# Patient Record
Sex: Female | Born: 1950 | Race: White | Hispanic: No | Marital: Married | Smoking: Former smoker
Health system: Southern US, Community
[De-identification: ages and names within clinical notes are randomized; demographics above are authoritative.]

## PROBLEM LIST (undated history)

## (undated) DIAGNOSIS — J189 Pneumonia, unspecified organism: Secondary | ICD-10-CM

## (undated) DIAGNOSIS — M545 Low back pain, unspecified: Secondary | ICD-10-CM

## (undated) DIAGNOSIS — K429 Umbilical hernia without obstruction or gangrene: Secondary | ICD-10-CM

## (undated) DIAGNOSIS — I82409 Acute embolism and thrombosis of unspecified deep veins of unspecified lower extremity: Secondary | ICD-10-CM

## (undated) DIAGNOSIS — D649 Anemia, unspecified: Secondary | ICD-10-CM

## (undated) DIAGNOSIS — F419 Anxiety disorder, unspecified: Secondary | ICD-10-CM

## (undated) DIAGNOSIS — J309 Allergic rhinitis, unspecified: Secondary | ICD-10-CM

## (undated) DIAGNOSIS — N952 Postmenopausal atrophic vaginitis: Secondary | ICD-10-CM

## (undated) DIAGNOSIS — J449 Chronic obstructive pulmonary disease, unspecified: Secondary | ICD-10-CM

## (undated) DIAGNOSIS — Z9981 Dependence on supplemental oxygen: Secondary | ICD-10-CM

## (undated) DIAGNOSIS — I10 Essential (primary) hypertension: Secondary | ICD-10-CM

## (undated) HISTORY — DX: Essential (primary) hypertension: I10

## (undated) HISTORY — DX: Umbilical hernia without obstruction or gangrene: K42.9

## (undated) HISTORY — PX: TUBAL LIGATION: SHX77

## (undated) HISTORY — DX: Allergic rhinitis, unspecified: J30.9

## (undated) HISTORY — PX: OTHER SURGICAL HISTORY: SHX169

## (undated) HISTORY — DX: Low back pain, unspecified: M54.50

## (undated) HISTORY — DX: Postmenopausal atrophic vaginitis: N95.2

## (undated) HISTORY — DX: Acute embolism and thrombosis of unspecified deep veins of unspecified lower extremity: I82.409

## (undated) HISTORY — DX: Low back pain: M54.5

## (undated) HISTORY — DX: Chronic obstructive pulmonary disease, unspecified: J44.9

---

## 2008-10-22 ENCOUNTER — Ambulatory Visit (HOSPITAL_BASED_OUTPATIENT_CLINIC_OR_DEPARTMENT_OTHER): Admission: RE | Admit: 2008-10-22 | Discharge: 2008-10-22 | Payer: Self-pay | Admitting: Orthopedic Surgery

## 2008-10-22 ENCOUNTER — Encounter (INDEPENDENT_AMBULATORY_CARE_PROVIDER_SITE_OTHER): Payer: Self-pay | Admitting: Orthopedic Surgery

## 2008-10-28 ENCOUNTER — Encounter (INDEPENDENT_AMBULATORY_CARE_PROVIDER_SITE_OTHER): Payer: Self-pay | Admitting: Orthopedic Surgery

## 2008-10-28 ENCOUNTER — Ambulatory Visit: Admission: RE | Admit: 2008-10-28 | Discharge: 2008-10-28 | Payer: Self-pay | Admitting: Orthopedic Surgery

## 2008-10-29 ENCOUNTER — Ambulatory Visit: Payer: Self-pay | Admitting: Vascular Surgery

## 2010-05-06 LAB — POCT HEMOGLOBIN-HEMACUE: Hemoglobin: 17.2 g/dL — ABNORMAL HIGH (ref 12.0–15.0)

## 2011-02-25 ENCOUNTER — Inpatient Hospital Stay: Payer: Self-pay | Admitting: Internal Medicine

## 2011-02-25 LAB — BASIC METABOLIC PANEL
Calcium, Total: 9.6 mg/dL (ref 8.5–10.1)
Chloride: 109 mmol/L — ABNORMAL HIGH (ref 98–107)
Co2: 24 mmol/L (ref 21–32)
Osmolality: 287 (ref 275–301)
Potassium: 4 mmol/L (ref 3.5–5.1)

## 2011-02-25 LAB — CBC WITH DIFFERENTIAL/PLATELET
Basophil %: 0.4 %
Eosinophil #: 0.8 10*3/uL — ABNORMAL HIGH (ref 0.0–0.7)
Eosinophil %: 7.7 %
HGB: 16.3 g/dL — ABNORMAL HIGH (ref 12.0–16.0)
Lymphocyte %: 12.8 %
Monocyte %: 6.2 %
Neutrophil %: 72.9 %
RBC: 5.22 10*6/uL — ABNORMAL HIGH (ref 3.80–5.20)

## 2011-02-26 LAB — CBC WITH DIFFERENTIAL/PLATELET
Eosinophil %: 0 %
Lymphocyte %: 6.4 %
MCH: 31.2 pg (ref 26.0–34.0)
Monocyte #: 0.1 10*3/uL (ref 0.0–0.7)
Neutrophil %: 93 %
Platelet: 238 10*3/uL (ref 150–440)
RBC: 5.05 10*6/uL (ref 3.80–5.20)
WBC: 8.7 10*3/uL (ref 3.6–11.0)

## 2011-02-26 LAB — BASIC METABOLIC PANEL
Anion Gap: 11 (ref 7–16)
Creatinine: 0.58 mg/dL — ABNORMAL LOW (ref 0.60–1.30)
EGFR (African American): >60
EGFR (Non-African Amer.): >60
Osmolality: 292 (ref 275–301)
Sodium: 146 mmol/L — ABNORMAL HIGH (ref 136–145)

## 2014-05-24 NOTE — H&P (Signed)
PATIENT NAME:  Erin Morrison, [name] MR#:  [id number] DATE OF BIRTH:  11-Apr-1950  DATE OF ADMISSION:  02/25/2011  CHIEF COMPLAINT: Dyspnea.  HISTORY OF PRESENT ILLNESS:  Ms. Ashok NorrisFlinchum is a 64 year old white female with a history of chronic obstructive pulmonary disease secondary to ongoing tobacco abuse with PFTs in 2005 who presents with a two-day history of increasing dyspnea. The patient states that she was diagnosed with influenza about three weeks ago by her primary care physician and was treated with prednisone and albuterol but did not take Tamiflu. Her breathing improved on the prednisone, but once the prednisone taper finished she continued to have a cough, at times productive and at times nonproductive. She developed acute shortness of breath yesterday and came to the ER today when the albuterol that she used at home was not alleviating her symptoms. In the ED, she had multiple nebulizer treatments for room air saturations in the mid 80s, and after several albuterol treatments continues to be dyspneic with a 90% oxygen saturation on room air. She is being admitted for more aggressive treatment.   PRIMARY CARE PHYSICIAN:  Her primary care doctor is Dr. Anna Genreonroy in [place].  PAST MEDICAL HISTORY: Notable only for chronic obstructive pulmonary disease. PFTs were done in 2005 by Dr. Adonis Hugueninimeo, which confirmed mild to moderate obstructive airway disease.   MEDICATIONS: Her only medications are Aleve, which she takes occasionally for osteoarthritis.   PAST SURGICAL HISTORY: Only notable for right knee surgery for cystectomy.   ALLERGIES: She has no known drug allergies.  HOSPITALIZATIONS: No recent or prior hospitalizations.   FAMILY HISTORY: Notable for coronary artery disease. Her father dropped dead of a massive heart attack in his 6250s and her mother died of chronic obstructive pulmonary disease.  SOCIAL HISTORY: She is married. She is a lifelong smoker, having quit for three years back in 2005, but  now smoking again 1 pack a day. 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 She does not exercise or drink alcohol.   REVIEW OF SYSTEMS: Positive for fatigue for the last three weeks. No weight changes. No fevers. No recent visual changes. No history of postnasal drip or sinus pain. Positive history of cough with wheezing, dyspnea, and history of chronic obstructive pulmonary disease. She denies chest pain, orthopnea, edema, and dyspnea on exertion. No palpitations or syncope. No history of hypertension. No nausea, vomiting, diarrhea, or bowel habit changes. She has polyuria, 1 to 2 voids per night. No history of anemia or easy bruising. No chronic arthritis-type pain. She has no history of numbness, weakness, or dysarthria. No history of migraines, cerebrovascular accidents, or transient ischemic attacks. She has no history of anxiety, insomnia, or depression.   PHYSICAL EXAMINATION:  GENERAL: This is a middle-aged woman in no apparent distress who is slightly dyspneic at rest. Not using any accessory muscles to breathe.   VITAL SIGNS: Initial blood pressure is 197/97, repeat was 172/78, pulse 82. Rhythm is regular. She is sating 90% currently on room air. Her temperature is 98.3.   HEENT: Pupils are equal, round, and reactive to light. Extraocular movements are intact. Sclerae are anicteric.  Oropharynx is benign.   NECK: Supple without lymphadenopathy, JVD, thyromegaly, or carotid bruits.   LUNGS: Notable for decreased breath sounds bilaterally in all fields with occasional expiratory wheezes. No egophony is noted.   CARDIOVASCULAR: Regular rate and rhythm with no murmurs, rubs, or gallops. She has palpable pedal pulses and no lower extremity edema.   ABDOMEN: Soft, nontender, and  nondistended with good bowel sounds and no evidence of hepatosplenomegaly.   MUSCULOSKELETAL: Nonfocal. She has no joint effusions and good range of motion.   SKIN: Skin is warm and dry without  lesions.   LYMPH: There is no cervical, axillary, inguinal, or supraclavicular lymphadenopathy.   NEUROLOGIC: Grossly nonfocal.   PSYCH: She is alert and oriented to person, place, and time.   ADMISSION LABORATORY DATA: Sodium 144, potassium 4, chloride 109, bicarbonate 24, BUN 12, creatinine 0.75, glucose 101, white count 10.4, hemoglobin 16.3, platelets 251. Twelve-lead EKG is normal sinus rhythm. Chest x-ray is negative for infiltrate.   ASSESSMENT AND PLAN: 1. Hypoxic respiratory failure secondary to chronic obstructive pulmonary disease exacerbation, likely brought on by recent influenza. Admit for IV Solu-Medrol, bronchodilators, and empiric antibiotics.  2. Hypertension, new onset. We will follow blood pressure for now as she has no history of hypertension. This may be due to bronchodilators versus steroids that were given here.  3. Chronic obstructive pulmonary disease with ongoing tobacco use. The patient continues to smoke due to stressors. She was counseled during the ER visit about the need to quit smoking given her chronic obstructive pulmonary disease. She is in the contemplative stages.  4. CODE STATUS: She is FULL CODE.  TIME SPENT: Approximately 40 minutes.    ____________________________ Duncan Dull, MD tt:bjt D: 02/25/2011 23:22:02 ET T: 02/26/2011 10:53:28 ET JOB#: [id number]  cc: Duncan Dull, MD, <Dictator> Lonie Peak, MD Duncan Dull MD ELECTRONICALLY SIGNED 03/03/2011 18:22

## 2014-05-24 NOTE — Consult Note (Signed)
Chief Complaint:   Subjective/Chief Complaint dyspnea   Assessment/Plan:  Assessment/Plan:   Assessment 64 yo with recent diagnosis of influenza admitted with persistent dyspnea and hypoxia after multiple bronchodilator treatments.   1) Acute resp failure:  02, IV solumedrol, ceftriazoner/azithro, bronchodilators.  2) Hypertension: new onset.  amlodipine   Electronic Signatures: Duncan Dullullo, Charlot Gouin (MD)  (Signed 26-Jan-13 23:26)  Authored: Chief Complaint, Assessment/Plan   Last Updated: 26-Jan-13 23:26 by Duncan Dullullo, Ozan Maclay (MD)

## 2014-05-24 NOTE — Discharge Summary (Signed)
PATIENT NAME:  Erin Morrison, Erin Morrison MR#:  [id number] DATE OF BIRTH:  01-Jul-1950  DATE OF ADMISSION:  02/25/2011 DATE OF DISCHARGE:  02/27/2011  DISCHARGE DIAGNOSES: 1. Acute bronchitis. 2. Chronic obstructive pulmonary disease exacerbation.  3. Tobacco abuse.  4. New onset hypertension.   DISCHARGE MEDICATIONS:  1. Spiriva one capsule inhalation daily.  2. Amlodipine 5 mg p.o. daily.  3. Albuterol MDI 2 puffs q.6 hours p.r.n. for wheezing.  4. Prednisone Dosepak.  5. Levaquin 750 mg p.o. daily for two days.  OXYGEN: 2 liters via nasal cannula.    DIET: Low sodium diet.   CONSULTATIONS: None.   HOSPITAL COURSE: The patient is a 64 year old female patient who has been seeing Dr. Anna Genreonroy in [place] came in because of trouble breathing. The patient had the flu three weeks before and was treated with prednisone and antibiotics. The patient did not take Tamiflu. The patient was admitted with hypoxia with sats of around 80%. After several breathing treatments, the patient's sats remained 80%. The patient remained quite dyspneic with sats of around 90 after several breathing treatments. She was admitted for chronic obstructive pulmonary disease exacerbation with acute bronchitis. Chest x-ray did not show any pneumonia, last look fine. The patient's EKG showed normal sinus rhythm. She was started on IV Solu-Medrol along with Levaquin, bronchodilators, and the patient showed considerable improvement in her symptoms and her wheezing has also decreased. The patient's chest x-ray did not show any acute infiltrate. Her electrolytes were within normal limits and white count was 10.4. The patient has been on steroids which helped her a lot. Today she wanted to go home and so I gave a prescription for steroids along with inhaler and also Spiriva. The patient's oxygen saturation dropped to 87% on exertion on room air and 92% on room air at rest so she is going to be discharged with oxygen via nasal cannula all the  time at 2 liters and follow-up with Dr. Anna Genreonroy in [place] in 1 to 2 weeks regarding further continuation of the oxygen. The patient had no history of high blood pressure before but in the hospital has been around 170/78 so she was started on Norvasc and this morning her blood pressure is 118/74 with heart rate of 80 so she was given prescription for Norvasc as well.   TOTAL TIME SPENT ON DISCHARGE PREPARATION: More than 30 minutes.  ____________________________ Katha HammingSnehalatha Chianti Goh, MD sk:drc D: 02/27/2011 13:54:08 ET T: 02/27/2011 14:07:56 ET JOB#: [id number]  cc: Katha HammingSnehalatha Abagael Kramm, MD, <Dictator> Lonie PeakNathan Conroy, MD Katha HammingSNEHALATHA Ladonte Verstraete MD ELECTRONICALLY SIGNED 03/14/2011 14:53

## 2015-06-08 ENCOUNTER — Other Ambulatory Visit (INDEPENDENT_AMBULATORY_CARE_PROVIDER_SITE_OTHER): Payer: 59

## 2015-06-08 ENCOUNTER — Telehealth: Payer: Self-pay | Admitting: Pulmonary Disease

## 2015-06-08 ENCOUNTER — Encounter: Payer: Self-pay | Admitting: Pulmonary Disease

## 2015-06-08 ENCOUNTER — Ambulatory Visit (INDEPENDENT_AMBULATORY_CARE_PROVIDER_SITE_OTHER): Payer: Managed Care, Other (non HMO) | Admitting: Pulmonary Disease

## 2015-06-08 VITALS — BP 140/86 | HR 86 | Ht 62.0 in | Wt 169.2 lb

## 2015-06-08 DIAGNOSIS — I10 Essential (primary) hypertension: Secondary | ICD-10-CM

## 2015-06-08 DIAGNOSIS — J410 Simple chronic bronchitis: Secondary | ICD-10-CM

## 2015-06-08 DIAGNOSIS — Z92241 Personal history of systemic steroid therapy: Secondary | ICD-10-CM

## 2015-06-08 DIAGNOSIS — J3089 Other allergic rhinitis: Secondary | ICD-10-CM | POA: Diagnosis not present

## 2015-06-08 DIAGNOSIS — J449 Chronic obstructive pulmonary disease, unspecified: Secondary | ICD-10-CM

## 2015-06-08 DIAGNOSIS — J309 Allergic rhinitis, unspecified: Secondary | ICD-10-CM | POA: Insufficient documentation

## 2015-06-08 DIAGNOSIS — R0789 Other chest pain: Secondary | ICD-10-CM

## 2015-06-08 DIAGNOSIS — J42 Unspecified chronic bronchitis: Secondary | ICD-10-CM | POA: Insufficient documentation

## 2015-06-08 HISTORY — DX: Essential (primary) hypertension: I10

## 2015-06-08 HISTORY — DX: Personal history of systemic steroid therapy: Z92.241

## 2015-06-08 HISTORY — DX: Chronic obstructive pulmonary disease, unspecified: J44.9

## 2015-06-08 LAB — CBC
HEMATOCRIT: 47.4 % — AB (ref 36.0–46.0)
HEMOGLOBIN: 16 g/dL — AB (ref 12.0–15.0)
MCHC: 33.8 g/dL (ref 30.0–36.0)
MCV: 91.1 fl (ref 78.0–100.0)
Platelets: 348 10*3/uL (ref 150.0–400.0)
RBC: 5.21 Mil/uL — ABNORMAL HIGH (ref 3.87–5.11)
RDW: 13.7 % (ref 11.5–15.5)
WBC: 11.9 10*3/uL — AB (ref 4.0–10.5)

## 2015-06-08 LAB — TROPONIN I: TNIDX: 0 ug/L (ref 0.00–0.06)

## 2015-06-08 MED ORDER — AEROCHAMBER MV MISC: Status: DC

## 2015-06-08 MED ORDER — MONTELUKAST SODIUM 10 MG PO TABS: 10.0000 mg | ORAL_TABLET | Freq: Every day | ORAL | Status: DC

## 2015-06-08 NOTE — Patient Instructions (Signed)
   Continue using your Symbicort & Spiriva as prescribed. Remember to use your spacer with your Symbicort.  We will review your test results at your next appointment.  Seek immediate medical attention if your chest pain does not resolve or worsens in anyway.  I will see you back in 4-6 weeks.  TESTS ORDERED: 1. Full pulmonary function testing before next appointment 2. 6 minute walk test on room air before next appointment 3. Serum lab work today

## 2015-06-08 NOTE — Progress Notes (Signed)
Subjective:    Patient ID: Erin Morrison, female    DOB: 1951/01/09, 65 y.o.   MRN: [id number]  HPI Reports she was diagnosed with COPD in 2007. She has been on Symbicort & Spiriva since 2012. She feels they may be helping her breathing some. She uses her rescue inhaler 3-4 times daily lately. She reports she has a cough primarily in the morning and it is productive of a clear to yellow mucus. She does have intermittent wheezing. No nocturnal awakenings with coughing or wheezing. She reports second-hand smoke, fresh cut grass, and chemical fumes will make her breathing worse. Denies any sinus drainage but does have congestion. She reports chronic chest pain. She describes the pain as a "pressure" on her chest. Denies any pleurisy. It can occur at rest as well as with exertion. Denies any recurrent dyspepsia or reflux. No morning brash water taste. No syncope or near syncope. Occasional headaches, especially in the morning. She has been on Prednisone since September off an on but she has "chest tightness" as she starts to wean off the Prednisone. She gets frequent bronchitis.  Review of Systems No rashes or bruising. No dysuria or hematuria. A pertinent 14 point review of systems is negative except as per the history of presenting illness.  No Known Allergies  Current Outpatient Prescriptions on File Prior to Visit  Medication Sig Dispense Refill  . albuterol (PROVENTIL HFA;VENTOLIN HFA) 108 (90 Base) MCG/ACT inhaler Inhale into the lungs every 6 (six) hours as needed for wheezing or shortness of breath.    Marland Kitchen albuterol (PROVENTIL) (2.5 MG/3ML) 0.083% nebulizer solution Take 2.5 mg by nebulization every 6 (six) hours as needed for wheezing or shortness of breath.    Marland Kitchen aspirin 81 MG tablet Take 81 mg by mouth daily.    . budesonide-formoterol (SYMBICORT) 160-4.5 MCG/ACT inhaler Inhale 2 puffs into the lungs 2 (two) times daily.    . cetirizine (ZYRTEC) 10 MG tablet Take 10 mg by mouth daily.    Marland Kitchen  esomeprazole (NEXIUM) 40 MG capsule Take 40 mg by mouth daily at 12 noon.    . tiotropium (SPIRIVA) 18 MCG inhalation capsule Place 18 mcg into inhaler and inhale daily.     No current facility-administered medications on file prior to visit.    Past Medical History  Diagnosis Date  . COPD (chronic obstructive pulmonary disease) (HCC)   . Hypertension   . Low back pain   . Umbilical hernia   . Acute DVT (deep venous thrombosis) (HCC)   . Allergic rhinitis   . Atrophic vaginitis     Past Surgical History  Procedure Laterality Date  . Arthroscopic knee surgery Right   . Tubal ligation      Family History  Problem Relation Age of Onset  . Emphysema Mother   . Cirrhosis Father     Social History   Social History  . Marital Status: Married    Spouse Name: N/A  . Number of Children: N/A  . Years of Education: N/A   Occupational History  . Retired    Social History Main Topics  . Smoking status: Former Smoker -- 1.50 packs/day for 46 years    Types: Cigarettes    Start date: 01/31/1964    Quit date: 01/30/2010  . Smokeless tobacco: Never Used  . Alcohol Use: No  . Drug Use: No  . Sexual Activity: Not Asked   Other Topics Concern  . None   Social History Narrative   [facility]:  Originally from [place] Moved to [place] in 1986. Has [place] to [place] 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 Has an outside dog. No bird or mold exposure. She does have a musty smell in her bedroom.       Objective:   Physical Exam BP 140/86 mmHg  Pulse 86  Ht 5\' 2"  (1.575 m)  Wt 169 lb 3.2 oz (76.749 kg)  BMI 30.94 kg/m2  SpO2 92% General:  Awake. Alert. No acute distress. Integument:  Warm & dry. No rash on exposed skin. No bruising. Lymphatics:  No appreciated cervical or supraclavicular lymphadenoapthy. HEENT:  Moist mucus membranes. No oral ulcers. No scleral injection or icterus. Minimal nasal turbinate  swelling. Cardiovascular:  Regular rate. No edema. No appreciable JVD.  Pulmonary:  Good aeration & clear to auscultation bilaterally. Symmetric chest wall expansion. No accessory muscle use. Abdomen: Soft. Normal bowel sounds. Nondistended. Grossly nontender. Musculoskeletal:  Normal bulk and tone. Hand grip strength 5/5 bilaterally. No joint deformity or effusion appreciated. Neurological:  CN 2-12 grossly in tact. No meningismus. Moving all 4 extremities equally. Symmetric brachioradialis deep tendon reflexes. Psychiatric:  Mood and affect congruent. Speech normal rhythm, rate & tone.   IMAGING CXR PA/LAT 02/22/15 (per radiologist): Bibasilar scarring or atelectasis. Heart normal in size. No effusion or bony abnormality.  CXR PA/LAT 02/25/11 (per radiologist): Lungs are clear with cardiac silhouette & visualized bony skeleton unremarkable. No evidence of acute cardiopulmonary disease.   CARDIAC EKG 06/08/15 (personally reviewed by me): Normal sinus rhythm. QTC 406 ms. Upright T waves & no ST segment depression to suggest ischemia.  LABS 02/26/11 CBC: 8.7/15.8/47.2/238 BMP: 146/3.7/112/23/12/0.58/137/9.3    Assessment & Plan:  65 year old female remotely diagnosed with COPD. With her history of recurrent bronchitis I do question whether or not she may have an underlying immune deficiency. Certainly her ongoing chest discomfort is concerning for possible cardiac etiology with her long-standing history of tobacco use. This will need to be investigated further with serum lab work. I do believe there may be some allergic component to her symptoms with her history of allergic rhinitis. I will attempt to increase treatment for allergic rhinitis as well as further investigate her pulmonary and immune function before further adjusting her inhaled medications at this time. I instructed the patient contact my office if she had any new breathing problems or questions before next appointment.  1. COPD:  Checking for pulmonary function testing & 6 minute walk test before next appointment. Continuing patient on Symbicort & Spiriva as prescribed. Providing patient with a spacer to use with Symbicort. Screening for alpha-1 antitrypsin deficiency. 2. Chronic bronchitis: Checking quantitative immunoglobulin panel. Also checking full pulmonary function testing before next appointment. 3. History of steroid therapy: Continuing on current dose of prednisone 10 mg by mouth daily. Plan to readdress need for prednisone at next appointment depending upon test results. 4. Allergic rhinitis: Starting singular 10 mg by mouth daily at bedtime. Checking serum IgE & RAST panel. Also checking serum CBC. 5. Atypical chest pain: Checking troponin I. Defer to primary care physician on further workup if this is negative. 6. Follow-up: Patient to return to clinic in 4-6 weeks.  Donna ChristenJennings E. Jamison NeighborNestor, M.D. [facility] Pager:  [phone] After 3pm or if no response, call [phone] 12:07 PM 06/08/2015

## 2015-06-08 NOTE — Telephone Encounter (Signed)
IMAGING CXR PA/LAT 02/25/11 (per radiologist): Lungs are clear with cardiac silhouette & visualized bony skeleton unremarkable. No evidence of acute cardiopulmonary disease.   LABS 02/26/11 CBC: 8.7/15.8/47.2/238 BMP: 146/3.7/112/23/12/0.58/137/9.3

## 2015-06-09 LAB — RESPIRATORY ALLERGY PROFILE REGION II ~~LOC~~
Allergen, Cedar tree, t12: <0.1 kU/L
Allergen, D pternoyssinus,d7: <0.1 kU/L
Allergen, Mouse Urine Protein, e78: <0.1 kU/L
Allergen, Oak,t7: <0.1 kU/L
Box Elder IgE: <0.1 kU/L
Cat Dander: <0.1 kU/L
D. farinae: <0.1 kU/L
Dog Dander: <0.1 kU/L
Elm IgE: <0.1 kU/L
IGE (IMMUNOGLOBULIN E), SERUM: 15 kU/L (ref <115)
Johnson Grass: <0.1 kU/L
Rough Pigweed  IgE: <0.1 kU/L
Sheep Sorrel IgE: <0.1 kU/L

## 2015-06-09 LAB — IGG, IGA, IGM
IgA: 248 mg/dL (ref 69–380)
IgG (Immunoglobin G), Serum: 648 mg/dL — ABNORMAL LOW (ref 690–1700)
IgM, Serum: 126 mg/dL (ref 52–322)

## 2015-06-12 LAB — ALPHA-1 ANTITRYPSIN PHENOTYPE: A1 ANTITRYPSIN: 169 mg/dL (ref 83–199)

## 2015-06-15 NOTE — Progress Notes (Signed)
Quick Note:  Attempted to call pt. Phone rang with no VM. ______

## 2015-06-23 ENCOUNTER — Encounter: Payer: Self-pay | Admitting: *Deleted

## 2015-06-30 ENCOUNTER — Telehealth: Payer: Self-pay | Admitting: Pulmonary Disease

## 2015-06-30 NOTE — Telephone Encounter (Signed)
Results have been explained to patient, pt expressed understanding. Nothing further needed.  Notes Recorded by Roslynn AmbleJennings E Nestor, MD on 06/08/2015 at 4:20 PM Please call the patient and let her know that her blood work does not show any anemia or damage to her heart at this time. Thanks.

## 2015-07-13 ENCOUNTER — Ambulatory Visit (HOSPITAL_COMMUNITY)
Admission: RE | Admit: 2015-07-13 | Discharge: 2015-07-13 | Disposition: A | Discharge status: Home / Self Care | Payer: Managed Care, Other (non HMO) | Source: Ambulatory Visit | Attending: Pulmonary Disease | Admitting: Pulmonary Disease

## 2015-07-13 DIAGNOSIS — J410 Simple chronic bronchitis: Secondary | ICD-10-CM

## 2015-07-13 DIAGNOSIS — Z92241 Personal history of systemic steroid therapy: Secondary | ICD-10-CM

## 2015-07-13 DIAGNOSIS — J449 Chronic obstructive pulmonary disease, unspecified: Secondary | ICD-10-CM | POA: Diagnosis present

## 2015-07-13 DIAGNOSIS — J3089 Other allergic rhinitis: Secondary | ICD-10-CM | POA: Diagnosis present

## 2015-07-13 LAB — PULMONARY FUNCTION TEST
DL/VA % pred: 69 %
DL/VA: 3.17 ml/min/mmHg/L
DLCO UNC % PRED: 62 %
DLCO UNC: 13.52 ml/min/mmHg
FEF 25-75 PRE: 0.92 L/s
FEF 25-75 Post: 0.97 L/sec
FEF2575-%Change-Post: 5 %
FEF2575-%PRED-PRE: 47 %
FEF2575-%Pred-Post: 50 %
FEV1-%CHANGE-POST: 1 %
FEV1-%PRED-POST: 67 %
FEV1-%Pred-Pre: 66 %
FEV1-POST: 1.46 L
FEV1-Pre: 1.45 L
FEV1FVC-%CHANGE-POST: -9 %
FEV1FVC-%Pred-Pre: 90 %
FEV6-%Change-Post: 8 %
FEV6-%PRED-PRE: 76 %
FEV6-%Pred-Post: 83 %
FEV6-PRE: 2.09 L
FEV6-Post: 2.27 L
FEV6FVC-%CHANGE-POST: -2 %
FEV6FVC-%PRED-PRE: 104 %
FEV6FVC-%Pred-Post: 101 %
FVC-%Change-Post: 12 %
FVC-%Pred-Post: 82 %
FVC-%Pred-Pre: 73 %
FVC-PRE: 2.09 L
FVC-Post: 2.34 L
POST FEV1/FVC RATIO: 63 %
POST FEV6/FVC RATIO: 97 %
Pre FEV1/FVC ratio: 69 %
Pre FEV6/FVC Ratio: 100 %
RV % PRED: 153 %
RV: 3.12 L
TLC % pred: 117 %
TLC: 5.56 L

## 2015-07-13 MED ORDER — ALBUTEROL SULFATE (2.5 MG/3ML) 0.083% IN NEBU
2.5000 mg | INHALATION_SOLUTION | Freq: Once | RESPIRATORY_TRACT | Status: AC
  Administered 2015-07-13: 2.5 mg via RESPIRATORY_TRACT

## 2015-07-21 ENCOUNTER — Ambulatory Visit (INDEPENDENT_AMBULATORY_CARE_PROVIDER_SITE_OTHER): Payer: Managed Care, Other (non HMO) | Admitting: Pulmonary Disease

## 2015-07-21 ENCOUNTER — Encounter: Payer: Self-pay | Admitting: Pulmonary Disease

## 2015-07-21 VITALS — BP 124/66 | HR 84 | Ht 62.0 in | Wt 172.8 lb

## 2015-07-21 DIAGNOSIS — J449 Chronic obstructive pulmonary disease, unspecified: Secondary | ICD-10-CM

## 2015-07-21 DIAGNOSIS — R05 Cough: Secondary | ICD-10-CM | POA: Diagnosis not present

## 2015-07-21 DIAGNOSIS — Z92241 Personal history of systemic steroid therapy: Secondary | ICD-10-CM | POA: Diagnosis not present

## 2015-07-21 DIAGNOSIS — R059 Cough, unspecified: Secondary | ICD-10-CM

## 2015-07-21 DIAGNOSIS — J309 Allergic rhinitis, unspecified: Secondary | ICD-10-CM

## 2015-07-21 MED ORDER — DOXYCYCLINE HYCLATE 100 MG PO TABS: 100.0000 mg | ORAL_TABLET | Freq: Two times a day (BID) | ORAL | Status: DC

## 2015-07-21 NOTE — Progress Notes (Signed)
PFT 07/13/15: FVC 2.09 L (73%) FEV1 1.45 L (66%) FEV1/FVC 0.69 FEF 25-75 0.92 L (47%) positive bronchodilator response TLC 5.56 liters (117%) RV 153% ERV 37% DLCO uncorrected 62%  6MWT 07/21/15:  Walked 372 meters / Baseline Sat 94% on RA / Nadir Sat 94% on RA  LABS 06/08/15 Tropoinin I:  0.00 IgG: 648 IgA: 248 IgM: 126 IgE: 15 RAST Panel: Negative Alpha-1 antitrypsin: MM (169) CBC: 11.9/16.0/47.4/348

## 2015-07-21 NOTE — Progress Notes (Signed)
Subjective:    Patient ID: Erin Morrison, female    DOB: Aug 18, 1950, 65 y.o.   MRN: [id number]  C.C.:  Follow-up for Moderate COPD, Chronic Steroid Therapy, & Allergic Rhinitis.  HPI Moderate COPD: Patient on Symbicort & Spiriva. Patient provided a spacer to use with her Symbicort at last appointment. Since last appointment she has been producing more phlegm. She reports starting Friday she has produced yellow to clear phlegm. She reports 3 tablespoons daily. She has been wheezing more. She reports intermittent dypspea. She is using her rescue inhaler 1-2 times daily and feels it may help. She is doing a nebulizer treatment twice daily and does feel this helps as well.   Chronic Steroid Therapy: Patient on prednisone 10 mg by mouth daily. Patient has been on and off of prednisone since September 2016.  Allergic Rhinitis:  Started on Singulair at last appointment. She does feel it helped her sinuses. Also taking Zyrtec. She reports she is sneezing more. She does have post nasal drainage. She has also noticed more sinus congestion.   Review of Systems No fever, chills, or sweats. No chest pain, tightness, or pressure. No nausea, emesis, or abdominal pain except with coughing.   No Known Allergies  Current Outpatient Prescriptions on File Prior to Visit  Medication Sig Dispense Refill  . albuterol (PROVENTIL HFA;VENTOLIN HFA) 108 (90 Base) MCG/ACT inhaler Inhale into the lungs every 6 (six) hours as needed for wheezing or shortness of breath.    Marland Kitchen albuterol (PROVENTIL) (2.5 MG/3ML) 0.083% nebulizer solution Take 2.5 mg by nebulization every 6 (six) hours as needed for wheezing or shortness of breath.    Marland Kitchen aspirin 81 MG tablet Take 81 mg by mouth daily.    . budesonide-formoterol (SYMBICORT) 160-4.5 MCG/ACT inhaler Inhale 2 puffs into the lungs 2 (two) times daily.    . cetirizine (ZYRTEC) 10 MG tablet Take 10 mg by mouth daily.    Marland Kitchen esomeprazole (NEXIUM) 40 MG capsule Take 40 mg by mouth  daily at 12 noon.    . montelukast (SINGULAIR) 10 MG tablet Take 1 tablet (10 mg total) by mouth at bedtime. 30 tablet 3  . predniSONE (DELTASONE) 10 MG tablet Take 10 mg by mouth daily.  0  . Spacer/Aero-Holding Chambers (AEROCHAMBER MV) inhaler Use as instructed 1 each 0  . tiotropium (SPIRIVA) 18 MCG inhalation capsule Place 18 mcg into inhaler and inhale daily.     No current facility-administered medications on file prior to visit.    Past Medical History  Diagnosis Date  . COPD (chronic obstructive pulmonary disease) (HCC)   . Hypertension   . Low back pain   . Umbilical hernia   . Acute DVT (deep venous thrombosis) (HCC)   . Allergic rhinitis   . Atrophic vaginitis     Past Surgical History  Procedure Laterality Date  . Arthroscopic knee surgery Right   . Tubal ligation      Family History  Problem Relation Age of Onset  . Emphysema Mother   . Cirrhosis Father     Social History   Social History  . Marital Status: Married    Spouse Name: N/A  . Number of Children: N/A  . Years of Education: N/A   Occupational History  . Retired    Social History Main Topics  . Smoking status: Former Smoker -- 1.50 packs/day for 46 years    Types: Cigarettes    Start date: 01/31/1964    Quit date: 01/30/2010  . Smokeless  tobacco: Never Used  . Alcohol Use: No  . Drug Use: No  . Sexual Activity: Not Asked   Other Topics Concern  . None   Social History Narrative   [facility]:   Originally from [place] Moved to [place] in 1986. [place] [facility] [place]. 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 Has an outside dog. No bird or mold exposure. She does have a musty smell in her bedroom.       Objective:   Physical Exam BP 124/66 mmHg  Pulse 84  Ht 5\' 2"  (1.575 m)  Wt 172 lb 12.8 oz (78.382 kg)  BMI 31.60 kg/m2  SpO2 93% General:  Awake. Alert. No distress. Integument:  Warm & dry. No rash on exposed skin.  Lymphatics:  No  appreciated cervical or supraclavicular lymphadenoapthy. HEENT:  Moist mucus membranes. No oral ulcers. Mild bilateral nasal turbinate swelling. Cardiovascular:  Regular rate. No edema. Normal S1 & S2.  Pulmonary:  Good aeration bilaterally. Coarse end expiratory wheeze. Speaking in complete sentences. Abdomen: Soft. Normal bowel sounds. Nontender.  PFT 07/13/15: FVC 2.09 L (73%) FEV1 1.45 L (66%) FEV1/FVC 0.69 FEF 25-75 0.92 L (47%) positive bronchodilator response TLC 5.56 liters (117%) RV 153% ERV 37% DLCO uncorrected 62%  6MWT 07/21/15: Walked 372 meters / Baseline Sat 94% on RA / Nadir Sat 94% on RA  IMAGING CXR PA/LAT 02/22/15 (per radiologist): Bibasilar scarring or atelectasis. Heart normal in size. No effusion or bony abnormality.  CXR PA/LAT 02/25/11 (per radiologist): Lungs are clear with cardiac silhouette & visualized bony skeleton unremarkable. No evidence of acute cardiopulmonary disease.   CARDIAC EKG 06/08/15 (previously reviewed by me): Normal sinus rhythm. QTC 406 ms. Upright T waves & no ST segment depression to suggest ischemia.  LABS 06/08/15 Tropoinin I: 0.00 IgG: 648 IgA: 248 IgM: 126 IgE: 15 RAST Panel: Negative Alpha-1 antitrypsin: MM (169) CBC: 11.9/16.0/47.4/348  02/26/11 CBC: 8.7/15.8/47.2/238 BMP: 146/3.7/112/23/12/0.58/137/9.3    Assessment & Plan:  65 year old female with moderate COPD, chronic steroid therapy, & allergic rhinitis.patient's spirometry shows a significant bronchodilator response today raising the question of adequate drug delivery. I do feel that her cough today is likely multifactorial but given the color and volume changes I feel that a short course of antibiotic therapy would be of benefit. I do feel the patient needs to wean her steroid therapy and we will readdress this at follow-up appointment as she clinically improves. I'm also trying the patient on the Spiriva Respimat device to determine whether or not there may be some  improvement in drug delivery over powder medication to maximize her bronchodilation.  1. Cough: Checking sputum culture for AFB, fungus, and bacteria. Empiric doxycycline 100 mg by mouth twice a day 7 days. Consider ran his own 40 mg by mouth daily 4 days if cough persists or symptoms worsen. 2. Moderate COPD: Continuing Symbicort. Patient given sample of Spiriva Respimat to try. 3. Chronic Steroid Therapy: Continuing prednisone 10 mg by mouth daily. Plan to discuss prednisone taper at follow-up appointment. 4. Allergic rhinitis: Continuing Singulair. No changes. 5. Health Maintenance: Influenza vaccine October 2016 & Pneumovax January 2013. Plan for Prevnar Vaccine at next appointment. 6. Follow-up: Patient to return to clinic in 2-4 weeks.  Donna ChristenJennings E. Jamison NeighborNestor, M.D. [facility] Pager:  [phone] After 3pm or if no response, call [phone] 2:25 PM 07/21/2015

## 2015-07-21 NOTE — Patient Instructions (Addendum)
   Take your antibiotic with a full glass of water and remain upright for 1 hour afterward. Avoid taking the antibiotic with any dairy products as this will make it ineffective. Also avoid excessive sunlight as this will raise your risk of sunburn.  Use the Spiriva Respimat sample in place of your HandiHaler to see if this device works better for you. Inhale 2 puffs once daily.  Call me if your cough or breathing gets worse as we would then increase your prednisone for the short-term.  We will see you back in 2-4 weeks to see how you're doing.  TESTS ORDERED: 1. Sputum culture for AFB, fungus, and bacteria.

## 2015-07-22 ENCOUNTER — Other Ambulatory Visit: Payer: Self-pay | Admitting: Pulmonary Disease

## 2015-07-22 ENCOUNTER — Other Ambulatory Visit: Payer: Managed Care, Other (non HMO)

## 2015-07-22 DIAGNOSIS — R05 Cough: Secondary | ICD-10-CM

## 2015-07-22 DIAGNOSIS — J449 Chronic obstructive pulmonary disease, unspecified: Secondary | ICD-10-CM

## 2015-07-22 DIAGNOSIS — R059 Cough, unspecified: Secondary | ICD-10-CM

## 2015-07-25 LAB — RESPIRATORY CULTURE OR RESPIRATORY AND SPUTUM CULTURE: ORGANISM ID, BACTERIA: NORMAL

## 2015-07-26 ENCOUNTER — Telehealth: Payer: Self-pay | Admitting: Pulmonary Disease

## 2015-07-26 MED ORDER — TIOTROPIUM BROMIDE MONOHYDRATE 2.5 MCG/ACT IN AERS: 2.0000 | INHALATION_SPRAY | Freq: Every day | RESPIRATORY_TRACT | Status: DC

## 2015-07-26 NOTE — Telephone Encounter (Signed)
Patient returning call-prm  °

## 2015-07-26 NOTE — Telephone Encounter (Signed)
Pt called back [phone]

## 2015-07-26 NOTE — Telephone Encounter (Signed)
Spoke with pt. She needs a prescription sent in for Spiriva Respimat. Pt was given a sample of this at the last OV. Rx has been sent in. Nothing further was needed.

## 2015-07-26 NOTE — Telephone Encounter (Signed)
lmtcb x1 for pt. 

## 2015-08-16 ENCOUNTER — Ambulatory Visit (INDEPENDENT_AMBULATORY_CARE_PROVIDER_SITE_OTHER): Payer: Managed Care, Other (non HMO) | Admitting: Adult Health

## 2015-08-16 ENCOUNTER — Encounter: Payer: Self-pay | Admitting: Adult Health

## 2015-08-16 VITALS — BP 124/70 | HR 63 | Temp 97.8°F | Ht 62.0 in | Wt 173.0 lb

## 2015-08-16 DIAGNOSIS — J449 Chronic obstructive pulmonary disease, unspecified: Secondary | ICD-10-CM | POA: Diagnosis not present

## 2015-08-16 NOTE — Patient Instructions (Signed)
Finish Doxycycline and Prednisone as directed.  Mucinex DM Twice daily  As needed  Cough/congestion  Follow up with  Dr. Jamison NeighborNestor in 2-3 months and As needed   Please contact office for sooner follow up if symptoms do not improve or worsen or seek emergency care

## 2015-08-16 NOTE — Progress Notes (Signed)
Note reviewed.  Donna ChristenJennings E. Jamison NeighborNestor, M.D. [facility] Pager:  [phone] After 3pm or if no response, call [phone] 3:39 PM 08/16/2015

## 2015-08-16 NOTE — Progress Notes (Signed)
Subjective:    Patient ID: Erin Morrison, female    DOB: Jun 22, 1950, 65 y.o.   MRN: [id number]  HPI 65 year old female with moderate COPD , former smoker  TEST  PFT 07/13/15: FVC 2.09 L (73%) FEV1 1.45 L (66%) FEV1/FVC 0.69 FEF 25-75 0.92 L (47%) positive bronchodilator response TLC 5.56 liters (117%) RV 153% ERV 37% DLCO uncorrected 62%  07/21/15: Walked 372 meters / Baseline Sat 94% on RA / Nadir Sat 94% on RA  IMAGING CXR PA/LAT 02/22/15 (per radiologist): Bibasilar scarring or atelectasis. Heart normal in size. No effusion or bony abnormality.  CXR PA/LAT 02/25/11 (per radiologist): Lungs are clear with cardiac silhouette & visualized bony skeleton unremarkable. No evidence of acute cardiopulmonary disease.   CARDIAC EKG 06/08/15 (previously reviewed by me): Normal sinus rhythm. QTC 406 ms. Upright T waves & no ST segment depression to suggest ischemia.  LABS 06/08/15 Tropoinin I: 0.00 IgG: 648 IgA: 248 IgM: 126 IgE: 15 RAST Panel: Negative Alpha-1 antitrypsin: MM (169) CBC: 11.9/16.0/47.4/348   08/16/2015 follow-up COPD Patient presents for a one-month follow-up. Patient was seen last visit with a COPD exacerbation and was treated with antibiotics and steroid burst. She did improve briefly but over the last week it had increased cough and congestion. She was started on doxycycline and a prednisone taper by her primary care physician. She is starting to feel better. Cough and congestion have decreased and is now clear to white mucus.Denies any SOB/Wheezing fever, nausea or vomiting.  She remains on Symbicort and Spiriva. She has been on and off of prednisone since September 2016.  Sputum cx for fungal/AFB and gram stain were neg (prelim ) last ov.    Past Medical History  Diagnosis Date  . COPD (chronic obstructive pulmonary disease) (HCC)   . Hypertension   . Low back pain   . Umbilical hernia   . Acute DVT (deep venous thrombosis) (HCC)   . Allergic  rhinitis   . Atrophic vaginitis    Current Outpatient Prescriptions on File Prior to Visit  Medication Sig Dispense Refill  . albuterol (PROVENTIL HFA;VENTOLIN HFA) 108 (90 Base) MCG/ACT inhaler Inhale into the lungs every 6 (six) hours as needed for wheezing or shortness of breath.    Marland Kitchen albuterol (PROVENTIL) (2.5 MG/3ML) 0.083% nebulizer solution Take 2.5 mg by nebulization every 6 (six) hours as needed for wheezing or shortness of breath.    Marland Kitchen aspirin 81 MG tablet Take 81 mg by mouth daily.    . budesonide-formoterol (SYMBICORT) 160-4.5 MCG/ACT inhaler Inhale 2 puffs into the lungs 2 (two) times daily.    . cetirizine (ZYRTEC) 10 MG tablet Take 10 mg by mouth daily.    Marland Kitchen doxycycline (VIBRA-TABS) 100 MG tablet Take 1 tablet (100 mg total) by mouth 2 (two) times daily. 14 tablet 0  . esomeprazole (NEXIUM) 40 MG capsule Take 40 mg by mouth daily at 12 noon.    . montelukast (SINGULAIR) 10 MG tablet Take 1 tablet (10 mg total) by mouth at bedtime. 30 tablet 3  . Spacer/Aero-Holding Chambers (AEROCHAMBER MV) inhaler Use as instructed 1 each 0  . Tiotropium Bromide Monohydrate (SPIRIVA RESPIMAT) 2.5 MCG/ACT AERS Inhale 2 puffs into the lungs daily. 1 Inhaler 5  . predniSONE (DELTASONE) 10 MG tablet Take 10 mg by mouth daily. Reported on 08/16/2015  0   No current facility-administered medications on file prior to visit.     Review of Systems Constitutional:   No  weight loss, night sweats,  Fevers, chills, + fatigue, or  lassitude.  HEENT:   No headaches,  Difficulty swallowing,  Tooth/dental problems, or  Sore throat,                No sneezing, itching, ear ache, nasal congestion, post nasal drip,   CV:  No chest pain,  Orthopnea, PND, swelling in lower extremities, anasarca, dizziness, palpitations, syncope.   GI  No heartburn, indigestion, abdominal pain, nausea, vomiting, diarrhea, change in bowel habits, loss of appetite, bloody stools.   Resp:    No chest wall deformity  Skin: no  rash or lesions.  GU: no dysuria, change in color of urine, no urgency or frequency.  No flank pain, no hematuria   MS:  No joint pain or swelling.  No decreased range of motion.  No back pain.  Psych:  No change in mood or affect. No depression or anxiety.  No memory loss.         Objective:   Physical Exam Filed Vitals:   08/16/15 1211  BP: 124/70  Pulse: 63  Temp: 97.8 F (36.6 C)  TempSrc: Oral  Height: 5\' 2"  (1.575 m)  Weight: 173 lb (78.472 kg)  SpO2: 94%    GEN: A/Ox3; pleasant , NAD, elderly    HEENT:  [place]/AT,  EACs-clear, TMs-wnl, NOSE-clear, THROAT-clear, no lesions, no postnasal drip or exudate noted.   NECK:  Supple w/ fair ROM; no JVD; normal carotid impulses w/o bruits; no thyromegaly or nodules palpated; no lymphadenopathy.  RESP  Decreased BS in bases no accessory muscle use, no dullness to percussion  CARD:  RRR, no m/r/g  , no peripheral edema, pulses intact, no cyanosis or clubbing.  GI:   Soft & nt; nml bowel sounds; no organomegaly or masses detected.  Musco: Warm bil, no deformities or joint swelling noted.   Neuro: alert, no focal deficits noted.    Skin: Warm, no lesions or rashes  Tammy Parrett NP-C  [facility]  08/16/2015

## 2015-08-16 NOTE — Assessment & Plan Note (Signed)
Recurrent COPD exacerbation. Patient is improving on antibiotics and steroids. Patient is to complete them as directed. Continue on Symbicort and Spiriva. Recent sputum cultures are negative to date  for AFB and Fungal (prelim)   Plan  Finish Doxycycline and Prednisone as directed.  Mucinex DM Twice daily  As needed  Cough/congestion  Follow up with  Dr. Jamison NeighborNestor in 2-3 months and As needed   Please contact office for sooner follow up if symptoms do not improve or worsen or seek emergency care

## 2015-09-07 LAB — AFB CULTURE WITH SMEAR (NOT AT ARMC)

## 2015-09-07 LAB — FUNGUS CULTURE W SMEAR

## 2015-09-30 ENCOUNTER — Other Ambulatory Visit: Payer: Self-pay | Admitting: Pulmonary Disease

## 2015-11-01 ENCOUNTER — Encounter: Payer: Self-pay | Admitting: Pulmonary Disease

## 2015-11-01 ENCOUNTER — Ambulatory Visit (INDEPENDENT_AMBULATORY_CARE_PROVIDER_SITE_OTHER): Payer: Managed Care, Other (non HMO) | Admitting: Pulmonary Disease

## 2015-11-01 VITALS — BP 136/72 | HR 73 | Ht 62.0 in | Wt 173.0 lb

## 2015-11-01 DIAGNOSIS — Z7952 Long term (current) use of systemic steroids: Secondary | ICD-10-CM | POA: Diagnosis not present

## 2015-11-01 DIAGNOSIS — J309 Allergic rhinitis, unspecified: Secondary | ICD-10-CM

## 2015-11-01 DIAGNOSIS — J449 Chronic obstructive pulmonary disease, unspecified: Secondary | ICD-10-CM | POA: Diagnosis not present

## 2015-11-01 MED ORDER — ROFLUMILAST 500 MCG PO TABS: 500.0000 ug | ORAL_TABLET | Freq: Every day | ORAL | 0 refills | Status: DC

## 2015-11-01 NOTE — Patient Instructions (Addendum)
   Call me if you have any new breathing problems before your next appointment.  Take the Daliresp - 1 pill daily. It can cause diarrhea and stomach upset but try to bear with it or take one pill every other day to see if it helps your cough & breathing. If it does then call my office for a prescription. If it doesn't then call and we will switch you over to different inhaled medication through your nebulizer.  I will see you back in 2 months or sooner if needed.   TESTS ORDERED: 1. Spirometry with bronchodilator challenge at next appointment.

## 2015-11-01 NOTE — Progress Notes (Signed)
Subjective:    Patient ID: Erin Morrison, female    DOB: 07-10-1950, 65 y.o.   MRN: [id number]  C.C.:  Follow-up for Moderate COPD, Chronic Steroid Therapy, & Allergic Rhinitis.  HPI Moderate COPD: Patient on Symbicort & Spiriva. Given Respimat Sample to try at last appointment. She is continuing to use her Respimat inhaler. Patient was seen on 08/16/15 for a COPD exacerbation treated with doxycycline and prednisone taper started on 6/22 by me. She is using her rescue inhaler 2-3 times a day plus nebulizer treatments in addition. She reports she is still coughing and producing an intermittently discolored mucus. She reports she does cough at night when she gets up to use the bathroom.   Chronic Steroid Therapy: Prescribed Prednisone 10mg  daily. She started back on Prednisone in September 2016.  Allergic Rhinitis:  On Zyrtec & Singulair. She reports her baseline sinus congestion. No increased sinus pressure or post-nasal drainage.   Review of Systems No fever or sweats. She has had chills. She does have occasional nausea & reflux. No morning brash water taste. She does have occasional chest tightness & pain.   No Known Allergies  Current Outpatient Prescriptions on File Prior to Visit  Medication Sig Dispense Refill  . albuterol (PROVENTIL HFA;VENTOLIN HFA) 108 (90 Base) MCG/ACT inhaler Inhale into the lungs every 6 (six) hours as needed for wheezing or shortness of breath.    Marland Kitchen albuterol (PROVENTIL) (2.5 MG/3ML) 0.083% nebulizer solution Take 2.5 mg by nebulization every 6 (six) hours as needed for wheezing or shortness of breath.    Marland Kitchen aspirin 81 MG tablet Take 81 mg by mouth daily.    . budesonide-formoterol (SYMBICORT) 160-4.5 MCG/ACT inhaler Inhale 2 puffs into the lungs 2 (two) times daily.    . cetirizine (ZYRTEC) 10 MG tablet Take 10 mg by mouth daily.    Marland Kitchen esomeprazole (NEXIUM) 40 MG capsule Take 40 mg by mouth daily at 12 noon.    . montelukast (SINGULAIR) 10 MG tablet TAKE 1  TABLET (10 MG TOTAL) BY MOUTH AT BEDTIME. 30 tablet 3  . predniSONE (DELTASONE) 10 MG tablet Take 10 mg by mouth daily. Reported on 08/16/2015  0  . Spacer/Aero-Holding Chambers (AEROCHAMBER MV) inhaler Use as instructed 1 each 0  . Tiotropium Bromide Monohydrate (SPIRIVA RESPIMAT) 2.5 MCG/ACT AERS Inhale 2 puffs into the lungs daily. 1 Inhaler 5   No current facility-administered medications on file prior to visit.     Past Medical History:  Diagnosis Date  . Acute DVT (deep venous thrombosis) (HCC)   . Allergic rhinitis   . Atrophic vaginitis   . COPD (chronic obstructive pulmonary disease) (HCC)   . Hypertension   . Low back pain   . Umbilical hernia     Past Surgical History:  Procedure Laterality Date  . arthroscopic knee surgery Right   . TUBAL LIGATION      Family History  Problem Relation Age of Onset  . Emphysema Mother   . Cirrhosis Father     Social History   Social History  . Marital status: Married    Spouse name: N/A  . Number of children: N/A  . Years of education: N/A   Occupational History  . Retired    Social History Main Topics  . Smoking status: Former Smoker    Packs/day: 1.50    Years: 46.00    Types: Cigarettes    Start date: 01/31/1964    Quit date: 01/30/2010  . Smokeless tobacco: Never Used  .  Alcohol use No  . Drug use: No  . Sexual activity: Not Asked   Other Topics Concern  . None   Social History Narrative   Adult nurse Pulmonary:   Originally from [place]. Moved to [place] in 1986. Has traveled to [facility]. 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 Has an outside dog. No bird or mold exposure. She does have a musty smell in her bedroom.       Objective:   Physical Exam BP 136/72 (BP Location: Left Arm, Cuff Size: Normal)   Pulse 73   Ht 5\' 2"  (1.575 m)   Wt 173 lb (78.5 kg)   SpO2 94%   BMI 31.64 kg/m  General:  Awake. Alert. No distress. Accompanied by daughter today. Integument:  Warm &  dry. No rash on exposed skin.  Lymphatics:  No appreciated cervical or supraclavicular lymphadenoapthy. HEENT:  Moist mucus membranes. Minimal nasal turbinate swelling. No scleral icterus. Cardiovascular:  Regular rate. No edema. Normal S1 & S2.  Pulmonary:  Again good aeration bilaterally. No accessory muscle use on room air. Clear on auscultation. Abdomen: Soft. Normal bowel sounds. Nontender.  PFT 07/13/15: FVC 2.09 L (73%) FEV1 1.45 L (66%) FEV1/FVC 0.69 FEF 25-75 0.92 L (47%) positive bronchodilator response TLC 5.56 liters (117%) RV 153% ERV 37% DLCO uncorrected 62%  07/21/15: Walked 372 meters / Baseline Sat 94% on RA / Nadir Sat 94% on RA  IMAGING CXR PA/LAT 02/22/15 (per radiologist): Bibasilar scarring or atelectasis. Heart normal in size. No effusion or bony abnormality.  CXR PA/LAT 02/25/11 (per radiologist): Lungs are clear with cardiac silhouette & visualized bony skeleton unremarkable. No evidence of acute cardiopulmonary disease.   CARDIAC EKG 06/08/15 (previously reviewed by me): Normal sinus rhythm. QTC 406 ms. Upright T waves & no ST segment depression to suggest ischemia.  MICROBIOLOGY Sputum Ctx (07/22/15):  Oral Flora / Fungus Negative / AFB Negative   LABS 06/08/15 Tropoinin I: 0.00 IgG: 648 IgA: 248 IgM: 126 IgE: 15 RAST Panel: Negative Alpha-1 antitrypsin: MM (169) CBC: 11.9/16.0/47.4/348  02/26/11 CBC: 8.7/15.8/47.2/238 BMP: 146/3.7/112/23/12/0.58/137/9.3    Assessment & Plan:  65 y.o. female with moderate COPD, chronic steroid therapy, & allergic rhinitis.Patient seems to be relatively asymptomatic with regards to her allergic rhinitis and has no symptoms that would suggest ongoing reflux. With her chronic productive cough she may benefit from the addition of Daliresp to her regimen. Alternatively, we did discuss switching from Symbicort to inhaled Pulmicort with Brovana/Perforomist twice daily. I'm hesitant to switch her current inhaler/nebulizer  regimen. As an alternative treatment option we may be able to start chronic use of azithromycin but given the potential for negative side effects with chronic use I'm holding off at this time. I instructed the patient contact my office if she had any new breathing problems or questions before her next appointment.  1. Moderate COPD: Continuing Symbicort and Spiriva Respimat. Trying the patient on Daliresp daily and given samples for 2 weeks. Repeat spirometry with bronchodilator challenge at next appointment. 2. Chronic Steroid Therapy: Prescribed Prednisone 10 mg by mouth daily. Holding on prednisone taper at this time. 3. Allergic Rhinitis: Continuing Singulair & Zyrtec. No changes. 4. Health Maintenance: S/P Pneumovax January 2013 & Influenza Vaccine September 2017. Previously had Prevnar 13 Vaccine. 5. Follow-up: Patient to return to clinic in 2 months or sooner if needed.  Donna Christen Jamison Neighbor, M.D. [facility] Pager:  [phone] After 3pm or if no response,  call [phone] 11:53 AM 11/01/15

## 2015-11-12 ENCOUNTER — Telehealth: Payer: Self-pay | Admitting: Pulmonary Disease

## 2015-11-12 DIAGNOSIS — J449 Chronic obstructive pulmonary disease, unspecified: Secondary | ICD-10-CM

## 2015-11-12 NOTE — Telephone Encounter (Signed)
Called and spoke with pt and she stated that she is ready to start a new med.  Whatever JN thinks that will help her and not give her these bad side effects.  JN please advise. thanks

## 2015-11-12 NOTE — Telephone Encounter (Signed)
I warned her this could be a side effect of it. Ask her if she's ready to switch to a different inhaler/nebulizer regimen as we discussed. Thanks.

## 2015-11-12 NOTE — Telephone Encounter (Signed)
Called and spoke with pt and she stated that JN started her on Daliresp at her last ov.  She stated that since she started on this she has been having issues with N/V, diarrhea, headache and no appetite.  Pt is requesting further recs.  Please advise. Thanks  No Known Allergies  Last ov--11/01/15

## 2015-11-15 MED ORDER — BUDESONIDE 0.5 MG/2ML IN SUSP: 0.5000 mg | Freq: Two times a day (BID) | RESPIRATORY_TRACT | 3 refills | Status: DC

## 2015-11-15 MED ORDER — FORMOTEROL FUMARATE 20 MCG/2ML IN NEBU: 20.0000 ug | INHALATION_SOLUTION | Freq: Two times a day (BID) | RESPIRATORY_TRACT | 3 refills | Status: DC

## 2015-11-15 NOTE — Telephone Encounter (Signed)
Let stop Symbicort. She should continue using her Spiriva Respimat.  Start Perforomist 1 nebulization bid # 60 with 3 refills Start Pulmicort 0.5 mg nebulized bid #60 with 3 refills

## 2015-11-15 NOTE — Telephone Encounter (Signed)
Patient calling to get prescription for Nebulizer medication.  Per message below, patient did not do well on Daliresp, Dr. Jamison NeighborNestor advised Nebulizer medication.  Dr. Jamison NeighborNestor, please advise what medication you would like sent in for patient?  No Known Allergies

## 2015-11-15 NOTE — Telephone Encounter (Signed)
Rx sent to pharmacy. Patient aware. Nothing further needed.  

## 2015-11-15 NOTE — Telephone Encounter (Signed)
Patient called states that she spoke with a nurse Friday and her nebulizer solution was to be sent to [facility] [place] - nothing sent - pr

## 2015-11-18 ENCOUNTER — Telehealth: Payer: Self-pay | Admitting: Pulmonary Disease

## 2015-11-18 NOTE — Telephone Encounter (Signed)
Pt asking if she can take nebulizer meds back to back.  Pt aware that she can take her Perforomist and Budesonide a few minutes apart but do not mix them and take them together. Nothing further needed.

## 2015-12-02 ENCOUNTER — Telehealth: Payer: Self-pay | Admitting: Pulmonary Disease

## 2015-12-02 NOTE — Telephone Encounter (Signed)
Pt seen 10.3.17 by JN: Patient Instructions   Call me if you have any new breathing problems before your next appointment.  Take the Daliresp - 1 pill daily. It can cause diarrhea and stomach upset but try to bear with it or take one pill every other day to see if it helps your cough & breathing. If it does then call my office for a prescription. If it doesn't then call and we will switch you over to different inhaled medication through your nebulizer.  I will see you back in 2 months or sooner if needed.    TESTS ORDERED: 1. Spirometry with bronchodilator challenge at next appointment.    Per 10.13.17 phone note: Roslynn AmbleJennings E Nestor, MD    3:28 PM Note   Let stop Symbicort. She should continue using her Spiriva Respimat.  Start Perforomist 1 nebulization bid # 60 with 3 refills Start Pulmicort 0.5 mg nebulized bid #60 with 3 refills      Called spoke with patient who c/o wheezing, increased SOB, prod cough with a little bit yellow, some clear but there's "a lot of it".  Pt stated that the insert with the Pulmicort states to contact your phyisician if breathing worsens while taking the medication.  She would like to know if she should just take the Perforomist tonight and hold off on the Pulmicort.  She does mention that she saw her PCP for a cold on 10.23.17 and was treated with abx and prednisone.  She has finished both, still on her maintenence 10mg  prednisone daily.  She does feel like her breathing was doing okay before her recent sickness but is unable to remember if the nebs improved her breathing.  JN please advise, thank you.

## 2015-12-02 NOTE — Telephone Encounter (Signed)
Called and spoke to pt. Informed her of the recs per JN. Appt made with RA on 11.3.17. Pt verbalized understanding and denied any further questions or concerns at this time.

## 2015-12-02 NOTE — Telephone Encounter (Signed)
She should continue both her Perforomist & Pulmicort. We should see her in office ASAP with the first available... Tomorrow. If not tomorrow let me know.

## 2015-12-03 ENCOUNTER — Encounter: Payer: Self-pay | Admitting: Pulmonary Disease

## 2015-12-03 ENCOUNTER — Encounter (HOSPITAL_COMMUNITY): Payer: Self-pay

## 2015-12-03 ENCOUNTER — Inpatient Hospital Stay (HOSPITAL_COMMUNITY): Payer: Managed Care, Other (non HMO)

## 2015-12-03 ENCOUNTER — Inpatient Hospital Stay (HOSPITAL_COMMUNITY)
Admission: AD | Admit: 2015-12-03 | Discharge: 2015-12-10 | DRG: 190 | Disposition: A | Discharge status: Home / Self Care | Payer: Managed Care, Other (non HMO) | Source: Ambulatory Visit | Attending: Pulmonary Disease | Admitting: Pulmonary Disease

## 2015-12-03 ENCOUNTER — Ambulatory Visit (INDEPENDENT_AMBULATORY_CARE_PROVIDER_SITE_OTHER): Payer: Managed Care, Other (non HMO) | Admitting: Pulmonary Disease

## 2015-12-03 DIAGNOSIS — F419 Anxiety disorder, unspecified: Secondary | ICD-10-CM | POA: Diagnosis not present

## 2015-12-03 DIAGNOSIS — M545 Low back pain: Secondary | ICD-10-CM | POA: Diagnosis present

## 2015-12-03 DIAGNOSIS — J181 Lobar pneumonia, unspecified organism: Secondary | ICD-10-CM

## 2015-12-03 DIAGNOSIS — I10 Essential (primary) hypertension: Secondary | ICD-10-CM | POA: Diagnosis present

## 2015-12-03 DIAGNOSIS — Y92230 Patient room in hospital as the place of occurrence of the external cause: Secondary | ICD-10-CM | POA: Diagnosis not present

## 2015-12-03 DIAGNOSIS — R000 Tachycardia, unspecified: Secondary | ICD-10-CM | POA: Diagnosis present

## 2015-12-03 DIAGNOSIS — Z7982 Long term (current) use of aspirin: Secondary | ICD-10-CM | POA: Diagnosis not present

## 2015-12-03 DIAGNOSIS — J9601 Acute respiratory failure with hypoxia: Secondary | ICD-10-CM | POA: Diagnosis present

## 2015-12-03 DIAGNOSIS — R748 Abnormal levels of other serum enzymes: Secondary | ICD-10-CM | POA: Diagnosis not present

## 2015-12-03 DIAGNOSIS — T380X5A Adverse effect of glucocorticoids and synthetic analogues, initial encounter: Secondary | ICD-10-CM | POA: Diagnosis not present

## 2015-12-03 DIAGNOSIS — J9801 Acute bronchospasm: Secondary | ICD-10-CM | POA: Diagnosis present

## 2015-12-03 DIAGNOSIS — J441 Chronic obstructive pulmonary disease with (acute) exacerbation: Secondary | ICD-10-CM

## 2015-12-03 DIAGNOSIS — J189 Pneumonia, unspecified organism: Secondary | ICD-10-CM | POA: Diagnosis present

## 2015-12-03 DIAGNOSIS — J9691 Respiratory failure, unspecified with hypoxia: Secondary | ICD-10-CM | POA: Diagnosis not present

## 2015-12-03 DIAGNOSIS — R0602 Shortness of breath: Secondary | ICD-10-CM

## 2015-12-03 DIAGNOSIS — E785 Hyperlipidemia, unspecified: Secondary | ICD-10-CM

## 2015-12-03 DIAGNOSIS — K429 Umbilical hernia without obstruction or gangrene: Secondary | ICD-10-CM | POA: Diagnosis present

## 2015-12-03 DIAGNOSIS — R7989 Other specified abnormal findings of blood chemistry: Secondary | ICD-10-CM | POA: Diagnosis not present

## 2015-12-03 DIAGNOSIS — Z825 Family history of asthma and other chronic lower respiratory diseases: Secondary | ICD-10-CM | POA: Diagnosis not present

## 2015-12-03 DIAGNOSIS — I2510 Atherosclerotic heart disease of native coronary artery without angina pectoris: Secondary | ICD-10-CM | POA: Diagnosis present

## 2015-12-03 DIAGNOSIS — K219 Gastro-esophageal reflux disease without esophagitis: Secondary | ICD-10-CM | POA: Diagnosis present

## 2015-12-03 DIAGNOSIS — J440 Chronic obstructive pulmonary disease with acute lower respiratory infection: Secondary | ICD-10-CM | POA: Diagnosis present

## 2015-12-03 DIAGNOSIS — Z87891 Personal history of nicotine dependence: Secondary | ICD-10-CM | POA: Diagnosis not present

## 2015-12-03 DIAGNOSIS — I214 Non-ST elevation (NSTEMI) myocardial infarction: Secondary | ICD-10-CM | POA: Diagnosis present

## 2015-12-03 DIAGNOSIS — Z86718 Personal history of other venous thrombosis and embolism: Secondary | ICD-10-CM

## 2015-12-03 DIAGNOSIS — Z7951 Long term (current) use of inhaled steroids: Secondary | ICD-10-CM

## 2015-12-03 DIAGNOSIS — J449 Chronic obstructive pulmonary disease, unspecified: Secondary | ICD-10-CM

## 2015-12-03 DIAGNOSIS — R0600 Dyspnea, unspecified: Secondary | ICD-10-CM | POA: Diagnosis not present

## 2015-12-03 DIAGNOSIS — E784 Other hyperlipidemia: Secondary | ICD-10-CM | POA: Diagnosis not present

## 2015-12-03 HISTORY — DX: Chronic obstructive pulmonary disease with (acute) exacerbation: J44.1

## 2015-12-03 LAB — COMPREHENSIVE METABOLIC PANEL
ALT: 21 U/L (ref 14–54)
ANION GAP: 15 (ref 5–15)
AST: 26 U/L (ref 15–41)
Albumin: 4.7 g/dL (ref 3.5–5.0)
Alkaline Phosphatase: 62 U/L (ref 38–126)
BUN: 11 mg/dL (ref 6–20)
CALCIUM: 9.6 mg/dL (ref 8.9–10.3)
CHLORIDE: 105 mmol/L (ref 101–111)
CO2: 19 mmol/L — AB (ref 22–32)
CREATININE: 0.72 mg/dL (ref 0.44–1.00)
Glucose, Bld: 117 mg/dL — ABNORMAL HIGH (ref 65–99)
Potassium: 4.1 mmol/L (ref 3.5–5.1)
SODIUM: 139 mmol/L (ref 135–145)
Total Bilirubin: 0.7 mg/dL (ref 0.3–1.2)
Total Protein: 7.4 g/dL (ref 6.5–8.1)

## 2015-12-03 LAB — CBC WITH DIFFERENTIAL/PLATELET
BASOS ABS: 0.1 10*3/uL (ref 0.0–0.1)
BASOS PCT: 0 %
EOS ABS: 0.3 10*3/uL (ref 0.0–0.7)
Eosinophils Relative: 2 %
HEMATOCRIT: 49.8 % — AB (ref 36.0–46.0)
Hemoglobin: 16.8 g/dL — ABNORMAL HIGH (ref 12.0–15.0)
Lymphocytes Relative: 15 %
Lymphs Abs: 2.3 10*3/uL (ref 0.7–4.0)
MCH: 31.3 pg (ref 26.0–34.0)
MCHC: 33.7 g/dL (ref 30.0–36.0)
MCV: 92.7 fL (ref 78.0–100.0)
MONO ABS: 1.2 10*3/uL — AB (ref 0.1–1.0)
MONOS PCT: 8 %
NEUTROS ABS: 11.2 10*3/uL — AB (ref 1.7–7.7)
NEUTROS PCT: 75 %
PLATELETS: 353 10*3/uL (ref 150–400)
RBC: 5.37 MIL/uL — ABNORMAL HIGH (ref 3.87–5.11)
RDW: 13.5 % (ref 11.5–15.5)
WBC: 15 10*3/uL — ABNORMAL HIGH (ref 4.0–10.5)

## 2015-12-03 LAB — CREATININE, SERUM
CREATININE: 0.65 mg/dL (ref 0.44–1.00)
GFR calc non Af Amer: >60 mL/min (ref >60)

## 2015-12-03 LAB — BLOOD GAS, ARTERIAL
ACID-BASE DEFICIT: 1.8 mmol/L (ref 0.0–2.0)
BICARBONATE: 25 mmol/L (ref 20.0–28.0)
Drawn by: 441261
O2 CONTENT: 2 L/min
O2 SAT: 93.3 %
PATIENT TEMPERATURE: 98.6
PCO2 ART: 51.5 mmHg — AB (ref 32.0–48.0)
PO2 ART: 79.7 mmHg — AB (ref 83.0–108.0)
pH, Arterial: 7.307 — ABNORMAL LOW (ref 7.350–7.450)

## 2015-12-03 LAB — PROTIME-INR
INR: 1.05
Prothrombin Time: 13.8 seconds (ref 11.4–15.2)

## 2015-12-03 LAB — MAGNESIUM: MAGNESIUM: 2 mg/dL (ref 1.7–2.4)

## 2015-12-03 LAB — TROPONIN I
TROPONIN I: 1.18 ng/mL — AB (ref <0.03)
TROPONIN I: 1.73 ng/mL — AB (ref <0.03)

## 2015-12-03 LAB — PHOSPHORUS: PHOSPHORUS: 4 mg/dL (ref 2.5–4.6)

## 2015-12-03 LAB — APTT: aPTT: 34 seconds (ref 24–36)

## 2015-12-03 MED ORDER — ENOXAPARIN SODIUM 40 MG/0.4ML ~~LOC~~ SOLN
40.0000 mg | SUBCUTANEOUS | Status: DC
  Administered 2015-12-03: 40 mg via SUBCUTANEOUS
  Filled 2015-12-03: qty 0.4

## 2015-12-03 MED ORDER — HEPARIN (PORCINE) IN NACL 100-0.45 UNIT/ML-% IJ SOLN
800.0000 [IU]/h | INTRAMUSCULAR | Status: DC
  Administered 2015-12-03 – 2015-12-05 (×2): 800 [IU]/h via INTRAVENOUS
  Filled 2015-12-03 (×4): qty 250

## 2015-12-03 MED ORDER — AZITHROMYCIN 250 MG PO TABS
500.0000 mg | ORAL_TABLET | Freq: Every day | ORAL | Status: DC
  Administered 2015-12-03 – 2015-12-09 (×7): 500 mg via ORAL
  Filled 2015-12-03 (×7): qty 2

## 2015-12-03 MED ORDER — ALPRAZOLAM 0.25 MG PO TABS
0.2500 mg | ORAL_TABLET | Freq: Once | ORAL | Status: AC
  Administered 2015-12-03: 0.25 mg via ORAL
  Filled 2015-12-03: qty 1

## 2015-12-03 MED ORDER — DEXTROSE 5 % IV SOLN
1.0000 g | INTRAVENOUS | Status: DC
  Administered 2015-12-03 – 2015-12-08 (×6): 1 g via INTRAVENOUS
  Filled 2015-12-03 (×7): qty 10

## 2015-12-03 MED ORDER — ACETAMINOPHEN 325 MG PO TABS
650.0000 mg | ORAL_TABLET | ORAL | Status: DC | PRN
  Administered 2015-12-07 – 2015-12-08 (×2): 650 mg via ORAL
  Filled 2015-12-03 (×2): qty 2

## 2015-12-03 MED ORDER — LEVALBUTEROL HCL 0.63 MG/3ML IN NEBU
0.6300 mg | INHALATION_SOLUTION | RESPIRATORY_TRACT | Status: DC | PRN
Start: 2015-12-03 — End: 2015-12-10
  Administered 2015-12-03 – 2015-12-08 (×5): 0.63 mg via RESPIRATORY_TRACT
  Filled 2015-12-03 (×5): qty 3

## 2015-12-03 MED ORDER — ALBUTEROL SULFATE (2.5 MG/3ML) 0.083% IN NEBU: 2.5000 mg | INHALATION_SOLUTION | RESPIRATORY_TRACT | Status: DC | PRN

## 2015-12-03 MED ORDER — ASPIRIN EC 81 MG PO TBEC: 81.0000 mg | DELAYED_RELEASE_TABLET | Freq: Every day | ORAL | Status: DC

## 2015-12-03 MED ORDER — PANTOPRAZOLE SODIUM 40 MG PO TBEC
40.0000 mg | DELAYED_RELEASE_TABLET | Freq: Every day | ORAL | Status: DC
  Administered 2015-12-03 – 2015-12-10 (×8): 40 mg via ORAL
  Filled 2015-12-03 (×8): qty 1

## 2015-12-03 MED ORDER — ASPIRIN EC 81 MG PO TBEC
81.0000 mg | DELAYED_RELEASE_TABLET | Freq: Every day | ORAL | Status: DC
  Administered 2015-12-04 – 2015-12-10 (×7): 81 mg via ORAL
  Filled 2015-12-03 (×7): qty 1

## 2015-12-03 MED ORDER — LEVALBUTEROL HCL 0.63 MG/3ML IN NEBU
INHALATION_SOLUTION | RESPIRATORY_TRACT | Status: AC
  Filled 2015-12-03: qty 3

## 2015-12-03 MED ORDER — METHYLPREDNISOLONE SODIUM SUCC 125 MG IJ SOLR
60.0000 mg | Freq: Four times a day (QID) | INTRAMUSCULAR | Status: DC
Start: 2015-12-03 — End: 2015-12-08
  Administered 2015-12-03 – 2015-12-08 (×20): 60 mg via INTRAVENOUS
  Filled 2015-12-03 (×20): qty 2

## 2015-12-03 MED ORDER — ASPIRIN EC 325 MG PO TBEC
325.0000 mg | DELAYED_RELEASE_TABLET | Freq: Once | ORAL | Status: AC
  Administered 2015-12-03: 325 mg via ORAL
  Filled 2015-12-03: qty 1

## 2015-12-03 MED ORDER — IPRATROPIUM-ALBUTEROL 0.5-2.5 (3) MG/3ML IN SOLN
3.0000 mL | Freq: Four times a day (QID) | RESPIRATORY_TRACT | Status: DC
  Administered 2015-12-03 – 2015-12-08 (×20): 3 mL via RESPIRATORY_TRACT
  Filled 2015-12-03 (×21): qty 3

## 2015-12-03 NOTE — Progress Notes (Signed)
Placed pt on bipap for increased wob/sob.  Pt is still very anxious but tolerating ok for now.  RN aware.  RT will continue to monitor pt.

## 2015-12-03 NOTE — Progress Notes (Signed)
Pt with in SOB and dyspnea with minimal exertion. 02 increased. Respiratory therapy and RR RN notified.

## 2015-12-03 NOTE — Progress Notes (Signed)
CRITICAL VALUE ALERT  Critical value received:  Troponin 1.18  Date of notification:  12/03/15  Time of notification:  1537  Critical value read back:Yes.    Nurse who received alert:  Carlyle LipaKim Yulitza Shorts  MD notified (1st page):  [facility] [facility] Dr. Arsenio LoaderSommer  Time of first page:  1540  MD notified (2nd page):  Time of second page:  Responding MD:  Dr. Arsenio LoaderSommer  Time MD responded:  [phone]

## 2015-12-03 NOTE — Progress Notes (Signed)
Pt found on nrb and noted for increased wob and sob, but refuses to wear bipap at this time.  RN aware.  RT will continue to monitor and encourage bipap use.

## 2015-12-03 NOTE — Progress Notes (Signed)
eLink Physician-Brief Progress Note Patient Name: Erin Morrison DOB: 11/26/1950 MRN: [id number]   Date of Service  12/03/2015  HPI/Events of Note  Increased SOB, RR and WOB. ABG = 7.30/51/79/25. Patient can't tolerate BiPAP.   eICU Interventions  Will order: 1. Xopenex 0.63 mg via neb now.  2. Portable CXR STAT. 3. EKG STAT - Sinus tachycardia. No acute changes.  4. Dr. Vassie LollAlva informed of events.          Darryl Blumenstein Eugene 12/03/2015, 5:22 PM

## 2015-12-03 NOTE — H&P (Signed)
Name: Erin Morrison MRN: [id number] DOB: 07/15/1950    ADMISSION DATE:  12/03/2015  CHIEF COMPLAINT:  Respiratory distress   HISTORY OF PRESENT ILLNESS:  65 year old with moderate COPD      Chief Complaint  Patient presents with  . Acute Visit    Pt. c/o coughing, SOB difficuilt to walk short distances without having trouble catching her breath, had an asthma attack this am, Used her rescule inhaler with no relief, Has used her neb machine 3 times this am,wheezing, some chest discomfort, also feels dizzy   She was maintained on Symbicort and Spiriva earlier She was changed to Pulmicort with Perforomist She does not like this regimen She developed URI symptoms followed by a chest cold-was given prednisone 60 mg dose pack and Levaquin by her PCP which she completed-she continues to report significant shortness of breath and wheezing-accompanied by her daughter-in law [name] today Is hardly able to walk a few steps-almost called 911 last night Has taken albuterol nebs 3 since morning  Significant tests/ events  PFT 07/13/15: FVC 2.09 L (73%) FEV1 1.45 L (66%) FEV1/FVC 0.69 FEF 25-75 0.92 L (47%) positive bronchodilator response TLC 5.56 liters (117%) RV 153% ERV 37% DLCO uncorrected 62%   PAST MEDICAL HISTORY :   has a past medical history of Acute DVT (deep venous thrombosis) (HCC); Allergic rhinitis; Atrophic vaginitis; COPD (chronic obstructive pulmonary disease) (HCC); Hypertension; Low back pain; and Umbilical hernia.  has a past surgical history that includes arthroscopic knee surgery (Right) and Tubal ligation. Prior to Admission medications   Medication Sig Start Date End Date Taking? Authorizing Provider  albuterol (PROVENTIL HFA;VENTOLIN HFA) 108 (90 Base) MCG/ACT inhaler Inhale into the lungs every 6 (six) hours as needed for wheezing or shortness of breath.    Historical Provider, MD  albuterol (PROVENTIL) (2.5 MG/3ML) 0.083% nebulizer solution Take 2.5 mg by  nebulization every 6 (six) hours as needed for wheezing or shortness of breath.    Historical Provider, MD  aspirin 81 MG tablet Take 81 mg by mouth daily.    Historical Provider, MD  budesonide (PULMICORT) 0.5 MG/2ML nebulizer solution Take 2 mLs (0.5 mg total) by nebulization 2 (two) times daily. DX: J44.9 11/15/15   Roslynn AmbleJennings E Nestor, MD  cetirizine (ZYRTEC) 10 MG tablet Take 10 mg by mouth daily.    Historical Provider, MD  esomeprazole (NEXIUM) 40 MG capsule Take 40 mg by mouth daily at 12 noon.    Historical Provider, MD  formoterol (PERFOROMIST) 20 MCG/2ML nebulizer solution Take 2 mLs (20 mcg total) by nebulization 2 (two) times daily. DX: J44.9 11/15/15   Roslynn AmbleJennings E Nestor, MD  montelukast (SINGULAIR) 10 MG tablet TAKE 1 TABLET (10 MG TOTAL) BY MOUTH AT BEDTIME. 09/30/15   Roslynn AmbleJennings E Nestor, MD  roflumilast (DALIRESP) 500 MCG TABS tablet Take 1 tablet (500 mcg total) by mouth daily. Patient not taking: Reported on 12/03/2015 11/01/15   Roslynn AmbleJennings E Nestor, MD  Spacer/Aero-Holding Chambers (AEROCHAMBER MV) inhaler Use as instructed 06/08/15   Roslynn AmbleJennings E Nestor, MD  Tiotropium Bromide Monohydrate (SPIRIVA RESPIMAT) 2.5 MCG/ACT AERS Inhale 2 puffs into the lungs daily. 07/26/15   Roslynn AmbleJennings E Nestor, MD   No Known Allergies  FAMILY HISTORY:  family history includes Cirrhosis in her father; Emphysema in her mother. SOCIAL HISTORY:  reports that she quit smoking about 5 years ago. Her smoking use included Cigarettes. She started smoking about 51 years ago. She has a 69.00 pack-year smoking history. She has never used smokeless tobacco.  She reports that she does not drink alcohol or use drugs.  REVIEW OF SYSTEMS:   Constitutional: Negative for fever, chills, weight loss, malaise/fatigue and diaphoresis.  HENT: Negative for hearing loss, ear pain, nosebleeds, congestion, sore throat, neck pain, tinnitus and ear discharge.   Eyes: Negative for blurred vision, double vision, photophobia, pain, discharge  and redness.  Respiratory: positive for cough,  sputum production, shortness of breath, wheezing  and no hemoptysis and stridor.   Cardiovascular: Negative for chest pain, palpitations, orthopnea, claudication, leg swelling and PND.  Gastrointestinal: Negative for heartburn, nausea, vomiting, abdominal pain, diarrhea, constipation, blood in stool and melena.  Genitourinary: Negative for dysuria, urgency, frequency, hematuria and flank pain.  Musculoskeletal: Negative for myalgias, back pain, joint pain and falls.  Skin: Negative for itching and rash.  Neurological: Negative for dizziness, tingling, tremors, sensory change, speech change, focal weakness, seizures, loss of consciousness, weakness and headaches.  Endo/Heme/Allergies: Negative for environmental allergies and polydipsia. Does not bruise/bleed easily.  SUBJECTIVE:   VITAL SIGNS: Temp:  [99.2 F (37.3 C)] 99.2 F (37.3 C) (11/03 1237) Pulse Rate:  [78-86] 78 (11/03 1237) Resp:  [19] 19 (11/03 1237) BP: (104-134)/(76-88) 134/88 (11/03 1237) SpO2:  [90 %-92 %] 90 % (11/03 1237) Weight:  [170 lb (77.1 kg)-170 lb 3.2 oz (77.2 kg)] 170 lb (77.1 kg) (11/03 1237)  PHYSICAL EXAMINATION:  Gen. Pleasant, well-nourished, in no distress ENT - no lesions, no post nasal drip Neck: No JVD, no thyromegaly, no carotid bruits Lungs: no use of accessory muscles, no dullness to percussion, Bilateral diffuse rhonchi  Cardiovascular: Rhythm regular, heart sounds  normal, no murmurs or gallops, no peripheral edema Musculoskeletal: No deformities, no cyanosis or clubbing  No results for input(s): NA, K, CL, CO2, BUN, CREATININE, GLUCOSE in the last 168 hours. No results for input(s): HGB, HCT, WBC, PLT in the last 168 hours. No results found.  ASSESSMENT / PLAN:  Acute exacerbation of COPD- She has failed outpatient therapy with short prednisone burst and Levaquin She is significantly short of breath today and is already taken 3 albuterol  nebs prior to being seen in the office and has significant bronchospasm- best to admit her over the weekend for IV therapy   Recommend-  IV Solu-Medrol 60 every 6 and do nebs every 6 hours Can use albuterol nebs every 2 hours as needed for wheezing  We'll empirically treat with antibiotics for community-acquired pneumonia pending CBC and chest x-ray-can narrow down as needed  Her other home medications will be continued  Cyril Mourningakesh Montana Fassnacht MD. FCCP. [facility] Pager [phone] If no response call [phone]   12/03/2015    12/03/2015, 12:59 PM

## 2015-12-03 NOTE — Progress Notes (Signed)
eLink Physician-Brief Progress Note Patient Name: Erin Morrison DOB: 10/08/1950 MRN: [id number]   Date of Service  12/03/2015  HPI/Events of Note  Spoke with Cardiology in consultation this evening. Patient likely has underlying coronary artery disease given risk factors. He recommended full dose aspirin and heparin drip per pharmacy protocol for acute coronary syndrome. Could consider pulmonary embolism by clinical history is not suggestive. Patient also still having dyspnea and would be unable to lay flat for CT angiogram. Camera check sugars patient going on BiPAP mask. Patient questioned regarding any signs of bleeding which she denies. Informed her that we would be starting systemic anticoagulation with increased risk of bleeding. Respiratory therapist at bedside.   eICU Interventions  1. Starting heparin drip per pharmacy protocol 2. Aspirin 325 mg by mouth 1 3. Continuing aspirin 81 mg by mouth daily 4. Repeating EKG in the morning 5. Adding TSH, free T4, & hemoglobin A1c the morning labs      Intervention Category Major Interventions: Other:  Lawanda CousinsJennings Mica Releford 12/03/2015, 10:04 PM

## 2015-12-03 NOTE — Progress Notes (Signed)
ANTICOAGULATION CONSULT NOTE - Initial Consult  Pharmacy Consult for Heparin Indication: chest pain/ACS  No Known Allergies  Patient Measurements: Height: 5\' 2"  (157.5 cm) Weight: 170 lb (77.1 kg) IBW/kg (Calculated) : 50.1 Heparin Dosing Weight: 67kg  Vital Signs: Temp: 96.6 F (35.9 C) (11/03 2000) Temp Source: Oral (11/03 2000) BP: 142/98 (11/03 2000) Pulse Rate: 93 (11/03 2000)  Labs:  Recent Labs  12/03/15 1435 12/03/15 2009  HGB 16.8*  --   HCT 49.8*  --   PLT 353  --   CREATININE 0.72  0.65  --   TROPONINI 1.18* 1.73*   Estimated Creatinine Clearance: 65.6 mL/min (by C-G formula based on SCr of 0.65 mg/dL).  Medical History: Past Medical History:  Diagnosis Date  . Acute DVT (deep venous thrombosis) (HCC)   . Allergic rhinitis   . Atrophic vaginitis   . COPD (chronic obstructive pulmonary disease) (HCC)   . Hypertension   . Low back pain   . Umbilical hernia    Medications:  Scheduled:  . [START ON 12/04/2015] aspirin EC  81 mg Oral Daily  . azithromycin  500 mg Oral Daily  . cefTRIAXone (ROCEPHIN)  IV  1 g Intravenous Q24H  . ipratropium-albuterol  3 mL Nebulization Q6H  . levalbuterol      . methylPREDNISolone (SOLU-MEDROL) injection  60 mg Intravenous Q6H  . pantoprazole  40 mg Oral Daily   Infusions:  . heparin     Assessment: [age] [facility]F [facility] course of Leva[facility] & Prednisone, hx COPD. Baseline Troponin elevated, 2nd level increased, probable underlying CAD, begin ASA and Heparin infusion for ACS.  -Lovenox 40mg  given x1 this evening just prior to Heparin protocol order - aPTT/INR ordered, no anti-coag PTA  Today, 12/03/2015   Goal of Therapy:  Heparin level 0.3-0.7 units/ml Monitor platelets by anticoagulation protocol: Yes   Plan:   No Heparin bolus, begin infusion at 800 units/hr  Check 6 hr Heparin level  Daily CBC ordered  Otho BellowsGreen, Demorris Choyce L PharmD Pager [phone] 12/03/2015, 10:43 PM

## 2015-12-03 NOTE — Progress Notes (Signed)
   Subjective:    Patient ID: Erin Morrison, female    DOB: 01/19/1951, 65 y.o.   MRN: [id number]  HPI 65 year old with moderate COPD  Chief Complaint  Patient presents with  . Acute Visit    Pt. c/o coughing, SOB difficuilt to walk short distances without having trouble catching her breath, had an asthma attack this am, Used her rescule inhaler with no relief, Has used her neb machine 3 times this am,wheezing, some chest discomfort, also feels dizzy   She was maintained on Symbicort and Spiriva earlier She was changed to Pulmicort with Perforomist She does not like this regimen She developed URI symptoms followed by a chest cold-was given prednisone 60 mg dose pack and Levaquin by her PCP which she completed-she continues to report significant shortness of breath and wheezing-accompanied by her daughter-in law [name] today Is hardly able to walk a few steps-almost called 911 last night Has taken albuterol nebs 3 since morning  Significant tests/ events  PFT 07/13/15: FVC 2.09 L (73%) FEV1 1.45 L (66%) FEV1/FVC 0.69 FEF 25-75 0.92 L (47%) positive bronchodilator response TLC 5.56 liters (117%) RV 153% ERV 37% DLCO uncorrected 62% Review of Systems Patient denies significant dyspnea,cough, hemoptysis,  chest pain, palpitations, pedal edema, orthopnea, paroxysmal nocturnal dyspnea, lightheadedness, nausea, vomiting, abdominal or  leg pains      Objective:   Physical Exam  Gen. Pleasant, well-nourished, in no distress ENT - no lesions, no post nasal drip Neck: No JVD, no thyromegaly, no carotid bruits Lungs: no use of accessory muscles, no dullness to percussion, Bilateral diffuse rhonchi  Cardiovascular: Rhythm regular, heart sounds  normal, no murmurs or gallops, no peripheral edema Musculoskeletal: No deformities, no cyanosis or clubbing        Assessment & Plan:

## 2015-12-03 NOTE — Significant Event (Signed)
Discussed case with Roslynn AmbleJennings E Nestor, MD  65 year old with acute respiratory failure due to COPD exacerbation. History of DVT. No known CAD history.  On non-re breather due to anxiety and shortness of breath unable to tolerate BiPap.  Presented to pulmonary clinic with wheezing and shortness of breath after URI.  Troponin initially 1.18, increased to 1.73.  Mild chest pain with her shortness of breath earlier, none current.  ECGs reviewed, non-specific changes and sinus tachycardia.  No overt ischemic changes at this time.    Troponin elevation likely reflects type II event from supply demand mismatch  Preliminary recommendations: - 325 ASA x 1, continue with 81 mg ASA daily thereafter - Consider pulmonary embolism work-up per primary team - In the absence of overt bleeding risk, reasonable to acutely treat with IV heparin pending troponin trend, PE evaluation, and clinical course - repeat ECG in the morning and with worsening symptoms - Keep on telemetry - Check A1C, TSH, fasting lipid panel in AM  Cardiology to continue to follow along and will see her formally as soon as able

## 2015-12-03 NOTE — Progress Notes (Addendum)
eLink Physician-Brief Progress Note Patient Name: Erin ManSandra Morrison DOB: 03/01/1950 MRN: [id number]   Date of Service  12/03/2015  HPI/Events of Note  Came to check on patient admitted with acute respiratory failure. Husband at bedside. Patient currently on nonrebreather. Having difficulty tolerating BiPAP. Reports the previous chest discomfort she was feeling has resolved. Dyspnea is also improving. Does report some mild lower chest/flank pain that seems to be improving as well. Does have some mild nausea but denies diarrhea. Troponin I noted to be increasing and now 1.73 from 1.18. Renal function normal. Patient currently normotensive. Previously had tachycardia but no evidence of hypotension. No known history of coronary artery disease but does have a significant history of tobacco use.   eICU Interventions  1. Encouraged patient to use BiPAP as tolerated 2. Continuing current plan of care 3. Continuing telemetry monitoring 4. Consult cardiology for further recommendations 5. Ordering complete echocardiogram  6. Continuing supplemental oxygen and aspirin 81 mg by mouth daily 7. Ordering lipid panel for the morning      Intervention Category Major Interventions: Respiratory failure - evaluation and management;Other:  [name] 12/03/2015, 9:38 PM

## 2015-12-03 NOTE — Progress Notes (Signed)
eLink Physician-Brief Progress Note Patient Name: Erin Morrison DOB: 11/13/1950 MRN: [id number]   Date of Service  12/03/2015  HPI/Events of Note  Multiple issues: 1. Anxiety and 2. WOB remains increased and c/o SOB - portable CXR reveals no pulmonary edema. So this is likely all related to AECOPD. She is due for another Xopenex Q 3 hours PRN Rx about now.   eICU Interventions  Will order: 1. Xanax 0.25 mg PO now.  2. Give PRN Xopenex Neb Rx now.      Intervention Category Intermediate Interventions: Respiratory distress - evaluation and management  Amadi Yoshino Eugene 12/03/2015, 8:11 PM

## 2015-12-03 NOTE — Progress Notes (Signed)
eLink Physician-Brief Progress Note Patient Name: Erin Morrison DOB: 09/18/1950 MRN: [id number]   Date of Service  12/03/2015  HPI/Events of Note  Troponin #1 = 1.28. EKG - NSR with rate = 95. No acute changes. Fusion beats. Demand ischemia?  eICU Interventions  Will order: 1. ASA 81 mg PO now and Q day.  2. Continue to trend Troponin.     Intervention Category Intermediate Interventions: Diagnostic test evaluation  Sommer,Steven Dennard Nipugene 12/03/2015, 3:58 PM

## 2015-12-03 NOTE — Assessment & Plan Note (Signed)
Admit to hospital for management of acute exacerbation of COPD Treat with IV steroids and bronchodilator

## 2015-12-04 ENCOUNTER — Inpatient Hospital Stay (HOSPITAL_COMMUNITY): Payer: Managed Care, Other (non HMO)

## 2015-12-04 DIAGNOSIS — J189 Pneumonia, unspecified organism: Secondary | ICD-10-CM

## 2015-12-04 DIAGNOSIS — R7989 Other specified abnormal findings of blood chemistry: Secondary | ICD-10-CM

## 2015-12-04 DIAGNOSIS — J181 Lobar pneumonia, unspecified organism: Secondary | ICD-10-CM

## 2015-12-04 DIAGNOSIS — R0600 Dyspnea, unspecified: Secondary | ICD-10-CM

## 2015-12-04 DIAGNOSIS — J441 Chronic obstructive pulmonary disease with (acute) exacerbation: Principal | ICD-10-CM

## 2015-12-04 DIAGNOSIS — R748 Abnormal levels of other serum enzymes: Secondary | ICD-10-CM

## 2015-12-04 DIAGNOSIS — J9601 Acute respiratory failure with hypoxia: Secondary | ICD-10-CM

## 2015-12-04 DIAGNOSIS — J9691 Respiratory failure, unspecified with hypoxia: Secondary | ICD-10-CM

## 2015-12-04 LAB — ECHOCARDIOGRAM COMPLETE
CHL CUP MV DEC (S): 324
E/e' ratio: 10.23
EWDT: 324 ms
FS: 39 % (ref 28–44)
Height: 62 in
IV/PV OW: 1.11
LA vol: 33.4 mL
LADIAMINDEX: 1.69 cm/m2
LASIZE: 30 mm
LAVOLA4C: 22 mL
LAVOLIN: 18.8 mL/m2
LEFT ATRIUM END SYS DIAM: 30 mm
LV E/e' medial: 10.23
LV E/e'average: 10.23
LV PW d: 11.4 mm — AB (ref 0.6–1.1)
LV TDI E'MEDIAL: 3.7
LV e' LATERAL: 5.22 cm/s
LVOT SV: 58 mL
LVOT VTI: 22.9 cm
LVOT area: 2.54 cm2
LVOT peak grad rest: 6 mmHg
LVOT peak vel: 126 cm/s
LVOTD: 18 mm
MV pk E vel: 53.4 m/s
MVPKAVEL: 78.6 m/s
TAPSE: 16.4 mm
TDI e' lateral: 5.22
Weight: 2691.38 oz

## 2015-12-04 LAB — CBC WITH DIFFERENTIAL/PLATELET
BASOS ABS: 0 10*3/uL (ref 0.0–0.1)
Basophils Relative: 0 %
EOS ABS: 0 10*3/uL (ref 0.0–0.7)
EOS PCT: 0 %
HCT: 48.5 % — ABNORMAL HIGH (ref 36.0–46.0)
Hemoglobin: 15.9 g/dL — ABNORMAL HIGH (ref 12.0–15.0)
LYMPHS PCT: 10 %
Lymphs Abs: 1.1 10*3/uL (ref 0.7–4.0)
MCH: 30.7 pg (ref 26.0–34.0)
MCHC: 32.8 g/dL (ref 30.0–36.0)
MCV: 93.6 fL (ref 78.0–100.0)
Monocytes Absolute: 0.4 10*3/uL (ref 0.1–1.0)
Monocytes Relative: 3 %
Neutro Abs: 10.1 10*3/uL — ABNORMAL HIGH (ref 1.7–7.7)
Neutrophils Relative %: 87 %
PLATELETS: 319 10*3/uL (ref 150–400)
RBC: 5.18 MIL/uL — AB (ref 3.87–5.11)
RDW: 13.4 % (ref 11.5–15.5)
WBC: 11.7 10*3/uL — AB (ref 4.0–10.5)

## 2015-12-04 LAB — LIPID PANEL
CHOLESTEROL: 207 mg/dL — AB (ref 0–200)
HDL: 57 mg/dL (ref >40)
LDL CALC: 134 mg/dL — AB (ref 0–99)
TRIGLYCERIDES: 79 mg/dL (ref <150)
Total CHOL/HDL Ratio: 3.6 RATIO
VLDL: 16 mg/dL (ref 0–40)

## 2015-12-04 LAB — RENAL FUNCTION PANEL
Albumin: 4.1 g/dL (ref 3.5–5.0)
Anion gap: 10 (ref 5–15)
BUN: 14 mg/dL (ref 6–20)
CHLORIDE: 101 mmol/L (ref 101–111)
CO2: 25 mmol/L (ref 22–32)
CREATININE: 0.63 mg/dL (ref 0.44–1.00)
Calcium: 9.6 mg/dL (ref 8.9–10.3)
Glucose, Bld: 149 mg/dL — ABNORMAL HIGH (ref 65–99)
Phosphorus: 4.3 mg/dL (ref 2.5–4.6)
Potassium: 4.4 mmol/L (ref 3.5–5.1)
Sodium: 136 mmol/L (ref 135–145)

## 2015-12-04 LAB — MAGNESIUM: MAGNESIUM: 2.1 mg/dL (ref 1.7–2.4)

## 2015-12-04 LAB — HEPARIN LEVEL (UNFRACTIONATED)
HEPARIN UNFRACTIONATED: 0.68 [IU]/mL (ref 0.30–0.70)
HEPARIN UNFRACTIONATED: 0.58 [IU]/mL (ref 0.30–0.70)

## 2015-12-04 LAB — TROPONIN I
TROPONIN I: 1.21 ng/mL — AB (ref <0.03)
Troponin I: 0.92 ng/mL (ref <0.03)

## 2015-12-04 LAB — T4, FREE: FREE T4: 1.09 ng/dL (ref 0.61–1.12)

## 2015-12-04 LAB — TSH: TSH: 0.75 u[IU]/mL (ref 0.350–4.500)

## 2015-12-04 MED ORDER — ALPRAZOLAM 0.5 MG PO TABS
0.5000 mg | ORAL_TABLET | Freq: Once | ORAL | Status: AC
  Administered 2015-12-04: 0.5 mg via ORAL
  Filled 2015-12-04: qty 1

## 2015-12-04 NOTE — Progress Notes (Signed)
Xopenex 0.63 PRN tx given at approx 1545- [facility]. BS coarse - diminished, HR 95, RR 17, Sp02 on 8 lpm hfnc 94%.

## 2015-12-04 NOTE — Progress Notes (Signed)
    Subjective:  Denies CP or dyspnea   Objective:  Vitals:   12/04/15 0600 12/04/15 0605 12/04/15 0729 12/04/15 0800  BP:      Pulse:   72   Resp:   17   Temp:    98.8 F (37.1 C)  TempSrc:    Oral  SpO2: 93% 95% 96%   Weight:  168 lb 3.4 oz (76.3 kg)    Height:        Intake/Output from previous day:  Intake/Output Summary (Last 24 hours) at 12/04/15 0825 Last data filed at 12/04/15 0700  Gross per 24 hour  Intake              252 ml  Output              250 ml  Net                2 ml    Physical Exam: Physical exam: Well-developed chronically ill appearing in no acute distress.  Skin is warm and dry.  HEENT is normal.  Neck is supple.  Chest is clear to auscultation with normal expansion.  Cardiovascular exam is regular rate and rhythm.  Abdominal exam nontender or distended. No masses palpated. Extremities show no edema. neuro grossly intact    Lab Results: Basic Metabolic Panel:  Recent Labs  40/98/1109/04/15 1435 12/04/15 0511  NA 139 136  K 4.1 4.4  CL 105 101  CO2 19* 25  GLUCOSE 117* 149*  BUN 11 14  CREATININE 0.72  0.65 0.63  CALCIUM 9.6 9.6  MG 2.0 2.1  PHOS 4.0 4.3   CBC:  Recent Labs  12/03/15 1435 12/04/15 0511  WBC 15.0* 11.7*  NEUTROABS 11.2* 10.1*  HGB 16.8* 15.9*  HCT 49.8* 48.5*  MCV 92.7 93.6  PLT 353 319   Cardiac Enzymes:  Recent Labs  12/03/15 1435 12/03/15 2009 12/04/15 0511  TROPONINI 1.18* 1.73* 1.21*     Assessment/Plan:  65 year old female admitted with COPD/URI symptoms and chest pain. Troponin noted to be abnormal and cardiology asked to evaluate.  1 elevated troponin-I have reviewed the patient's electrocardiograms and there are no acute ST changes. She is not actively having chest pain. There is no clear trend with her enzymes. Await results of echocardiogram. Will need ischemia evaluation after her respiratory status improves. Continue aspirin and heparin. No beta blockade for now given COPD.  2  upper respiratory infection/COPD-antibiotics, bronchodilators and steroids.  3 history of hypertension-follow blood pressure and adjust medicines as needed.  4 history of DVT   Olga MillersBrian Tamatha Gadbois 12/04/2015, 8:25 AM

## 2015-12-04 NOTE — Consult Note (Signed)
Chief Complaint/Reason for Consult: elevated troponin and chest pain   Requesting Physician: Tera Partridge  PCP:  Cyndi Bender, PA-C Primary Cardiologist:n/a  HPI:  65 year old female with COPD and previous DVT, HTN presenting to the hospital from pulmonary clinic for shortness of breath.  She notes several days worth of cough and URI symptoms with sputum production.  Despite steroids and inhalers symptoms worsened.  She experienced some chest pain when her breathing/wheezing were at the worst.  Given symptoms was admitted to [facility] and transferred to their step down / icu given respiratory failure with intermittent bipap and non re-breather.    On interview, she is sleeping with bipap on but easily arouses.  She denies active SOB or chest pain, just notes fatigue.  She relays history as above and notes that she has had some chest pain in random situations for weeks.  Not classically associated with exertion.  She has not really mentioned this to anyone. Previous smoker.  No early CAD family history.  Denies other cath or stress testing.  Troponin initially 1.18, increased to 1.73. ECGs reviewed, non-specific changes and sinus tachycardia.  No overt ischemic changes at this time.  Dr. [name] called me to evaluate patient at [facility] given troponin elevation.    Past Medical History:  Diagnosis Date  . Acute DVT (deep venous thrombosis) ([place])   . Allergic rhinitis   . Atrophic vaginitis   . COPD (chronic obstructive pulmonary disease) ([place])   . Hypertension   . Low back pain   . Umbilical hernia     Past Surgical History:  Procedure Laterality Date  . arthroscopic knee surgery Right   . TUBAL LIGATION      Family History  Problem Relation Age of Onset  . Emphysema Mother   . Cirrhosis Father    Social History:  reports that she quit smoking about 5 years ago. Her smoking use included Cigarettes. She started smoking about 51 years ago. She has a 69.00 pack-year  smoking history. She has never used smokeless tobacco. She reports that she does not drink alcohol or use drugs.  Allergies: No Known Allergies  No current facility-administered medications on file prior to encounter.    Current Outpatient Prescriptions on File Prior to Encounter  Medication Sig Dispense Refill  . albuterol (PROVENTIL HFA;VENTOLIN HFA) 108 (90 Base) MCG/ACT inhaler Inhale into the lungs every 6 (six) hours as needed for wheezing or shortness of breath.    Marland Kitchen albuterol (PROVENTIL) (2.5 MG/3ML) 0.083% nebulizer solution Take 2.5 mg by nebulization every 6 (six) hours as needed for wheezing or shortness of breath.    Marland Kitchen aspirin 81 MG tablet Take 81 mg by mouth daily.    . budesonide (PULMICORT) 0.5 MG/2ML nebulizer solution Take 2 mLs (0.5 mg total) by nebulization 2 (two) times daily. DX: J44.9 120 mL 3  . cetirizine (ZYRTEC) 10 MG tablet Take 10 mg by mouth daily.    Marland Kitchen esomeprazole (NEXIUM) 40 MG capsule Take 40 mg by mouth daily as needed (for acid reflex).     . formoterol (PERFOROMIST) 20 MCG/2ML nebulizer solution Take 2 mLs (20 mcg total) by nebulization 2 (two) times daily. DX: J44.9 120 mL 3  . montelukast (SINGULAIR) 10 MG tablet TAKE 1 TABLET (10 MG TOTAL) BY MOUTH AT BEDTIME. 30 tablet 3  . Spacer/Aero-Holding Chambers (AEROCHAMBER MV) inhaler Use as instructed 1 each 0  . Tiotropium Bromide Monohydrate (SPIRIVA RESPIMAT) 2.5 MCG/ACT AERS Inhale 2 puffs  into the lungs daily. 1 Inhaler 5  . roflumilast (DALIRESP) 500 MCG TABS tablet Take 1 tablet (500 mcg total) by mouth daily. (Patient not taking: Reported on 12/03/2015) 14 tablet 0     Results for orders placed or performed during the hospital encounter of 12/03/15 (from the past 48 hour(s))  Creatinine, serum     Status: None   Collection Time: 12/03/15  2:35 PM  Result Value Ref Range   Creatinine, Ser 0.65 0.44 - 1.00 mg/dL   GFR calc non Af Amer >60 >60 mL/min   GFR calc Af Amer >60 >60 mL/min    Comment:  (NOTE) The eGFR has been calculated using the CKD EPI equation. This calculation has not been validated in all clinical situations. eGFR's persistently <60 mL/min signify possible Chronic Kidney Disease.   Comprehensive metabolic panel     Status: Abnormal   Collection Time: 12/03/15  2:35 PM  Result Value Ref Range   Sodium 139 135 - 145 mmol/L   Potassium 4.1 3.5 - 5.1 mmol/L   Chloride 105 101 - 111 mmol/L   CO2 19 (L) 22 - 32 mmol/L   Glucose, Bld 117 (H) 65 - 99 mg/dL   BUN 11 6 - 20 mg/dL   Creatinine, Ser 0.72 0.44 - 1.00 mg/dL   Calcium 9.6 8.9 - 10.3 mg/dL   Total Protein 7.4 6.5 - 8.1 g/dL   Albumin 4.7 3.5 - 5.0 g/dL   AST 26 15 - 41 U/L   ALT 21 14 - 54 U/L   Alkaline Phosphatase 62 38 - 126 U/L   Total Bilirubin 0.7 0.3 - 1.2 mg/dL   GFR calc non Af Amer >60 >60 mL/min   GFR calc Af Amer >60 >60 mL/min    Comment: (NOTE) The eGFR has been calculated using the CKD EPI equation. This calculation has not been validated in all clinical situations. eGFR's persistently <60 mL/min signify possible Chronic Kidney Disease.    Anion gap 15 5 - 15  Magnesium     Status: None   Collection Time: 12/03/15  2:35 PM  Result Value Ref Range   Magnesium 2.0 1.7 - 2.4 mg/dL  Phosphorus     Status: None   Collection Time: 12/03/15  2:35 PM  Result Value Ref Range   Phosphorus 4.0 2.5 - 4.6 mg/dL  Troponin I     Status: Abnormal   Collection Time: 12/03/15  2:35 PM  Result Value Ref Range   Troponin I 1.18 (HH) <0.03 ng/mL    Comment: CRITICAL RESULT CALLED TO, READ BACK BY AND VERIFIED WITH: OSBORNE,K @ 1537 ON [id number] BY POTEAT,S   CBC WITH DIFFERENTIAL     Status: Abnormal   Collection Time: 12/03/15  2:35 PM  Result Value Ref Range   WBC 15.0 (H) 4.0 - 10.5 K/uL   RBC 5.37 (H) 3.87 - 5.11 MIL/uL   Hemoglobin 16.8 (H) 12.0 - 15.0 g/dL   HCT 49.8 (H) 36.0 - 46.0 %   MCV 92.7 78.0 - 100.0 fL   MCH 31.3 26.0 - 34.0 pg   MCHC 33.7 30.0 - 36.0 g/dL   RDW 13.5 11.5 - 15.5  %   Platelets 353 150 - 400 K/uL   Neutrophils Relative % 75 %   Neutro Abs 11.2 (H) 1.7 - 7.7 K/uL   Lymphocytes Relative 15 %   Lymphs Abs 2.3 0.7 - 4.0 K/uL   Monocytes Relative 8 %   Monocytes Absolute 1.2 (H) 0.1 -  1.0 K/uL   Eosinophils Relative 2 %   Eosinophils Absolute 0.3 0.0 - 0.7 K/uL   Basophils Relative 0 %   Basophils Absolute 0.1 0.0 - 0.1 K/uL  Blood gas, arterial     Status: Abnormal   Collection Time: 12/03/15  4:24 PM  Result Value Ref Range   O2 Content 2.0 L/min   Delivery systems NASAL CANNULA    pH, Arterial 7.307 (L) 7.350 - 7.450   pCO2 arterial 51.5 (H) 32.0 - 48.0 mmHg   pO2, Arterial 79.7 (L) 83.0 - 108.0 mmHg   Bicarbonate 25.0 20.0 - 28.0 mmol/L   Acid-base deficit 1.8 0.0 - 2.0 mmol/L   O2 Saturation 93.3 %   Patient temperature 98.6    Collection site LEFT RADIAL    Drawn by [id number]    Sample type ARTERIAL DRAW    Allens test (pass/fail) PASS PASS  Troponin I (q 6hr x 3)     Status: Abnormal   Collection Time: 12/03/15  8:09 PM  Result Value Ref Range   Troponin I 1.73 (HH) <0.03 ng/mL    Comment: CRITICAL VALUE NOTED.  VALUE IS CONSISTENT WITH PREVIOUSLY REPORTED AND CALLED VALUE.  Protime-INR     Status: None   Collection Time: 12/03/15 11:08 PM  Result Value Ref Range   Prothrombin Time 13.8 11.4 - 15.2 seconds   INR 1.05   APTT     Status: None   Collection Time: 12/03/15 11:08 PM  Result Value Ref Range   aPTT 34 24 - 36 seconds   Dg Chest 2 View  Result Date: 12/03/2015 CLINICAL DATA:  COPD exacerbation. Worsening shortness of breath for 2 weeks with left-sided chest pain and cough. EXAM: CHEST  2 VIEW COMPARISON:  02/25/2011 FINDINGS: The cardiac silhouette is borderline enlarged, accentuated by AP technique. Aortic atherosclerosis is noted. There is new mild elevation of the right hemidiaphragm increased AP diameter of the chest is again seen consistent with hyperinflation and underlying COPD. Airway thickening and interstitial  coarsening have increased from the prior study. There is minimal scarring or atelectasis in the left lung base. No lobar consolidation, edema, pleural effusion, or pneumothorax is identified. No acute osseous abnormality is seen. IMPRESSION: Increased bronchitic changes. Electronically Signed   By: Logan Bores M.D.   On: 12/03/2015 15:15   Dg Chest Port 1 View  Result Date: 12/03/2015 CLINICAL DATA:  Sudden onset of increased shortness of breath and dyspnea, history hypertension, COPD, former smoker EXAM: PORTABLE CHEST 1 VIEW COMPARISON:  Portable exam 1704 hours compared to 12/03/2015 at 1455 hours FINDINGS: Normal heart size, mediastinal contours, and pulmonary vascularity. Bronchitic changes with slight increase in RIGHT perihilar markings since the earlier exam question developing pneumonia. Linear subsegmental atelectasis LEFT base. Remaining lungs clear. No pleural effusion or pneumothorax. IMPRESSION: Bronchitic changes with questionable RIGHT basilar infiltrate. Electronically Signed   By: Lavonia Dana M.D.   On: 12/03/2015 18:04    ECG/TELE: ECGs reviewed, non-specific changes and sinus tachycardia.  No overt ischemic changes at this time.    ROS: As above. Otherwise, review of systems is negative unless per above HPI  Vitals:   12/03/15 2100 12/03/15 2200 12/03/15 2201 12/03/15 2342  BP: 112/60 136/83 112/60 136/83  Pulse: 92 90 88 84  Resp: (!) 21 20 (!) 23 15  Temp:      TempSrc:      SpO2: 94% 96% 95% 97%  Weight:      Height:  Wt Readings from Last 10 Encounters:  12/03/15 77.1 kg (170 lb)  12/03/15 77.2 kg (170 lb 3.2 oz)  11/01/15 78.5 kg (173 lb)  08/16/15 78.5 kg (173 lb)  07/21/15 78.4 kg (172 lb 12.8 oz)  06/08/15 76.7 kg (169 lb 3.2 oz)    PE:  General: No acute distress; sleeping but arouses with bipap on HEENT: Atraumatic, EOMI, mucous membranes not visualized due to bipap CV: distant RRR no murmurs, gallops. No JVD at 60 degrees. No HJR. Respiratory:  on bipap and appears comfortable.  Globally wheezing with prolonged exp phase but moving decent air.  No crackles ABD: Non-distended and non-tender. No palpable organomegaly.  Extremities: 2+ radial pulses bilaterally. Trace bilateral edema. Neuro/Psych: CN grossly intact, alert and oriented  Assessment/Plan COPD exacerbation with hypercarbic hypoxic respiratory failure Troponin elevation  Troponin elevation likely reflects type II event from supply demand mismatch as predominate presentation likely due to URI and subsequent COPD exacerbation.  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  Also on differential is DVT/PE given history of DVT.   Recommendations: - 325 ASA x 1, continue with 81 mg ASA daily thereafter - Consider pulmonary embolism work-up per primary team - In the absence of overt bleeding risk, reasonable to acutely treat with IV heparin pending troponin trend, PE evaluation, and clinical course.  This should be re-evaluated over the next 24-48 hrs to determine length of treatment.  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 - Please call with clinical worsening or concerns and patient can be transferred to [facility] on hospitalist service for continued COPD management with cardiology comanagement of elevated troponin.     Lolita Cram Means  MD 12/04/2015, 12:03 AM

## 2015-12-04 NOTE — Progress Notes (Signed)
ANTICOAGULATION CONSULT NOTE -  Pharmacy Consult for Heparin Indication: chest pain/ACS  No Known Allergies  Patient Measurements: Height: 5\' 2"  (157.5 cm) Weight: 168 lb 3.4 oz (76.3 kg) IBW/kg (Calculated) : 50.1 Heparin Dosing Weight: 67kg  Vital Signs: Temp: 97.3 F (36.3 C) (11/04 0400) Temp Source: Axillary (11/04 0400) BP: 121/72 (11/04 0500) Pulse Rate: 72 (11/04 0729)  Labs:  Recent Labs  12/03/15 1435 12/03/15 2009 12/03/15 2308 12/04/15 0511  HGB 16.8*  --   --  15.9*  HCT 49.8*  --   --  48.5*  PLT 353  --   --  319  APTT  --   --  34  --   LABPROT  --   --  13.8  --   INR  --   --  1.05  --   HEPARINUNFRC  --   --   --  0.68  CREATININE 0.72  0.65  --   --  0.63  TROPONINI 1.18* 1.73*  --  1.21*   Estimated Creatinine Clearance: 65.3 mL/min (by C-G formula based on SCr of 0.63 mg/dL).  Medical History: Past Medical History:  Diagnosis Date  . Acute DVT (deep venous thrombosis) ([facility])   . Allergic rhinitis   . Atrophic vaginitis   . COPD (chronic obstructive pulmonary disease) ([facility])   . Hypertension   . Low back pain   . Umbilical hernia    Medications:  Scheduled:  . aspirin EC  81 mg Oral Daily  . azithromycin  500 mg Oral Daily  . cefTRIAXone (ROCEPHIN)  IV  1 g Intravenous Q24H  . ipratropium-albuterol  3 mL Nebulization Q6H  . methylPREDNISolone (SOLU-MEDROL) injection  60 mg Intravenous Q6H  . pantoprazole  40 mg Oral Daily   Infusions:  . heparin 800 Units/hr (12/03/15 2300)   Assessment: 65 [facility]F [facility] of Leva[facility] & Prednisone, hx COPD. Baseline Troponin elevated, 2nd level increased, probable underlying CAD, begin ASA and Heparin infusion for ACS.  -Lovenox 40mg  given x1 this evening just prior to Heparin protocol order - aPTT/INR ordered, no anti-coag PTA  Today, 12/04/2015 HL 0.68 (therapeutic) H/H stable APTT/PT/INR all WNL Plts WNL  Goal of Therapy:  Heparin level 0.3-0.7  units/ml Monitor platelets by anticoagulation protocol: Yes   Plan:   continue infusion at 800 units/hr  Check 6 hr Heparin level  Daily CBC ordered  Arley PhenixEllen Koda Routon RPh 12/04/2015, 7:48 AM Pager [phone]

## 2015-12-04 NOTE — Progress Notes (Signed)
Name: Erin Morrison MRN: [id number] DOB: 02/04/1950    ADMISSION DATE:  12/03/2015  CHIEF COMPLAINT:  Respiratory distress  HISTORY OF PRESENT ILLNESS:  65 year old with moderate COPD From 11/3:  She was maintained on Symbicort and Spiriva earlier She was changed to Pulmicort with Perforomist She does not like this regimen She developed URI symptoms followed by a chest cold-was given prednisone 60 mg dose pack and Levaquin by her PCP which she completed-she continues to report significant shortness of breath and wheezing-accompanied by her daughter-in law [name] today Is hardly able to walk a few steps-almost called 911 last night Has taken albuterol nebs 3 since morning  Interval events:  Admitted 11/3 with AE-copd, possible RLL infiltrate Has required BiPAP through the night, currently on and tolerating  Started on heparin last pm for trop 1.18 > 1.73 > 1.21; TTE done and pending  Significant tests/ events  PFT 07/13/15: FVC 2.09 L (73%) FEV1 1.45 L (66%) FEV1/FVC 0.69 FEF 25-75 0.92 L (47%) positive bronchodilator response TLC 5.56 liters (117%) RV 153% ERV 37% DLCO uncorrected 62%     TTE 11/4 >>   VITAL SIGNS: Temp:  [96.6 F (35.9 C)-99.2 F (37.3 C)] 98.8 F (37.1 C) (11/04 0800) Pulse Rate:  [72-113] 72 (11/04 0729) Resp:  [15-31] 17 (11/04 0729) BP: (104-153)/(60-98) 121/72 (11/04 0500) SpO2:  [86 %-99 %] 96 % (11/04 0729) FiO2 (%):  [50 %] 50 % (11/04 0605) Weight:  [76.3 kg (168 lb 3.4 oz)-77.2 kg (170 lb 3.2 oz)] 76.3 kg (168 lb 3.4 oz) (11/04 13240605)  PHYSICAL EXAMINATION:  Gen. Pleasant, well-nourished, in no distress on biPAP  ENT - no lesions, no post nasal drip Neck: No JVD, no thyromegaly, no carotid bruits Lungs: no use of accessory muscles, soft B exp wheeze, L > R Cardiovascular: Rhythm regular, heart sounds  normal, no murmurs or gallops, no peripheral edema Musculoskeletal: No deformities, no cyanosis or clubbing   Recent Labs Lab  12/03/15 1435 12/04/15 0511  NA 139 136  K 4.1 4.4  CL 105 101  CO2 19* 25  BUN 11 14  CREATININE 0.72  0.65 0.63  GLUCOSE 117* 149*    Recent Labs Lab 12/03/15 1435 12/04/15 0511  HGB 16.8* 15.9*  HCT 49.8* 48.5*  WBC 15.0* 11.7*  PLT 353 319   Dg Chest 2 View  Result Date: 12/03/2015 CLINICAL DATA:  COPD exacerbation. Worsening shortness of breath for 2 weeks with left-sided chest pain and cough. EXAM: CHEST  2 VIEW COMPARISON:  02/25/2011 FINDINGS: The cardiac silhouette is borderline enlarged, accentuated by AP technique. Aortic atherosclerosis is noted. There is new mild elevation of the right hemidiaphragm increased AP diameter of the chest is again seen consistent with hyperinflation and underlying COPD. Airway thickening and interstitial coarsening have increased from the prior study. There is minimal scarring or atelectasis in the left lung base. No lobar consolidation, edema, pleural effusion, or pneumothorax is identified. No acute osseous abnormality is seen. IMPRESSION: Increased bronchitic changes. Electronically Signed   By: Sebastian AcheAllen  Grady M.D.   On: 12/03/2015 15:15   Dg Chest Port 1 View  Result Date: 12/03/2015 CLINICAL DATA:  Sudden onset of increased shortness of breath and dyspnea, history hypertension, COPD, former smoker EXAM: PORTABLE CHEST 1 VIEW COMPARISON:  Portable exam 1704 hours compared to 12/03/2015 at 1455 hours FINDINGS: Normal heart size, mediastinal contours, and pulmonary vascularity. Bronchitic changes with slight increase in RIGHT perihilar markings since the earlier exam question developing pneumonia. Linear  subsegmental atelectasis LEFT base. Remaining lungs clear. No pleural effusion or pneumothorax. IMPRESSION: Bronchitic changes with questionable RIGHT basilar infiltrate. Electronically Signed   By: Ulyses SouthwardMark  Boles M.D.   On: 12/03/2015 18:04    ASSESSMENT / PLAN:  Acute exacerbation of COPD- Solumedrol 60mg  q6. Hx of chronic pred 10mg   daily DuoNeb scheduled.  Restart outpt daliresp, singulair, zyrtec when able to take PO  Probable RLL CAP azithro and ceftriaxone started 11/3  Acute Resp failure due to the above BiPAP as needed today, would like to give her a break when she is ready to try NPO until she can tolerate off BiPAP  Stress-related NSTEMI, suspected underlying CAD Heparin gtt, ASA TTE performed and pending Appreciate Dr Ludwig Clarksrenshaw's assistance.   Hx HTN Not on home BP regimen  Hx GERD/ GI prophylaxis protonix ordered  DVT prophylaxis Currently on heparin gtt   Levy Pupaobert Byrum, MD, PhD 12/04/2015, 9:56 AM [facility] [phone] or if no answer [phone]

## 2015-12-04 NOTE — Progress Notes (Signed)
Per MD- PT BiPAP order has been modified to PRN. RT entered PT room to discover PT off BiPAP and on PRB. RT placed PT on 8 lpm HFNC. PT does not appear to be in respiratory distress at this time. PT states breathing is "OK."  RN aware.

## 2015-12-04 NOTE — Progress Notes (Signed)
  Echocardiogram 2D Echocardiogram has been performed.  Janalyn HarderWest, Sloane Palmer R 12/04/2015, 8:56 AM

## 2015-12-04 NOTE — Progress Notes (Signed)
ANTICOAGULATION CONSULT NOTE -  Pharmacy Consult for Heparin Indication: chest pain/ACS  No Known Allergies  Patient Measurements: Height: 5\' 2"  (157.5 cm) Weight: 168 lb 3.4 oz (76.3 kg) IBW/kg (Calculated) : 50.1 Heparin Dosing Weight: 67kg  Vital Signs: Temp: 98.8 F (37.1 C) (11/04 0800) Temp Source: Oral (11/04 0800) BP: 142/79 (11/04 1050) Pulse Rate: 84 (11/04 1137)  Labs:  Recent Labs  12/03/15 1435 12/03/15 2009 12/03/15 2308 12/04/15 0511 12/04/15 1039  HGB 16.8*  --   --  15.9*  --   HCT 49.8*  --   --  48.5*  --   PLT 353  --   --  319  --   APTT  --   --  34  --   --   LABPROT  --   --  13.8  --   --   INR  --   --  1.05  --   --   HEPARINUNFRC  --   --   --  0.68 0.58  CREATININE 0.72  0.65  --   --  0.63  --   TROPONINI 1.18* 1.73*  --  1.21* 0.92*   Estimated Creatinine Clearance: 65.3 mL/min (by C-G formula based on SCr of 0.63 mg/dL).  Medical History: Past Medical History:  Diagnosis Date  . Acute DVT (deep venous thrombosis) ([facility])   . Allergic rhinitis   . Atrophic vaginitis   . COPD (chronic obstructive pulmonary disease) ([facility])   . Hypertension   . Low back pain   . Umbilical hernia    Medications:  Scheduled:  . aspirin EC  81 mg Oral Daily  . azithromycin  500 mg Oral Daily  . cefTRIAXone (ROCEPHIN)  IV  1 g Intravenous Q24H  . ipratropium-albuterol  3 mL Nebulization Q6H  . methylPREDNISolone (SOLU-MEDROL) injection  60 mg Intravenous Q6H  . pantoprazole  40 mg Oral Daily   Infusions:  . heparin 800 Units/hr (12/03/15 2300)   Assessment: 65 [facility] of Lev[facility] & Prednisone, hx COPD. Baseline Troponin elevated, 2nd level increased, probable underlying CAD, begin ASA and Heparin infusion for ACS.  -Lovenox 40mg  given x1 this evening just prior to Heparin protocol order - aPTT/INR ordered, no anti-coag PTA  Today, 12/04/2015 confirmatory HL 0.58 (therapeutic) H/H stable APTT/PT/INR all  WNL Plts WNL No issues per RN  Goal of Therapy:  Heparin level 0.3-0.7 units/ml Monitor platelets by anticoagulation protocol: Yes   Plan:   continue infusion at 800 units/hr  Daily Heparin level/CBC   Arley Phenixllen Lori Popowski RPh 12/04/2015, 12:04 PM Pager [phone]

## 2015-12-05 LAB — CBC
HCT: 47.1 % — ABNORMAL HIGH (ref 36.0–46.0)
HEMOGLOBIN: 15.3 g/dL — AB (ref 12.0–15.0)
MCH: 30.6 pg (ref 26.0–34.0)
MCHC: 32.5 g/dL (ref 30.0–36.0)
MCV: 94.2 fL (ref 78.0–100.0)
Platelets: 283 10*3/uL (ref 150–400)
RBC: 5 MIL/uL (ref 3.87–5.11)
RDW: 13.5 % (ref 11.5–15.5)
WBC: 23.4 10*3/uL — ABNORMAL HIGH (ref 4.0–10.5)

## 2015-12-05 LAB — HEPARIN LEVEL (UNFRACTIONATED): Heparin Unfractionated: 0.41 IU/mL (ref 0.30–0.70)

## 2015-12-05 LAB — BASIC METABOLIC PANEL
Anion gap: 9 (ref 5–15)
BUN: 21 mg/dL — AB (ref 6–20)
CHLORIDE: 103 mmol/L (ref 101–111)
CO2: 26 mmol/L (ref 22–32)
Calcium: 9.2 mg/dL (ref 8.9–10.3)
Creatinine, Ser: 0.74 mg/dL (ref 0.44–1.00)
GFR calc Af Amer: >60 mL/min (ref >60)
GFR calc non Af Amer: >60 mL/min (ref >60)
GLUCOSE: 155 mg/dL — AB (ref 65–99)
POTASSIUM: 4.2 mmol/L (ref 3.5–5.1)
Sodium: 138 mmol/L (ref 135–145)

## 2015-12-05 LAB — HEMOGLOBIN A1C
HEMOGLOBIN A1C: 5.5 % (ref 4.8–5.6)
MEAN PLASMA GLUCOSE: 111 mg/dL

## 2015-12-05 MED ORDER — ATORVASTATIN CALCIUM 40 MG PO TABS
40.0000 mg | ORAL_TABLET | Freq: Every day | ORAL | Status: DC
Start: 2015-12-05 — End: 2015-12-10
  Administered 2015-12-05 – 2015-12-09 (×5): 40 mg via ORAL
  Filled 2015-12-05 (×5): qty 1

## 2015-12-05 MED ORDER — LORATADINE 10 MG PO TABS
10.0000 mg | ORAL_TABLET | Freq: Every day | ORAL | Status: DC
  Administered 2015-12-05 – 2015-12-10 (×6): 10 mg via ORAL
  Filled 2015-12-05 (×6): qty 1

## 2015-12-05 MED ORDER — ALPRAZOLAM 0.5 MG PO TABS
0.5000 mg | ORAL_TABLET | Freq: Once | ORAL | Status: AC
  Administered 2015-12-05: 0.5 mg via ORAL
  Filled 2015-12-05: qty 1

## 2015-12-05 MED ORDER — MONTELUKAST SODIUM 10 MG PO TABS
10.0000 mg | ORAL_TABLET | Freq: Every day | ORAL | Status: DC
  Administered 2015-12-05 – 2015-12-09 (×5): 10 mg via ORAL
  Filled 2015-12-05 (×5): qty 1

## 2015-12-05 MED ORDER — ROFLUMILAST 500 MCG PO TABS
500.0000 ug | ORAL_TABLET | Freq: Every day | ORAL | Status: DC
  Administered 2015-12-05 – 2015-12-10 (×6): 500 ug via ORAL
  Filled 2015-12-05 (×6): qty 1

## 2015-12-05 NOTE — Progress Notes (Signed)
Name: Erin Morrison MRN: [id number] DOB: 11/11/1950    ADMISSION DATE:  12/03/2015  CHIEF COMPLAINT:  Respiratory distress  HISTORY OF PRESENT ILLNESS:  65 year old with moderate COPD From 11/3:  She was maintained on Symbicort and Spiriva earlier She was changed to Pulmicort with Perforomist She does not like this regimen She developed URI symptoms followed by a chest cold-was given prednisone 60 mg dose pack and Levaquin by her PCP which she completed-she continues to report significant shortness of breath and wheezing-accompanied by her daughter-in law [name] today Is hardly able to walk a few steps-almost called 911 last night Has taken albuterol nebs 3 since morning  Interval events:  Was able to stay off BiPAP for much of the day yesterday BiPAP overnight - had an episode of acute dyspnea this am when her biPAP was removed, now improved  Significant tests/ events  PFT 07/13/15: FVC 2.09 L (73%) FEV1 1.45 L (66%) FEV1/FVC 0.69 FEF 25-75 0.92 L (47%) positive bronchodilator response TLC 5.56 liters (117%) RV 153% ERV 37% DLCO uncorrected 62%     TTE 11/4 >> normal LV size, mild LVH and diastolic dysfxn, EF 45-50%. Normal RV size and fxn  VITAL SIGNS: Temp:  [97.2 F (36.2 C)-98.5 F (36.9 C)] 98 F (36.7 C) (11/05 0800) Pulse Rate:  [74-102] 84 (11/05 0821) Resp:  [14-26] 17 (11/05 0821) BP: (116-162)/(42-82) 162/82 (11/05 0800) SpO2:  [92 %-99 %] 98 % (11/05 0821) FiO2 (%):  [40 %-50 %] 50 % (11/05 0800)  PHYSICAL EXAMINATION:  Gen. Pleasant, well-nourished, in no distress up to chair off BiPAP ENT - no lesions, no post nasal drip Neck: No JVD, no thyromegaly, no carotid bruits Lungs: no use of accessory muscles, soft B exp wheeze Cardiovascular: Rhythm regular, heart sounds  normal, no murmurs or gallops, no peripheral edema Musculoskeletal: No deformities, no cyanosis or clubbing   Recent Labs Lab 12/03/15 1435 12/04/15 0511 12/05/15 0344  NA 139  136 138  K 4.1 4.4 4.2  CL 105 101 103  CO2 19* 25 26  BUN 11 14 21*  CREATININE 0.72  0.65 0.63 0.74  GLUCOSE 117* 149* 155*    Recent Labs Lab 12/03/15 1435 12/04/15 0511 12/05/15 0344  HGB 16.8* 15.9* 15.3*  HCT 49.8* 48.5* 47.1*  WBC 15.0* 11.7* 23.4*  PLT 353 319 283   Dg Chest 2 View  Result Date: 12/03/2015 CLINICAL DATA:  COPD exacerbation. Worsening shortness of breath for 2 weeks with left-sided chest pain and cough. EXAM: CHEST  2 VIEW COMPARISON:  02/25/2011 FINDINGS: The cardiac silhouette is borderline enlarged, accentuated by AP technique. Aortic atherosclerosis is noted. There is new mild elevation of the right hemidiaphragm increased AP diameter of the chest is again seen consistent with hyperinflation and underlying COPD. Airway thickening and interstitial coarsening have increased from the prior study. There is minimal scarring or atelectasis in the left lung base. No lobar consolidation, edema, pleural effusion, or pneumothorax is identified. No acute osseous abnormality is seen. IMPRESSION: Increased bronchitic changes. Electronically Signed   By: Sebastian AcheAllen  Grady M.D.   On: 12/03/2015 15:15   Dg Chest Port 1 View  Result Date: 12/03/2015 CLINICAL DATA:  Sudden onset of increased shortness of breath and dyspnea, history hypertension, COPD, former smoker EXAM: PORTABLE CHEST 1 VIEW COMPARISON:  Portable exam 1704 hours compared to 12/03/2015 at 1455 hours FINDINGS: Normal heart size, mediastinal contours, and pulmonary vascularity. Bronchitic changes with slight increase in RIGHT perihilar markings since the earlier exam  question developing pneumonia. Linear subsegmental atelectasis LEFT base. Remaining lungs clear. No pleural effusion or pneumothorax. IMPRESSION: Bronchitic changes with questionable RIGHT basilar infiltrate. Electronically Signed   By: Ulyses SouthwardMark  Boles M.D.   On: 12/03/2015 18:04    ASSESSMENT / PLAN:  Acute exacerbation of COPD- Solumedrol 60mg  q6. Hx of  chronic pred 10mg  daily DuoNeb scheduled.  Restarted outpt daliresp, singulair, zyrtec 11/5  Probable RLL CAP azithro and ceftriaxone started 11/3  Acute Resp failure due to the above BiPAP as needed today, goal off all day then use tonight Low dose xanax has helped, will repeat this am  Stress-related NSTEMI, suspected underlying CAD Heparin gtt, ASA TTE performed as above Appreciate Dr Ludwig Clarksrenshaw's assistance.   Hx HTN Not on home BP regimen  Hx GERD/ GI prophylaxis protonix ordered  DVT prophylaxis Currently on heparin gtt   Levy Pupaobert Jerimyah Vandunk, MD, PhD 12/05/2015, 8:37 AM [facility] [phone] or if no answer [phone]

## 2015-12-05 NOTE — Progress Notes (Signed)
Called to Pt room by RN.  Pt states she was resting fine on BiPAP but became significantly and suddenly SOB after a coughing spell with a corresponding SpO2 drop.  BiPAP pressures adjusted and breathing treatment given.  Pt denies any focal pain, breath sounds and chest rise equal on both sides.  Pt states that breathing treatment and adjusted pressures are helping.  Vital signs and breath sounds as charted.  RN in room to assist and aware.  RT will continue to monitor as needed.

## 2015-12-05 NOTE — Progress Notes (Signed)
ANTICOAGULATION CONSULT NOTE -  Pharmacy Consult for Heparin Indication: chest pain/ACS  No Known Allergies  Patient Measurements: Height: 5\' 2"  (157.5 cm) Weight: 168 lb 3.4 oz (76.3 kg) IBW/kg (Calculated) : 50.1 Heparin Dosing Weight: 67kg  Vital Signs: Temp: 97.6 F (36.4 C) (11/05 0400) Temp Source: Axillary (11/05 0400) BP: 127/74 (11/05 0400) Pulse Rate: 102 (11/05 0558)  Labs:  Recent Labs  12/03/15 1435 12/03/15 2009 12/03/15 2308 12/04/15 0511 12/04/15 1039 12/05/15 0344  HGB 16.8*  --   --  15.9*  --  15.3*  HCT 49.8*  --   --  48.5*  --  47.1*  PLT 353  --   --  319  --  283  APTT  --   --  34  --   --   --   LABPROT  --   --  13.8  --   --   --   INR  --   --  1.05  --   --   --   HEPARINUNFRC  --   --   --  0.68 0.58 0.41  CREATININE 0.72  0.65  --   --  0.63  --  0.74  TROPONINI 1.18* 1.73*  --  1.21* 0.92*  --    Estimated Creatinine Clearance: 65.3 mL/min (by C-G formula based on SCr of 0.74 mg/dL).  Medical History: Past Medical History:  Diagnosis Date  . Acute DVT (deep venous thrombosis) (HCC)   . Allergic rhinitis   . Atrophic vaginitis   . COPD (chronic obstructive pulmonary disease) (HCC)   . Hypertension   . Low back pain   . Umbilical hernia    Medications:  Scheduled:  . aspirin EC  81 mg Oral Daily  . azithromycin  500 mg Oral Daily  . cefTRIAXone (ROCEPHIN)  IV  1 g Intravenous Q24H  . ipratropium-albuterol  3 mL Nebulization Q6H  . methylPREDNISolone (SOLU-MEDROL) injection  60 mg Intravenous Q6H  . pantoprazole  40 mg Oral Daily   Infusions:  . heparin 800 Units/hr (12/05/15 16100338)   Assessment: 65 [facility] & Pred[facility], hx COPD. Baseline Troponin elevated, 2nd level increased, probable underlying CAD, begin ASA and Heparin infusion for ACS.  -Lovenox 40mg  given x1 this evening just prior to Heparin protocol order - aPTT/INR ordered, no anti-coag PTA  Today, 12/05/2015  HL 0.41 (therapeutic) H/H stable APTT/PT/INR all WNL Plts WNL No issues per RN  Goal of Therapy:  Heparin level 0.3-0.7 units/ml Monitor platelets by anticoagulation protocol: Yes   Plan:   continue infusion at 800 units/hr  Daily Heparin level/CBC   Arley Phenixllen Tristen Luce RPh 12/05/2015, 7:09 AM Pager [phone]

## 2015-12-05 NOTE — Progress Notes (Signed)
RT informed by RT that PT was removed from BiPAP and placed on 10 lpm HFNC to complete mouth care. PER MD to RN, leave PT off BiPAP so MD can assess. Currently PT in bedside chair and eating breakfast.

## 2015-12-05 NOTE — Progress Notes (Signed)
    Subjective:  Denies CP or dyspnea   Objective:  Vitals:   12/05/15 0339 12/05/15 0400 12/05/15 0558 12/05/15 0800  BP:  127/74  (!) 162/82  Pulse: 74 74 (!) 102 (!) 101  Resp: 16 16 19  (!) 21  Temp:  97.6 F (36.4 C)    TempSrc:  Axillary    SpO2: 96% 96% 99% 96%  Weight:      Height:        Intake/Output from previous day:  Intake/Output Summary (Last 24 hours) at 12/05/15 0819 Last data filed at 12/05/15 16100733  Gross per 24 hour  Intake            246.4 ml  Output              860 ml  Net           -613.6 ml    Physical Exam: Physical exam: Well-developed chronically ill appearing in no acute distress.  Skin is warm and dry.  HEENT is normal.  Neck is supple.  Chest is clear to auscultation with normal expansion.  Cardiovascular exam is regular rate and rhythm.  Abdominal exam nontender or distended. No masses palpated. Extremities show no edema. neuro grossly intact    Lab Results: Basic Metabolic Panel:  Recent Labs  96/04/5409/03/17 1435 12/04/15 0511 12/05/15 0344  NA 139 136 138  K 4.1 4.4 4.2  CL 105 101 103  CO2 19* 25 26  GLUCOSE 117* 149* 155*  BUN 11 14 21*  CREATININE 0.72  0.65 0.63 0.74  CALCIUM 9.6 9.6 9.2  MG 2.0 2.1  --   PHOS 4.0 4.3  --    CBC:  Recent Labs  12/03/15 1435 12/04/15 0511 12/05/15 0344  WBC 15.0* 11.7* 23.4*  NEUTROABS 11.2* 10.1*  --   HGB 16.8* 15.9* 15.3*  HCT 49.8* 48.5* 47.1*  MCV 92.7 93.6 94.2  PLT 353 319 283   Cardiac Enzymes:  Recent Labs  12/03/15 2009 12/04/15 0511 12/04/15 1039  TROPONINI 1.73* 1.21* 0.92*     Assessment/Plan:  65 year old female admitted with COPD/URI symptoms and chest pain. Troponin noted to be abnormal and cardiology asked to evaluate.  1 elevated troponin-I have reviewed the patient's electrocardiograms and there are no acute ST changes. She is not actively having chest pain. Echocardiogram does show WMA. Will need cardiac cath when pulmonary status improves  (risks and benefits discussed including MI, CVA and death and she agrees to proceed; ? Tues or Wed. Continue aspirin and heparin. No beta blockade given COPD. Add statin.  2 upper respiratory infection/COPD-antibiotics, bronchodilators and steroids.  3 history of hypertension-follow blood pressure and adjust medicines as needed.  4 history of DVT   Olga MillersBrian Tyja Gortney 12/05/2015, 8:19 AM

## 2015-12-06 DIAGNOSIS — I214 Non-ST elevation (NSTEMI) myocardial infarction: Secondary | ICD-10-CM

## 2015-12-06 DIAGNOSIS — E785 Hyperlipidemia, unspecified: Secondary | ICD-10-CM

## 2015-12-06 HISTORY — DX: Hyperlipidemia, unspecified: E78.5

## 2015-12-06 HISTORY — DX: Non-ST elevation (NSTEMI) myocardial infarction: I21.4

## 2015-12-06 LAB — CBC
HCT: 47.2 % — ABNORMAL HIGH (ref 36.0–46.0)
Hemoglobin: 15.5 g/dL — ABNORMAL HIGH (ref 12.0–15.0)
MCH: 31.1 pg (ref 26.0–34.0)
MCHC: 32.8 g/dL (ref 30.0–36.0)
MCV: 94.6 fL (ref 78.0–100.0)
PLATELETS: 281 10*3/uL (ref 150–400)
RBC: 4.99 MIL/uL (ref 3.87–5.11)
RDW: 13.5 % (ref 11.5–15.5)
WBC: 20.2 10*3/uL — ABNORMAL HIGH (ref 4.0–10.5)

## 2015-12-06 LAB — HEPARIN LEVEL (UNFRACTIONATED): HEPARIN UNFRACTIONATED: 0.4 [IU]/mL (ref 0.30–0.70)

## 2015-12-06 MED ORDER — ALPRAZOLAM 0.25 MG PO TABS
0.2500 mg | ORAL_TABLET | Freq: Two times a day (BID) | ORAL | Status: DC | PRN
  Administered 2015-12-06 – 2015-12-10 (×9): 0.25 mg via ORAL
  Filled 2015-12-06 (×9): qty 1

## 2015-12-06 MED ORDER — HEPARIN SODIUM (PORCINE) 5000 UNIT/ML IJ SOLN
5000.0000 [IU] | Freq: Three times a day (TID) | INTRAMUSCULAR | Status: DC
  Administered 2015-12-06 – 2015-12-08 (×6): 5000 [IU] via SUBCUTANEOUS
  Filled 2015-12-06 (×6): qty 1

## 2015-12-06 MED ORDER — ISOSORBIDE MONONITRATE 15 MG HALF TABLET
15.0000 mg | ORAL_TABLET | Freq: Every day | ORAL | Status: DC
  Administered 2015-12-06: 15 mg via ORAL
  Filled 2015-12-06 (×2): qty 1

## 2015-12-06 NOTE — Progress Notes (Addendum)
Patient Name: Erin Morrison Date of Encounter: 12/06/2015  [facility]   Hospital Problem List     Principal Problem:   COPD exacerbation [facility](HCC) Active Problems:   HTN (hypertension)   Community acquired pneumonia of right lower lobe of lung (HCC)   Acute respiratory failure with hypoxia (HCC)   NSTEMI (non-ST elevated myocardial infarction) (HCC)   Hyperlipidemia    Subjective   Reports dyspnea x 1 yr. Still feeling SOB with any activity, also with wheezing. No chest pain.  Inpatient Medications    . aspirin EC  81 mg Oral Daily  . atorvastatin  40 mg Oral q1800  . azithromycin  500 mg Oral Daily  . cefTRIAXone (ROCEPHIN)  IV  1 g Intravenous Q24H  . ipratropium-albuterol  3 mL Nebulization Q6H  . loratadine  10 mg Oral Daily  . methylPREDNISolone (SOLU-MEDROL) injection  60 mg Intravenous Q6H  . montelukast  10 mg Oral QHS  . pantoprazole  40 mg Oral Daily  . roflumilast  500 mcg Oral Daily    Vital Signs    Vitals:   12/06/15 0500 12/06/15 0800 12/06/15 0819 12/06/15 0820  BP:  (!) 181/129  (!) 153/81  Pulse:  81 100 98  Resp:  18 (!) 22 20  Temp:  97.8 F (36.6 C)    TempSrc:  Oral    SpO2:  97% 95% 98%  Weight: 167 lb 15.9 oz (76.2 kg)     Height:        Intake/Output Summary (Last 24 hours) at 12/06/15 0902 Last data filed at 12/06/15 0600  Gross per 24 hour  Intake            291.6 ml  Output              200 ml  Net             91.6 ml   Filed Weights   12/04/15 0605 12/05/15 1746 12/06/15 0500  Weight: 168 lb 3.4 oz (76.3 kg) 166 lb 10.7 oz (75.6 kg) 167 lb 15.9 oz (76.2 kg)    Physical Exam    General: Well developed chronically ill appearing WF in no acute distress HEENT: Normocephalic, atraumatic, sclera non-icteric, no xanthomas, nares are without discharge. Neck: Negative for carotid bruits. JVP not elevated. Lungs: Diffusely diminished with diffuse quiet expiratory wheezing. Breathing is unlabored. Cardiac: RRR  S1 S2 without murmurs, rubs, or gallops.  Abdomen: Soft, non-tender, non-distended with normoactive bowel sounds. No rebound/guarding. Extremities: No clubbing or cyanosis. No edema. Distal pedal pulses are 2+ and equal bilaterally. Skin: Warm and dry, no significant rash. Neuro: Alert and oriented X 3. Sensation in tact. Follows commands. Psych:  Responds to questions appropriately with a normal affect.  Labs    CBC  Recent Labs  12/03/15 1435 12/04/15 0511 12/05/15 0344 12/06/15 0328  WBC 15.0* 11.7* 23.4* 20.2*  NEUTROABS 11.2* 10.1*  --   --   HGB 16.8* 15.9* 15.3* 15.5*  HCT 49.8* 48.5* 47.1* 47.2*  MCV 92.7 93.6 94.2 94.6  PLT 353 319 283 281   Basic Metabolic Panel  Recent Labs  12/03/15 1435 12/04/15 0511 12/05/15 0344  NA 139 136 138  K 4.1 4.4 4.2  CL 105 101 103  CO2 19* 25 26  GLUCOSE 117* 149* 155*  BUN 11 14 21*  CREATININE 0.72  0.65 0.63 0.74  CALCIUM 9.6 9.6 9.2  MG 2.0 2.1  --   PHOS 4.0 4.3  --  Liver Function Tests  Recent Labs  12/03/15 1435 12/04/15 0511  AST 26  --   ALT 21  --   ALKPHOS 62  --   BILITOT 0.7  --   PROT 7.4  --   ALBUMIN 4.7 4.1   No results for input(s): LIPASE, AMYLASE in the last 72 hours. Cardiac Enzymes  Recent Labs  12/03/15 2009 12/04/15 0511 12/04/15 1039  TROPONINI 1.73* 1.21* 0.92*   BNP Invalid input(s): POCBNP D-Dimer No results for input(s): DDIMER in the last 72 hours. Hemoglobin A1C  Recent Labs  12/04/15 0511  HGBA1C 5.5   Fasting Lipid Panel  Recent Labs  12/04/15 0511  CHOL 207*  HDL 57  LDLCALC 134*  TRIG 79  CHOLHDL 3.6   Thyroid Function Tests  Recent Labs  12/04/15 0511  TSH 0.750    Telemetry    NSR occ PACs, PVCs  Radiology    Dg Chest 2 View  Result Date: 12/03/2015 CLINICAL DATA:  COPD exacerbation. Worsening shortness of breath for 2 weeks with left-sided chest pain and cough. EXAM: CHEST  2 VIEW COMPARISON:  02/25/2011 FINDINGS: The cardiac  silhouette is borderline enlarged, accentuated by AP technique. Aortic atherosclerosis is noted. There is new mild elevation of the right hemidiaphragm increased AP diameter of the chest is again seen consistent with hyperinflation and underlying COPD. Airway thickening and interstitial coarsening have increased from the prior study. There is minimal scarring or atelectasis in the left lung base. No lobar consolidation, edema, pleural effusion, or pneumothorax is identified. No acute osseous abnormality is seen. IMPRESSION: Increased bronchitic changes. Electronically Signed   By: Sebastian AcheAllen  Grady M.D.   On: 12/03/2015 15:15   Dg Chest Port 1 View  Result Date: 12/03/2015 CLINICAL DATA:  Sudden onset of increased shortness of breath and dyspnea, history hypertension, COPD, former smoker EXAM: PORTABLE CHEST 1 VIEW COMPARISON:  Portable exam 1704 hours compared to 12/03/2015 at 1455 hours FINDINGS: Normal heart size, mediastinal contours, and pulmonary vascularity. Bronchitic changes with slight increase in RIGHT perihilar markings since the earlier exam question developing pneumonia. Linear subsegmental atelectasis LEFT base. Remaining lungs clear. No pleural effusion or pneumothorax. IMPRESSION: Bronchitic changes with questionable RIGHT basilar infiltrate. Electronically Signed   By: Ulyses SouthwardMark  Boles M.D.   On: 12/03/2015 18:04     Patient Profile     44F with COPD, former tobacco, previous DVT, HTN admitted with acute exacerbation of COPD, probable RLL CAP & acute respiratory failure requiring BiPAP, found to have elevated troponin up to 1.73. 2D Echo 12/04/15: mild LVH, EF 45-50%, akinesis of mid anteroseptal myocardium and mid inferolateral/inferior myocardium, grade 1 DD.  Assessment & Plan    1. NSTEMI - continue ASA, statin. Heparin stopped today by PCCM (>48 hours tx), reviewed with Dr. Katrinka BlazingSmith - OK given no active chest pain. Will write for pharmacy to convert to DVT ppx. Not currently on BB given her  AECOPD. Will need LHC when medically improved. Do not feel she is there yet.  2. AECOPD/CAP - per PCCM. Leukocytosis may be 2/2 steroids.  3. HTN - BP 120s-150s overnight. May be exacerbated by steroid therapy. No BB given COPD. Consider addition of amlodipine if BP remains elevated.  4. Hyperlipidemia - LDL 134. If found to have CAD may consider escalating statin further to 80mg  daily with f/u labs as OP.  Signed, Laurann Montanaayna N Dunn, PA-C  12/06/2015, 9:02 AM   The patient has been seen in conjunction with Ronie Spiesayna Dunn, PA-C. All  aspects of care have been considered and discussed. The patient has been personally interviewed, examined, and all clinical data has been reviewed.   Agree with the above note as outlined. Continue aspirin, consider high intensity statin therapy, and coronary angiography to define anatomy. We will add low dose long-acting nitrates.  Patient has regional wall motion abnormality on echo, LVEF 45%, and elevated/flat troponin evaluation. Significant obstructive CAD needs to be excluded.  White count is increasing, possibly related to therapy which includes steroids.

## 2015-12-06 NOTE — Progress Notes (Addendum)
ANTICOAGULATION CONSULT NOTE -  Pharmacy Consult for Heparin Indication: chest pain/ACS  No Known Allergies  Patient Measurements: Height: 5\' 2"  (157.5 cm) Weight: 167 lb 15.9 oz (76.2 kg) IBW/kg (Calculated) : 50.1 Heparin Dosing Weight: 67kg  Vital Signs: Temp: 97.3 F (36.3 C) (11/06 0400) Temp Source: Axillary (11/06 0400) BP: 138/77 (11/06 0400) Pulse Rate: 66 (11/06 0400)  Labs:  Recent Labs  12/03/15 1435 12/03/15 2009 12/03/15 2308  12/04/15 0511 12/04/15 1039 12/05/15 0344 12/06/15 0328  HGB 16.8*  --   --   --  15.9*  --  15.3* 15.5*  HCT 49.8*  --   --   --  48.5*  --  47.1* 47.2*  PLT 353  --   --   --  319  --  283 281  APTT  --   --  34  --   --   --   --   --   LABPROT  --   --  13.8  --   --   --   --   --   INR  --   --  1.05  --   --   --   --   --   HEPARINUNFRC  --   --   --   < > 0.68 0.58 0.41 0.40  CREATININE 0.72  0.65  --   --   --  0.63  --  0.74  --   TROPONINI 1.18* 1.73*  --   --  1.21* 0.92*  --   --   < > = values in this interval not displayed. Estimated Creatinine Clearance: 65.2 mL/min (by C-G formula based on SCr of 0.74 mg/dL).  Medical History: Past Medical History:  Diagnosis Date  . Acute DVT (deep venous thrombosis) (HCC)   . Allergic rhinitis   . Atrophic vaginitis   . COPD (chronic obstructive pulmonary disease) (HCC)   . Hypertension   . Low back pain   . Umbilical hernia    Medications:  Scheduled:  . aspirin EC  81 mg Oral Daily  . atorvastatin  40 mg Oral q1800  . azithromycin  500 mg Oral Daily  . cefTRIAXone (ROCEPHIN)  IV  1 g Intravenous Q24H  . ipratropium-albuterol  3 mL Nebulization Q6H  . loratadine  10 mg Oral Daily  . methylPREDNISolone (SOLU-MEDROL) injection  60 mg Intravenous Q6H  . montelukast  10 mg Oral QHS  . pantoprazole  40 mg Oral Daily  . roflumilast  500 mcg Oral Daily   Infusions:  . heparin 800 Units/hr (12/06/15 0600)   Assessment: [age] yoF admitted with COPD/CAP found to have  NSTEMI and started on IV heparin.  Plan for cardiac cath when pulmonary status improves -possibly this Tues or Wed.    Today, 12/06/2015 Heparin level remains therapeutic on current infusion rate.   No issues noted with CBC.  No signs or symptoms of bleeding per RN   Goal of Therapy:  Heparin level 0.3-0.7 units/ml Monitor platelets by anticoagulation protocol: Yes   Plan:   Continue IV heparin infusion at 800 units/hr  Daily Heparin level/CBC  Haynes Hoehnolleen Veola Cafaro, PharmD, BCPS 12/06/2015, 8:18 AM  Pager: [phone]   ADDENDUM: Cardiology stopped IV heparin, consulted pharmacy to change to DVT px dosing.  Stop IV heparin and start SQ IV heparin 5000 units q8h  Haynes Hoehnolleen Blonnie Maske, PharmD, BCPS 12/06/2015, 9:39 AM  Pager: [phone]

## 2015-12-06 NOTE — Progress Notes (Signed)
Date:  December 06, 2015 Chart reviewed for concurrent status and case management needs. Will continue to follow the patient for status change: o2 at 40% and bipap last pm Discharge Planning: following for needs Expected discharge date: [id number] Marcelle SmilingRhonda Davis, BSN, [place]3, [place]   [phone]

## 2015-12-06 NOTE — Progress Notes (Signed)
   Name: Erin Morrison MRN: [id number] DOB: 09/26/1950    ADMISSION DATE:  12/03/2015  CHIEF COMPLAINT:  Respiratory distress  HISTORY OF PRESENT ILLNESS:  65 year old with moderate COPD From 11/3:  She was maintained on Symbicort and Spiriva earlier She was changed to Pulmicort with Perforomist -She does not like this regimen She developed URI symptoms followed by a chest cold-was given prednisone 60 mg dose pack and Levaquin by her PCP which she completed-failed outpt Rx & was admitted from office  Interval events:  Required BiPAP overnight - Afebrile Denies CP, breathing still labored  Significant tests/ events  PFT 07/13/15: FVC 2.09 L (73%) FEV1 1.45 L (66%) FEV1/FVC 0.69 FEF 25-75 0.92 L (47%) positive bronchodilator response TLC 5.56 liters (117%) RV 153% ERV 37% DLCO uncorrected 62%     TTE 11/4 >> normal LV size, mild LVH and diastolic dysfxn, EF 45-50%. Normal RV size and fxn, WMA +  VITAL SIGNS: Temp:  [97.3 F (36.3 C)-98.3 F (36.8 C)] 97.8 F (36.6 C) (11/06 0800) Pulse Rate:  [66-100] 98 (11/06 0820) Resp:  [14-22] 20 (11/06 0820) BP: (120-181)/(61-129) 153/81 (11/06 0820) SpO2:  [94 %-100 %] 98 % (11/06 0820) FiO2 (%):  [40 %] 40 % (11/06 0223) Weight:  [166 lb 10.7 oz (75.6 kg)-167 lb 15.9 oz (76.2 kg)] 167 lb 15.9 oz (76.2 kg) (11/06 0500)  PHYSICAL EXAMINATION:  Gen. Pleasant, well-nourished, in no distress off BiPAP ENT - no lesions, no post nasal drip Neck: No JVD, no thyromegaly, no carotid bruits Lungs: no use of accessory muscles, soft B exp wheeze Cardiovascular: Rhythm regular, heart sounds  normal, no murmurs or gallops, no peripheral edema Musculoskeletal: No deformities, no cyanosis or clubbing   Recent Labs Lab 12/03/15 1435 12/04/15 0511 12/05/15 0344  NA 139 136 138  K 4.1 4.4 4.2  CL 105 101 103  CO2 19* 25 26  BUN 11 14 21*  CREATININE 0.72  0.65 0.63 0.74  GLUCOSE 117* 149* 155*    Recent Labs Lab 12/04/15 0511  12/05/15 0344 12/06/15 0328  HGB 15.9* 15.3* 15.5*  HCT 48.5* 47.1* 47.2*  WBC 11.7* 23.4* 20.2*  PLT 319 283 281   No results found.  ASSESSMENT / PLAN:  Acute exacerbation of COPD- Solumedrol 60mg  q6. Hx of chronic pred 10mg  daily DuoNeb scheduled.  Restarted outpt daliresp, singulair, zyrtec 11/5  Probable RLL CAP azithro and ceftriaxone started 11/3 -plan x 5ds  Acute Resp failure due to the above BiPAP as needed, goal prn Low dose xanax prn anxiety  Stress-related NSTEMI, suspected underlying CAD Can dc Heparin gtt, ASA Appreciate Dr Ludwig Clarksrenshaw's assistance - may need inpt eval given WMA once COPD flare resolving  Hx HTN Not on home BP regimen  Hx GERD/ GI prophylaxis protonix ordered  DVT prophylaxis Currently on heparin gtt  Cyril Mourningakesh Latara Micheli MD. FCCP. [facility] Pager [phone] If no response call [phone]    12/06/2015, 9:00 AM

## 2015-12-07 DIAGNOSIS — E784 Other hyperlipidemia: Secondary | ICD-10-CM

## 2015-12-07 LAB — CBC
HCT: 43.7 % (ref 36.0–46.0)
HEMOGLOBIN: 14.7 g/dL (ref 12.0–15.0)
MCH: 30.9 pg (ref 26.0–34.0)
MCHC: 33.6 g/dL (ref 30.0–36.0)
MCV: 92 fL (ref 78.0–100.0)
PLATELETS: 301 10*3/uL (ref 150–400)
RBC: 4.75 MIL/uL (ref 3.87–5.11)
RDW: 13.2 % (ref 11.5–15.5)
WBC: 17.2 10*3/uL — ABNORMAL HIGH (ref 4.0–10.5)

## 2015-12-07 LAB — BASIC METABOLIC PANEL
Anion gap: 8 (ref 5–15)
BUN: 24 mg/dL — AB (ref 6–20)
CALCIUM: 9.2 mg/dL (ref 8.9–10.3)
CHLORIDE: 103 mmol/L (ref 101–111)
CO2: 28 mmol/L (ref 22–32)
CREATININE: 0.68 mg/dL (ref 0.44–1.00)
GFR calc non Af Amer: >60 mL/min (ref >60)
Glucose, Bld: 154 mg/dL — ABNORMAL HIGH (ref 65–99)
Potassium: 4.1 mmol/L (ref 3.5–5.1)
SODIUM: 139 mmol/L (ref 135–145)

## 2015-12-07 LAB — MRSA PCR SCREENING: MRSA BY PCR: NEGATIVE

## 2015-12-07 MED ORDER — ORAL CARE MOUTH RINSE
15.0000 mL | Freq: Two times a day (BID) | OROMUCOSAL | Status: DC
  Administered 2015-12-09 (×2): 15 mL via OROMUCOSAL

## 2015-12-07 MED ORDER — ISOSORBIDE MONONITRATE ER 30 MG PO TB24
30.0000 mg | ORAL_TABLET | Freq: Every day | ORAL | Status: DC
  Administered 2015-12-07 – 2015-12-10 (×4): 30 mg via ORAL
  Filled 2015-12-07 (×4): qty 1

## 2015-12-07 MED ORDER — BISACODYL 5 MG PO TBEC
10.0000 mg | DELAYED_RELEASE_TABLET | Freq: Once | ORAL | Status: AC
  Administered 2015-12-07: 10 mg via ORAL
  Filled 2015-12-07: qty 2

## 2015-12-07 NOTE — Progress Notes (Signed)
Patient Name: Leonie ManSandra Sherbert Date of Encounter: 12/07/2015  [facility]  H[facility] Problem List     Principal Problem:   COPD exacerbation [facility](HCC) Active Problems:   HTN (hypertension)   Community acquired pneumonia of right lower lobe of lung (HCC)   Acute respiratory failure with hypoxia (HCC)   NSTEMI (non-ST elevated myocardial infarction) (HCC)   Hyperlipidemia    Subjective   SOB about the same. Currently on a neb - states these help. Still diffusely wheezy.  Inpatient Medications    . aspirin EC  81 mg Oral Daily  . atorvastatin  40 mg Oral q1800  . azithromycin  500 mg Oral Daily  . cefTRIAXone (ROCEPHIN)  IV  1 g Intravenous Q24H  . heparin subcutaneous  5,000 Units Subcutaneous Q8H  . ipratropium-albuterol  3 mL Nebulization Q6H  . isosorbide mononitrate  15 mg Oral Daily  . loratadine  10 mg Oral Daily  . methylPREDNISolone (SOLU-MEDROL) injection  60 mg Intravenous Q6H  . montelukast  10 mg Oral QHS  . pantoprazole  40 mg Oral Daily  . roflumilast  500 mcg Oral Daily    Vital Signs    Vitals:   12/07/15 0400 12/07/15 0500 12/07/15 0600 12/07/15 0605  BP: (!) 141/83  133/68   Pulse: 99 81 77   Resp: 16 17 14    Temp:  97.6 F (36.4 C)    TempSrc:  Oral    SpO2: 94% 92% 91%   Weight:    165 lb 2 oz (74.9 kg)  Height:        Intake/Output Summary (Last 24 hours) at 12/07/15 0749 Last data filed at 12/06/15 2100  Gross per 24 hour  Intake               60 ml  Output              500 ml  Net             -440 ml   Filed Weights   12/05/15 1746 12/06/15 0500 12/07/15 0605  Weight: 166 lb 10.7 oz (75.6 kg) 167 lb 15.9 oz (76.2 kg) 165 lb 2 oz (74.9 kg)    Physical Exam    General: Well developed chronically ill appearing WF in no acute distress HEENT: Normocephalic, atraumatic, sclera non-icteric, no xanthomas, nares are without discharge. Neck: Negative for carotid bruits. JVP not elevated. Lungs: Diffusely diminished with  diffuse quiet expiratory wheezing. Breathing is unlabored. Cardiac: RRR S1 S2 without murmurs, rubs, or gallops.  Abdomen: Soft, non-tender, non-distended with normoactive bowel sounds. No rebound/guarding. Extremities: No clubbing or cyanosis. No edema. Distal pedal pulses are 2+ and equal bilaterally. Skin: Warm and dry, no significant rash. Neuro: Alert and oriented X 3. Sensation in tact. Follows commands. Psych:  Responds to questions appropriately with a normal affect.  Labs    CBC  Recent Labs  12/06/15 0328 12/07/15 0307  WBC 20.2* 17.2*  HGB 15.5* 14.7  HCT 47.2* 43.7  MCV 94.6 92.0  PLT 281 301   Basic Metabolic Panel  Recent Labs  12/05/15 0344 12/07/15 0307  NA 138 139  K 4.2 4.1  CL 103 103  CO2 26 28  GLUCOSE 155* 154*  BUN 21* 24*  CREATININE 0.74 0.68  CALCIUM 9.2 9.2   Cardiac Enzymes  Recent Labs  12/04/15 1039  TROPONINI 0.92*    Telemetry    NSR occ PVCs  Radiology    Dg Chest 2 View  Result Date: 12/03/2015 CLINICAL DATA:  COPD exacerbation. Worsening shortness of breath for 2 weeks with left-sided chest pain and cough. EXAM: CHEST  2 VIEW COMPARISON:  02/25/2011 FINDINGS: The cardiac silhouette is borderline enlarged, accentuated by AP technique. Aortic atherosclerosis is noted. There is new mild elevation of the right hemidiaphragm increased AP diameter of the chest is again seen consistent with hyperinflation and underlying COPD. Airway thickening and interstitial coarsening have increased from the prior study. There is minimal scarring or atelectasis in the left lung base. No lobar consolidation, edema, pleural effusion, or pneumothorax is identified. No acute osseous abnormality is seen. IMPRESSION: Increased bronchitic changes. Electronically Signed   By: Sebastian AcheAllen  Grady M.D.   On: 12/03/2015 15:15   Dg Chest Port 1 View  Result Date: 12/03/2015 CLINICAL DATA:  Sudden onset of increased shortness of breath and dyspnea, history  hypertension, COPD, former smoker EXAM: PORTABLE CHEST 1 VIEW COMPARISON:  Portable exam 1704 hours compared to 12/03/2015 at 1455 hours FINDINGS: Normal heart size, mediastinal contours, and pulmonary vascularity. Bronchitic changes with slight increase in RIGHT perihilar markings since the earlier exam question developing pneumonia. Linear subsegmental atelectasis LEFT base. Remaining lungs clear. No pleural effusion or pneumothorax. IMPRESSION: Bronchitic changes with questionable RIGHT basilar infiltrate. Electronically Signed   By: Ulyses SouthwardMark  Boles M.D.   On: 12/03/2015 18:04     Patient Profile     45F with COPD, former tobacco, previous DVT, HTN admitted with acute exacerbation of COPD, probable RLL CAP & acute respiratory failure requiring BiPAP, found to have elevated troponin up to 1.73. 2D Echo 12/04/15: mild LVH, EF 45-50%, akinesis of mid anteroseptal myocardium and mid inferolateral/inferior myocardium, grade 1 DD.  Assessment & Plan    1. NSTEMI - continue ASA, statin. Heparin stopped 11/6 by PCCM (>48 hours tx), reviewed with Dr. Katrinka BlazingSmith - OK given no active chest pain. Not currently on BB given her AECOPD. Will need LHC when medically improved. Breathing status still tenuous; remains quite wheezy today. May be looking at later this week.  2. AECOPD/CAP - per PCCM. Leukocytosis may be 2/2 steroids.  3. HTN - may be exacerbated by steroid therapy. No BB given COPD. Low dose nitrate added yesterday. Will titrate to 30mg  daily given intermittent spikes in BP.  4. Hyperlipidemia - LDL 134. If found to have CAD may consider escalating statin further to 80mg  daily with f/u labs as OP.  Signed, Laurann Montanaayna N Dunn, PA-C  12/07/2015, 7:49 AM  The patient has been seen in conjunction with Ronie Spiesayna Dunn, PA-C. All aspects of care have been considered and discussed. The patient has been personally interviewed, examined, and all clinical data has been reviewed.   Cardiac status is stable. Vague complaint  of chest tightness but she feels this is related to her underlying pulmonary process. It is different than the pressure she feels in her chest when physically active.  Continue medical therapy for ACS including aspirin, atorvastatin, isosorbide mononitrate (I will uptitrate to 30 mg), and when necessary sublingual nitroglycerin.  The patient remains stable, invasive cardiac evaluation potentially as an outpatient. We will see how things go.

## 2015-12-07 NOTE — Progress Notes (Signed)
Patient sats staying 88-92%. Patient reports severe SOB with movement to bedside commode and long recovery periods. Patient c/o severe SOB and requested bipap. Bipap resumed for now.

## 2015-12-08 DIAGNOSIS — I10 Essential (primary) hypertension: Secondary | ICD-10-CM

## 2015-12-08 LAB — CBC
HCT: 46.5 % — ABNORMAL HIGH (ref 36.0–46.0)
HEMOGLOBIN: 15.1 g/dL — AB (ref 12.0–15.0)
MCH: 30.3 pg (ref 26.0–34.0)
MCHC: 32.5 g/dL (ref 30.0–36.0)
MCV: 93.4 fL (ref 78.0–100.0)
PLATELETS: 287 10*3/uL (ref 150–400)
RBC: 4.98 MIL/uL (ref 3.87–5.11)
RDW: 13.3 % (ref 11.5–15.5)
WBC: 13.5 10*3/uL — ABNORMAL HIGH (ref 4.0–10.5)

## 2015-12-08 LAB — BASIC METABOLIC PANEL
Anion gap: 9 (ref 5–15)
BUN: 23 mg/dL — AB (ref 6–20)
CHLORIDE: 102 mmol/L (ref 101–111)
CO2: 28 mmol/L (ref 22–32)
CREATININE: 0.65 mg/dL (ref 0.44–1.00)
Calcium: 9.4 mg/dL (ref 8.9–10.3)
Glucose, Bld: 147 mg/dL — ABNORMAL HIGH (ref 65–99)
POTASSIUM: 4 mmol/L (ref 3.5–5.1)
SODIUM: 139 mmol/L (ref 135–145)

## 2015-12-08 MED ORDER — IPRATROPIUM-ALBUTEROL 0.5-2.5 (3) MG/3ML IN SOLN
3.0000 mL | RESPIRATORY_TRACT | Status: DC
  Administered 2015-12-08 – 2015-12-09 (×7): 3 mL via RESPIRATORY_TRACT
  Filled 2015-12-08 (×6): qty 3

## 2015-12-08 MED ORDER — METHYLPREDNISOLONE SODIUM SUCC 125 MG IJ SOLR
60.0000 mg | Freq: Three times a day (TID) | INTRAMUSCULAR | Status: DC
  Administered 2015-12-08 – 2015-12-09 (×3): 60 mg via INTRAVENOUS
  Filled 2015-12-08 (×3): qty 2

## 2015-12-08 MED ORDER — ENOXAPARIN SODIUM 30 MG/0.3ML ~~LOC~~ SOLN
30.0000 mg | SUBCUTANEOUS | Status: DC
  Administered 2015-12-08 – 2015-12-09 (×2): 30 mg via SUBCUTANEOUS
  Filled 2015-12-08 (×2): qty 0.3

## 2015-12-08 NOTE — Progress Notes (Signed)
   Name: Erin Morrison MRN: [id number] DOB: 08/26/1950    ADMISSION DATE:  12/03/2015  CHIEF COMPLAINT:  Respiratory distress  HISTORY OF PRESENT ILLNESS:  65 year old with moderate COPD admitted for COPD exacerbation failed outpt Rx & was admitted from office   She was maintained on Symbicort and Spiriva earlier She was changed to Pulmicort with Perforomist -She does not like this regimen   Interval events:  Afebrile Denies CP, breathing still labored  Significant tests/ events  PFT 07/13/15: FVC 2.09 L (73%) FEV1 1.45 L (66%) FEV1/FVC 0.69 FEF 25-75 0.92 L (47%) positive bronchodilator response TLC 5.56 liters (117%) RV 153% ERV 37% DLCO uncorrected 62%     TTE 11/4 >> normal LV size, mild LVH and diastolic dysfxn, EF 45-50%. Normal RV size and fxn, WMA +  VITAL SIGNS: Temp:  [98 F (36.7 C)-98.6 F (37 C)] 98.6 F (37 C) (11/08 0348) Pulse Rate:  [71-104] 99 (11/08 1000) Resp:  [14-20] 19 (11/08 1000) BP: (120-183)/(63-95) 134/68 (11/08 1000) SpO2:  [86 %-98 %] 94 % (11/08 1000) FiO2 (%):  [40 %] 40 % (11/08 0125)  PHYSICAL EXAMINATION:  Gen. Pleasant, well-nourished, in mild distress  ENT - no lesions, no post nasal drip Neck: No JVD, no thyromegaly, no carotid bruits Lungs: no use of accessory muscles, soft B exp wheeze Cardiovascular: Rhythm regular, heart sounds  normal, no murmurs or gallops, no peripheral edema Musculoskeletal: No deformities, no cyanosis or clubbing   Recent Labs Lab 12/05/15 0344 12/07/15 0307 12/08/15 0330  NA 138 139 139  K 4.2 4.1 4.0  CL 103 103 102  CO2 26 28 28   BUN 21* 24* 23*  CREATININE 0.74 0.68 0.65  GLUCOSE 155* 154* 147*    Recent Labs Lab 12/06/15 0328 12/07/15 0307 12/08/15 0330  HGB 15.5* 14.7 15.1*  HCT 47.2* 43.7 46.5*  WBC 20.2* 17.2* 13.5*  PLT 281 301 287   No results found.  ASSESSMENT / PLAN:  Acute exacerbation of COPD- Solumedrol 60mg  q6. Hx of chronic pred 10mg  daily DuoNeb scheduled.    Restarted outpt daliresp, singulair, zyrtec 11/5  Probable RLL CAP azithro and ceftriaxone started 11/3 -plan x 5ds  Acute Resp failure due to the above BiPAP as needed, goal prn Low dose xanax prn anxiety  Stress-related NSTEMI, suspected underlying CAD ct ASA, Imdur, no BB - may need inpt eval given WMA once COPD flare resolves  Hx HTN Not on home BP regimen  Hx GERD/ GI prophylaxis protonix ordered  DVT prophylaxis was on heparin gtt  Cyril Mourningakesh Tiras Bianchini MD. FCCP. [facility] Pager [phone] If no response call [phone]    12/08/2015, 10:14 AM

## 2015-12-08 NOTE — Progress Notes (Signed)
Pt resting comfortably on 7 LPM [place] Malone.  Pt in no noted distress at this time.  RT will hold BIPAP for now, RT to monitor and assess as needed.

## 2015-12-08 NOTE — Progress Notes (Signed)
Patient tx frequency increased to Q4 per multiple request for PRN breathing tx.

## 2015-12-08 NOTE — Progress Notes (Signed)
Patient Name: Erin Morrison Date of Encounter: 12/08/2015  Primary Cardiologist: Dr. [facility]  Hospital Problem List     Principal Problem:   COPD exacerbation [facility](HCC) Active Problems:   HTN (hypertension)   Community acquired pneumonia of right lower lobe of lung (HCC)   Acute respiratory failure with hypoxia (HCC)   NSTEMI (non-ST elevated myocardial infarction) (HCC)   Hyperlipidemia    Subjective   Breathing still not at baseline. Having episodes of mild chest tightness, improved with breathing treatments.   Inpatient Medications    Scheduled Meds: . aspirin EC  81 mg Oral Daily  . atorvastatin  40 mg Oral q1800  . azithromycin  500 mg Oral Daily  . cefTRIAXone (ROCEPHIN)  IV  1 g Intravenous Q24H  . heparin subcutaneous  5,000 Units Subcutaneous Q8H  . ipratropium-albuterol  3 mL Nebulization Q6H  . isosorbide mononitrate  30 mg Oral Daily  . loratadine  10 mg Oral Daily  . mouth rinse  15 mL Mouth Rinse BID  . methylPREDNISolone (SOLU-MEDROL) injection  60 mg Intravenous Q6H  . montelukast  10 mg Oral QHS  . pantoprazole  40 mg Oral Daily  . roflumilast  500 mcg Oral Daily   Continuous Infusions:  PRN Meds: acetaminophen, ALPRAZolam, levalbuterol   Vital Signs    Vitals:   12/07/15 2041 12/08/15 0000 12/08/15 0125 12/08/15 0348  BP:   (!) 168/90   Pulse:   82   Resp:   14   Temp:  98.2 F (36.8 C)  98.6 F (37 C)  TempSrc:  Axillary  Axillary  SpO2: 94%  92% 94%  Weight:      Height:        Intake/Output Summary (Last 24 hours) at 12/08/15 0736 Last data filed at 12/07/15 2100  Gross per 24 hour  Intake              910 ml  Output              900 ml  Net               10 ml   Filed Weights   12/05/15 1746 12/06/15 0500 12/07/15 0605  Weight: 166 lb 10.7 oz (75.6 kg) 167 lb 15.9 oz (76.2 kg) 165 lb 2 oz (74.9 kg)    Physical Exam   GEN: Well nourished, Caucasian female appearing in no acute distress.  HEENT: Grossly normal.  Neck:  Supple, no JVD, carotid bruits, or masses. Cardiac: RRR, no murmurs, rubs, or gallops. No clubbing, cyanosis, edema.  Radials/DP/PT 2+ and equal bilaterally.  Respiratory:  Respirations regular and unlabored, diffuse expiratory wheezing in upper lung fields GI: Soft, nontender, nondistended, BS + x 4. MS: no deformity or atrophy. Skin: warm and dry, no rash. Neuro:  Strength and sensation are intact. Psych: AAOx3.  Normal affect.  Labs    CBC  Recent Labs  12/07/15 0307 12/08/15 0330  WBC 17.2* 13.5*  HGB 14.7 15.1*  HCT 43.7 46.5*  MCV 92.0 93.4  PLT 301 287   Basic Metabolic Panel  Recent Labs  12/07/15 0307 12/08/15 0330  NA 139 139  K 4.1 4.0  CL 103 102  CO2 28 28  GLUCOSE 154* 147*  BUN 24* 23*  CREATININE 0.68 0.65  CALCIUM 9.2 9.4    Telemetry    NSR, HR in 70's to 80's. Occasional multiform PVC's. - Personally Reviewed  ECG    No new tracings.   Radiology  No results found.  Cardiac Studies   Echocardiogram: 12/04/2015 Study Conclusions  - Left ventricle: The cavity size was normal. Wall thickness was   increased in a pattern of mild LVH. Systolic function was mildly   reduced. The estimated ejection fraction was in the range of 45%   to 50%. There is akinesis of the midanteroseptal myocardium.   There is akinesis of the midinferolateral and inferior   myocardium. Doppler parameters are consistent with abnormal left   ventricular relaxation (grade 1 diastolic dysfunction).  Impressions:  - Akinesis of the basal inferior/inferolateral and mid septal walls   with overall mildly reduced LV systolic function; grade 1   diastolic dysfunction.  Patient Profile     89F w/ PMH of COPD, former tobacco, previous DVT, HTN admitted with acute exacerbation of COPD, probable RLL CAP & acute respiratory failure requiring BiPAP, found to have elevated troponin up to 1.73.   Assessment & Plan    1. NSTEMI -  Cyclic troponin values peaked at  1.73 in the setting of her acute respiratory failure. EKG without acute ischemic changes. Treated with IV Heparin for > 48 hours. Continue ASA, statin, and Imdur. No BB in the setting of acute COPD exacerbation. - will need a cardiac catheterization for definitive evaluation once respiratory status improves.   2. Acute COPD Exacerbation/CAP  - per PCCM. Leukocytosis may be 2/2 steroids (improving with WBC count down to 13.5 this AM).  3. HTN - BP has been 120/63 - 168/90 in the past 24 hours. May be exacerbated by steroid therapy. No BB given COPD. Consider addition of Amlodipine 5mg  daily if BP remains elevated.   4. Hyperlipidemia  - LDL 134 this admission, currently on Lipitor 40mg  daily. If found to have CAD may consider escalating statin further to 80mg  daily with f/u labs as an OP.  Signed, Ellsworth LennoxBrittany M Strader, PA  12/08/2015, 7:36 AM   The patient has been seen in conjunction with Randall AnBrittany Strader, PA. All aspects of care have been considered and discussed. The patient has been personally interviewed, examined, and all clinical data has been reviewed.   If she remains stable from CV standpoint, will probably opt for OP cath after she has had a good period of stability.

## 2015-12-08 NOTE — Progress Notes (Signed)
   Name: Erin Morrison MRN: [id number] DOB: 05/21/1950    ADMISSION DATE:  12/03/2015  CHIEF COMPLAINT:  Respiratory distress  HISTORY OF PRESENT ILLNESS:  65 year old with moderate COPD admitted for COPD exacerbation failed outpt Rx & was admitted from office   She was maintained on Symbicort and Spiriva earlier She was changed to Pulmicort with Perforomist -She does not like this regimen   Interval events:  Anxious & feels 'confused' Used bipap x 3 h overnight Afebrile Denies CP, breathing still labored  Significant tests/ events  PFT 07/13/15: FVC 2.09 L (73%) FEV1 1.45 L (66%) FEV1/FVC 0.69 FEF 25-75 0.92 L (47%) positive bronchodilator response TLC 5.56 liters (117%) RV 153% ERV 37% DLCO uncorrected 62%     TTE 11/4 >> normal LV size, mild LVH and diastolic dysfxn, EF 45-50%. Normal RV size and fxn, WMA +  VITAL SIGNS: Temp:  [98 F (36.7 C)-98.6 F (37 C)] 98.6 F (37 C) (11/08 0348) Pulse Rate:  [71-104] 99 (11/08 1000) Resp:  [14-20] 19 (11/08 1000) BP: (120-183)/(63-95) 134/68 (11/08 1000) SpO2:  [86 %-98 %] 94 % (11/08 1000) FiO2 (%):  [40 %] 40 % (11/08 0125)  PHYSICAL EXAMINATION:  Gen. Pleasant, well-nourished, in mild distress  ENT - no lesions, no post nasal drip Neck: No JVD, no thyromegaly, no carotid bruits Lungs: no use of accessory muscles, soft B exp wheeze Cardiovascular: Rhythm regular, heart sounds  normal, no murmurs or gallops, no peripheral edema Musculoskeletal: No deformities, no cyanosis or clubbing   Recent Labs Lab 12/05/15 0344 12/07/15 0307 12/08/15 0330  NA 138 139 139  K 4.2 4.1 4.0  CL 103 103 102  CO2 26 28 28   BUN 21* 24* 23*  CREATININE 0.74 0.68 0.65  GLUCOSE 155* 154* 147*    Recent Labs Lab 12/06/15 0328 12/07/15 0307 12/08/15 0330  HGB 15.5* 14.7 15.1*  HCT 47.2* 43.7 46.5*  WBC 20.2* 17.2* 13.5*  PLT 281 301 287   No results found.  ASSESSMENT / PLAN:  Acute exacerbation of COPD- Drop  Solumedrol 60mg  q8. Hx of chronic pred 10mg  daily DuoNeb scheduled.  Ct outpt daliresp, singulair, zyrtec 11/5  Probable RLL CAP azithro and ceftriaxone started 11/3 -plan x 5ds until 11/8  Acute Resp failure due to the above BiPAP as needed, goal prn Low dose xanax prn anxiety  Stress-related NSTEMI, suspected underlying CAD ct ASA, Imdur, no BB - may need cath given WMA once COPD flare resolves - inpt vs outpt  Hx HTN Not on home BP regimen  Hx GERD/ GI prophylaxis protonix ordered  DVT prophylaxis lovenox SQ  Cyril Mourningakesh Alva MD. FCCP. [facility] Pager [phone] If no response call [phone]    12/08/2015, 10:18 AM

## 2015-12-09 MED ORDER — FUROSEMIDE 20 MG PO TABS
20.0000 mg | ORAL_TABLET | Freq: Once | ORAL | Status: AC
  Administered 2015-12-09: 20 mg via ORAL
  Filled 2015-12-09: qty 1

## 2015-12-09 MED ORDER — METHYLPREDNISOLONE SODIUM SUCC 125 MG IJ SOLR
60.0000 mg | Freq: Two times a day (BID) | INTRAMUSCULAR | Status: DC
Start: 2015-12-09 — End: 2015-12-10
  Administered 2015-12-09 – 2015-12-10 (×2): 60 mg via INTRAVENOUS
  Filled 2015-12-09 (×2): qty 2

## 2015-12-09 MED ORDER — IPRATROPIUM-ALBUTEROL 0.5-2.5 (3) MG/3ML IN SOLN
3.0000 mL | Freq: Four times a day (QID) | RESPIRATORY_TRACT | Status: DC
  Administered 2015-12-10 (×3): 3 mL via RESPIRATORY_TRACT
  Filled 2015-12-09 (×2): qty 3

## 2015-12-09 NOTE — Progress Notes (Signed)
Patient Name: Erin Morrison Date of Encounter: 12/09/2015  Primary Cardiologist: Patient wishes to follow-up with Dr. Alicia Morrison   Hospital Problem List     Principal Problem:   COPD exacerbation [facility](HCC) Active Problems:   Essential hypertension   Community acquired pneumonia of right lower lobe of lung (HCC)   Acute respiratory failure with hypoxia (HCC)   NSTEMI (non-ST elevated myocardial infarction) (HCC)   Hyperlipidemia    Subjective   Breathing improved, did not require BiPAP overnight. Reports having a constant mild chest pressure, still improved with breathing treatments.   Inpatient Medications    Scheduled Meds: . aspirin EC  81 mg Oral Daily  . atorvastatin  40 mg Oral q1800  . enoxaparin (LOVENOX) injection  30 mg Subcutaneous Q24H  . furosemide  20 mg Oral Once  . ipratropium-albuterol  3 mL Nebulization Q4H  . isosorbide mononitrate  30 mg Oral Daily  . loratadine  10 mg Oral Daily  . mouth rinse  15 mL Mouth Rinse BID  . methylPREDNISolone (SOLU-MEDROL) injection  60 mg Intravenous Q12H  . montelukast  10 mg Oral QHS  . pantoprazole  40 mg Oral Daily  . roflumilast  500 mcg Oral Daily   Continuous Infusions:  PRN Meds: acetaminophen, ALPRAZolam, levalbuterol   Vital Signs    Vitals:   12/09/15 0730 12/09/15 0800 12/09/15 0822 12/09/15 1000  BP:  (!) 140/91  (!) 151/79  Pulse: 77 71  90  Resp: 13 14  17   Temp:  97.4 F (36.3 C)    TempSrc:  Oral    SpO2: 93% 93% 95% 93%  Weight:      Height:        Intake/Output Summary (Last 24 hours) at 12/09/15 1040 Last data filed at 12/09/15 0600  Gross per 24 hour  Intake              200 ml  Output                0 ml  Net              200 ml   Filed Weights   12/06/15 0500 12/07/15 0605 12/09/15 0500  Weight: 167 lb 15.9 oz (76.2 kg) 165 lb 2 oz (74.9 kg) 168 lb 6.9 oz (76.4 kg)    Physical Exam   GEN: Well nourished, Caucasian female appearing in no acute distress.  HEENT: Grossly normal.    Neck: Supple, no JVD, carotid bruits, or masses. Cardiac: RRR, no murmurs, rubs, or gallops. No clubbing, cyanosis, edema.  Radials/DP/PT 2+ and equal bilaterally.  Respiratory:  Respirations regular and unlabored, diffuse expiratory wheezing in upper lung fields bilaterally. GI: Soft, nontender, nondistended, BS + x 4. MS: no deformity or atrophy. Skin: warm and dry, no rash. Neuro:  Strength and sensation are intact. Psych: AAOx3.  Normal affect.  Labs    CBC  Recent Labs  12/07/15 0307 12/08/15 0330  WBC 17.2* 13.5*  HGB 14.7 15.1*  HCT 43.7 46.5*  MCV 92.0 93.4  PLT 301 287   Basic Metabolic Panel  Recent Labs  12/07/15 0307 12/08/15 0330  NA 139 139  K 4.1 4.0  CL 103 102  CO2 28 28  GLUCOSE 154* 147*  BUN 24* 23*  CREATININE 0.68 0.65  CALCIUM 9.2 9.4    Telemetry    NSR, HR in 70's to 80's. 3 beats NSVT. - Personally Reviewed  ECG    No new tracings.  Radiology    No results found.  Cardiac Studies   Echocardiogram: 12/04/2015 Study Conclusions  - Left ventricle: The cavity size was normal. Wall thickness was   increased in a pattern of mild LVH. Systolic function was mildly   reduced. The estimated ejection fraction was in the range of 45%   to 50%. There is akinesis of the midanteroseptal myocardium.   There is akinesis of the midinferolateral and inferior   myocardium. Doppler parameters are consistent with abnormal left   ventricular relaxation (grade 1 diastolic dysfunction).  Impressions:  - Akinesis of the basal inferior/inferolateral and mid septal walls   with overall mildly reduced LV systolic function; grade 1   diastolic dysfunction.  Patient Profile     [age]F w/ PMH of COPD, former tobacco, previous DVT, HTN admitted with acute exacerbation of COPD, probable RLL CAP & acute respiratory failure requiring BiPAP, found to have elevated troponin up to 1.73.   Assessment & Plan    1. NSTEMI -  Cyclic troponin values peaked  at 1.73 in the setting of her acute respiratory failure. EKG without acute ischemic changes. Treated with IV Heparin for > 48 hours. Echo shows EF of 45-50% with akinesis of the mid-anteroseptal myocardium. - Continue ASA, statin, and Imdur. No BB in the setting of acute COPD exacerbation. - plans are for a definitive cardiac catheterization as an outpatient once respiratory status improves.   2. Acute COPD Exacerbation/CAP  - per PCCM. Leukocytosis may be 2/2 steroids (improving with WBC count down to 13.5 this AM).  3. HTN - BP has been 132/73 - 161/96 in the past 24 hours. May be exacerbated by steroid therapy. No BB given COPD. Consider addition of Amlodipine 5mg  daily if BP remains elevated.   4. Hyperlipidemia  - LDL 134 this admission, currently on Lipitor 40mg  daily. If found to have CAD may consider escalating statin further to 80mg  daily with f/u labs as an OP.  Have arranged for an outpatient follow-up appointment with Dr. Katrinka BlazingSmith on 12/22/2015. Can be rescheduled if needed pending clinical course.  Signed, Erin LennoxBrittany M Strader, PA  12/09/2015, 10:40 AM  The patient has been seen in conjunction with [place] Strader, PA-C. All aspects of care have been considered and discussed. The patient has been personally interviewed, examined, and all clinical data has been reviewed.   Improving. Denies angina.   Given the severity of the COPD exacerbation, it would be advisable to have outpatient diagnostic catheterization performed in several weeks after she fully recovers. We will see her in office in make arrangements for the procedure after discharge.`

## 2015-12-09 NOTE — Progress Notes (Signed)
   Name: Erin Morrison MRN: [id number] DOB: 08/28/1950    ADMISSION DATE:  12/03/2015  CHIEF COMPLAINT:  Respiratory distress  HISTORY OF PRESENT ILLNESS:  65 year old with moderate COPD admitted for COPD exacerbation failed outpt Rx & was admitted from office   She was maintained on Symbicort and Spiriva earlier She was changed to Pulmicort with Perforomist -She does not like this regimen   Interval events:  Less Anxious Afebrile Did not need bipap overnight Denies CP, breathing improving  Significant tests/ events  PFT 07/13/15: FVC 2.09 L (73%) FEV1 1.45 L (66%) FEV1/FVC 0.69 FEF 25-75 0.92 L (47%) positive bronchodilator response TLC 5.56 liters (117%) RV 153% ERV 37% DLCO uncorrected 62%     TTE 11/4 >> normal LV size, mild LVH and diastolic dysfxn, EF 45-50%. Normal RV size and fxn, WMA +  VITAL SIGNS: Temp:  [97.3 F (36.3 C)-98.4 F (36.9 C)] 97.4 F (36.3 C) (11/09 0800) Pulse Rate:  [71-92] 71 (11/09 0800) Resp:  [13-17] 14 (11/09 0800) BP: (132-161)/(73-96) 140/91 (11/09 0800) SpO2:  [92 %-96 %] 95 % (11/09 0822) Weight:  [168 lb 6.9 oz (76.4 kg)] 168 lb 6.9 oz (76.4 kg) (11/09 0500)  PHYSICAL EXAMINATION:  Gen. Pleasant, well-nourished, in mild distress  ENT - no lesions, no post nasal drip Neck: No JVD, no thyromegaly, no carotid bruits Lungs: no use of accessory muscles, soft B exp rhonchi Cardiovascular: Rhythm regular, heart sounds  normal, no murmurs or gallops, no peripheral edema Musculoskeletal: No deformities, no cyanosis or clubbing   Recent Labs Lab 12/05/15 0344 12/07/15 0307 12/08/15 0330  NA 138 139 139  K 4.2 4.1 4.0  CL 103 103 102  CO2 26 28 28   BUN 21* 24* 23*  CREATININE 0.74 0.68 0.65  GLUCOSE 155* 154* 147*    Recent Labs Lab 12/06/15 0328 12/07/15 0307 12/08/15 0330  HGB 15.5* 14.7 15.1*  HCT 47.2* 43.7 46.5*  WBC 20.2* 17.2* 13.5*  PLT 281 301 287   No results found.  ASSESSMENT / PLAN:  Acute exacerbation  of COPD- Drop Solumedrol 60mg  q12. Hx of chronic pred 10mg  daily DuoNeb scheduled.  Ct outpt daliresp, singulair, zyrtec 11/5  Probable RLL CAP azithro and ceftriaxone started 11/3 -can dc  Acute Resp failure due to the above BiPAP as needed, goal prn Low dose xanax prn anxiety  Stress-related NSTEMI, suspected underlying CAD ct ASA, Imdur, no BB - will need cath given WMA once COPD flare resolves - perhaps outpt  Hx HTN Not on home BP regimen  Hx GERD/ GI prophylaxis protonix ordered  DVT prophylaxis lovenox SQ  Improving, can transfer to floor since does not need bipap, but still onhigh flow O2 Needs PT  Cyril Mourningakesh Jaiel Saraceno MD. FCCP. [facility] Pager [phone] If no response call [phone]    12/09/2015, 10:12 AM

## 2015-12-09 NOTE — Evaluation (Addendum)
Physical Therapy Evaluation Patient Details Name: Erin Morrison MRN: [id number] DOB: 07/09/1950 Today's Date: 12/09/2015   History of Present Illness  Dois DavenportSandra Calais has a past medical history of Acute DVT); Allergic rhinitis; Atrophic vaginitis; COPD ; Hypertension; Low back pain; and Umbilical hernia, admitted  12/03/15 with acute respiratory distress requiring BiPAP, Stress related NSTEMI .  Clinical Impression  The patient was pleased to ambulate today out into the hall, 45' on 8 liters which is the amount Respiratory therapist recommended for acvtivity.   The oxygen saturation  Remained >92%.  Pt admitted with above diagnosis. Pt currently with functional limitations due to the deficits listed below (see PT Problem List).  Pt will benefit from skilled PT to increase their independence and safety with mobility to allow discharge to the venue listed below.  .  Would patient be a [facility] the future?    Follow Up Recommendations Home health PT (could [facility]  [facility]    Equipment Recommendations       Recommendations for Other Services   Pulmonary Rehab if MD deems patient meets criteria    Precautions / Restrictions Precautions Precaution Comments: monitor VS, on HFNC 6 L.at rest Restrictions Weight Bearing Restrictions: No      Mobility  Bed Mobility Overal bed mobility: Modified Independent                Transfers Overall transfer level: Needs assistance Equipment used: Rolling walker (2 wheeled) Transfers: Sit to/from Stand Sit to Stand: Min guard         General transfer comment: use of rail,  Ambulation/Gait Ambulation/Gait assistance: Min assist Ambulation Distance (Feet): 45 Feet Assistive device: Rolling walker (2 wheeled) Gait Pattern/deviations: Step-through pattern Gait velocity: decreased   General Gait Details: monitored oxygen, sats >92% on 8 liters. HR 107  Encouraged   Pursed lip breaths.  Stairs            Wheelchair Mobility    Modified Rankin (Stroke Patients Only)       Balance Overall balance assessment: Needs assistance         Standing balance support: During functional activity;Bilateral upper extremity supported Standing balance-Leahy Scale: Fair                               Pertinent Vitals/Pain Pain Assessment: 0-10 Pain Score: 2  Pain Location: all over= stiff Pain Descriptors / Indicators: Discomfort    Home Living Family/patient expects to be discharged to:: Private residence Living Arrangements: Spouse/significant other Available Help at Discharge: Family Type of Home: House Home Access: Stairs to enter Entrance Stairs-Rails: None Secretary/administratorntrance Stairs-Number of Steps: 2 Home Layout: One level Home Equipment: None      Prior Function                 Hand Dominance        Extremity/Trunk Assessment   Upper Extremity Assessment: Generalized weakness           Lower Extremity Assessment: Generalized weakness      Cervical / Trunk Assessment: Kyphotic  Communication      Cognition Arousal/Alertness: Awake/alert Behavior During Therapy: WFL for tasks assessed/performed Overall Cognitive Status: Within Functional Limits for tasks assessed                      General Comments      Exercises  Assessment/Plan    PT Assessment Patient needs continued PT services  PT Problem List Decreased strength;Decreased activity tolerance;Cardiopulmonary status limiting activity;Decreased mobility          PT Treatment Interventions DME instruction;Gait training;Functional mobility training;Therapeutic activities;Patient/family education    PT Goals (Current goals can be found in the Care Plan section)  Acute Rehab PT Goals Patient Stated Goal: to breathe and walk PT Goal Formulation: With patient Time For Goal Achievement: 12/23/15 Potential to Achieve Goals: Good     Frequency Min 3X/week   Barriers to discharge        Co-evaluation               End of Session Equipment Utilized During Treatment: Oxygen Activity Tolerance: Patient tolerated treatment well Patient left: in bed;with call bell/phone within reach;with bed alarm set Nurse Communication: Mobility status         Time: [id number] PT Time Calculation (min) (ACUTE ONLY): 30 min   Charges:   PT Evaluation $PT Eval Low Complexity: 1 Procedure PT Treatments $Gait Training: 8-22 mins   PT G Codes:        Rada HayHill, Chaylee Ehrsam Elizabeth 12/09/2015, 4:13 PM Blanchard KelchKaren Romario Tith PT [phone]

## 2015-12-09 NOTE — Progress Notes (Signed)
Date:  December 09, 2015 Chart reviewed for concurrent status and case management needs. Will continue to follow the patient for status change: hfnc Discharge Planning: following for needs Expected discharge date: [id number] Marcelle SmilingRhonda Davis, BSN, IrvonaRN3, [place]   [phone]

## 2015-12-10 MED ORDER — ISOSORBIDE MONONITRATE ER 30 MG PO TB24: 30.0000 mg | ORAL_TABLET | Freq: Every day | ORAL | 3 refills | Status: DC

## 2015-12-10 MED ORDER — PREDNISONE 20 MG PO TABS: ORAL_TABLET | ORAL | 0 refills | Status: DC

## 2015-12-10 MED ORDER — ATORVASTATIN CALCIUM 40 MG PO TABS: 40.0000 mg | ORAL_TABLET | Freq: Every day | ORAL | 3 refills | Status: DC

## 2015-12-10 NOTE — Progress Notes (Signed)
She is much improved and was able to ambulate with PT She can be discharged home today on 60 mg of prednisone-with slow taper over 2 weeks She will continue on Pulmicort and Perforomist and Spiriva-with albuterol as needed She will stay on aspirin and Imdur until seen by cardiology -an appointment has been made and they will schedule outpatient cardiac  She will need set up with home oxygen Erin Morrison,Erin Morrison V. MD

## 2015-12-10 NOTE — Care Management Note (Signed)
Case Management Note  Patient Details  Name: Erin Morrison MRN: [id number] Date of Birth: 01/10/1951  Subjective/Objective: Qualifies for home 02-[facility] dme rep Jermaine aware of d/c, order, & to deliver to rm prior d/c. HHPT ordered. Provided patient w/HHC agency list-awaiting choice since they have a relative that does PT in a SNF they are deciding.                   Action/Plan:d/c home w/HHC/DME   Expected Discharge Date:   (unknown)               Expected Discharge Plan:  Home w Home Health Services  In-House Referral:     Discharge planning Services  CM Consult  Post Acute Care Choice:    Choice offered to:  Patient  DME Arranged:  Oxygen DME Agency:  [facility]  HH [facility]  [facility]     Status of Service:  In process, will continue to follow  If discussed at Long Length of Stay Meetings, dates discussed:    Additional Comments:  Lanier ClamMahabir, Braylynn Ghan, RN 12/10/2015, 12:08 PM

## 2015-12-10 NOTE — Progress Notes (Signed)
SATURATION QUALIFICATIONS: (This note is used to comply with regulatory documentation for home oxygen)  Patient Saturations on Room Air at Rest = 85%  Patient Saturations on Room Air while Ambulating = 83%  Patient Saturations on 2 Liters HFNC of oxygen while Ambulating = 94%  Please briefly explain why patient needs home oxygen: Pt drops significantly without oxygen

## 2015-12-10 NOTE — Care Management Note (Signed)
Case Management Note  Patient Details  Name: Leonie ManSandra Vergara MRN: [id number] Date of Birth: 10/04/1950  Subjective/Objective:  Patient has declined HHPT that was ordered since sh has a relative who is a PT @ [facility] who will gladly do her PT.  [facility]                  Action/Plan:d/c home w/home 02   Expected Discharge Date:   (unknown)               Expected Discharge Plan:  Home w Home Health Services  In-House Referral:     Discharge planning Services  CM Consult  Post Acute Care Choice:    Choice offered to:  Patient  DME Arranged:  Oxygen DME Agency:  [facility]  HH Arranged:  PT, [facility] Agency:     Status of Service:  Completed, signed off  If discussed at Long Length of Stay Meetings, dates discussed:    Additional Comments:  Lanier ClamMahabir, Kennadee Walthour, RN 12/10/2015, 12:18 PM

## 2015-12-10 NOTE — Discharge Summary (Signed)
Physician Discharge Summary  Patient ID: Erin Morrison MRN: [id number] DOB/AGE: [birth year] 65 y.o.  Admit date: 12/03/2015 Discharge date: 12/10/2015    Discharge Diagnoses:  Acute Exacerbation of COPD  RLL CAP  Acute Hypoxic Respiratory Failure (secondary to above) NSTEMI  HTN Hyperlipidemia  GERD                                                                      DISCHARGE PLAN BY DIAGNOSIS     Acute Exacerbation of COPD  RLL CAP  Acute Hypoxic Respiratory Failure (secondary to above)  Discharge Plan: Prednisone taper starting at 60 mg to off over 2 weeks Continue Budesonide, formoterol, daliresp, spiriva + PRN albuterol  Continue cetirizine, singulair O2 at 4L continuously  Follow up with Pulmonary as below   NSTEMI  HTN Hyperlipidemia   Discharge Plan: Continue ASA, Lipitor & Imdur Given 3 month Rx at discharge Will plan for outpatient cardiac cath to evaluate for CAD  Follow up with Cardiology as below  GERD  Discharge Plan: Continue nexium                   DISCHARGE SUMMARY   Erin Morrison is a 65 y.o. y/o female, former smoker, with a PMH of DVTmoderate COPD (with recent exacerbation & failed outpatient therapy who presented to the Pulmonary office on 11/3 with complaints of SOB.  Directly admitted to [facility].    During office visit, the patient reported she was having difficulty walking short distances.  She attempted to use her rescue inhaler but it did not provide relief.  She recently felt as though she had a URI and was treated by her PCP with levaquin and prednisone taper. The patient was was admitted from the office for further evaluation.  She developed worsening shortness of breath / respiratory distress overnight of 11/3 requiring bipap therapy but she could not tolerate it.  She was very anxious and medicated with xanax.  EKG, enzymes were cycled with distress.  Troponin was found to be elevated (rose to 1.73).  EKG showed non-specific T wave  changes, ST.  ECHO ordered which showed akinesis of the basal inferior / inferolateral & mid-septal walls with overall mildly reduced LV systolic function, grade 1 diastolic dysfunction.  Cardiology was consulted for evaluation.  She was found to have an NSTEMI thought related to respiratory distress / demand.  She was started on a heparin gtt.  Status improved with BiPAP use (required xanax).  She was treated for a RLL CAP with azithromycin and rocephin.  The patient was also treated for AECOPD with IV steroids and nebulized bronchodilators.  Cardiology ultimately decided to follow up with her as an outpatient, then plan for cath to evaluate for CAD.  She made slow improvement and was medically cleared for discharge 11/10 with plans as above.    SIGNIFICANT DIAGNOSTIC STUDIES PFT 07/13/15 >> FVC 2.09 L (73%) FEV1 1.45 L (66%) FEV1/FVC 0.69 FEF 25-75 0.92 L (47%) positive bronchodilator response TLC 5.56 liters (117%) RV 153% ERV 37% DLCO uncorrected 62%  ECHO 12/04/15 >> akinesis of the basal inferior / inferolateral & mid-septal walls with overall mildly reduced LV systolic function, grade 1 diastolic dysfunction  ANTIBIOTICS Azithro 11/3 >> 11/9  Rocephin 11/3 >>  11/9  CONSULTS Cardiology   Discharge Exam: General: well developed female in NAD  Neuro: AAOx4, speech clear, MAE CV: s1s2 rrr, no m/r/g, no peripheral edema  PULM: even/non-labored on 4L O2, lungs bilaterally with faint wheeze GI: soft, non-tender, BSx4 active  Extremities: warm/dry, no edema   Vitals:   12/09/15 2203 12/10/15 0607 12/10/15 0618 12/10/15 0740  BP: 116/63  (!) 142/76   Pulse: 85  73   Resp: 18  20   Temp: 98.5 F (36.9 C)     TempSrc: Oral     SpO2: 94%  95% 96%  Weight:  166 lb 1.6 oz (75.3 kg)    Height:         Discharge Labs  BMET  Recent Labs Lab 12/03/15 1435 12/04/15 0511 12/05/15 0344 12/07/15 0307 12/08/15 0330  NA 139 136 138 139 139  K 4.1 4.4 4.2 4.1 4.0  CL 105 101 103 103  102  CO2 19* _0 GLUCOSE 117* 149* 155* 154* 147*  BUN 11 14 21* 24* 23*  CREATININE 0.72  0.65 0.63 0.74 0.68 0.65  CALCIUM 9.6 9.6 9.2 9.2 9.4  MG 2.0 2.1  --   --   --   PHOS 4.0 4.3  --   --   --     CBC  Recent Labs Lab 12/06/15 0328 12/07/15 0307 12/08/15 0330  HGB 15.5* 14.7 15.1*  HCT 47.2* 43.7 46.5*  WBC 20.2* 17.2* 13.5*  PLT 281 301 287    Anti-Coagulation  Recent Labs Lab 12/03/15 2308  INR 1.05    Discharge Instructions    Call MD for:  difficulty breathing, headache or visual disturbances    Complete by:  As directed    Call MD for:  extreme fatigue    Complete by:  As directed    Call MD for:  hives    Complete by:  As directed    Call MD for:  persistant dizziness or light-headedness    Complete by:  As directed    Call MD for:  persistant nausea and vomiting    Complete by:  As directed    Call MD for:  severe uncontrolled pain    Complete by:  As directed    Call MD for:  temperature >100.4    Complete by:  As directed    Diet - low sodium heart healthy    Complete by:  As directed    Discharge instructions    Complete by:  As directed    1.  Review your medications carefully as they have changed 2.  Follow up with Cardiology as scheduled 3.  Follow up with Pulmonary as scheduled  4.  Complete your prednisone taper as prescribed 5.  Wear 4 Liters of oxygen continuously  6.  Call offices if new or worsening symptoms.  Report to the ER immediately if new concerns.   Increase activity slowly    Complete by:  As directed        Follow-up Information    Sinclair Grooms, MD Follow up on 12/22/2015.   Specialty:  Cardiology Why:  [facility] Hospital Follow-up on 12/22/2015 at 4:00PM.  Contact information: [address] [address] [phone]        Rexene Edison, NP Follow up on 12/20/2015.   Specialty:  Pulmonary Disease Why:  Appt at 2:00 PM  Contact information: [address] [place] [address] [phone]  Medication List    TAKE these medications   AEROCHAMBER MV inhaler Use as instructed   albuterol 108 (90 Base) MCG/ACT inhaler Commonly known as:  PROVENTIL HFA;VENTOLIN HFA Inhale into the lungs every 6 (six) hours as needed for wheezing or shortness of breath.   albuterol (2.5 MG/3ML) 0.083% nebulizer solution Commonly known as:  PROVENTIL Take 2.5 mg by nebulization every 6 (six) hours as needed for wheezing or shortness of breath.   aspirin 81 MG tablet Take 81 mg by mouth daily.   atorvastatin 40 MG tablet Commonly known as:  LIPITOR Take 1 tablet (40 mg total) by mouth daily at 6 PM.   budesonide 0.5 MG/2ML nebulizer solution Commonly known as:  PULMICORT Take 2 mLs (0.5 mg total) by nebulization 2 (two) times daily. DX: J44.9   cetirizine 10 MG tablet Commonly known as:  ZYRTEC Take 10 mg by mouth daily.   esomeprazole 40 MG capsule Commonly known as:  NEXIUM Take 40 mg by mouth daily as needed (for acid reflex).   formoterol 20 MCG/2ML nebulizer solution Commonly known as:  PERFOROMIST Take 2 mLs (20 mcg total) by nebulization 2 (two) times daily. DX: J44.9   isosorbide mononitrate 30 MG 24 hr tablet Commonly known as:  IMDUR Take 1 tablet (30 mg total) by mouth daily. Start taking on:  12/11/2015   montelukast 10 MG tablet Commonly known as:  SINGULAIR TAKE 1 TABLET (10 MG TOTAL) BY MOUTH AT BEDTIME.   predniSONE 20 MG tablet Commonly known as:  DELTASONE 3 tabs for 4 days, then 2 tabs for 4 days, then 1 tab for 4 days, then 1/2 tab for 4 days What changed:  medication strength  how much to take  how to take this  when to take this  additional instructions   roflumilast 500 MCG Tabs tablet Commonly known as:  DALIRESP Take 1 tablet (500 mcg total) by mouth daily.   Tiotropium Bromide Monohydrate 2.5 MCG/ACT Aers Commonly known as:  SPIRIVA RESPIMAT Inhale 2 puffs into the lungs  daily.            Durable Medical Equipment        Start     Ordered   12/10/15 1055  For home use only DME oxygen  Once    Question Answer Comment  Mode or (Route) Nasal cannula   Liters per Minute 4   Frequency Continuous (stationary and portable oxygen unit needed)   Oxygen delivery system Gas      12/10/15 1054        Disposition:  Home.  Home O2 ordered as well as home health PT.    Discharged Condition: Erin Morrison has met maximum benefit of inpatient care and is medically stable and cleared for discharge.  Patient is pending follow up as above.      Time spent on disposition:  Greater than 35 minutes.   Signed: Noe Gens, NP-C [facility] Pgr: [phone] Office: [phone]

## 2015-12-10 NOTE — Progress Notes (Signed)
Completed D/C teaching with patient. Answered questions. Pt oxygen was brought in by [facility]. Patient will be D/C home in stable condition with family.

## 2015-12-20 ENCOUNTER — Encounter: Payer: Self-pay | Admitting: Adult Health

## 2015-12-20 ENCOUNTER — Ambulatory Visit (INDEPENDENT_AMBULATORY_CARE_PROVIDER_SITE_OTHER): Payer: Managed Care, Other (non HMO) | Admitting: Adult Health

## 2015-12-20 DIAGNOSIS — J449 Chronic obstructive pulmonary disease, unspecified: Secondary | ICD-10-CM | POA: Diagnosis not present

## 2015-12-20 DIAGNOSIS — J9601 Acute respiratory failure with hypoxia: Secondary | ICD-10-CM | POA: Diagnosis not present

## 2015-12-20 MED ORDER — ROFLUMILAST 500 MCG PO TABS: 500.0000 ug | ORAL_TABLET | Freq: Every day | ORAL | 4 refills | Status: DC

## 2015-12-20 MED ORDER — PREDNISONE 10 MG PO TABS: 10.0000 mg | ORAL_TABLET | Freq: Every day | ORAL | 1 refills | Status: DC

## 2015-12-20 MED ORDER — TIOTROPIUM BROMIDE MONOHYDRATE 2.5 MCG/ACT IN AERS: 2.0000 | INHALATION_SPRAY | Freq: Every day | RESPIRATORY_TRACT | 5 refills | Status: DC

## 2015-12-20 NOTE — Progress Notes (Signed)
Note reviewed but assessment & plan not available.  Donna ChristenJennings E. Jamison NeighborNestor, M.D. [facility] Pager:  [phone] After 3pm or if no response, call [phone] 5:12 PM 12/20/15

## 2015-12-20 NOTE — Assessment & Plan Note (Addendum)
Recent flare now resolving  Says she has been on predniosne 10mg  daily for months -will taper to 10mg  and hold , address on return ov and try to slowly taper to off if able over several weeks.   Plan  Patient Instructions  Continue on Budesonide/Perforomist Neb Twice daily  .  Continue on Spiriva 2 puffs daily  Use oxygen 2l/m at rest , 4l/m walking  Restart Daliresp daily .  Finish Prednisone taper as directed.  Follow up Dr. Jamison NeighborNestor in 1 month and As needed   Please contact office for sooner follow up if symptoms do not improve or worsen or seek emergency care

## 2015-12-20 NOTE — Assessment & Plan Note (Signed)
Improved on O2   Plan  Patient Instructions  Continue on Budesonide/Perforomist Neb Twice daily  .  Continue on Spiriva 2 puffs daily  Use oxygen 2l/m at rest , 4l/m walking  Restart Daliresp daily .  Finish Prednisone taper as directed.  Follow up Dr. Jamison NeighborNestor in 1 month and As needed   Please contact office for sooner follow up if symptoms do not improve or worsen or seek emergency care

## 2015-12-20 NOTE — Progress Notes (Signed)
Subjective:    Patient ID: [place], female    DOB: 08/01/1950, 65 y.o.   MRN: [id number]  HPI 65 year old female with moderate COPD , former smoker  TEST  PFT 07/13/15: FVC 2.09 L (73%) FEV1 1.45 L (66%) FEV1/FVC 0.69 FEF 25-75 0.92 L (47%) positive bronchodilator response TLC 5.56 liters (117%) RV 153% ERV 37% DLCO uncorrected 62%  6MWT 07/21/15: Walked 372 meters / Baseline Sat 94% on RA / Nadir Sat 94% on RA  IMAGING CXR PA/LAT 02/22/15 (per radiologist): Bibasilar scarring or atelectasis. Heart normal in size. No effusion or bony abnormality.  CXR PA/LAT 02/25/11 (per radiologist): Lungs are clear with cardiac silhouette & visualized bony skeleton unremarkable. No evidence of acute cardiopulmonary disease.   CARDIAC EKG 06/08/15 (previously reviewed by me): Normal sinus rhythm. QTC 406 ms. Upright T waves & no ST segment depression to suggest ischemia.  LABS 06/08/15 Tropoinin I: 0.00 IgG: 648 IgA: 248 IgM: 126 IgE: 15 RAST Panel: Negative Alpha-1 antitrypsin: MM (169) CBC: 11.9/16.0/47.4/348   12/20/2015  Post Hospital follow up  Patient presents for a post hospital follow up . She was admitted earlier this month for COPD exacerbation . She was treated with abx, steroids and nebs. Hospital stay compliacated by NSTEMI. She did require initial BIPAP support. She has follow up with cardiology this week.  She remains on Spiriva. On Budesonide and Perforomist nebs Twice daily  .  Started on Daliresp at discharge, needs rx sent to pharmacy .  Discharged on Oxygen 4l/m . O2 sats today on 2l/m at rest 96%.  She denies chest pain, orthopnea, edema or fever.    Past Medical History:  Diagnosis Date  . Acute DVT (deep venous thrombosis) (HCC)   . Allergic rhinitis   . Atrophic vaginitis   . COPD (chronic obstructive pulmonary disease) (HCC)   . Hypertension   . Low back pain   . Umbilical hernia    Current Outpatient Prescriptions on File Prior to Visit  Medication  Sig Dispense Refill  . albuterol (PROVENTIL HFA;VENTOLIN HFA) 108 (90 Base) MCG/ACT inhaler Inhale into the lungs every 6 (six) hours as needed for wheezing or shortness of breath.    Marland Kitchen. albuterol (PROVENTIL) (2.5 MG/3ML) 0.083% nebulizer solution Take 2.5 mg by nebulization every 6 (six) hours as needed for wheezing or shortness of breath.    Marland Kitchen. aspirin 81 MG tablet Take 81 mg by mouth daily.    Marland Kitchen. atorvastatin (LIPITOR) 40 MG tablet Take 1 tablet (40 mg total) by mouth daily at 6 PM. 30 tablet 3  . budesonide (PULMICORT) 0.5 MG/2ML nebulizer solution Take 2 mLs (0.5 mg total) by nebulization 2 (two) times daily. DX: J44.9 120 mL 3  . cetirizine (ZYRTEC) 10 MG tablet Take 10 mg by mouth daily.    Marland Kitchen. esomeprazole (NEXIUM) 40 MG capsule Take 40 mg by mouth daily as needed (for acid reflex).     . formoterol (PERFOROMIST) 20 MCG/2ML nebulizer solution Take 2 mLs (20 mcg total) by nebulization 2 (two) times daily. DX: J44.9 120 mL 3  . isosorbide mononitrate (IMDUR) 30 MG 24 hr tablet Take 1 tablet (30 mg total) by mouth daily. 30 tablet 3  . montelukast (SINGULAIR) 10 MG tablet TAKE 1 TABLET (10 MG TOTAL) BY MOUTH AT BEDTIME. 30 tablet 3  . predniSONE (DELTASONE) 20 MG tablet 3 tabs for 4 days, then 2 tabs for 4 days, then 1 tab for 4 days, then 1/2 tab for 4 days 26 tablet  0  . roflumilast (DALIRESP) 500 MCG TABS tablet Take 1 tablet (500 mcg total) by mouth daily. 14 tablet 0  . Spacer/Aero-Holding Chambers (AEROCHAMBER MV) inhaler Use as instructed 1 each 0  . Tiotropium Bromide Monohydrate (SPIRIVA RESPIMAT) 2.5 MCG/ACT AERS Inhale 2 puffs into the lungs daily. 1 Inhaler 5   No current facility-administered medications on file prior to visit.      Review of Systems Constitutional:   No  weight loss, night sweats,  Fevers, chills, + fatigue, or  lassitude.  HEENT:   No headaches,  Difficulty swallowing,  Tooth/dental problems, or  Sore throat,                No sneezing, itching, ear ache,    +nasal congestion, post nasal drip,   CV:  No chest pain,  Orthopnea, PND, swelling in lower extremities, anasarca, dizziness, palpitations, syncope.   GI  No heartburn, indigestion, abdominal pain, nausea, vomiting, diarrhea, change in bowel habits, loss of appetite, bloody stools.   Resp:    No chest wall deformity  Skin: no rash or lesions.  GU: no dysuria, change in color of urine, no urgency or frequency.  No flank pain, no hematuria   MS:  No joint pain or swelling.  No decreased range of motion.  No back pain.  Psych:  No change in mood or affect. No depression or anxiety.  No memory loss.         Objective:   Physical Exam Vitals:   12/20/15 1413  BP: 120/70  Pulse: 73  Temp: 98.1 F (36.7 C)  TempSrc: Oral  SpO2: 97%  Weight: 169 lb (76.7 kg)  Height: 5\' 2"  (1.575 m)    GEN: A/Ox3; pleasant , NAD, elderly     HEENT:  [place]/AT,  EACs-clear, TMs-wnl, NOSE-clear, THROAT-clear, no lesions, no postnasal drip or exudate noted. Poor dentition.   NECK:  Supple w/ fair ROM; no JVD; normal carotid impulses w/o bruits; no thyromegaly or nodules palpated; no lymphadenopathy.    RESP  Decreased BS in bases  no accessory muscle use, no dullness to percussion  CARD:  RRR, no m/r/g  , no peripheral edema, pulses intact, no cyanosis or clubbing.  GI:   Soft & nt; nml bowel sounds; no organomegaly or masses detected.   Musco: Warm bil, no deformities or joint swelling noted.   Neuro: alert, no focal deficits noted.    Skin: Warm, no lesions or rashes  Ahria Slappey NP-C  [facility]  12/20/2015

## 2015-12-20 NOTE — Addendum Note (Signed)
Addended by: Abigail MiyamotoPHELPS, DENISE D on: 12/20/2015 02:58 PM   Modules accepted: Orders

## 2015-12-20 NOTE — Patient Instructions (Addendum)
Continue on Budesonide/Perforomist Neb Twice daily  .  Continue on Spiriva 2 puffs daily  Use oxygen 2l/m at rest , 4l/m walking  Restart Daliresp daily .  Finish Prednisone taper as directed. -10mg  daily hold at this dose.  Follow up Dr. Jamison NeighborNestor in 1 month and As needed   Please contact office for sooner follow up if symptoms do not improve or worsen or seek emergency care

## 2015-12-22 ENCOUNTER — Ambulatory Visit (INDEPENDENT_AMBULATORY_CARE_PROVIDER_SITE_OTHER): Payer: Managed Care, Other (non HMO) | Admitting: Interventional Cardiology

## 2015-12-22 ENCOUNTER — Encounter: Payer: Self-pay | Admitting: Interventional Cardiology

## 2015-12-22 VITALS — BP 120/64 | HR 72 | Ht 62.0 in | Wt 168.8 lb

## 2015-12-22 DIAGNOSIS — I10 Essential (primary) hypertension: Secondary | ICD-10-CM | POA: Diagnosis not present

## 2015-12-22 DIAGNOSIS — I214 Non-ST elevation (NSTEMI) myocardial infarction: Secondary | ICD-10-CM

## 2015-12-22 DIAGNOSIS — J449 Chronic obstructive pulmonary disease, unspecified: Secondary | ICD-10-CM | POA: Diagnosis not present

## 2015-12-22 DIAGNOSIS — E784 Other hyperlipidemia: Secondary | ICD-10-CM | POA: Diagnosis not present

## 2015-12-22 DIAGNOSIS — E7849 Other hyperlipidemia: Secondary | ICD-10-CM

## 2015-12-22 NOTE — Progress Notes (Signed)
Cardiology Office Note    Date:  12/22/2015   ID:  [place], DOB 10/11/1950, MRN [id number]  PCP:  Lonie PeakNathan Conroy, PA-C  Cardiologist: Lesleigh NoeHenry W Sharen Youngren III, MD   Chief Complaint  Patient presents with  . Coronary Artery Disease    NSTEMI  . COPD    History of Present Illness:  E[place] is a 65 y.o. female follow-up after recent hospitalization for COPD decompensation with acute respiratory failure, acute DVT, elevated cardiac markers and wall motion abnormality on echo compatible with non-ST elevation inferolateral myocardial infarction.  The patient is doing much better than when hospitalized [place] earlier this month. She is still on oxygen. Her physical activity is still limited. She is accompanied by her granddaughter. She was referred back to our office because the plan is to do further investigation on the heart to determine the severity of underlying coronary disease. She will need coronary angiography. Since discharge she has had no symptoms consistent with angina or recurrent heart attack. She denies palpitations. She has not had syncope. She feels she is getting better day by day. No prior history of heart disease. No prior history of MI.  Past Medical History:  Diagnosis Date  . Acute DVT (deep venous thrombosis) (HCC)   . Allergic rhinitis   . Atrophic vaginitis   . COPD (chronic obstructive Morrison disease) (HCC)   . Hypertension   . Low back pain   . Umbilical hernia     Past Surgical History:  Procedure Laterality Date  . arthroscopic knee surgery Right   . TUBAL LIGATION      Current Medications: Outpatient Medications Prior to Visit  Medication Sig Dispense Refill  . albuterol (PROVENTIL HFA;VENTOLIN HFA) 108 (90 Base) MCG/ACT inhaler Inhale into the lungs every 6 (six) hours as needed for wheezing or shortness of breath.    Marland Kitchen. albuterol (PROVENTIL) (2.5 MG/3ML) 0.083% nebulizer solution Take 2.5 mg by nebulization every 6 (six) hours as  needed for wheezing or shortness of breath.    Marland Kitchen. aspirin 81 MG tablet Take 81 mg by mouth daily.    Marland Kitchen. atorvastatin (LIPITOR) 40 MG tablet Take 1 tablet (40 mg total) by mouth daily at 6 PM. 30 tablet 3  . budesonide (PULMICORT) 0.5 MG/2ML nebulizer solution Take 2 mLs (0.5 mg total) by nebulization 2 (two) times daily. DX: J44.9 120 mL 3  . cetirizine (ZYRTEC) 10 MG tablet Take 10 mg by mouth daily.    Marland Kitchen. esomeprazole (NEXIUM) 40 MG capsule Take 40 mg by mouth daily as needed (for acid reflex).     . formoterol (PERFOROMIST) 20 MCG/2ML nebulizer solution Take 2 mLs (20 mcg total) by nebulization 2 (two) times daily. DX: J44.9 120 mL 3  . isosorbide mononitrate (IMDUR) 30 MG 24 hr tablet Take 1 tablet (30 mg total) by mouth daily. 30 tablet 3  . montelukast (SINGULAIR) 10 MG tablet TAKE 1 TABLET (10 MG TOTAL) BY MOUTH AT BEDTIME. 30 tablet 3  . predniSONE (DELTASONE) 10 MG tablet Take 1 tablet (10 mg total) by mouth daily with breakfast. 30 tablet 1  . predniSONE (DELTASONE) 20 MG tablet 3 tabs for 4 days, then 2 tabs for 4 days, then 1 tab for 4 days, then 1/2 tab for 4 days 26 tablet 0  . roflumilast (DALIRESP) 500 MCG TABS tablet Take 1 tablet (500 mcg total) by mouth daily. 30 tablet 4  . Spacer/Aero-Holding Chambers (AEROCHAMBER MV) inhaler Use as instructed 1 each 0  .  Tiotropium Bromide Monohydrate (SPIRIVA RESPIMAT) 2.5 MCG/ACT AERS Inhale 2 puffs into the lungs daily. (Patient not taking: Reported on 12/22/2015) 1 Inhaler 5   No facility-administered medications prior to visit.      Allergies:   Patient has no known allergies.   Social History   Social History  . Marital status: [place]    Spouse name: N/A  . Number of children: N/A  . Years of education: N/A   Occupational History  . Retired    Social History Main Topics  . Smoking status: Former Smoker    Packs/day: 1.50    Years: 46.00    Types: Cigarettes    Start date: 01/31/1964    Quit date: 01/30/2010  . Smokeless  tobacco: Never Used  . Alcohol use No  . Drug use: No  . Sexual activity: No   Other Topics Concern  . None   Social History Narrative   Adult nurseLeBauer [place]:   Originally from [place]. Moved to [place]. Has traveled to [place]. 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 Has an outside dog. No bird or mold exposure. She does have a musty smell in her bedroom.      Family History:  The patient's family history includes Cirrhosis in her father; Emphysema in her mother.   ROS:   Please see the history of present illness.    Vision disturbance, cough, chest pressure that improves with breathing treatments.  All other systems reviewed and are negative.   PHYSICAL EXAM:   VS:  BP 120/64   Pulse 72   Ht 5\' 2"  (1.575 m)   Wt 168 lb 12.8 oz (76.6 kg)   BMI 30.87 kg/m    GEN: Well nourished, well developed, in no acute distress  HEENT: normal  Neck: no JVD, carotid bruits, or masses Cardiac: RRR; no murmurs, rubs, or gallops,no edema  Respiratory:  clear to auscultation bilaterally, normal work of breathing GI: soft, nontender, nondistended, + BS MS: no deformity or atrophy  Skin: warm and dry, no rash Neuro:  Alert and Oriented x 3, Strength and sensation are intact Psych: euthymic mood, full affect  Wt Readings from Last 3 Encounters:  12/22/15 168 lb 12.8 oz (76.6 kg)  12/20/15 169 lb (76.7 kg)  12/10/15 166 lb 1.6 oz (75.3 kg)      Studies/Labs Reviewed:   EKG:  EKG  Performed 12/04/2015 reveals poor R-wave progression, normal sinus rhythm, and no acute change. A new tracing is not performed today.  Recent Labs: 12/03/2015: ALT 21 12/04/2015: Magnesium 2.1; TSH 0.750 12/08/2015: BUN 23; Creatinine, Ser 0.65; Hemoglobin 15.1; Platelets 287; Potassium 4.0; Sodium 139   Lipid Panel    Component Value Date/Time   CHOL 207 (H) 12/04/2015 0511   TRIG 79 12/04/2015 0511   HDL 57 12/04/2015 0511   CHOLHDL 3.6 12/04/2015 0511   VLDL 16  12/04/2015 0511   LDLCALC 134 (H) 12/04/2015 0511    Additional studies/ records that were reviewed today include:  Echocardiogram 12/04/15:  Study Conclusions  - Left ventricle: The cavity size was normal. Wall thickness was   increased in a pattern of mild LVH. Systolic function was mildly   reduced. The estimated ejection fraction was in the range of 45%   to 50%. There is akinesis of the midanteroseptal myocardium.   There is akinesis of the midinferolateral and inferior   myocardium. Doppler parameters are consistent with abnormal left   ventricular  relaxation (grade 1 diastolic dysfunction).  Impressions:  - Akinesis of the basal inferior/inferolateral and mid septal walls   with overall mildly reduced LV systolic function; grade 1   diastolic dysfunction.    ASSESSMENT:    1. NSTEMI (non-ST elevated myocardial infarction) (HCC)   2. Essential hypertension   3. Moderate COPD (chronic obstructive Morrison disease) (HCC)   4. Other hyperlipidemia      PLAN:  In order of problems listed above:  1. No recurrence of symptoms or clinical abnormalities to suggest coronary disease/ischemia. This occurred in the setting of severe hypoxia and respiratory distress. EKG never showed evolving changes. Echo did demonstrate an inferolateral wall motion abnormality and cardiac markers were elevated. She is markedly improved but I do not feel she is ready for elective heart cath. Plan to see her back in 4-6 weeks. She should not start Morrison rehabilitation until coronary angiography is performed. 2. Well controlled. 3. On O2 therapy. She is needing to wear it continuously. She is followed by Dr. Celene Skeen, [place] medicine/CCM. 4. Not addressed.    Medication Adjustments/Labs and Tests Ordered: Current medicines are reviewed at length with the patient today.  Concerns regarding medicines are outlined above.  Medication changes, Labs and Tests ordered today are listed in the  Patient Instructions below. Patient Instructions  Medication Instructions:  None  Labwork: None  Testing/Procedures: None  Follow-Up: Your physician recommends that you schedule a follow-up appointment in: 4-6 weeks with Dr. Katrinka Blazing.    Any Other Special Instructions Will Be Listed Below (If Applicable).     If you need a refill on your cardiac medications before your next appointment, please call your pharmacy.      Signed, Lesleigh Noe, MD  12/22/2015 4:26 PM    [facility] [address] [place], [place], [place]  [address] Phone: [phone]; Fax: [phone]

## 2015-12-22 NOTE — Patient Instructions (Addendum)
Medication Instructions:  None  Labwork: None  Testing/Procedures: None  Follow-Up: Your physician recommends that you schedule a follow-up appointment in: 4-6 weeks with Dr. Katrinka BlazingSmith. (Can take end slot on 02/07/16 or 02/10/16)   Any Other Special Instructions Will Be Listed Below (If Applicable).   Low-Sodium Eating Plan Sodium raises blood pressure and causes water to be held in the body. Getting less sodium from food will help lower your blood pressure, reduce any swelling, and protect your heart, liver, and kidneys. We get sodium by adding salt (sodium chloride) to food. Most of our sodium comes from canned, boxed, and frozen foods. Restaurant foods, fast foods, and pizza are also very high in sodium. Even if you take medicine to lower your blood pressure or to reduce fluid in your body, getting less sodium from your food is important. What is my plan? Most people should limit their sodium intake to 2,300 mg a day. Your health care provider recommends that you limit your sodium intake to __________ a day. What do I need to know about this eating plan? For the low-sodium eating plan, you will follow these general guidelines:  Choose foods with a % Daily Value for sodium of less than 5% (as listed on the food label).  Use salt-free seasonings or herbs instead of table salt or sea salt.  Check with your health care provider or pharmacist before using salt substitutes.  Eat fresh foods.  Eat more vegetables and fruits.  Limit canned vegetables. If you do use them, rinse them well to decrease the sodium.  Limit cheese to 1 oz (28 g) per day.  Eat lower-sodium products, often labeled as "lower sodium" or "no salt added."  Avoid foods that contain monosodium glutamate (MSG). MSG is sometimes added to [place] food and some canned foods.  Check food labels (Nutrition Facts labels) on foods to learn how much sodium is in one serving.  Eat more home-cooked food and less restaurant,  buffet, and fast food.  When eating at a restaurant, ask that your food be prepared with less salt, or no salt if possible. How do I read food labels for sodium information? The Nutrition Facts label lists the amount of sodium in one serving of the food. If you eat more than one serving, you must multiply the listed amount of sodium by the number of servings. Food labels may also identify foods as:  Sodium free-Less than 5 mg in a serving.  Very low sodium-35 mg or less in a serving.  Low sodium-140 mg or less in a serving.  Light in sodium-50% less sodium in a serving. For example, if a food that usually has 300 mg of sodium is changed to become light in sodium, it will have 150 mg of sodium.  Reduced sodium-25% less sodium in a serving. For example, if a food that usually has 400 mg of sodium is changed to reduced sodium, it will have 300 mg of sodium. What foods can I eat? Grains  Low-sodium cereals, including oats, puffed wheat and rice, and shredded wheat cereals. Low-sodium crackers. Unsalted rice and pasta. Lower-sodium bread. Vegetables  Frozen or fresh vegetables. Low-sodium or reduced-sodium canned vegetables. Low-sodium or reduced-sodium tomato sauce and paste. Low-sodium or reduced-sodium tomato and vegetable juices. Fruits  Fresh, frozen, and canned fruit. Fruit juice. Meat and Other Protein Products  Low-sodium canned tuna and salmon. Fresh or frozen meat, poultry, seafood, and fish. Lamb. Unsalted nuts. Dried beans, peas, and lentils without added salt. Unsalted canned  beans. Homemade soups without salt. Eggs. Dairy  Milk. Soy milk. Ricotta cheese. Low-sodium or reduced-sodium cheeses. Yogurt. Condiments  Fresh and dried herbs and spices. Salt-free seasonings. Onion and garlic powders. Low-sodium varieties of mustard and ketchup. Fresh or refrigerated horseradish. Lemon juice. Fats and Oils  Reduced-sodium salad dressings. Unsalted butter. Other  Unsalted popcorn and  pretzels. The items listed above may not be a complete list of recommended foods or beverages. Contact your dietitian for more options.  What foods are not recommended? Grains  Instant hot cereals. Bread stuffing, pancake, and biscuit mixes. Croutons. Seasoned rice or pasta mixes. Noodle soup cups. Boxed or frozen macaroni and cheese. Self-rising flour. Regular salted crackers. Vegetables  Regular canned vegetables. Regular canned tomato sauce and paste. Regular tomato and vegetable juices. Frozen vegetables in sauces. Salted JamaicaFrench fries. Olives. Rosita FirePickles. Relishes. Sauerkraut. Salsa. Meat and Other Protein Products  Salted, canned, smoked, spiced, or pickled meats, seafood, or fish. Bacon, ham, sausage, hot dogs, corned beef, chipped beef, and packaged luncheon meats. Salt pork. Jerky. Pickled herring. Anchovies, regular canned tuna, and sardines. Salted nuts. Dairy  Processed cheese and cheese spreads. Cheese curds. Blue cheese and cottage cheese. Buttermilk. Condiments  Onion and garlic salt, seasoned salt, table salt, and sea salt. Canned and packaged gravies. Worcestershire sauce. Tartar sauce. Barbecue sauce. Teriyaki sauce. Soy sauce, including reduced sodium. Steak sauce. Fish sauce. Oyster sauce. Cocktail sauce. Horseradish that you find on the shelf. Regular ketchup and mustard. Meat flavorings and tenderizers. Bouillon cubes. Hot sauce. Tabasco sauce. Marinades. Taco seasonings. Relishes. Fats and Oils  Regular salad dressings. Salted butter. Margarine. Ghee. Bacon fat. Other  Potato and tortilla chips. Corn chips and puffs. Salted popcorn and pretzels. Canned or dried soups. Pizza. Frozen entrees and pot pies. The items listed above may not be a complete list of foods and beverages to avoid. Contact your dietitian for more information.  This information is not intended to replace advice given to you by your health care provider. Make sure you discuss any questions you have with your  health care provider. Document Released: 07/08/2001 Document Revised: 06/24/2015 Document Reviewed: 11/20/2012 Elsevier Interactive Patient Education  2017 [employer]    If you need a refill on your cardiac medications before your next appointment, please call your pharmacy.

## 2016-01-13 ENCOUNTER — Ambulatory Visit (INDEPENDENT_AMBULATORY_CARE_PROVIDER_SITE_OTHER)
Admission: RE | Admit: 2016-01-13 | Discharge: 2016-01-13 | Disposition: A | Discharge status: Home / Self Care | Payer: Managed Care, Other (non HMO) | Source: Ambulatory Visit | Attending: Pulmonary Disease | Admitting: Pulmonary Disease

## 2016-01-13 ENCOUNTER — Encounter: Payer: Self-pay | Admitting: Pulmonary Disease

## 2016-01-13 ENCOUNTER — Ambulatory Visit (INDEPENDENT_AMBULATORY_CARE_PROVIDER_SITE_OTHER): Payer: Managed Care, Other (non HMO) | Admitting: Pulmonary Disease

## 2016-01-13 VITALS — BP 110/68 | HR 71 | Ht 62.0 in | Wt 174.0 lb

## 2016-01-13 DIAGNOSIS — R0782 Intercostal pain: Secondary | ICD-10-CM

## 2016-01-13 DIAGNOSIS — J449 Chronic obstructive pulmonary disease, unspecified: Secondary | ICD-10-CM | POA: Diagnosis not present

## 2016-01-13 DIAGNOSIS — J9601 Acute respiratory failure with hypoxia: Secondary | ICD-10-CM | POA: Diagnosis not present

## 2016-01-13 DIAGNOSIS — R079 Chest pain, unspecified: Secondary | ICD-10-CM

## 2016-01-13 DIAGNOSIS — J309 Allergic rhinitis, unspecified: Secondary | ICD-10-CM

## 2016-01-13 HISTORY — DX: Chest pain, unspecified: R07.9

## 2016-01-13 HISTORY — DX: Allergic rhinitis, unspecified: J30.9

## 2016-01-13 MED ORDER — MONTELUKAST SODIUM 10 MG PO TABS: 10.0000 mg | ORAL_TABLET | Freq: Every day | ORAL | 3 refills | Status: DC

## 2016-01-13 MED ORDER — TIOTROPIUM BROMIDE MONOHYDRATE 2.5 MCG/ACT IN AERS: 2.0000 | INHALATION_SPRAY | Freq: Every day | RESPIRATORY_TRACT | 5 refills | Status: DC

## 2016-01-13 NOTE — Patient Instructions (Signed)
   Continue using your nebulizer and inhaler medications as prescribed.  We will do a breathing and walking test at your next appointment.  Call me if you have any new breathing problems before your next appointment.  TESTS ORDERED: 1. CXR PA/LAT TODAY 2. Spirometry with bronchodilator challenge at next appointment 3. on room air at next appointment

## 2016-01-13 NOTE — Progress Notes (Signed)
Allergies percent   Subjective:    Patient ID: Satin Boal, female    DOB: 01-09-1951, 65 y.o.   MRN: [id number]  C.C.:  Follow-up for Moderate COPD, Acute Hypoxic Respiratory Failure, Chronic Steroid Therapy, & Chronic Allergic Rhinitis.  HPI  Discharged on 12/10/15 after admission for COPD exacerbation, NSTEMI, and CAP.  Moderate COPD: Previously prescribed Symbicort & Spiriva. After hospitalization patient switched to Pulmicort & Perforomist nebulized bid from Symbicort. Also started on Daliresp at 11/20 follow-up. She reports her dyspnea has improved. No coughing or wheezing. Hasn't needed her rescue medication.   Acute Hypoxic Respiratory Failure: Prescribed oxygen at 2 L/m at rest and 4 L/m with walking after recent hospitalization.  Chronic Steroid Therapy: Prescribed Prednisone 10mg  daily. She started back on Prednisone in September 2016.  Chronic Allergic Rhinitis:  On Zyrtec & Singulair. Denies any sinus congestion or drainage. No sinus pressure.   Review of Systems She reports she did fall a couple of days ago. She reports a dull ache since this in her anterior and left lateral chest. No pleurisy. No fever, chills, or sweats. No syncope but does have periods of near sycnope.   No Known Allergies  Current Outpatient Prescriptions on File Prior to Visit  Medication Sig Dispense Refill  . albuterol (PROVENTIL HFA;VENTOLIN HFA) 108 (90 Base) MCG/ACT inhaler Inhale into the lungs every 6 (six) hours as needed for wheezing or shortness of breath.    Marland Kitchen albuterol (PROVENTIL) (2.5 MG/3ML) 0.083% nebulizer solution Take 2.5 mg by nebulization every 6 (six) hours as needed for wheezing or shortness of breath.    Marland Kitchen aspirin 81 MG tablet Take 81 mg by mouth daily.    Marland Kitchen atorvastatin (LIPITOR) 40 MG tablet Take 1 tablet (40 mg total) by mouth daily at 6 PM. 30 tablet 3  . budesonide (PULMICORT) 0.5 MG/2ML nebulizer solution Take 2 mLs (0.5 mg total) by nebulization 2 (two) times daily. DX:  J44.9 120 mL 3  . cetirizine (ZYRTEC) 10 MG tablet Take 10 mg by mouth daily.    Marland Kitchen esomeprazole (NEXIUM) 40 MG capsule Take 40 mg by mouth daily as needed (for acid reflex).     . formoterol (PERFOROMIST) 20 MCG/2ML nebulizer solution Take 2 mLs (20 mcg total) by nebulization 2 (two) times daily. DX: J44.9 120 mL 3  . isosorbide mononitrate (IMDUR) 30 MG 24 hr tablet Take 1 tablet (30 mg total) by mouth daily. 30 tablet 3  . montelukast (SINGULAIR) 10 MG tablet TAKE 1 TABLET (10 MG TOTAL) BY MOUTH AT BEDTIME. 30 tablet 3  . predniSONE (DELTASONE) 10 MG tablet Take 1 tablet (10 mg total) by mouth daily with breakfast. 30 tablet 1  . roflumilast (DALIRESP) 500 MCG TABS tablet Take 1 tablet (500 mcg total) by mouth daily. 30 tablet 4  . Spacer/Aero-Holding Chambers (AEROCHAMBER MV) inhaler Use as instructed 1 each 0   No current facility-administered medications on file prior to visit.     Past Medical History:  Diagnosis Date  . Acute DVT (deep venous thrombosis) (HCC)   . Allergic rhinitis   . Atrophic vaginitis   . COPD (chronic obstructive pulmonary disease) (HCC)   . Hypertension   . Low back pain   . Umbilical hernia     Past Surgical History:  Procedure Laterality Date  . arthroscopic knee surgery Right   . TUBAL LIGATION      Family History  Problem Relation Age of Onset  . Emphysema Mother   . Cirrhosis Father  Social History   Social History  . Marital status: Married    Spouse name: N/A  . Number of children: N/A  . Years of education: N/A   Occupational History  . Retired    Social History Main Topics  . Smoking status: Former Smoker    Packs/day: 1.50    Years: 46.00    Types: Cigarettes    Start date: 01/31/1964    Quit date: 01/30/2010  . Smokeless tobacco: Never Used  . Alcohol use No  . Drug use: No  . Sexual activity: No   Other Topics Concern  . None   Social History Narrative   [facility]:   Originally from [place] Moved to [place] in  1986. [place] [facility] [place]. 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 Has an outside dog. No bird or mold exposure. She does have a musty smell in her bedroom.       Objective:   Physical Exam BP 110/68 (BP Location: Left Arm, Cuff Size: Normal)   Pulse 71   Ht 5\' 2"  (1.575 m)   Wt 174 lb (78.9 kg)   SpO2 98%   BMI 31.83 kg/m  General:  Awake. No distress. Accompanied by daughter today. Integument:  Warm & dry. No rash on exposed skin.  Lymphatics:  No appreciated cervical or supraclavicular lymphadenoapthy. HEENT:  Moist mucus membranes. No scleral injection. No nasal turbinate swelling. Cardiovascular:  Regular rate. No edema. Normal S1 & S2.  Pulmonary:  Clear bilaterally to auscultation. Normal work of breathing on room air and oxygen. Speaking in complete sentences. Abdomen: Soft. Normal bowel sounds. Nontender. Musculoskeletal: No chest wall deformity. Pain with palpation of left chest wall in mid axillary line.  PFT 07/13/15: FVC 2.09 L (73%) FEV1 1.45 L (66%) FEV1/FVC 0.69 FEF 25-75 0.92 L (47%) positive bronchodilator response TLC 5.56 liters (117%) RV 153% ERV 37% DLCO uncorrected 62%  6MWT 01/13/16:  Walked 3 laps / Baseline Sat 98% on RA/ Nadir Sat 95% on RA 07/21/15: Walked 372 meters / Baseline Sat 94% on RA / Nadir Sat 94% on RA  IMAGING CXR PA/LAT 02/22/15 (per radiologist): Bibasilar scarring or atelectasis. Heart normal in size. No effusion or bony abnormality.  CXR PA/LAT 02/25/11 (per radiologist): Lungs are clear with cardiac silhouette & visualized bony skeleton unremarkable. No evidence of acute cardiopulmonary disease.   CARDIAC EKG 06/08/15 (previously reviewed by me): Normal sinus rhythm. QTC 406 ms. Upright T waves & no ST segment depression to suggest ischemia.  MICROBIOLOGY Sputum Ctx (07/22/15):  Oral Flora / Fungus Negative / AFB Negative   LABS 06/08/15 Tropoinin I: 0.00 IgG: 648 IgA: 248 IgM: 126 IgE:  15 RAST Panel: Negative Alpha-1 antitrypsin: MM (169) CBC: 11.9/16.0/47.4/348  02/26/11 CBC: 8.7/15.8/47.2/238 BMP: 146/3.7/112/23/12/0.58/137/9.3    Assessment & Plan:  65 y.o. female with moderate COPD, acute hypoxic respiratory failure, chronic steroid therapy, & chronic allergic rhinitis. Patient seems to have recovered well from her recent hospitalization with community acquired pneumonia. Her COPD seems to be very well-controlled at this time. Hopefully we will be able to transition back to Symbicort) upon her pulmonary function testing at follow-up and her symptom control. Given her recent fall and chronic prednisone therapy I am concerned that the patient could have rib fractures and this will need to be assessed with chest x-ray imaging. Her allergies seem to be well-controlled on her current regimen. I instructed the patient contact my office if she developed  any new breathing problems or have questions before her next appointment.  1. Moderate COPD:  Continuing patient on Perforomist and budesonide nebulized twice daily. Continuing Spiriva Respimat & Daliresp. Repeat spirometry with bronchodilator challenge at next appointment. 2. Acute Hypoxic Respiratory Failure: Resolved. Checking 6 minute walk test on room air at next appointment. 3. Chest Wall Pain: Checking chest x-ray PA/LAT today given recent fall. 4. Chronic Steroid Therapy: Prescribed Prednisone 10 mg by mouth daily. Holding on prednisone taper at this time. 5. Chronic Allergic Rhinitis: Continuing Singulair & Zyrtec. Singulair refill sent. 6. NSTEMI:  Has followup with Dr. Katrinka BlazingSmith with Cardiology.  7. Health Maintenance: S/P Influenza Vaccine September 2017, Pneumovax January 2013 & Prevnar 13 Vaccine January 2015. 8. Follow-up: Patient to return to clinic in 2 months or sooner if needed.  Donna ChristenJennings E. Jamison NeighborNestor, M.D. [facility] Pager:  [phone] After 3pm or if no response, call [phone] 12:38 PM  01/13/16

## 2016-02-10 ENCOUNTER — Encounter: Payer: Self-pay | Admitting: Interventional Cardiology

## 2016-02-10 ENCOUNTER — Encounter: Payer: Self-pay | Admitting: *Deleted

## 2016-02-10 ENCOUNTER — Ambulatory Visit (INDEPENDENT_AMBULATORY_CARE_PROVIDER_SITE_OTHER): Payer: Managed Care, Other (non HMO) | Admitting: Interventional Cardiology

## 2016-02-10 VITALS — BP 120/72 | HR 81 | Ht 62.0 in | Wt 173.8 lb

## 2016-02-10 DIAGNOSIS — R0609 Other forms of dyspnea: Secondary | ICD-10-CM

## 2016-02-10 DIAGNOSIS — I214 Non-ST elevation (NSTEMI) myocardial infarction: Secondary | ICD-10-CM | POA: Diagnosis not present

## 2016-02-10 DIAGNOSIS — I10 Essential (primary) hypertension: Secondary | ICD-10-CM

## 2016-02-10 DIAGNOSIS — Z01812 Encounter for preprocedural laboratory examination: Secondary | ICD-10-CM

## 2016-02-10 DIAGNOSIS — R079 Chest pain, unspecified: Secondary | ICD-10-CM | POA: Diagnosis not present

## 2016-02-10 MED ORDER — ATORVASTATIN CALCIUM 40 MG PO TABS: 40.0000 mg | ORAL_TABLET | Freq: Every day | ORAL | 3 refills | Status: DC

## 2016-02-10 MED ORDER — ISOSORBIDE MONONITRATE ER 30 MG PO TB24: 30.0000 mg | ORAL_TABLET | Freq: Every day | ORAL | 3 refills | Status: DC

## 2016-02-10 NOTE — Patient Instructions (Signed)
Medication Instructions:  None  Labwork: Come in for pre procedure labs on February 24, 2016.   Testing/Procedures: Your physician has requested that you have a cardiac catheterization. Cardiac catheterization is used to diagnose and/or treat various heart conditions. Doctors may recommend this procedure for a number of different reasons. The most common reason is to evaluate chest pain. Chest pain can be a symptom of coronary artery disease (CAD), and cardiac catheterization can show whether plaque is narrowing or blocking your heart's arteries. This procedure is also used to evaluate the valves, as well as measure the blood flow and oxygen levels in different parts of your heart. For further information please visit [email or web address] Please follow instruction sheet, as given.    Follow-Up: Your physician recommends that you schedule a follow-up appointment will be based on your catheterization.    Any Other Special Instructions Will Be Listed Below (If Applicable).     If you need a refill on your cardiac medications before your next appointment, please call your pharmacy.

## 2016-02-10 NOTE — Progress Notes (Signed)
Cardiology Office Note    Date:  02/10/2016   ID:  Erin Morrison, DOB 10-04-1950, MRN [id number]  PCP:  Lonie Peak, PA-C  Cardiologist: Lesleigh Noe, MD   Chief Complaint  Patient presents with  . Coronary Artery Disease    History of Present Illness:  Erin Morrison is a 66 y.o. female after recent hospitalization for COPD decompensation with acute respiratory failure, acute DVT, elevated cardiac markers and wall motion abnormality on echo compatible with non-ST elevation inferolateral myocardial infarction.  Doing well, has begun walking but notices pressure in the chest and shortness of breath with activity area she had a non-ST elevation MI while hospitalized with respiratory failure in late 2017.  She has marked anxiety concerning the possibility of heart disease. She is concerned however because something is wrong which prevents her from performing typical physical activities. She denies orthopnea. There is no lower extremity swelling. No prolonged palpitations. No neurological complaints. She is accompanied by her daughter.   Past Medical History:  Diagnosis Date  . Acute DVT (deep venous thrombosis) (HCC)   . Allergic rhinitis   . Atrophic vaginitis   . COPD (chronic obstructive pulmonary disease) (HCC)   . Hypertension   . Low back pain   . Umbilical hernia     Past Surgical History:  Procedure Laterality Date  . arthroscopic knee surgery Right   . TUBAL LIGATION      Current Medications: Outpatient Medications Prior to Visit  Medication Sig Dispense Refill  . albuterol (PROVENTIL HFA;VENTOLIN HFA) 108 (90 Base) MCG/ACT inhaler Inhale into the lungs every 6 (six) hours as needed for wheezing or shortness of breath.    Marland Kitchen albuterol (PROVENTIL) (2.5 MG/3ML) 0.083% nebulizer solution Take 2.5 mg by nebulization every 6 (six) hours as needed for wheezing or shortness of breath.    Marland Kitchen aspirin 81 MG tablet Take 81 mg by mouth daily.    Marland Kitchen atorvastatin  (LIPITOR) 40 MG tablet Take 1 tablet (40 mg total) by mouth daily at 6 PM. 30 tablet 3  . budesonide (PULMICORT) 0.5 MG/2ML nebulizer solution Take 2 mLs (0.5 mg total) by nebulization 2 (two) times daily. DX: J44.9 120 mL 3  . cetirizine (ZYRTEC) 10 MG tablet Take 10 mg by mouth daily.    Marland Kitchen esomeprazole (NEXIUM) 40 MG capsule Take 40 mg by mouth daily as needed (for acid reflex).     . formoterol (PERFOROMIST) 20 MCG/2ML nebulizer solution Take 2 mLs (20 mcg total) by nebulization 2 (two) times daily. DX: J44.9 120 mL 3  . isosorbide mononitrate (IMDUR) 30 MG 24 hr tablet Take 1 tablet (30 mg total) by mouth daily. 30 tablet 3  . montelukast (SINGULAIR) 10 MG tablet Take 1 tablet (10 mg total) by mouth at bedtime. 30 tablet 3  . predniSONE (DELTASONE) 10 MG tablet Take 1 tablet (10 mg total) by mouth daily with breakfast. 30 tablet 1  . roflumilast (DALIRESP) 500 MCG TABS tablet Take 1 tablet (500 mcg total) by mouth daily. 30 tablet 4  . Spacer/Aero-Holding Chambers (AEROCHAMBER MV) inhaler Use as instructed 1 each 0  . Tiotropium Bromide Monohydrate (SPIRIVA RESPIMAT) 2.5 MCG/ACT AERS Inhale 2 puffs into the lungs daily. 1 Inhaler 5   No facility-administered medications prior to visit.      Allergies:   Patient has no known allergies.   Social History   Social History  . Marital status: Married    Spouse name: N/A  . Number of children:  N/A  . Years of education: N/A   Occupational History  . Retired    Social History Main Topics  . Smoking status: Former Smoker    Packs/day: 1.50    Years: 46.00    Types: Cigarettes    Start date: 01/31/1964    Quit date: 01/30/2010  . Smokeless tobacco: Never Used  . Alcohol use No  . Drug use: No  . Sexual activity: No   Other Topics Concern  . None   Social History Narrative   Adult nurse Pulmonary:   Originally from [place]. Moved to [place] in 1986. Has traveled to [facility]. 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 Has an outside dog. No bird or mold exposure. She does have a musty smell in her bedroom.      Family History:  The patient's family history includes Cirrhosis in her father; Emphysema in her mother.   ROS:   Please see the history of present illness.    Shortness breath, vision disturbance, chest pain with exertion, facial swelling, anxiety, headaches, dizziness, easy bruising.  All other systems reviewed and are negative.   PHYSICAL EXAM:   VS:  BP 120/72 (BP Location: Left Arm)   Pulse 81   Ht 5\' 2"  (1.575 m)   Wt 173 lb 12.8 oz (78.8 kg)   BMI 31.79 kg/m    GEN: Well nourished, well developed, in no acute distress  HEENT: normal  Neck: no JVD, carotid bruits, or masses Cardiac: RRR; no murmurs, rubs, or gallops,no edema  Respiratory:  clear to auscultation bilaterally, normal work of breathing GI: soft, nontender, nondistended, + BS MS: no deformity or atrophy  Skin: warm and dry, no rash Neuro:  Alert and Oriented x 3, Strength and sensation are intact Psych: euthymic mood, full affect  Wt Readings from Last 3 Encounters:  02/10/16 173 lb 12.8 oz (78.8 kg)  01/13/16 174 lb (78.9 kg)  12/22/15 168 lb 12.8 oz (76.6 kg)      Studies/Labs Reviewed:   EKG:  EKG  Not repeated  Recent Labs: 12/03/2015: ALT 21 12/04/2015: Magnesium 2.1; TSH 0.750 12/08/2015: BUN 23; Creatinine, Ser 0.65; Hemoglobin 15.1; Platelets 287; Potassium 4.0; Sodium 139   Lipid Panel    Component Value Date/Time   CHOL 207 (H) 12/04/2015 0511   TRIG 79 12/04/2015 0511   HDL 57 12/04/2015 0511   CHOLHDL 3.6 12/04/2015 0511   VLDL 16 12/04/2015 0511   LDLCALC 134 (H) 12/04/2015 0511    Additional studies/ records that were reviewed today include:  No new data    ASSESSMENT:    1. Chest pain, exertional   2. Dyspnea on exertion   3. NSTEMI (non-ST elevated myocardial infarction) (HCC)   4. Essential hypertension      PLAN:  In order of problems listed  above:  1. Concerning for angina. With history of non-ST elevation MI and regional wall motion abnormality on echo, we will proceed with coronary angiography to define anatomy.  The patient was counseled to undergo left heart catheterization, coronary angiography, and possible percutaneous coronary intervention with stent implantation. The procedural risks and benefits were discussed in detail. The risks discussed included death, stroke, myocardial infarction, life-threatening bleeding, limb ischemia, kidney injury, allergy, and possible emergency cardiac surgery. The risk of these significant complications were estimated to occur less than 1% of the time. After discussion, the patient has agreed to proceed.  2. Dyspnea probably a combination of both  coronary disease and COPD.  3. Coronary angiography will be done to define structural/coronary anatomy.  4. 2 g sodium diet is recommended.  Clinical follow-up will be planned after data from coronary angiography is available. Prolonged office visit related to discussion of rationale and risks of coronary angiography/PCI. Also extended because of scheduling required. We also discussed management of anxiety in the setting of the procedure. Greater than 50% of this office visit was spent in counseling/education.  Medication Adjustments/Labs and Tests Ordered: Current medicines are reviewed at length with the patient today.  Concerns regarding medicines are outlined above.  Medication changes, Labs and Tests ordered today are listed in the Patient Instructions below. There are no Patient Instructions on file for this visit.   Signed, Lesleigh NoeHenry W Ayza Ripoll III, MD  02/10/2016 11:54 AM    [facility] [address], [place], [place]  [address] Phone: [phone]; Fax: [phone]

## 2016-02-11 ENCOUNTER — Other Ambulatory Visit: Payer: Self-pay | Admitting: Adult Health

## 2016-02-24 ENCOUNTER — Other Ambulatory Visit: Payer: Managed Care, Other (non HMO) | Admitting: *Deleted

## 2016-02-24 DIAGNOSIS — J410 Simple chronic bronchitis: Secondary | ICD-10-CM

## 2016-02-24 DIAGNOSIS — J9601 Acute respiratory failure with hypoxia: Secondary | ICD-10-CM

## 2016-02-24 DIAGNOSIS — I10 Essential (primary) hypertension: Secondary | ICD-10-CM

## 2016-02-24 DIAGNOSIS — E7800 Pure hypercholesterolemia, unspecified: Secondary | ICD-10-CM

## 2016-02-24 DIAGNOSIS — Z92241 Personal history of systemic steroid therapy: Secondary | ICD-10-CM

## 2016-02-24 DIAGNOSIS — I214 Non-ST elevation (NSTEMI) myocardial infarction: Secondary | ICD-10-CM

## 2016-02-24 DIAGNOSIS — I200 Unstable angina: Secondary | ICD-10-CM

## 2016-02-24 DIAGNOSIS — J301 Allergic rhinitis due to pollen: Secondary | ICD-10-CM

## 2016-02-24 DIAGNOSIS — J449 Chronic obstructive pulmonary disease, unspecified: Secondary | ICD-10-CM

## 2016-02-24 NOTE — Addendum Note (Signed)
Addended by: Tonita PhoenixBOWDEN, ROBIN K on: 02/24/2016 12:29 PM   Modules accepted: Orders

## 2016-02-25 ENCOUNTER — Other Ambulatory Visit: Payer: Self-pay | Admitting: Interventional Cardiology

## 2016-02-25 DIAGNOSIS — I214 Non-ST elevation (NSTEMI) myocardial infarction: Secondary | ICD-10-CM

## 2016-02-25 LAB — BASIC METABOLIC PANEL
BUN / CREAT RATIO: 19 (ref 12–28)
BUN: 13 mg/dL (ref 8–27)
CALCIUM: 10.3 mg/dL (ref 8.7–10.3)
CHLORIDE: 101 mmol/L (ref 96–106)
CO2: 21 mmol/L (ref 18–29)
CREATININE: 0.67 mg/dL (ref 0.57–1.00)
GFR calc Af Amer: 105 mL/min/{1.73_m2} (ref >59)
GFR calc non Af Amer: 91 mL/min/{1.73_m2} (ref >59)
GLUCOSE: 90 mg/dL (ref 65–99)
Potassium: 4.4 mmol/L (ref 3.5–5.2)
Sodium: 142 mmol/L (ref 134–144)

## 2016-02-25 LAB — CBC WITH DIFFERENTIAL/PLATELET
BASOS ABS: 0 10*3/uL (ref 0.0–0.2)
Basos: 0 %
EOS (ABSOLUTE): 0.1 10*3/uL (ref 0.0–0.4)
EOS: 1 %
HEMATOCRIT: 42.1 % (ref 34.0–46.6)
HEMOGLOBIN: 14.4 g/dL (ref 11.1–15.9)
IMMATURE GRANS (ABS): 0 10*3/uL (ref 0.0–0.1)
IMMATURE GRANULOCYTES: 0 %
LYMPHS ABS: 1.6 10*3/uL (ref 0.7–3.1)
LYMPHS: 13 %
MCH: 30.3 pg (ref 26.6–33.0)
MCHC: 34.2 g/dL (ref 31.5–35.7)
MCV: 89 fL (ref 79–97)
MONOCYTES: 4 %
Monocytes Absolute: 0.5 10*3/uL (ref 0.1–0.9)
NEUTROS PCT: 82 %
Neutrophils Absolute: 10 10*3/uL — ABNORMAL HIGH (ref 1.4–7.0)
Platelets: 384 10*3/uL — ABNORMAL HIGH (ref 150–379)
RBC: 4.75 x10E6/uL (ref 3.77–5.28)
RDW: 13.3 % (ref 12.3–15.4)
WBC: 12.3 10*3/uL — ABNORMAL HIGH (ref 3.4–10.8)

## 2016-02-25 LAB — PROTIME-INR

## 2016-03-01 ENCOUNTER — Other Ambulatory Visit: Payer: Self-pay | Admitting: Pulmonary Disease

## 2016-03-01 DIAGNOSIS — J449 Chronic obstructive pulmonary disease, unspecified: Secondary | ICD-10-CM

## 2016-03-03 ENCOUNTER — Encounter (HOSPITAL_COMMUNITY): Payer: Self-pay | Admitting: Interventional Cardiology

## 2016-03-03 ENCOUNTER — Encounter (HOSPITAL_COMMUNITY)
Admission: RE | Disposition: A | Discharge status: Home / Self Care | Payer: Self-pay | Source: Ambulatory Visit | Attending: Interventional Cardiology

## 2016-03-03 ENCOUNTER — Ambulatory Visit (HOSPITAL_COMMUNITY)
Admission: RE | Admit: 2016-03-03 | Discharge: 2016-03-03 | Disposition: A | Discharge status: Home / Self Care | Payer: Managed Care, Other (non HMO) | Source: Ambulatory Visit | Attending: Interventional Cardiology | Admitting: Interventional Cardiology

## 2016-03-03 ENCOUNTER — Telehealth: Payer: Self-pay | Admitting: *Deleted

## 2016-03-03 DIAGNOSIS — M545 Low back pain: Secondary | ICD-10-CM | POA: Diagnosis not present

## 2016-03-03 DIAGNOSIS — I272 Pulmonary hypertension, unspecified: Secondary | ICD-10-CM | POA: Diagnosis not present

## 2016-03-03 DIAGNOSIS — Z7951 Long term (current) use of inhaled steroids: Secondary | ICD-10-CM | POA: Insufficient documentation

## 2016-03-03 DIAGNOSIS — I214 Non-ST elevation (NSTEMI) myocardial infarction: Secondary | ICD-10-CM | POA: Diagnosis not present

## 2016-03-03 DIAGNOSIS — Z7952 Long term (current) use of systemic steroids: Secondary | ICD-10-CM | POA: Insufficient documentation

## 2016-03-03 DIAGNOSIS — F419 Anxiety disorder, unspecified: Secondary | ICD-10-CM | POA: Diagnosis not present

## 2016-03-03 DIAGNOSIS — Z7982 Long term (current) use of aspirin: Secondary | ICD-10-CM | POA: Diagnosis not present

## 2016-03-03 DIAGNOSIS — Z87891 Personal history of nicotine dependence: Secondary | ICD-10-CM | POA: Insufficient documentation

## 2016-03-03 DIAGNOSIS — I10 Essential (primary) hypertension: Secondary | ICD-10-CM | POA: Diagnosis not present

## 2016-03-03 DIAGNOSIS — Z86718 Personal history of other venous thrombosis and embolism: Secondary | ICD-10-CM | POA: Insufficient documentation

## 2016-03-03 DIAGNOSIS — I252 Old myocardial infarction: Secondary | ICD-10-CM | POA: Diagnosis not present

## 2016-03-03 DIAGNOSIS — I2510 Atherosclerotic heart disease of native coronary artery without angina pectoris: Secondary | ICD-10-CM | POA: Diagnosis not present

## 2016-03-03 DIAGNOSIS — J449 Chronic obstructive pulmonary disease, unspecified: Secondary | ICD-10-CM | POA: Diagnosis not present

## 2016-03-03 HISTORY — PX: CARDIAC CATHETERIZATION: SHX172

## 2016-03-03 LAB — POCT I-STAT 3, VENOUS BLOOD GAS (G3P V)
ACID-BASE DEFICIT: 2 mmol/L (ref 0.0–2.0)
BICARBONATE: 22.3 mmol/L (ref 20.0–28.0)
O2 SAT: 65 %
PCO2 VEN: 36.8 mmHg — AB (ref 44.0–60.0)
PO2 VEN: 34 mmHg (ref 32.0–45.0)
TCO2: 23 mmol/L (ref 0–100)
pH, Ven: 7.391 (ref 7.250–7.430)

## 2016-03-03 LAB — POCT I-STAT 3, ART BLOOD GAS (G3+)
Acid-base deficit: 2 mmol/L (ref 0.0–2.0)
BICARBONATE: 22 mmol/L (ref 20.0–28.0)
O2 SAT: 94 %
TCO2: 23 mmol/L (ref 0–100)
pCO2 arterial: 34 mmHg (ref 32.0–48.0)
pH, Arterial: 7.419 (ref 7.350–7.450)
pO2, Arterial: 68 mmHg — ABNORMAL LOW (ref 83.0–108.0)

## 2016-03-03 LAB — PROTIME-INR
INR: 1.06
Prothrombin Time: 13.8 seconds (ref 11.4–15.2)

## 2016-03-03 SURGERY — RIGHT/LEFT HEART CATH AND CORONARY ANGIOGRAPHY

## 2016-03-03 MED ORDER — FENTANYL CITRATE (PF) 100 MCG/2ML IJ SOLN
INTRAMUSCULAR | Status: DC | PRN
  Administered 2016-03-03: 50 ug via INTRAVENOUS

## 2016-03-03 MED ORDER — SODIUM CHLORIDE 0.9 % IV SOLN: 250.0000 mL | INTRAVENOUS | Status: DC | PRN

## 2016-03-03 MED ORDER — ASPIRIN 81 MG PO CHEW: 81.0000 mg | CHEWABLE_TABLET | Freq: Every day | ORAL | Status: DC

## 2016-03-03 MED ORDER — HEPARIN SODIUM (PORCINE) 1000 UNIT/ML IJ SOLN
INTRAMUSCULAR | Status: AC
  Filled 2016-03-03: qty 1

## 2016-03-03 MED ORDER — HEPARIN (PORCINE) IN NACL 2-0.9 UNIT/ML-% IJ SOLN
INTRAMUSCULAR | Status: DC | PRN
  Administered 2016-03-03: 1000 mL

## 2016-03-03 MED ORDER — HEPARIN (PORCINE) IN NACL 2-0.9 UNIT/ML-% IJ SOLN
INTRAMUSCULAR | Status: DC | PRN
  Administered 2016-03-03: 10 mL via INTRA_ARTERIAL

## 2016-03-03 MED ORDER — LIDOCAINE HCL (PF) 1 % IJ SOLN
INTRAMUSCULAR | Status: AC
  Filled 2016-03-03: qty 30

## 2016-03-03 MED ORDER — SODIUM CHLORIDE 0.9 % WEIGHT BASED INFUSION: 1.0000 mL/kg/h | INTRAVENOUS | Status: DC

## 2016-03-03 MED ORDER — OXYCODONE-ACETAMINOPHEN 5-325 MG PO TABS: 1.0000 | ORAL_TABLET | ORAL | Status: DC | PRN

## 2016-03-03 MED ORDER — MIDAZOLAM HCL 2 MG/2ML IJ SOLN
INTRAMUSCULAR | Status: DC | PRN
  Administered 2016-03-03: 1 mg via INTRAVENOUS

## 2016-03-03 MED ORDER — LIDOCAINE HCL (PF) 1 % IJ SOLN
INTRAMUSCULAR | Status: DC | PRN
  Administered 2016-03-03 (×2): 2 mL via INTRADERMAL

## 2016-03-03 MED ORDER — HEPARIN SODIUM (PORCINE) 1000 UNIT/ML IJ SOLN
INTRAMUSCULAR | Status: DC | PRN
  Administered 2016-03-03: 4000 [IU] via INTRAVENOUS

## 2016-03-03 MED ORDER — SODIUM CHLORIDE 0.9 % WEIGHT BASED INFUSION
3.0000 mL/kg/h | INTRAVENOUS | Status: AC
  Administered 2016-03-03: 3 mL/kg/h via INTRAVENOUS

## 2016-03-03 MED ORDER — MIDAZOLAM HCL 2 MG/2ML IJ SOLN
INTRAMUSCULAR | Status: AC
  Filled 2016-03-03: qty 2

## 2016-03-03 MED ORDER — ACETAMINOPHEN 325 MG PO TABS: 650.0000 mg | ORAL_TABLET | ORAL | Status: DC | PRN

## 2016-03-03 MED ORDER — ONDANSETRON HCL 4 MG/2ML IJ SOLN: 4.0000 mg | Freq: Four times a day (QID) | INTRAMUSCULAR | Status: DC | PRN

## 2016-03-03 MED ORDER — IOPAMIDOL (ISOVUE-370) INJECTION 76%
INTRAVENOUS | Status: AC
  Filled 2016-03-03: qty 100

## 2016-03-03 MED ORDER — SODIUM CHLORIDE 0.9% FLUSH: 3.0000 mL | Freq: Two times a day (BID) | INTRAVENOUS | Status: DC

## 2016-03-03 MED ORDER — VERAPAMIL HCL 2.5 MG/ML IV SOLN
INTRAVENOUS | Status: AC
  Filled 2016-03-03: qty 2

## 2016-03-03 MED ORDER — SODIUM CHLORIDE 0.9 % IV SOLN: INTRAVENOUS | Status: AC

## 2016-03-03 MED ORDER — SODIUM CHLORIDE 0.9% FLUSH: 3.0000 mL | INTRAVENOUS | Status: DC | PRN

## 2016-03-03 MED ORDER — FENTANYL CITRATE (PF) 100 MCG/2ML IJ SOLN
INTRAMUSCULAR | Status: AC
  Filled 2016-03-03: qty 2

## 2016-03-03 MED ORDER — HEPARIN (PORCINE) IN NACL 2-0.9 UNIT/ML-% IJ SOLN
INTRAMUSCULAR | Status: AC
  Filled 2016-03-03: qty 1000

## 2016-03-03 MED ORDER — ASPIRIN 81 MG PO CHEW: 81.0000 mg | CHEWABLE_TABLET | ORAL | Status: AC

## 2016-03-03 MED ORDER — IOPAMIDOL (ISOVUE-370) INJECTION 76%
INTRAVENOUS | Status: DC | PRN
  Administered 2016-03-03: 90 mL via INTRA_ARTERIAL

## 2016-03-03 SURGICAL SUPPLY — 12 items
CATH BALLN WEDGE 5F 110CM (CATHETERS) ×1 IMPLANT
CATH INFINITI 5 FR JL3.5 (CATHETERS) ×1 IMPLANT
CATH INFINITI JR4 5F (CATHETERS) ×1 IMPLANT
DEVICE RAD COMP TR BAND LRG (VASCULAR PRODUCTS) ×1 IMPLANT
GLIDESHEATH SLEND A-KIT 6F 22G (SHEATH) ×1 IMPLANT
GUIDEWIRE .025 260CM (WIRE) ×1 IMPLANT
KIT HEART LEFT (KITS) ×2 IMPLANT
PACK CARDIAC CATHETERIZATION (CUSTOM PROCEDURE TRAY) ×2 IMPLANT
SHEATH FAST CATH BRACH 5F 5CM (SHEATH) ×1 IMPLANT
TRANSDUCER W/STOPCOCK (MISCELLANEOUS) ×2 IMPLANT
TUBING CIL FLEX 10 FLL-RA (TUBING) ×2 IMPLANT
WIRE HI TORQ VERSACORE-J 145CM (WIRE) ×1 IMPLANT

## 2016-03-03 NOTE — H&P (View-Only) (Signed)
Cardiology Office Note    Date:  02/10/2016   ID:  Erin Morrison, DOB 10-04-1950, MRN [id number]  PCP:  Lonie Peak, PA-C  Cardiologist: Lesleigh Noe, MD   Chief Complaint  Patient presents with  . Coronary Artery Disease    History of Present Illness:  Erin Morrison is a 66 y.o. female after recent hospitalization for COPD decompensation with acute respiratory failure, acute DVT, elevated cardiac markers and wall motion abnormality on echo compatible with non-ST elevation inferolateral myocardial infarction.  Doing well, has begun walking but notices pressure in the chest and shortness of breath with activity area she had a non-ST elevation MI while hospitalized with respiratory failure in late 2017.  She has marked anxiety concerning the possibility of heart disease. She is concerned however because something is wrong which prevents her from performing typical physical activities. She denies orthopnea. There is no lower extremity swelling. No prolonged palpitations. No neurological complaints. She is accompanied by her daughter.   Past Medical History:  Diagnosis Date  . Acute DVT (deep venous thrombosis) (HCC)   . Allergic rhinitis   . Atrophic vaginitis   . COPD (chronic obstructive pulmonary disease) (HCC)   . Hypertension   . Low back pain   . Umbilical hernia     Past Surgical History:  Procedure Laterality Date  . arthroscopic knee surgery Right   . TUBAL LIGATION      Current Medications: Outpatient Medications Prior to Visit  Medication Sig Dispense Refill  . albuterol (PROVENTIL HFA;VENTOLIN HFA) 108 (90 Base) MCG/ACT inhaler Inhale into the lungs every 6 (six) hours as needed for wheezing or shortness of breath.    Marland Kitchen albuterol (PROVENTIL) (2.5 MG/3ML) 0.083% nebulizer solution Take 2.5 mg by nebulization every 6 (six) hours as needed for wheezing or shortness of breath.    Marland Kitchen aspirin 81 MG tablet Take 81 mg by mouth daily.    Marland Kitchen atorvastatin  (LIPITOR) 40 MG tablet Take 1 tablet (40 mg total) by mouth daily at 6 PM. 30 tablet 3  . budesonide (PULMICORT) 0.5 MG/2ML nebulizer solution Take 2 mLs (0.5 mg total) by nebulization 2 (two) times daily. DX: J44.9 120 mL 3  . cetirizine (ZYRTEC) 10 MG tablet Take 10 mg by mouth daily.    Marland Kitchen esomeprazole (NEXIUM) 40 MG capsule Take 40 mg by mouth daily as needed (for acid reflex).     . formoterol (PERFOROMIST) 20 MCG/2ML nebulizer solution Take 2 mLs (20 mcg total) by nebulization 2 (two) times daily. DX: J44.9 120 mL 3  . isosorbide mononitrate (IMDUR) 30 MG 24 hr tablet Take 1 tablet (30 mg total) by mouth daily. 30 tablet 3  . montelukast (SINGULAIR) 10 MG tablet Take 1 tablet (10 mg total) by mouth at bedtime. 30 tablet 3  . predniSONE (DELTASONE) 10 MG tablet Take 1 tablet (10 mg total) by mouth daily with breakfast. 30 tablet 1  . roflumilast (DALIRESP) 500 MCG TABS tablet Take 1 tablet (500 mcg total) by mouth daily. 30 tablet 4  . Spacer/Aero-Holding Chambers (AEROCHAMBER MV) inhaler Use as instructed 1 each 0  . Tiotropium Bromide Monohydrate (SPIRIVA RESPIMAT) 2.5 MCG/ACT AERS Inhale 2 puffs into the lungs daily. 1 Inhaler 5   No facility-administered medications prior to visit.      Allergies:   Patient has no known allergies.   Social History   Social History  . Marital status: Married    Spouse name: N/A  . Number of children:  N/A  . Years of education: N/A   Occupational History  . Retired    Social History Main Topics  . Smoking status: Former Smoker    Packs/day: 1.50    Years: 46.00    Types: Cigarettes    Start date: 01/31/1964    Quit date: 01/30/2010  . Smokeless tobacco: Never Used  . Alcohol use No  . Drug use: No  . Sexual activity: No   Other Topics Concern  . None   Social History Narrative   Adult nurse Pulmonary:   Originally from [place]. Moved to [place] in 1986. Has traveled to [facility]. 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 Has an outside dog. No bird or mold exposure. She does have a musty smell in her bedroom.      Family History:  The patient's family history includes Cirrhosis in her father; Emphysema in her mother.   ROS:   Please see the history of present illness.    Shortness breath, vision disturbance, chest pain with exertion, facial swelling, anxiety, headaches, dizziness, easy bruising.  All other systems reviewed and are negative.   PHYSICAL EXAM:   VS:  BP 120/72 (BP Location: Left Arm)   Pulse 81   Ht 5\' 2"  (1.575 m)   Wt 173 lb 12.8 oz (78.8 kg)   BMI 31.79 kg/m    GEN: Well nourished, well developed, in no acute distress  HEENT: normal  Neck: no JVD, carotid bruits, or masses Cardiac: RRR; no murmurs, rubs, or gallops,no edema  Respiratory:  clear to auscultation bilaterally, normal work of breathing GI: soft, nontender, nondistended, + BS MS: no deformity or atrophy  Skin: warm and dry, no rash Neuro:  Alert and Oriented x 3, Strength and sensation are intact Psych: euthymic mood, full affect  Wt Readings from Last 3 Encounters:  02/10/16 173 lb 12.8 oz (78.8 kg)  01/13/16 174 lb (78.9 kg)  12/22/15 168 lb 12.8 oz (76.6 kg)      Studies/Labs Reviewed:   EKG:  EKG  Not repeated  Recent Labs: 12/03/2015: ALT 21 12/04/2015: Magnesium 2.1; TSH 0.750 12/08/2015: BUN 23; Creatinine, Ser 0.65; Hemoglobin 15.1; Platelets 287; Potassium 4.0; Sodium 139   Lipid Panel    Component Value Date/Time   CHOL 207 (H) 12/04/2015 0511   TRIG 79 12/04/2015 0511   HDL 57 12/04/2015 0511   CHOLHDL 3.6 12/04/2015 0511   VLDL 16 12/04/2015 0511   LDLCALC 134 (H) 12/04/2015 0511    Additional studies/ records that were reviewed today include:  No new data    ASSESSMENT:    1. Chest pain, exertional   2. Dyspnea on exertion   3. NSTEMI (non-ST elevated myocardial infarction) (HCC)   4. Essential hypertension      PLAN:  In order of problems listed  above:  1. Concerning for angina. With history of non-ST elevation MI and regional wall motion abnormality on echo, we will proceed with coronary angiography to define anatomy.  The patient was counseled to undergo left heart catheterization, coronary angiography, and possible percutaneous coronary intervention with stent implantation. The procedural risks and benefits were discussed in detail. The risks discussed included death, stroke, myocardial infarction, life-threatening bleeding, limb ischemia, kidney injury, allergy, and possible emergency cardiac surgery. The risk of these significant complications were estimated to occur less than 1% of the time. After discussion, the patient has agreed to proceed.  2. Dyspnea probably a combination of both  coronary disease and COPD.  3. Coronary angiography will be done to define structural/coronary anatomy.  4. 2 g sodium diet is recommended.  Clinical follow-up will be planned after data from coronary angiography is available. Prolonged office visit related to discussion of rationale and risks of coronary angiography/PCI. Also extended because of scheduling required. We also discussed management of anxiety in the setting of the procedure. Greater than 50% of this office visit was spent in counseling/education.  Medication Adjustments/Labs and Tests Ordered: Current medicines are reviewed at length with the patient today.  Concerns regarding medicines are outlined above.  Medication changes, Labs and Tests ordered today are listed in the Patient Instructions below. There are no Patient Instructions on file for this visit.   Signed, Lesleigh NoeHenry W Smith III, MD  02/10/2016 11:54 AM    [facility] [address], [place], [place]  [address] Phone: [phone]; Fax: [phone]

## 2016-03-03 NOTE — Discharge Instructions (Signed)
Radial Site Care °Introduction °Refer to this sheet in the next few weeks. These instructions provide you with information about caring for yourself after your procedure. Your health care provider may also give you more specific instructions. Your treatment has been planned according to current medical practices, but problems sometimes occur. Call your health care provider if you have any problems or questions after your procedure. °What can I expect after the procedure? °After your procedure, it is typical to have the following: °· Bruising at the radial site that usually fades within 1-2 weeks. °· Blood collecting in the tissue (hematoma) that may be painful to the touch. It should usually decrease in size and tenderness within 1-2 weeks. °Follow these instructions at home: °· Take medicines only as directed by your health care provider. °· You may shower 24-48 hours after the procedure or as directed by your health care provider. Remove the bandage (dressing) and gently wash the site with plain soap and water. Pat the area dry with a clean towel. Do not rub the site, because this may cause bleeding. °· Do not take baths, swim, or use a hot tub until your health care provider approves. °· Check your insertion site every day for redness, swelling, or drainage. °· Do not apply powder or lotion to the site. °· Do not flex or bend the affected arm for 24 hours or as directed by your health care provider. °· Do not push or pull heavy objects with the affected arm for 24 hours or as directed by your health care provider. °· Do not lift over 10 lb (4.5 kg) for 5 days after your procedure or as directed by your health care provider. °· Ask your health care provider when it is okay to: °¨ Return to work or school. °¨ Resume usual physical activities or sports. °¨ Resume sexual activity. °· Do not drive home if you are discharged the same day as the procedure. Have someone else drive you. °· You may drive 24 hours after the  procedure unless otherwise instructed by your health care provider. °· Do not operate machinery or power tools for 24 hours after the procedure. °· If your procedure was done as an outpatient procedure, which means that you went home the same day as your procedure, a responsible adult should be with you for the first 24 hours after you arrive home. °· Keep all follow-up visits as directed by your health care provider. This is important. °Contact a health care provider if: °· You have a fever. °· You have chills. °· You have increased bleeding from the radial site. Hold pressure on the site. °Get help right away if: °· You have unusual pain at the radial site. °· You have redness, warmth, or swelling at the radial site. °· You have drainage (other than a small amount of blood on the dressing) from the radial site. °· The radial site is bleeding, and the bleeding does not stop after 30 minutes of holding steady pressure on the site. °· Your arm or hand becomes pale, cool, tingly, or numb. °This information is not intended to replace advice given to you by your health care provider. Make sure you discuss any questions you have with your health care provider. °Document Released: 02/18/2010 Document Revised: 06/24/2015 Document Reviewed: 08/04/2013 °© 2017 Elsevier °Angiogram, Care After °These instructions give you information about caring for yourself after your procedure. Your doctor may also give you more specific instructions. Call your doctor if you have any   problems or questions after your procedure. °Follow these instructions at home: °· Take medicines only as told by your doctor. °· Follow your doctor's instructions about: °¨ Care of the area where the tube was inserted. °¨ Bandage (dressing) changes and removal. °· You may shower 24-48 hours after the procedure or as told by your doctor. °· Do not take baths, swim, or use a hot tub until your doctor approves. °· Every day, check the area where the tube was  inserted. Watch for: °¨ Redness, swelling, or pain. °¨ Fluid, blood, or pus. °· Do not apply powder or lotion to the site. °· Do not lift anything that is heavier than 10 lb (4.5 kg) for 5 days or as told by your doctor. °· Ask your doctor when you can: °¨ Return to work or school. °¨ Do physical activities or play sports. °¨ Have sex. °· Do not drive or operate heavy machinery for 24 hours or as told by your doctor. °· Have someone with you for the first 24 hours after the procedure. °· Keep all follow-up visits as told by your doctor. This is important. °Contact a health care provider if: °· You have a fever. °· You have chills. °· You have more bleeding from the area where the tube was inserted. Hold pressure on the area. °· You have redness, swelling, or pain in the area where the tube was inserted. °· You have fluid or pus coming from the area. °Get help right away if: °· You have a lot of pain in the area where the tube was inserted. °· The area where the tube was inserted is bleeding, and the bleeding does not stop after 30 minutes of holding steady pressure on the area. °· The area near or just beyond the insertion site becomes pale, cool, tingly, or numb. °This information is not intended to replace advice given to you by your health care provider. Make sure you discuss any questions you have with your health care provider. °Document Released: 04/14/2008 Document Revised: 06/24/2015 Document Reviewed: 06/19/2012 °Elsevier Interactive Patient Education © 2017 [employer] ° °

## 2016-03-03 NOTE — Telephone Encounter (Signed)
Attempted to call pt.  Phone rang several times with no answer.  Will try again later.  Can schedule pt 04/03/16 @ 10:30A.

## 2016-03-03 NOTE — Interval H&P Note (Signed)
Cath Lab Visit (complete for each Cath Lab visit)  Clinical Evaluation Leading to the Procedure:   ACS: No.  Non-ACS:    Anginal Classification: CCS II  Anti-ischemic medical therapy: Minimal Therapy (1 class of medications)  Non-Invasive Test Results: No non-invasive testing performed  Prior CABG: No previous CABG      History and Physical Interval Note:  03/03/2016 7:09 AM  Erin Morrison  has presented today for surgery, with the diagnosis of n stemi  The various methods of treatment have been discussed with the patient and family. After consideration of risks, benefits and other options for treatment, the patient has consented to  Procedure(s): Right/Left Heart Cath and Coronary Angiography (N/A) as a surgical intervention .  The patient's history has been reviewed, patient examined, no change in status, stable for surgery.  I have reviewed the patient's chart and labs.  Questions were answered to the patient's satisfaction.     Lyn RecordsHenry W Kandice Schmelter III

## 2016-03-03 NOTE — Telephone Encounter (Signed)
-----   Message from Lyn RecordsHenry W Smith, MD sent at 03/03/2016  8:56 AM EST ----- She needs a post-cath follow-up in 3-4 weeks for medication adjustment.

## 2016-03-03 NOTE — CV Procedure (Signed)
   Right and left heart cath performed via right antecubital and radial.  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  Large widely patent circumflex  Dominant and widely patent RCA  Normal LV function and hemodynamics.  Normal right heart hemodynamics.

## 2016-03-06 ENCOUNTER — Other Ambulatory Visit: Payer: Self-pay | Admitting: Pulmonary Disease

## 2016-03-06 DIAGNOSIS — J449 Chronic obstructive pulmonary disease, unspecified: Secondary | ICD-10-CM

## 2016-03-07 NOTE — Telephone Encounter (Signed)
Attempted to call pt. Phone rang several times with no answer and no VM option.  Will try again later.

## 2016-03-08 NOTE — Telephone Encounter (Signed)
Spoke with pt and scheduled her to see Dr. Katrinka BlazingSmith on 04/05/16.

## 2016-03-23 ENCOUNTER — Ambulatory Visit (INDEPENDENT_AMBULATORY_CARE_PROVIDER_SITE_OTHER): Payer: Managed Care, Other (non HMO) | Admitting: Pulmonary Disease

## 2016-03-23 ENCOUNTER — Encounter: Payer: Self-pay | Admitting: Pulmonary Disease

## 2016-03-23 VITALS — BP 124/76 | HR 75 | Ht 62.0 in | Wt 174.4 lb

## 2016-03-23 DIAGNOSIS — J449 Chronic obstructive pulmonary disease, unspecified: Secondary | ICD-10-CM

## 2016-03-23 DIAGNOSIS — R0602 Shortness of breath: Secondary | ICD-10-CM

## 2016-03-23 DIAGNOSIS — Z7952 Long term (current) use of systemic steroids: Secondary | ICD-10-CM

## 2016-03-23 DIAGNOSIS — J309 Allergic rhinitis, unspecified: Secondary | ICD-10-CM

## 2016-03-23 LAB — PULMONARY FUNCTION TEST
FEF 25-75 PRE: 0.62 L/s
FEF 25-75 Post: 1.3 L/sec
FEF2575-%CHANGE-POST: 109 %
FEF2575-%PRED-POST: 68 %
FEF2575-%PRED-PRE: 32 %
FEV1-%Change-Post: 21 %
FEV1-%PRED-POST: 69 %
FEV1-%Pred-Pre: 57 %
FEV1-Post: 1.49 L
FEV1-Pre: 1.23 L
FEV1FVC-%Change-Post: 0 %
FEV1FVC-%Pred-Pre: 85 %
FEV6-%CHANGE-POST: 18 %
FEV6-%Pred-Post: 82 %
FEV6-%Pred-Pre: 70 %
FEV6-PRE: 1.9 L
FEV6-Post: 2.24 L
FEV6FVC-%Change-Post: -1 %
FEV6FVC-%PRED-PRE: 104 %
FEV6FVC-%Pred-Post: 102 %
FVC-%Change-Post: 19 %
FVC-%Pred-Post: 80 %
FVC-%Pred-Pre: 67 %
FVC-Post: 2.28 L
FVC-Pre: 1.9 L
POST FEV1/FVC RATIO: 66 %
Post FEV6/FVC ratio: 98 %
Pre FEV1/FVC ratio: 65 %
Pre FEV6/FVC Ratio: 100 %

## 2016-03-23 NOTE — Progress Notes (Signed)
Test reviewed.  

## 2016-03-23 NOTE — Progress Notes (Signed)
Allergies percent   Subjective:    Patient ID: Erin Morrison, female    DOB: 12/31/1950, 66 y.o.   MRN: [id number]  C.C.:  Follow-up for Moderate COPD, Acute Hypoxic Respiratory Failure, Chronic Steroid Therapy, & Chronic Allergic Rhinitis.  HPI  Moderate COPD: Previously prescribed Symbicort and Spiriva. After last hospitalization patient was switched to Perforomist and Pulmicort nebulized twice daily. Patient was restarted on Spiriva RRespimat technologist from her previous strictures I don't know what his lack of abnormality could continued on Daliresp at last appointment. Reports she is compliant with her regimen. She reports she uses her rescue medication 1-2 times per day with good effect. No exacerbations since last appointment. She continues to have an intermittent cough. No wheezing. She reports her dyspnea on exertion is unchanged.   Acute Hypoxic Respiratory Failure:  Prescribed oxygen at 2 L/m at rest and 4 L/m with exertion after prior hospitalization. Patient demonstrated no oxygen requirement today.  Chronic Steroid Therapy:  Prescribed prednisone 10 mg daily. She has been on prednisone since September 2016. Continuing ot be compliant.   Chronic Allergic Rhinitis: Regimen currently includes Singulair and Zyrtec. She continues to have significant sinus congestion & pressure. She reports decreased hearing.   Review of Systems She reports intermittent chest "tightness" that is relieved with her nebulizer medications. No recent chest pain. No fever, chills, or sweats. No abdominal pain or emesis but does have some mild nausea.   No Known Allergies  Current Outpatient Prescriptions on File Prior to Visit  Medication Sig Dispense Refill  . acetaminophen (TYLENOL) 500 MG tablet Take 500 mg by mouth 2 (two) times daily as needed for moderate pain or headache.    . albuterol (PROVENTIL HFA;VENTOLIN HFA) 108 (90 Base) MCG/ACT inhaler Inhale 2 puffs into the lungs every 6 (six) hours as  needed for wheezing or shortness of breath.     Marland Kitchen albuterol (PROVENTIL) (2.5 MG/3ML) 0.083% nebulizer solution Take 2.5 mg by nebulization every 6 (six) hours as needed for wheezing or shortness of breath.    Marland Kitchen aspirin 81 MG tablet Take 81 mg by mouth daily.    Marland Kitchen atorvastatin (LIPITOR) 40 MG tablet Take 1 tablet (40 mg total) by mouth daily at 6 PM. 90 tablet 3  . budesonide (PULMICORT) 0.5 MG/2ML nebulizer solution USE 1 VIAL VIA NEBULIZER 2 TIMES DAILY -DX CODE J44.9 120 mL 3  . cetirizine (ZYRTEC) 10 MG tablet Take 10 mg by mouth daily.    Marland Kitchen esomeprazole (NEXIUM) 40 MG capsule Take 40 mg by mouth daily as needed (for acid reflex).     . isosorbide mononitrate (IMDUR) 30 MG 24 hr tablet Take 1 tablet (30 mg total) by mouth daily. 90 tablet 3  . montelukast (SINGULAIR) 10 MG tablet Take 1 tablet (10 mg total) by mouth at bedtime. 30 tablet 3  . PERFOROMIST 20 MCG/2ML nebulizer solution USE 1 VIAL VIA NEBULIZER TWICE A DAY 120 mL 3  . Polyethyl Glycol-Propyl Glycol (SYSTANE OP) Apply 1-2 drops to eye daily as needed (dry eyes).    . predniSONE (DELTASONE) 10 MG tablet TAKE 1 TABLET BY MOUTH DAILY WITH BREAKFAST. 30 tablet 1  . roflumilast (DALIRESP) 500 MCG TABS tablet Take 1 tablet (500 mcg total) by mouth daily. 30 tablet 4  . sodium chloride (OCEAN) 0.65 % SOLN nasal spray Place 1 spray into both nostrils as needed for congestion.    Marland Kitchen Spacer/Aero-Holding Chambers (AEROCHAMBER MV) inhaler Use as instructed 1 each 0  . Tiotropium  Bromide Monohydrate (SPIRIVA RESPIMAT) 2.5 MCG/ACT AERS Inhale 2 puffs into the lungs daily. 1 Inhaler 5   No current facility-administered medications on file prior to visit.     Past Medical History:  Diagnosis Date  . Acute DVT (deep venous thrombosis) (HCC)   . Allergic rhinitis   . Atrophic vaginitis   . COPD (chronic obstructive pulmonary disease) (HCC)   . Hypertension   . Low back pain   . Umbilical hernia     Past Surgical History:  Procedure  Laterality Date  . arthroscopic knee surgery Right   . CARDIAC CATHETERIZATION N/A 03/03/2016   Procedure: Right/Left Heart Cath and Coronary Angiography;  Surgeon: Lyn Records, MD;  Location: [facility] INVASIVE CV LAB;  Service: Cardiovascular;  Laterality: N/A;  . TUBAL LIGATION      Family History  Problem Relation Age of Onset  . Emphysema Mother   . Cirrhosis Father     Social History   Social History  . Marital status: Married    Spouse name: N/A  . Number of children: N/A  . Years of education: N/A   Occupational History  . Retired    Social History Main Topics  . Smoking status: Former Smoker    Packs/day: 1.50    Years: 46.00    Types: Cigarettes    Start date: 01/31/1964    Quit date: 01/30/2010  . Smokeless tobacco: Never Used  . Alcohol use No  . Drug use: No  . Sexual activity: No   Other Topics Concern  . None   Social History Narrative   Adult nurse Pulmonary:   Originally from [place]. Moved to [place] in 1986. Has traveled to [facility]. 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 Has an outside dog. No bird or mold exposure. She does have a musty smell in her bedroom.       Objective:   Physical Exam BP 124/76 (BP Location: Left Arm, Patient Position: Sitting, Cuff Size: Large)   Pulse 75   Ht 5\' 2"  (1.575 m)   Wt 174 lb 6.4 oz (79.1 kg)   SpO2 93%   BMI 31.90 kg/m  Gen.: Mild central obesity. No distress. Awake. Integument: Warm and dry. No rash on exposed skin. HEENT: Minimal bilateral nasal turbinate swelling. No oral ulcers. Moist mucous membranes. Cardiovascular: Regular rate. No edema. No appreciable JVD. Pulmonary: Good aeration. Clear with auscultation. No accessory muscle use on room air. Abdomen: Soft. Protuberant. Nontender.  PFT 03/23/16: FVC 1.90 L (67%) FEV1 1.23 L (87%) FEV1/FVC 0.65 FEF 25-75 0.16 L (32%) positive bronchodilator response 07/13/15: FVC 2.09 L (73%) FEV1 1.45 L (66%) FEV1/FVC 0.69 FEF 25-75  0.92 L (47%) positive bronchodilator response TLC 5.56 liters (117%) RV 153% ERV 37% DLCO uncorrected 62%  03/23/16:  Walked 378 meters / Baseline Sat 95% opn RA / Nadir Sat 95% on RA 01/13/16:  Walked 3 laps / Baseline Sat 98% on RA/ Nadir Sat 95% on RA 07/21/15: Walked 372 meters / Baseline Sat 94% on RA / Nadir Sat 94% on RA  IMAGING CXR PA/LAT 02/22/15 (per radiologist): Bibasilar scarring or atelectasis. Heart normal in size. No effusion or bony abnormality.  CXR PA/LAT 02/25/11 (per radiologist): Lungs are clear with cardiac silhouette & visualized bony skeleton unremarkable. No evidence of acute cardiopulmonary disease.   CARDIAC EKG 06/08/15 (previously reviewed by me): Normal sinus rhythm. QTC 406 ms. Upright T waves & no ST segment depression to suggest  ischemia.  MICROBIOLOGY Sputum Ctx (07/22/15):  Oral Flora / Fungus Negative / AFB Negative   LABS 06/08/15 Tropoinin I: 0.00 IgG: 648 IgA: 248 IgM: 126 IgE: 15 RAST Panel: Negative Alpha-1 antitrypsin: MM (169) CBC: 11.9/16.0/47.4/348  02/26/11 CBC: 8.7/15.8/47.2/238 BMP: 146/3.7/112/23/12/0.58/137/9.3    Assessment & Plan:  66 y.o. female with moderate COPD, acute hypoxic respiratory failure, chronic steroid therapy, & chronic allergic rhinitis. Patient continues to have symptoms from her underlying COPD. She reports adherence to her regimen but her spirometry today does show significant worsening with a significant bronchodilator response. Given her good symptomatic control and lack of exacerbations I do not feel that changing her nebulizer regimen would be of great benefit to the patient. We did discuss her continued symptoms from her chronic allergic rhinitis. I believe that adding intranasal steroid therapy would be of some symptomatic relief. The patient's 6 minute walk test today shows significantly improved exercise capability and no evidence of significant desaturation that would require oxygen therapy. I  instructed the patient contact my office if he had any new breathing problems or questions before her next appointment.  1. Moderate COPD:Continuing patient on Spiriva Respimat along with Pulmicort & Perforomist nebulized twice daily. No changes. 2. Acute hypoxic respiratory failure:  Resolved.  3. Chronic steroid therapy: Continuing on prednisone 10 mg by mouth daily. Consider de-escalation of therapy at next appointment if she remains stable. 4. Chronic allergic rhinitis: Patient to continue Singulair and Zyrtec. Recommended restarting Flonase 1 spray each nostril 1-2 times daily. 5. Health maintenance: Status post influenza vaccine September 2017, Pneumovax January 2013, & Prevnar January 2015. 6. Follow-up: Return to clinic in 6 months or sooner if needed.  Donna ChristenJennings E. Jamison NeighborNestor, M.D. [facility] Pager:  [phone] After 3pm or if no response, call [phone] 10:10 AM 03/23/16

## 2016-03-23 NOTE — Patient Instructions (Signed)
   Try restarting your Flonase nasal spray. Use one spray in each nostril one to two times daily.  Call me if you have any breathing problems or questions before your next appointment.

## 2016-03-30 ENCOUNTER — Encounter: Payer: Self-pay | Admitting: Interventional Cardiology

## 2016-04-01 ENCOUNTER — Other Ambulatory Visit: Payer: Self-pay | Admitting: Pulmonary Disease

## 2016-04-04 ENCOUNTER — Other Ambulatory Visit: Payer: Self-pay | Admitting: Pulmonary Disease

## 2016-04-05 ENCOUNTER — Ambulatory Visit (INDEPENDENT_AMBULATORY_CARE_PROVIDER_SITE_OTHER): Payer: Managed Care, Other (non HMO) | Admitting: Interventional Cardiology

## 2016-04-05 ENCOUNTER — Encounter: Payer: Self-pay | Admitting: Interventional Cardiology

## 2016-04-05 VITALS — BP 124/72 | HR 89 | Ht 62.0 in | Wt 175.4 lb

## 2016-04-05 DIAGNOSIS — I214 Non-ST elevation (NSTEMI) myocardial infarction: Secondary | ICD-10-CM

## 2016-04-05 DIAGNOSIS — I2510 Atherosclerotic heart disease of native coronary artery without angina pectoris: Secondary | ICD-10-CM | POA: Diagnosis not present

## 2016-04-05 DIAGNOSIS — I10 Essential (primary) hypertension: Secondary | ICD-10-CM | POA: Diagnosis not present

## 2016-04-05 DIAGNOSIS — E784 Other hyperlipidemia: Secondary | ICD-10-CM | POA: Diagnosis not present

## 2016-04-05 DIAGNOSIS — E7849 Other hyperlipidemia: Secondary | ICD-10-CM

## 2016-04-05 DIAGNOSIS — J449 Chronic obstructive pulmonary disease, unspecified: Secondary | ICD-10-CM

## 2016-04-05 NOTE — Patient Instructions (Signed)
Medication Instructions:  1) DISCONTINUE Imdur  Labwork: Lipid and liver today  Testing/Procedures: None  Follow-Up: Your physician wants you to follow-up in: 1 year with Dr. Katrinka BlazingSmith.  You will receive a reminder letter in the mail two months in advance. If you don't receive a letter, please call our office to schedule the follow-up appointment.   Any Other Special Instructions Will Be Listed Below (If Applicable).     If you need a refill on your cardiac medications before your next appointment, please call your pharmacy.

## 2016-04-05 NOTE — Progress Notes (Signed)
Cardiology Office Note    Date:  04/05/2016   ID:  Erin Morrison, DOB Oct 29, 1950, MRN [id number]  PCP:  Lonie Peak, PA-C  Cardiologist: Lesleigh Noe, MD   Chief Complaint  Patient presents with  . Congestive Heart Failure    History of Present Illness:  Erin Morrison is a 66 y.o. female for follow-up of a recent clinical history of respiratory failure, pulmonary infection, pulmonary congestion related to diastolic heart failure, new regional wall motion abnormality on echocardiography, and elevated cardiac markers during the acute illness. She was felt to have underlying coronary disease and subsequently underwent coronary angiography that revealed moderate coronary obstruction but no high-grade disease.  Back today for follow-up. When in the hospital, statin therapy was started to prevent cardiac complications. She does not feel well. She gets tightness in her chest that is relieved by breathing treatments. No exertional chest tightness. Her primary is Lonie Peak.  Past Medical History:  Diagnosis Date  . Acute DVT (deep venous thrombosis) (HCC)   . Allergic rhinitis   . Atrophic vaginitis   . COPD (chronic obstructive pulmonary disease) (HCC)   . Hypertension   . Low back pain   . Umbilical hernia     Past Surgical History:  Procedure Laterality Date  . arthroscopic knee surgery Right   . CARDIAC CATHETERIZATION N/A 03/03/2016   Procedure: Right/Left Heart Cath and Coronary Angiography;  Surgeon: Lyn Records, MD;  Location: [facility] INVASIVE CV LAB;  Service: Cardiovascular;  Laterality: N/A;  . TUBAL LIGATION      Current Medications: Outpatient Medications Prior to Visit  Medication Sig Dispense Refill  . acetaminophen (TYLENOL) 500 MG tablet Take 500 mg by mouth 2 (two) times daily as needed for moderate pain or headache.    . albuterol (PROVENTIL HFA;VENTOLIN HFA) 108 (90 Base) MCG/ACT inhaler Inhale 2 puffs into the lungs every 6 (six) hours as needed for  wheezing or shortness of breath.     Marland Kitchen albuterol (PROVENTIL) (2.5 MG/3ML) 0.083% nebulizer solution Take 2.5 mg by nebulization every 6 (six) hours as needed for wheezing or shortness of breath.    Marland Kitchen aspirin 81 MG tablet Take 81 mg by mouth daily.    Marland Kitchen atorvastatin (LIPITOR) 40 MG tablet Take 1 tablet (40 mg total) by mouth daily at 6 PM. 90 tablet 3  . budesonide (PULMICORT) 0.5 MG/2ML nebulizer solution USE 1 VIAL VIA NEBULIZER 2 TIMES DAILY -DX CODE J44.9 120 mL 3  . cetirizine (ZYRTEC) 10 MG tablet Take 10 mg by mouth daily.    Marland Kitchen esomeprazole (NEXIUM) 40 MG capsule Take 40 mg by mouth daily as needed (for acid reflex).     . isosorbide mononitrate (IMDUR) 30 MG 24 hr tablet Take 1 tablet (30 mg total) by mouth daily. 90 tablet 3  . montelukast (SINGULAIR) 10 MG tablet Take 1 tablet (10 mg total) by mouth at bedtime. 30 tablet 3  . PERFOROMIST 20 MCG/2ML nebulizer solution USE 1 VIAL VIA NEBULIZER TWICE A DAY 120 mL 3  . Polyethyl Glycol-Propyl Glycol (SYSTANE OP) Apply 1-2 drops to eye daily as needed (dry eyes).    . predniSONE (DELTASONE) 10 MG tablet TAKE 1 TABLET BY MOUTH DAILY WITH BREAKFAST. 30 tablet 1  . roflumilast (DALIRESP) 500 MCG TABS tablet Take 1 tablet (500 mcg total) by mouth daily. 30 tablet 4  . Spacer/Aero-Holding Chambers (AEROCHAMBER MV) inhaler Use as instructed 1 each 0  . Tiotropium Bromide Monohydrate (SPIRIVA RESPIMAT)  2.5 MCG/ACT AERS Inhale 2 puffs into the lungs daily. 1 Inhaler 5  . isosorbide mononitrate (IMDUR) 30 MG 24 hr tablet TAKE 1 TABLET BY MOUTH EVERY DAY (Patient not taking: Reported on 04/05/2016) 30 tablet 3  . isosorbide mononitrate (IMDUR) 30 MG 24 hr tablet TAKE 1 TABLET BY MOUTH EVERY DAY (Patient not taking: Reported on 04/05/2016) 30 tablet 5  . sodium chloride (OCEAN) 0.65 % SOLN nasal spray Place 1 spray into both nostrils as needed for congestion.     No facility-administered medications prior to visit.      Allergies:   Patient has no known  allergies.   Social History   Social History  . Marital status: Married    Spouse name: N/A  . Number of children: N/A  . Years of education: N/A   Occupational History  . Retired    Social History Main Topics  . Smoking status: Former Smoker    Packs/day: 1.50    Years: 46.00    Types: Cigarettes    Start date: 01/31/1964    Quit date: 01/30/2010  . Smokeless tobacco: Never Used  . Alcohol use No  . Drug use: No  . Sexual activity: No   Other Topics Concern  . None   Social History Narrative   Adult nurse Pulmonary:   Originally from [place]. Moved to  in 1986. Has traveled to [facility]. 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 Has an outside dog. No bird or mold exposure. She does have a musty smell in her bedroom.      Family History:  The patient's family history includes Cirrhosis in her father; Emphysema in her mother.   ROS:   Please see the history of present illness.    Occasional cough. As mentioned above tightness in the chest prior to breathing treatments to resolve some with aerosol therapy.  All other systems reviewed and are negative.   PHYSICAL EXAM:   VS:  BP 124/72 (BP Location: Right Arm)   Pulse 89   Ht 5\' 2"  (1.575 m)   Wt 175 lb 6.4 oz (79.6 kg)   BMI 32.08 kg/m    GEN: Well nourished, well developed, in no acute distress  HEENT: normal  Neck: no JVD, carotid bruits, or masses Cardiac: RRR; no murmurs, rubs, or gallops,no edema  Respiratory:  clear but diminished redness sounds bilaterally, normal work of breathing. GI: soft, nontender, nondistended, + BS MS: no deformity or atrophy  Skin: warm and dry, no rash Neuro:  Alert and Oriented x 3, Strength and sensation are intact Psych: euthymic mood, full affect  Wt Readings from Last 3 Encounters:  04/05/16 175 lb 6.4 oz (79.6 kg)  03/23/16 174 lb 6.4 oz (79.1 kg)  03/03/16 173 lb (78.5 kg)      Studies/Labs Reviewed:   EKG:  EKG  Normal sinus  rhythm, no significant abnormality noted.  Recent Labs: 12/03/2015: ALT 21 12/04/2015: Magnesium 2.1; TSH 0.750 12/08/2015: Hemoglobin 15.1 02/24/2016: BUN 13; Creatinine, Ser 0.67; Platelets 384; Potassium 4.4; Sodium 142   Lipid Panel    Component Value Date/Time   CHOL 207 (H) 12/04/2015 0511   TRIG 79 12/04/2015 0511   HDL 57 12/04/2015 0511   CHOLHDL 3.6 12/04/2015 0511   VLDL 16 12/04/2015 0511   LDLCALC 134 (H) 12/04/2015 0511    Additional studies/ records that were reviewed today include:  Coronary angiography February 2018: Coronary Diagrams   Diagnostic Diagram  Normal LV function and normal filling pressures   ASSESSMENT:    1. CAD in native artery   2. Essential hypertension   3. NSTEMI (non-ST elevated myocardial infarction) (HCC)   4. Other hyperlipidemia   5. Moderate COPD (chronic obstructive pulmonary disease) (HCC)      PLAN:  In order of problems listed above:  1. Moderate coronary disease as noted above. Disease is not severe enough to require him to her. This medication is discontinued. Continue statin therapy. LDL target less than 70. Clinical follow-up 1 year. 2. 2 g sodium diet. 3. Not related to coronary artery occlusion. Likely stress related cardiac event during respiratory illness. 4. Lipid panel today and then chronic follow-up will be established thereafter. This lipid panel is nonfasting. 5. Continue to follow with pulmonary concerning her lung disease.    Medication Adjustments/Labs and Tests Ordered: Current medicines are reviewed at length with the patient today.  Concerns regarding medicines are outlined above.  Medication changes, Labs and Tests ordered today are listed in the Patient Instructions below. There are no Patient Instructions on file for this visit.   Signed, Lesleigh NoeHenry W Smith III, MD  04/05/2016 2:18 PM    [facility] [address], [place], [place]  [address] Phone: [phone]; Fax: [phone])  [phone]

## 2016-04-06 LAB — HEPATIC FUNCTION PANEL
ALBUMIN: 4.5 g/dL (ref 3.6–4.8)
ALT: 28 IU/L (ref 0–32)
AST: 16 IU/L (ref 0–40)
Alkaline Phosphatase: 77 IU/L (ref 39–117)
BILIRUBIN TOTAL: 0.8 mg/dL (ref 0.0–1.2)
BILIRUBIN, DIRECT: 0.19 mg/dL (ref 0.00–0.40)
TOTAL PROTEIN: 6.3 g/dL (ref 6.0–8.5)

## 2016-04-06 LAB — LIPID PANEL
CHOL/HDL RATIO: 2.3 ratio (ref 0.0–4.4)
Cholesterol, Total: 138 mg/dL (ref 100–199)
HDL: 60 mg/dL (ref >39)
LDL CALC: 55 mg/dL (ref 0–99)
TRIGLYCERIDES: 115 mg/dL (ref 0–149)
VLDL Cholesterol Cal: 23 mg/dL (ref 5–40)

## 2016-05-03 ENCOUNTER — Other Ambulatory Visit: Payer: Self-pay | Admitting: Pulmonary Disease

## 2016-05-07 ENCOUNTER — Other Ambulatory Visit: Payer: Self-pay | Admitting: Adult Health

## 2016-05-22 ENCOUNTER — Telehealth: Payer: Self-pay | Admitting: Pulmonary Disease

## 2016-05-22 ENCOUNTER — Telehealth: Payer: Self-pay | Admitting: Interventional Cardiology

## 2016-05-22 MED ORDER — ISOSORBIDE MONONITRATE ER 30 MG PO TB24: 15.0000 mg | ORAL_TABLET | Freq: Every day | ORAL | Status: DC

## 2016-05-22 NOTE — Telephone Encounter (Signed)
She doesn't have a history of arrhythmia but this is certainly a concern. Her previous cath in February did show some LAD disease so I would agree with contacting her Cardiologist today for further recommendations given her elevated BP as well as her symptoms.

## 2016-05-22 NOTE — Telephone Encounter (Signed)
Spoke with the pt and notified of recs per JN She verbalized understanding and nothing further needed 

## 2016-05-22 NOTE — Telephone Encounter (Signed)
Pt c/o chest tightness, chest pain, elevated BP (150/100), heart is "skpping beats" intermittently X approx 2 months.  Denies increased SOB, mucus production, sinus congestion, fever, arm pain.   Pt cards, Dr. Katrinka Blazing.  Pt is not on any BP meds.  Takes a  asa daily.   Pt thinks this is a side effect from a medication but is unsure of which med.  I advised pt to call cardiology to make aware of s/s.  Pt also requests we send message to [facility] for further recs.    Pt uses [facility] in [place].    JN please advise on further recs.  Thanks!

## 2016-05-22 NOTE — Telephone Encounter (Signed)
Sending to DOD as this has not yet been addressed. VS please advise.  Thanks.

## 2016-05-22 NOTE — Telephone Encounter (Signed)
Reviewed with Dr. Katrinka Blazing- he does not feel like symptoms are cardiac in nature, but if the patient still has imdur 30 mg tablets at home, then it is ok to start back on 1/2 tablet to 1 full tablet a day.  I have notified the patient of Dr. Michaelle Copas recommendations. I have advised her to start out with imdur 30 mg 1/2 tablet once daily to see how she tolerates this. I have advised her if she cannot tolerate the medication but her symptoms improve, to call back and let us know. She verbalizes understanding and is agreeable.

## 2016-05-22 NOTE — Telephone Encounter (Signed)
Call transferred directly to triage. Per the patient, she has complaints of chest discomfort that occurs almost daily, intermittent rapid heart rate (she does not know how often this occurs) and flucuation of her SBP - she reports 130-150 mmHg.  She is taking several pulmonary drugs- inhalers and nebs- she cannot recall if symptoms are more noticeable after use of these drugs. Cardiac medications only consist of ASA/ atorvastatin. She was on imdur 30 mg daily until 04/05/16- this was stopped by Dr. Katrinka Blazing due to dizziness. I inquired if she feels her symptoms started at this time- she states " I think so."  She does not weigh at home as she does not have a scale- but feels she may have some abdominal fullness.   She states she has placed a call to pulmonary, but they asked that she call here as well. I advised the patient I will review with Dr. Katrinka Blazing and call her back.  She still has imdur 30 mg tablets at home.

## 2016-05-22 NOTE — Telephone Encounter (Signed)
New Message     Pt c/o of Chest Pain: STAT if CP now or developed within 24 hours  1. Are you having CP right now? no  2. Are you experiencing any other symptoms (ex. SOB, nausea, vomiting, sweating)?  Sob   3. How long have you been experiencing CP? For awhile   4. Is your CP continuous or coming and going continous  5. Have you taken Nitroglycerin? No    bp 140/86 ,  Heart is skipping beats , rapid heart beat  ?

## 2016-05-29 ENCOUNTER — Other Ambulatory Visit: Payer: Self-pay | Admitting: Pulmonary Disease

## 2016-06-04 ENCOUNTER — Other Ambulatory Visit: Payer: Self-pay | Admitting: Pulmonary Disease

## 2016-06-04 DIAGNOSIS — J449 Chronic obstructive pulmonary disease, unspecified: Secondary | ICD-10-CM

## 2016-07-17 ENCOUNTER — Other Ambulatory Visit: Payer: Self-pay | Admitting: *Deleted

## 2016-07-17 MED ORDER — ISOSORBIDE MONONITRATE ER 30 MG PO TB24: 15.0000 mg | ORAL_TABLET | Freq: Every day | ORAL | 2 refills | Status: DC

## 2016-07-17 NOTE — Telephone Encounter (Signed)
Patient called and requested a refill on the isosorbide. See phone note below where she was instructed to resume this medication. She stated that she only takes 1/2 tab qd tolerates this dose well. I will send in rx. Telephone   05/22/2016 [facility]  Lyn RecordsSmith, Henry W, MD  Cardiology   Conversation  (Newest Message First)  Jefferey PicaMcGhee, Heather C, RN    05/22/16 10:50 AM  Note    Reviewed with Dr. Katrinka BlazingSmith- he does not feel like symptoms are cardiac in nature, but if the patient still has imdur 30 mg tablets at home, then it is ok to start back on 1/2 tablet to 1 full tablet a day.  I have notified the patient of Dr. Michaelle CopasSmith's recommendations. I have advised her to start out with imdur 30 mg 1/2 tablet once daily to see how she tolerates this. I have advised her if she cannot tolerate the medication but her symptoms improve, to call back and let us know. She verbalizes understanding and is agreeable.     Jefferey PicaMcGhee, Heather C, RN    05/22/16 10:15 AM  Note    Call transferred directly to triage. Per the patient, she has complaints of chest discomfort that occurs almost daily, intermittent rapid heart rate (she does not know how often this occurs) and flucuation of her SBP - she reports 130-150 mmHg.  She is taking several pulmonary drugs- inhalers and nebs- she cannot recall if symptoms are more noticeable after use of these drugs. Cardiac medications only consist of ASA/ atorvastatin. She was on imdur 30 mg daily until 04/05/16- this was stopped by Dr. Katrinka BlazingSmith due to dizziness. I inquired if she feels her symptoms started at this time- she states " I think so."  She does not weigh at home as she does not have a scale- but feels she may have some abdominal fullness.   She states she has placed a call to pulmonary, but they asked that she call here as well. I advised the patient I will review with Dr. Katrinka BlazingSmith and call her back.  She still has imdur 30 mg tablets at home.          05/22/16 10:06 AM  Berle MullMiller, Norma J routed this conversation to Julio SicksBowers, Jennifer L, LPN . [facility] Triage  Berle MullMiller, Norma J    05/22/16 10:03 AM  Note    New Message     Pt c/o of Chest Pain: STAT if CP now or developed within 24 hours  1. Are you having CP right now? no  2. Are you experiencing any other symptoms (ex. SOB, nausea, vomiting, sweating)?  Sob   3. How long have you been experiencing CP? For awhile   4. Is your CP continuous or coming and going continous  5. Have you taken Nitroglycerin? No    bp 140/86 ,  Heart is skipping beats , rapid heart beat  ?       05/22/16 10:03 AM    Lorra HalsSandra K. Raether contacted Berle MullMiller, Norma J

## 2016-08-23 ENCOUNTER — Other Ambulatory Visit: Payer: Self-pay | Admitting: Pulmonary Disease

## 2016-09-18 ENCOUNTER — Other Ambulatory Visit: Payer: Self-pay | Admitting: Pulmonary Disease

## 2016-10-16 ENCOUNTER — Other Ambulatory Visit: Payer: Self-pay | Admitting: Pulmonary Disease

## 2016-10-24 ENCOUNTER — Ambulatory Visit (INDEPENDENT_AMBULATORY_CARE_PROVIDER_SITE_OTHER): Payer: Managed Care, Other (non HMO) | Admitting: Pulmonary Disease

## 2016-10-24 VITALS — BP 136/76 | HR 78 | Ht 62.0 in | Wt 185.4 lb

## 2016-10-24 DIAGNOSIS — Z7952 Long term (current) use of systemic steroids: Secondary | ICD-10-CM | POA: Diagnosis not present

## 2016-10-24 DIAGNOSIS — K219 Gastro-esophageal reflux disease without esophagitis: Secondary | ICD-10-CM | POA: Diagnosis not present

## 2016-10-24 DIAGNOSIS — J449 Chronic obstructive pulmonary disease, unspecified: Secondary | ICD-10-CM

## 2016-10-24 DIAGNOSIS — J309 Allergic rhinitis, unspecified: Secondary | ICD-10-CM

## 2016-10-24 DIAGNOSIS — Z23 Encounter for immunization: Secondary | ICD-10-CM

## 2016-10-24 HISTORY — DX: Gastro-esophageal reflux disease without esophagitis: K21.9

## 2016-10-24 MED ORDER — GLYCOPYRROLATE 25 MCG/ML IN SOLN: 25.0000 ug | Freq: Two times a day (BID) | RESPIRATORY_TRACT | 3 refills | Status: DC

## 2016-10-24 NOTE — Addendum Note (Signed)
Addended by: Etheleen Mayhew C on: 10/24/2016 05:06 PM   Modules accepted: Orders

## 2016-10-24 NOTE — Progress Notes (Signed)
Allergies percent   Subjective:    Patient ID: Erin Morrison, female    DOB: 07/21/1950, 66 y.o.   MRN: [id number]  C.C.:  Follow-up for Moderate COPD, Chronic Steroid Therapy, & Chronic Allergic Rhinitis.  HPI  Moderate COPD: Previously switched from Symbicort to Perforomist and Pulmicort twice daily. Patient also on Spiriva Respimat. She feels her dyspnea is slowly worsening. Denies any coughing or wheezing. Reports she is compliant with her regimen. She is using her albuterol in her nebulizer and rescue inhaler up to 4 times daily and does feel it helps  For a short period. She does get up in the middle of the night to use it as well. She is also adherent with her Daliresp. No exacerbations since last appointment.   Chronic steroid therapy: Prescribed prednisone 10 mg daily. Has been on prednisone since September 2016. No steroid bursts since last appointment.   Chronic allergic rhinitis: Patient prescribed Singulair and Zyrtec. No sinus congestion or drainage. She isn't needing to use her Flonase.   Review of Systems She reports she is continuing to have intermittent chest "tightness" and pain in her mid-sternum. She denies any radiation to her chest discomfort. She reports she does have occasional reflux & dyspepsia at times. She does have some nausea occasionally. She denies any fever or chills. No sweats.   No Known Allergies  Current Outpatient Prescriptions on File Prior to Visit  Medication Sig Dispense Refill  . acetaminophen (TYLENOL) 500 MG tablet Take 500 mg by mouth 2 (two) times daily as needed for moderate pain or headache.    . albuterol (PROVENTIL HFA;VENTOLIN HFA) 108 (90 Base) MCG/ACT inhaler Inhale 2 puffs into the lungs every 6 (six) hours as needed for wheezing or shortness of breath.     Marland Kitchen albuterol (PROVENTIL) (2.5 MG/3ML) 0.083% nebulizer solution Take 2.5 mg by nebulization every 6 (six) hours as needed for wheezing or shortness of breath.    Marland Kitchen aspirin 81 MG  tablet Take 81 mg by mouth daily.    Marland Kitchen atorvastatin (LIPITOR) 40 MG tablet Take 1 tablet (40 mg total) by mouth daily at 6 PM. 90 tablet 3  . budesonide (PULMICORT) 0.5 MG/2ML nebulizer solution USE 1 VIAL VIA NEBULIZER 2 TIMES DAILY -DX CODE J44.9 120 mL 3  . cetirizine (ZYRTEC) 10 MG tablet Take 10 mg by mouth daily.    Marland Kitchen DALIRESP 500 MCG TABS tablet TAKE 1 TABLET BY MOUTH DAILY. 30 tablet 3  . esomeprazole (NEXIUM) 40 MG capsule Take 40 mg by mouth daily as needed (for acid reflex).     . fluticasone (FLONASE) 50 MCG/ACT nasal spray Place 1 spray into both nostrils daily as needed for allergies or rhinitis.    Marland Kitchen montelukast (SINGULAIR) 10 MG tablet TAKE 1 TABLET BY MOUTH AT BEDTIME 30 tablet 3  . PERFOROMIST 20 MCG/2ML nebulizer solution USE 1 VIAL VIA NEBULIZER TWICE A DAY 120 mL 3  . Polyethyl Glycol-Propyl Glycol (SYSTANE OP) Apply 1-2 drops to eye daily as needed (dry eyes).    . predniSONE (DELTASONE) 10 MG tablet TAKE 1 TABLET BY MOUTH DAILY WITH BREAKFAST. 30 tablet 1  . Spacer/Aero-Holding Chambers (AEROCHAMBER MV) inhaler Use as instructed 1 each 0  . SPIRIVA RESPIMAT 2.5 MCG/ACT AERS INHALE 2 PUFFS BY MOUTH INTO THE LUNGS DAILY. 1 Inhaler 6  . isosorbide mononitrate (IMDUR) 30 MG 24 hr tablet Take 0.5 tablets (15 mg total) by mouth daily. 45 tablet 2   No current facility-administered medications on  file prior to visit.     Past Medical History:  Diagnosis Date  . Acute DVT (deep venous thrombosis) (HCC)   . Allergic rhinitis   . Atrophic vaginitis   . COPD (chronic obstructive pulmonary disease) (HCC)   . Hypertension   . Low back pain   . Umbilical hernia     Past Surgical History:  Procedure Laterality Date  . arthroscopic knee surgery Right   . CARDIAC CATHETERIZATION N/A 03/03/2016   Procedure: Right/Left Heart Cath and Coronary Angiography;  Surgeon: Lyn Records, MD;  Location: [facility] INVASIVE CV LAB;  Service: Cardiovascular;  Laterality: N/A;  . TUBAL LIGATION       Family History  Problem Relation Age of Onset  . Emphysema Mother   . Cirrhosis Father     Social History   Social History  . Marital status: Married    Spouse name: N/A  . Number of children: N/A  . Years of education: N/A   Occupational History  . Retired    Social History Main Topics  . Smoking status: Former Smoker    Packs/day: 1.50    Years: 46.00    Types: Cigarettes    Start date: 01/31/1964    Quit date: 01/30/2010  . Smokeless tobacco: Never Used  . Alcohol use No  . Drug use: No  . Sexual activity: No   Other Topics Concern  . Not on file   Social History Narrative   [facility]:   Originally from [place]. Moved to [place] in 1986. Has traveled to [facility]. 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 Has an outside dog. No bird or mold exposure. She does have a musty smell in her bedroom.       Objective:   Physical Exam BP 136/76   Pulse 78   Ht  (1.575 m)   Wt 185 lb 6 oz (84.1 kg)   SpO2 92%   BMI 33.91 kg/m   General:  Awake. Accompanied by daughter today. No distress. Integument:  Warm & dry. No rash on exposed skin. No bruising on exposed skin. Extremities:  No cyanosis or clubbing.  HEENT:  Moist mucus membranes. Minimal nasal turbinate swelling. No oral ulcers. Cardiovascular:  Regular rate. No edema. Regular rhythm.  Pulmonary:  Prolonged exhalation phase. Faint wheeze at end of exhalation. Good aeration with normal work of breathing on room air. Abdomen: Soft. Normal bowel sounds. Protuberant. Musculoskeletal:  Normal bulk and tone. No joint deformity or effusion appreciated.  PFT 03/23/16: FVC 1.90 L (67%) FEV1 1.23 L (87%) FEV1/FVC 0.65 FEF 25-75 0.16 L (32%) positive bronchodilator response 07/13/15: FVC 2.09 L (73%) FEV1 1.45 L (66%) FEV1/FVC 0.69 FEF 25-75 0.92 L (47%) positive bronchodilator response TLC 5.56 liters (117%) RV 153% ERV 37% DLCO uncorrected 62%  03/23/16:  Walked 378  meters / Baseline Sat 95% opn RA / Nadir Sat 95% on RA 01/13/16:  Walked 3 laps / Baseline Sat 98% on RA/ Nadir Sat 95% on RA 07/21/15: Walked 372 meters / Baseline Sat 94% on RA / Nadir Sat 94% on RA  IMAGING CXR PA/LAT 02/22/15 (per radiologist): Bibasilar scarring or atelectasis. Heart normal in size. No effusion or bony abnormality.  CXR PA/LAT 02/25/11 (per radiologist): Lungs are clear with cardiac silhouette & visualized bony skeleton unremarkable. No evidence of acute cardiopulmonary disease.   CARDIAC EKG 06/08/15 (previously reviewed by me): Normal sinus rhythm. QTC 406 ms. Upright T waves &  no ST segment depression to suggest ischemia.  MICROBIOLOGY Sputum Ctx (07/22/15):  Oral Flora / Fungus Negative / AFB Negative   LABS 06/08/15 Tropoinin I: 0.00 IgG: 648 IgA: 248 IgM: 126 IgE: 15 RAST Panel: Negative Alpha-1 antitrypsin: MM (169) CBC: 11.9/16.0/47.4/348  02/26/11 CBC: 8.7/15.8/47.2/238 BMP: 146/3.7/112/23/12/0.58/137/9.3    Assessment & Plan:  66 y.o. female with moderate COPD and chronic allergic rhinitis on chronic prednisone therapy. With patient's worsening dyspnea I question whether or not she has adequate control of her COPD on her current regimen. Alternatively, with her increased reflux we could be seeing the effects of silent laryngo-esophageal reflux. Either way, I am adjusting her medication regimen to treat both of these possibilities. Certainly her weight gain could be contributing as well. We had a lengthy discussion regarding the beneficial aspects of pulmonary rehabilitation and the patient is willing to try pulmonary rehabilitation. I instructed the patient to contact our office if she had any new breathing problems or questions before her next appointment.  1. Moderate COPD: Continuing Pulmicort and Perforomist nebulized twice daily. Switching from Spiriva Respimat to [employer]. Could consider the upcoming nebulized LAMA that will be released in  November if [place] Charon is ineffective. Referring to pulmonary rehabilitation.  2. Chronic prednisone therapy: Continuing prednisone 10 mg daily. Holding on taper given poor pulmonary symptom control. 3. GERD: Patient advised to try Zantac 150 mg at night before bed. 4. Chronic allergic rhinitis: Continuing Singulair and Zyrtec. No changes. 5. Health maintenance: Status post Pneumovax January 2013 & Prevnar January 2015. Administering high-dose influenza vaccine today. 6. Follow-up: Return to clinic in 3 months or sooner if needed.  Donna Christen Jamison Neighbor, M.D. [facility] Pager:  [phone] After 3pm or if no response, call [phone] 4:24 PM 10/24/16

## 2016-10-24 NOTE — Addendum Note (Signed)
Addended by: Etheleen Mayhew C on: 10/24/2016 05:08 PM   Modules accepted: Orders

## 2016-10-24 NOTE — Patient Instructions (Addendum)
   Start taking Zantac  at night before bed. You can take the generic form called Ranitidine if you prefer.  Keep taking your Pulmicort and Perforomist as prescribed.  Try the new medication we are starting today, Legrand Rams, in place of your Spiriva.  Call our office if you have any questions or concerns.   We will see you back in 3 months or sooner if needed.

## 2016-10-25 ENCOUNTER — Telehealth: Payer: Self-pay | Admitting: Pulmonary Disease

## 2016-10-25 ENCOUNTER — Other Ambulatory Visit: Payer: Self-pay | Admitting: Pulmonary Disease

## 2016-10-25 ENCOUNTER — Telehealth: Payer: Self-pay

## 2016-10-25 DIAGNOSIS — J449 Chronic obstructive pulmonary disease, unspecified: Secondary | ICD-10-CM

## 2016-10-25 NOTE — Telephone Encounter (Signed)
Called and spoke with Amy- she states that pt is wanting to get her lonhala magnair from [facility]  I advised we have to send med to [facility]  She is asking when pt will receive med  I called [facility] and they advised that the med will be shipped tomorrow 10/26/16  Spoke with Amy and notified of recs  She verbalized understanding and nothing further needed

## 2016-10-25 NOTE — Telephone Encounter (Signed)
I have sent an order for Erin Morrison to [facility]. The patients last OV as well as the demographics have been sent to the correct fax number. Will follow up with this if the patient does not hear anything.

## 2016-10-27 ENCOUNTER — Telehealth: Payer: Self-pay | Admitting: Pulmonary Disease

## 2016-10-27 MED ORDER — GLYCOPYRROLATE 25 MCG/ML IN SOLN: 25.0000 ug | Freq: Two times a day (BID) | RESPIRATORY_TRACT | 6 refills | Status: DC

## 2016-10-27 NOTE — Telephone Encounter (Signed)
Called and spoke with pt and she is aware of refill that has been sent to the local pharmacy for the lonhala.

## 2016-10-31 ENCOUNTER — Other Ambulatory Visit: Payer: Self-pay | Admitting: Pulmonary Disease

## 2016-10-31 DIAGNOSIS — J449 Chronic obstructive pulmonary disease, unspecified: Secondary | ICD-10-CM

## 2016-11-10 ENCOUNTER — Telehealth (HOSPITAL_COMMUNITY): Payer: Self-pay

## 2016-11-10 ENCOUNTER — Ambulatory Visit (HOSPITAL_COMMUNITY): Payer: Managed Care, Other (non HMO)

## 2016-11-10 NOTE — Telephone Encounter (Signed)
Patient called to reschedule orientation due to the weather. Scheduled new orientation for 11/27/16 @ 9:15am.

## 2016-11-17 ENCOUNTER — Other Ambulatory Visit: Payer: Self-pay | Admitting: Pulmonary Disease

## 2016-11-22 ENCOUNTER — Telehealth: Payer: Self-pay | Admitting: Interventional Cardiology

## 2016-11-22 ENCOUNTER — Other Ambulatory Visit: Payer: Self-pay | Admitting: *Deleted

## 2016-11-22 DIAGNOSIS — E7849 Other hyperlipidemia: Secondary | ICD-10-CM

## 2016-11-22 NOTE — Telephone Encounter (Signed)
Called Pt to schedule follow up fasting labs. While speaking with pt she mentioned that she has been having palps more often, particularly when she is exerting.  Denies CP or dizziness.  No vitals available.  Pt concerned and would like to be seen.  Scheduled pt to see Erin BoozerLaura Ingold, NP 10/29 at 11AM.  Pt has orientation that morning for pulm rehab but states it is only an hour to an hour and a half.  Pt will come here straight from orientation.  Pt aware to come fasting so that we can do labs while she is here.  Pt appreciative for call.

## 2016-11-26 NOTE — Progress Notes (Signed)
Cardiology Office Note   Date:  11/27/2016   ID:  Erin Morrison, DOB May 26, 1950, MRN [id number]  PCP:  Cyndi Bender, PA-C  Cardiologist:  Dr. Tamala Julian     Chief Complaint  Patient presents with  . Chest Pain      History of Present Illness: Erin Morrison is a 66 y.o. female who presents for chest pain.   She has a hx of diastolic heart failure, new regional wall motion abnormality on echocardiography, and elevated cardiac markers during the acute illness. She was felt to have underlying coronary disease and subsequently underwent coronary angiography that revealed moderate coronary obstruction but no high-grade disease.  HLD, COPD with chronic steroid therapy.  She is to begin pulmonary rehab.   Yesterday with call she complained of chest pain and she wanted to be seen.  The pain began after getting up.  No associated symptoms.  It was constant 4/10 all day. She could not tell if her lung disease, GI or cardiac.  She was able to go to bed without problems and no pain today.  She went to pulmonary rehab.  Currently no pain and no change in chronic SOB.      Past Medical History:  Diagnosis Date  . Acute DVT (deep venous thrombosis) ([facility])   . Allergic rhinitis   . Atrophic vaginitis   . COPD (chronic obstructive pulmonary disease) ([facility])   . Hypertension   . Low back pain   . Umbilical hernia     Past Surgical History:  Procedure Laterality Date  . arthroscopic knee surgery Right   . CARDIAC CATHETERIZATION N/A 03/03/2016   Procedure: Right/Left Heart Cath and Coronary Angiography;  Surgeon: Belva Crome, MD;  Location: [facility];  Service: Cardiovascular;  Laterality: N/A;  . TUBAL LIGATION       Current Outpatient Prescriptions  Medication Sig Dispense Refill  . acetaminophen (TYLENOL) 500 MG tablet Take 500 mg by mouth 2 (two) times daily as needed for moderate pain or headache.    . albuterol (PROVENTIL HFA;VENTOLIN HFA) 108 (90 Base) MCG/ACT inhaler  Inhale 2 puffs into the lungs every 6 (six) hours as needed for wheezing or shortness of breath.     Marland Kitchen albuterol (PROVENTIL) (2.5 MG/3ML) 0.083% nebulizer solution Take 2.5 mg by nebulization every 6 (six) hours as needed for wheezing or shortness of breath.    Marland Kitchen aspirin 81 MG tablet Take 81 mg by mouth daily.    Marland Kitchen atorvastatin (LIPITOR) 40 MG tablet Take 1 tablet (40 mg total) by mouth daily at 6 PM. 90 tablet 3  . budesonide (PULMICORT) 0.5 MG/2ML nebulizer solution USE 1 VIAL VIA NEBULIZER 2 TIMES DAILY -DX CODE J44.9 120 mL 3  . cetirizine (ZYRTEC) 10 MG tablet Take 10 mg by mouth daily.    Marland Kitchen DALIRESP 500 MCG TABS tablet TAKE 1 TABLET BY MOUTH DAILY. 30 tablet 3  . esomeprazole (NEXIUM) 40 MG capsule Take 40 mg by mouth daily as needed (for acid reflex).     . Glycopyrrolate (LONHALA MAGNAIR STARTER KIT) 25 MCG/ML SOLN Inhale 25 mcg into the lungs 2 (two) times daily. 1 mL 6  . isosorbide mononitrate (IMDUR) 30 MG 24 hr tablet Take 0.5 tablets (15 mg total) by mouth daily. 45 tablet 2  . montelukast (SINGULAIR) 10 MG tablet TAKE 1 TABLET BY MOUTH AT BEDTIME 30 tablet 3  . PERFOROMIST 20 MCG/2ML nebulizer solution USE 1 VIAL VIA NEBULIZER TWICE A DAY 120 mL  3  . Polyethyl Glycol-Propyl Glycol (SYSTANE OP) Apply 1-2 drops to eye daily as needed (dry eyes).    . predniSONE (DELTASONE) 10 MG tablet TAKE 1 TABLET BY MOUTH DAILY WITH BREAKFAST. 30 tablet 1  . Spacer/Aero-Holding Chambers (AEROCHAMBER MV) inhaler Use as instructed 1 each 0  . nitroGLYCERIN (NITROSTAT) 0.4 MG SL tablet Place 1 tablet (0.4 mg total) under the tongue every 5 (five) minutes as needed for chest pain. 90 tablet 3   No current facility-administered medications for this visit.     Allergies:   Patient has no known allergies.    Social History:  The patient  reports that she quit smoking about 6 years ago. Her smoking use included Cigarettes. She started smoking about 52 years ago. She has a 69.00 pack-year smoking  history. She has never used smokeless tobacco. She reports that she does not drink alcohol or use drugs.   Family History:  The patient's family history includes Cirrhosis in her father; Emphysema in her mother.    ROS:  General:no colds or fevers, no weight changes Skin:no rashes or ulcers HEENT:no blurred vision, no congestion CV:see HPI PUL:see HPI GI:no diarrhea constipation or melena, no indigestion GU:no hematuria, no dysuria MS:no joint pain, no claudication Neuro:no syncope, no lightheadedness Endo:no diabetes, no thyroid disease  Wt Readings from Last 3 Encounters:  11/27/16 184 lb 12.8 oz (83.8 kg)  11/27/16 184 lb 15.5 oz (83.9 kg)  10/24/16 185 lb 6 oz (84.1 kg)     PHYSICAL EXAM: VS:  BP 122/70 (BP Location: Right Arm)   Pulse 87   Ht _0  (1.575 m)   Wt 184 lb 12.8 oz (83.8 kg)   BMI 33.80 kg/m  , BMI Body mass index is 33.8 kg/m. General:Pleasant affect, NAD Skin:Warm and dry, brisk capillary refill HEENT:normocephalic, sclera clear, mucus membranes moist Neck:supple, no JVD, no bruits  Heart:S1S2 RRR without murmur, gallup, rub or click Lungs:clear without rales, rhonchi, or wheezes GLO:VFIE, non tender, + BS, do not palpate liver spleen or masses Ext:no lower ext edema, 2+ pedal pulses, 2+ radial pulses Neuro:alert and oriented X 3, MAE, follows commands, + facial symmetry    EKG:  EKG is ordered today. The ekg ordered today demonstrates SR with sinus arrhthymias but no acute changes.     Recent Labs: 12/04/2015: Magnesium 2.1; TSH 0.750 02/24/2016: BUN 13; Creatinine, Ser 0.67; Hemoglobin 14.4; Platelets 384; Potassium 4.4; Sodium 142 11/27/2016: ALT 22    Lipid Panel    Component Value Date/Time   CHOL 124 11/27/2016 1207   TRIG 83 11/27/2016 1207   HDL 55 11/27/2016 1207   CHOLHDL 2.3 11/27/2016 1207   CHOLHDL 3.6 12/04/2015 0511   VLDL 16 12/04/2015 0511   LDLCALC 52 11/27/2016 1207       Other studies Reviewed: Additional  studies/ records that were reviewed today include: .   Additional studies/ records that were reviewed today include:  Coronary angiography February 2018: Coronary Diagrams   Diagnostic Diagram      Normal LV function and normal filling pressures    ASSESSMENT AND PLAN:  1.  Chest pain, EKG without acute changes and today no recurrent pain.  She has known non obstructive CAD, will check troponin if neg will proceed with lexiscan myoview.  If troponin neg she may continue on to pulmonary rehab. She will follow up with results.  2.  CAD non obstructive with cath last year.   3.  COPD on steroids, followed by Pulmonary  4.  HTN controlled     Current medicines are reviewed with the patient today.  The patient Has no concerns regarding medicines.  The following changes have been made:  See above Labs/ tests ordered today include:see above  Disposition:   FU:  see above  Signed, Cecilie Kicks, NP  11/27/2016 10:30 PM    [address], [place], [place], [place] Phone: [phone]; Fax: [phone]

## 2016-11-27 ENCOUNTER — Encounter (HOSPITAL_COMMUNITY): Payer: Self-pay

## 2016-11-27 ENCOUNTER — Ambulatory Visit (INDEPENDENT_AMBULATORY_CARE_PROVIDER_SITE_OTHER): Payer: Managed Care, Other (non HMO) | Admitting: Cardiology

## 2016-11-27 ENCOUNTER — Encounter (HOSPITAL_COMMUNITY)
Admission: RE | Admit: 2016-11-27 | Discharge: 2016-11-27 | Disposition: A | Discharge status: Home / Self Care | Payer: Managed Care, Other (non HMO) | Source: Ambulatory Visit | Attending: Pulmonary Disease | Admitting: Pulmonary Disease

## 2016-11-27 ENCOUNTER — Encounter: Payer: Self-pay | Admitting: Cardiology

## 2016-11-27 ENCOUNTER — Encounter (INDEPENDENT_AMBULATORY_CARE_PROVIDER_SITE_OTHER): Payer: Self-pay

## 2016-11-27 ENCOUNTER — Other Ambulatory Visit: Payer: Managed Care, Other (non HMO)

## 2016-11-27 ENCOUNTER — Telehealth (HOSPITAL_COMMUNITY): Payer: Self-pay | Admitting: *Deleted

## 2016-11-27 VITALS — BP 136/73 | HR 66 | Ht 62.0 in | Wt 185.0 lb

## 2016-11-27 VITALS — BP 122/70 | HR 87 | Ht 62.0 in | Wt 184.8 lb

## 2016-11-27 DIAGNOSIS — I10 Essential (primary) hypertension: Secondary | ICD-10-CM | POA: Insufficient documentation

## 2016-11-27 DIAGNOSIS — I2510 Atherosclerotic heart disease of native coronary artery without angina pectoris: Secondary | ICD-10-CM

## 2016-11-27 DIAGNOSIS — E7849 Other hyperlipidemia: Secondary | ICD-10-CM

## 2016-11-27 DIAGNOSIS — Z79899 Other long term (current) drug therapy: Secondary | ICD-10-CM | POA: Insufficient documentation

## 2016-11-27 DIAGNOSIS — Z87891 Personal history of nicotine dependence: Secondary | ICD-10-CM | POA: Insufficient documentation

## 2016-11-27 DIAGNOSIS — R079 Chest pain, unspecified: Secondary | ICD-10-CM

## 2016-11-27 DIAGNOSIS — M545 Low back pain: Secondary | ICD-10-CM | POA: Insufficient documentation

## 2016-11-27 DIAGNOSIS — Z86718 Personal history of other venous thrombosis and embolism: Secondary | ICD-10-CM | POA: Insufficient documentation

## 2016-11-27 DIAGNOSIS — J449 Chronic obstructive pulmonary disease, unspecified: Secondary | ICD-10-CM | POA: Insufficient documentation

## 2016-11-27 LAB — LIPID PANEL
CHOLESTEROL TOTAL: 124 mg/dL (ref 100–199)
Chol/HDL Ratio: 2.3 ratio (ref 0.0–4.4)
HDL: 55 mg/dL (ref >39)
LDL CALC: 52 mg/dL (ref 0–99)
TRIGLYCERIDES: 83 mg/dL (ref 0–149)
VLDL CHOLESTEROL CAL: 17 mg/dL (ref 5–40)

## 2016-11-27 LAB — TROPONIN T: Troponin T TROPT: <0.011 ng/mL (ref <0.011)

## 2016-11-27 LAB — HEPATIC FUNCTION PANEL
ALT: 22 IU/L (ref 0–32)
AST: 17 IU/L (ref 0–40)
Albumin: 4.7 g/dL (ref 3.6–4.8)
Alkaline Phosphatase: 89 IU/L (ref 39–117)
Bilirubin Total: 1.1 mg/dL (ref 0.0–1.2)
Bilirubin, Direct: 0.27 mg/dL (ref 0.00–0.40)
Total Protein: 6.6 g/dL (ref 6.0–8.5)

## 2016-11-27 MED ORDER — NITROGLYCERIN 0.4 MG SL SUBL
0.4000 mg | SUBLINGUAL_TABLET | SUBLINGUAL | 3 refills | Status: DC | PRN
Start: 2016-11-27 — End: 2023-01-01

## 2016-11-27 NOTE — Telephone Encounter (Signed)
Patient given detailed instructions per Myocardial Perfusion Study Information Sheet for the test on 12/01/16 at 1130. Patient notified to arrive 15 minutes early and that it is imperative to arrive on time for appointment to keep from having the test rescheduled.  If you need to cancel or reschedule your appointment, please call the office within 24 hours of your appointment. . Patient verbalized understanding.[name], Adelene IdlerCynthia W

## 2016-11-27 NOTE — Patient Instructions (Signed)
Medication Instructions:  1) The proper use and anticipated side effects of nitroglycerine has been carefully explained.  If a single episode of chest pain is not relieved by one tablet, the patient will try another within 5 minutes; and if this doesn't relieve the pain, the patient is instructed to call 911 for transportation to an emergency department.   Labwork: Lipid, liver and Troponin today  Testing/Procedures: Your physician has requested that you have a lexiscan myoview. For further information please visit [email or web address]. Please follow instruction sheet, as given.   Follow-Up: Your physician recommends that you schedule a follow-up appointment in: March 2019 with Dr. Katrinka BlazingSmith.    Any Other Special Instructions Will Be Listed Below (If Applicable).     If you need a refill on your cardiac medications before your next appointment, please call your pharmacy.

## 2016-11-27 NOTE — Progress Notes (Signed)
Erin Morrison 66 y.o. female Pulmonary Rehab Orientation Note Patient arrived today in Cardiac and Pulmonary Rehab for orientation to Pulmonary Rehab. [facility] wheel chair. She does carry portable oxygen, but did not bring it in the building for orientation today, she left it in the car.  She was counselled on the need for her to bring it everywhere that it is ordered 24 hours a day and 7 days per week.  Per pt, she uses oxygen continuously. Color good, skin warm and dry. Patient is oriented to time and place. Patient's medical history, psychosocial health, and medications reviewed. Psychosocial assessment reveals pt lives with their spouse. Pt is currently retired. Pt hobbies include adult coloring books. Pt reports her stress level is moderate. Areas of stress/anxiety include Health. Her COPD worries her at times, she does not go out alone for fear of having a panic attack and difficulty breathing.   Pt does not exhibit  signs of depression. Signs of depression include sadness and no problems sleeping. PHQ2/9 score 0/1. Pt shows good  coping skills with positive outlook .  offered emotional support and reassurance. Will continue to monitor and evaluate progress toward psychosocial goal(s) of improved breathing and weight loss. Physical assessment reveals heart rate is normal, breath sounds clear to auscultation, no wheezes, rales, or rhonchi. Grip strength equal, strong. Distal pulses were not checked. Patient reports she does take medications as prescribed. Patient states she follows a Regular diet. The patient reports no specific efforts to gain or lose weight..She would like to lose weight while in the program. Patient's weight will be monitored closely. Demonstration and practice of PLB using pulse oximeter. Patient able to return demonstration satisfactorily. Safety and hand hygiene in the exercise area reviewed with patient. Patient voices understanding of the  information reviewed. Department expectations discussed with patient and achievable goals were set. The patient shows enthusiasm about attending the program and we look forward to working with this nice lady. The patient is scheduled for a 6 min walk test on Tuesday, November 28, 2016 @ 3:30 pm and to begin exercise on Tuesday December 05, 2016.  [id number] orientation

## 2016-11-28 ENCOUNTER — Ambulatory Visit (HOSPITAL_COMMUNITY): Payer: Managed Care, Other (non HMO)

## 2016-11-28 ENCOUNTER — Encounter (HOSPITAL_COMMUNITY)
Admission: RE | Admit: 2016-11-28 | Discharge: 2016-11-28 | Disposition: A | Discharge status: Home / Self Care | Payer: Managed Care, Other (non HMO) | Source: Ambulatory Visit | Attending: Pulmonary Disease | Admitting: Pulmonary Disease

## 2016-11-28 DIAGNOSIS — Z86718 Personal history of other venous thrombosis and embolism: Secondary | ICD-10-CM | POA: Diagnosis not present

## 2016-11-28 DIAGNOSIS — I10 Essential (primary) hypertension: Secondary | ICD-10-CM | POA: Diagnosis not present

## 2016-11-28 DIAGNOSIS — J449 Chronic obstructive pulmonary disease, unspecified: Secondary | ICD-10-CM

## 2016-11-28 DIAGNOSIS — Z79899 Other long term (current) drug therapy: Secondary | ICD-10-CM | POA: Diagnosis not present

## 2016-11-28 DIAGNOSIS — M545 Low back pain: Secondary | ICD-10-CM | POA: Diagnosis not present

## 2016-11-28 DIAGNOSIS — Z87891 Personal history of nicotine dependence: Secondary | ICD-10-CM | POA: Diagnosis not present

## 2016-11-30 ENCOUNTER — Telehealth: Payer: Self-pay | Admitting: *Deleted

## 2016-11-30 DIAGNOSIS — Z79899 Other long term (current) drug therapy: Secondary | ICD-10-CM

## 2016-11-30 NOTE — Telephone Encounter (Signed)
Called pt re lab results

## 2016-11-30 NOTE — Telephone Encounter (Signed)
-----   Message from Lyn RecordsHenry W Smith, MD sent at 11/29/2016  8:15 PM EDT ----- Let the patient know lipids are at target. Recheck liver and lipid in 1 year. A copy will be sent to Lonie Peakonroy, Nathan, PA-C

## 2016-11-30 NOTE — Progress Notes (Signed)
Pulmonary Individual Treatment Plan  Patient Details  Name: Erin Morrison MRN: [id number] Date of Birth: 01-30-1951 Referring Provider:     Pulmonary Rehab Walk Test from 11/28/2016 in [place]  Referring Provider  Dr. [name]      Initial Encounter Date:    Pulmonary Rehab Walk Test from 11/28/2016 in [place]  Date  11/28/16  Referring Provider  Dr. [name]      Visit Diagnosis: Chronic obstructive pulmonary disease, unspecified COPD type ([place])  Patient's Home Medications on Admission:   Current Outpatient Prescriptions:  .  acetaminophen (TYLENOL) 500 MG tablet, Take 500 mg by mouth 2 (two) times daily as needed for moderate pain or headache., Disp: , Rfl:  .  albuterol (PROVENTIL HFA;VENTOLIN HFA) 108 (90 Base) MCG/ACT inhaler, Inhale 2 puffs into the lungs every 6 (six) hours as needed for wheezing or shortness of breath. , Disp: , Rfl:  .  albuterol (PROVENTIL) (2.5 MG/3ML) 0.083% nebulizer solution, Take 2.5 mg by nebulization every 6 (six) hours as needed for wheezing or shortness of breath., Disp: , Rfl:  .  aspirin 81 MG tablet, Take 81 mg by mouth daily., Disp: , Rfl:  .  atorvastatin (LIPITOR) 40 MG tablet, Take 1 tablet (40 mg total) by mouth daily at 6 PM., Disp: 90 tablet, Rfl: 3 .  budesonide (PULMICORT) 0.5 MG/2ML nebulizer solution, USE 1 VIAL VIA NEBULIZER 2 TIMES DAILY -DX CODE J44.9, Disp: 120 mL, Rfl: 3 .  cetirizine (ZYRTEC) 10 MG tablet, Take 10 mg by mouth daily., Disp: , Rfl:  .  DALIRESP 500 MCG TABS tablet, TAKE 1 TABLET BY MOUTH DAILY., Disp: 30 tablet, Rfl: 3 .  esomeprazole (NEXIUM) 40 MG capsule, Take 40 mg by mouth daily as needed (for acid reflex). , Disp: , Rfl:  .  Glycopyrrolate (LONHALA MAGNAIR STARTER KIT) 25 MCG/ML SOLN, Inhale 25 mcg into the lungs 2 (two) times daily., Disp: 1 mL, Rfl: 6 .  isosorbide mononitrate (IMDUR) 30 MG 24 hr tablet, Take 0.5 tablets (15 mg total) by  mouth daily., Disp: 45 tablet, Rfl: 2 .  montelukast (SINGULAIR) 10 MG tablet, TAKE 1 TABLET BY MOUTH AT BEDTIME, Disp: 30 tablet, Rfl: 3 .  nitroGLYCERIN (NITROSTAT) 0.4 MG SL tablet, Place 1 tablet (0.4 mg total) under the tongue every 5 (five) minutes as needed for chest pain., Disp: 90 tablet, Rfl: 3 .  PERFOROMIST 20 MCG/2ML nebulizer solution, USE 1 VIAL VIA NEBULIZER TWICE A DAY, Disp: 120 mL, Rfl: 3 .  Polyethyl Glycol-Propyl Glycol (SYSTANE OP), Apply 1-2 drops to eye daily as needed (dry eyes)., Disp: , Rfl:  .  predniSONE (DELTASONE) 10 MG tablet, TAKE 1 TABLET BY MOUTH DAILY WITH BREAKFAST., Disp: 30 tablet, Rfl: 1 .  Spacer/Aero-Holding Chambers (AEROCHAMBER MV) inhaler, Use as instructed, Disp: 1 each, Rfl: 0  Past Medical History: Past Medical History:  Diagnosis Date  . Acute DVT (deep venous thrombosis) ([place])   . Allergic rhinitis   . Atrophic vaginitis   . COPD (chronic obstructive pulmonary disease) ([place])   . Hypertension   . Low back pain   . Umbilical hernia     Tobacco Use: History  Smoking Status  . Former Smoker  . Packs/day: 1.50  . Years: 46.00  . Types: Cigarettes  . Start date: 01/31/1964  . Quit date: 01/30/2010  Smokeless Tobacco  . Never Used    Labs: Recent Review [employer]  for [facility] Cardiac and Pulmonary Rehab Latest Ref Rng & Units 12/04/2015 03/03/2016 03/03/2016 04/05/2016 11/27/2016   Cholestrol 100 - 199 mg/dL 207(H) - - 138 124   LDLCALC 0 - 99 mg/dL 134(H) - - 55 52   HDL >39 mg/dL 57 - - 60 55   Trlycerides 0 - 149 mg/dL 79 - - 115 83   Hemoglobin A1c 4.8 - 5.6 % 5.5 - - - -   PHART 7.350 - 7.450 - - 7.419 - -   PCO2ART 32.0 - 48.0 mmHg - - 34.0 - -   HCO3 20.0 - 28.0 mmol/L - 22.3 22.0 - -   TCO2 0 - 100 mmol/L - 23 23 - -   ACIDBASEDEF 0.0 - 2.0 mmol/L - 2.0 2.0 - -   O2SAT % - 65.0 94.0 - -      Capillary Blood Glucose: No results found for: GLUCAP   Pulmonary Assessment Scores:     Pulmonary Assessment Scores     Row Name 11/30/16 0908         ADL UCSD   ADL Phase Entry       mMRC Score   mMRC Score 3        Pulmonary Function Assessment:     Pulmonary Function Assessment - 11/27/16 1029      Post Bronchodilator Spirometry Results   FEV1% 69 %   FEV1/FVC Ratio 66     Breath   Bilateral Breath Sounds Clear   Shortness of Breath Panic with Shortness of Breath;Limiting activity;Yes      Exercise Target Goals: Date: 11/28/16  Exercise Program Goal: Individual exercise prescription set with THRR, safety & activity barriers. Participant demonstrates ability to understand and report RPE using BORG scale, to self-measure pulse accurately, and to acknowledge the importance of the exercise prescription.  Exercise Prescription Goal: Starting with aerobic activity 30 plus minutes a day, 3 days per week for initial exercise prescription. Provide home exercise prescription and guidelines that participant acknowledges understanding prior to discharge.  Activity Barriers & Risk Stratification:     Activity Barriers & Cardiac Risk Stratification - 11/27/16 1033      Activity Barriers & Cardiac Risk Stratification   Activity Barriers --  Occassionally has bilateral hip pain      6 Minute Walk:     6 Minute Walk    Row Name 11/30/16 0859         6 Minute Walk   Phase Initial     Distance 1300 feet     Walk Time 6 minutes     # of Rest Breaks 0     MPH 2.46     METS 2.91     RPE 13     Perceived Dyspnea  1     Symptoms Yes (comment)     Comments wheelchair     Resting HR 92 bpm     Resting BP 134/82     Resting Oxygen Saturation  93 %     Exercise Oxygen Saturation  during 6 min walk 85 %     Max Ex. HR 113 bpm     Max Ex. BP 150/72       Interval HR   1 Minute HR 94     2 Minute HR 93     3 Minute HR 100     4 Minute HR 112     5 Minute HR 113     6 Minute HR 112  2 Minute Post HR 96     Interval Heart Rate? Yes       Interval Oxygen   Interval Oxygen? Yes      Baseline Oxygen Saturation % 93 %     1 Minute Oxygen Saturation % 92 %     1 Minute Liters of Oxygen 0 L     2 Minute Oxygen Saturation % 85 %     2 Minute Liters of Oxygen 0 L     3 Minute Oxygen Saturation % 93 %     3 Minute Liters of Oxygen 2 L     4 Minute Oxygen Saturation % 96 %     4 Minute Liters of Oxygen 2 L     5 Minute Oxygen Saturation % 93 %     5 Minute Liters of Oxygen 2 L     6 Minute Oxygen Saturation % 91 %     6 Minute Liters of Oxygen 2 L     2 Minute Post Oxygen Saturation % 96 %     2 Minute Post Liters of Oxygen 2 L        Oxygen Initial Assessment:     Oxygen Initial Assessment - 11/30/16 0907      Initial 6 min Walk   Oxygen Used Continuous;E-Tanks   Liters per minute 2     Program Oxygen Prescription   Program Oxygen Prescription Continuous;E-Tanks   Liters per minute 2   Comments started off on room air-85%-titrated to 2 liters      Oxygen Re-Evaluation:   Oxygen Discharge (Final Oxygen Re-Evaluation):   Initial Exercise Prescription:     Initial Exercise Prescription - 11/30/16 0900      Date of Initial Exercise RX and Referring Provider   Date 11/28/16   Referring Provider Dr. [name]     Oxygen   Oxygen Continuous   Liters 2     Recumbant Bike   Level 1   Watts 15   Minutes 17     NuStep   Level 1   Minutes 17   METs 1.4     Track   Laps 10   Minutes 17     Prescription Details   Frequency (times per week) 2   Duration Progress to 45 minutes of aerobic exercise without signs/symptoms of physical distress     Intensity   THRR 40-80% of Max Heartrate 61-122   Ratings of Perceived Exertion 11-13   Perceived Dyspnea 0-4     Progression   Progression Continue progressive overload as per policy without signs/symptoms or physical distress.     Resistance Training   Training Prescription Yes   Weight orange bands   Reps 10-15      Perform Capillary Blood Glucose checks as needed.  Exercise Prescription  Changes:   Exercise Comments:   Exercise Goals and Review:   Exercise Goals Re-Evaluation :   Discharge Exercise Prescription (Final Exercise Prescription Changes):   Nutrition:  Target Goals: Understanding of nutrition guidelines, daily intake of sodium <1526m, cholesterol <2067m calories 30% from fat and 7% or less from saturated fats, daily to have 5 or more servings of fruits and vegetables.  Biometrics:     Pre Biometrics - 11/27/16 1034      Pre Biometrics   Height _0  (1.575 m)   Weight 184 lb 15.5 oz (83.9 kg)   BMI (Calculated) 33.82   Grip Strength 31 kg  Nutrition Therapy Plan and Nutrition Goals:   Nutrition Discharge: Rate Your Plate Scores:   Nutrition Goals Re-Evaluation:   Nutrition Goals Discharge (Final Nutrition Goals Re-Evaluation):   Psychosocial: Target Goals: Acknowledge presence or absence of significant depression and/or stress, maximize coping skills, provide positive support system. Participant is able to verbalize types and ability to use techniques and skills needed for reducing stress and depression.  Initial Review & Psychosocial Screening:     Initial Psych Review & Screening - 11/27/16 1037      Initial Review   Current issues with None Identified     Family Dynamics   Good Support System? Yes     Barriers   Psychosocial barriers to participate in program There are no identifiable barriers or psychosocial needs.     Screening Interventions   Interventions Encouraged to exercise      Quality of Life Scores:   PHQ-9: Recent Review Flowsheet Data    Depression screen [facility] 2/9 11/27/2016   Decreased Interest 0   Down, Depressed, Hopeless 1   PHQ - 2 Score 1     Interpretation of Total Score  Total Score Depression Severity:  1-4 = Minimal depression, 5-9 = Mild depression, 10-14 = Moderate depression, 15-19 = Moderately severe depression, 20-27 = Severe depression   Psychosocial Evaluation and  Intervention:     Psychosocial Evaluation - 11/27/16 1037      Psychosocial Evaluation & Interventions   Interventions Encouraged to exercise with the program and follow exercise prescription   Continue Psychosocial Services  No Follow up required      Psychosocial Re-Evaluation:   Psychosocial Discharge (Final Psychosocial Re-Evaluation):   Education: Education Goals: Education classes will be provided on a weekly basis, covering required topics. Participant will state understanding/return demonstration of topics presented.  Learning Barriers/Preferences:     Learning Barriers/Preferences - 11/27/16 1028      Learning Barriers/Preferences   Learning Barriers None   Learning Preferences Group Instruction      Education Topics: Risk Factor Reduction:  -Group instruction that is supported by a PowerPoint presentation. Instructor discusses the definition of a risk factor, different risk factors for pulmonary disease, and how the heart and lungs work together.     Nutrition for Pulmonary Patient:  -Group instruction provided by PowerPoint slides, verbal discussion, and written materials to support subject matter. The instructor gives an explanation and review of healthy diet recommendations, which includes a discussion on weight management, recommendations for fruit and vegetable consumption, as well as protein, fluid, caffeine, fiber, sodium, sugar, and alcohol. Tips for eating when patients are short of breath are discussed.   Pursed Lip Breathing:  -Group instruction that is supported by demonstration and informational handouts. Instructor discusses the benefits of pursed lip and diaphragmatic breathing and detailed demonstration on how to preform both.     Oxygen Safety:  -Group instruction provided by PowerPoint, verbal discussion, and written material to support subject matter. There is an overview of "What is Oxygen" and "Why do we need it".  Instructor also reviews  how to create a safe environment for oxygen use, the importance of using oxygen as prescribed, and the risks of noncompliance. There is a brief discussion on traveling with oxygen and resources the patient may utilize.   Oxygen Equipment:  -Group instruction provided by [facility] Staff utilizing handouts, written materials, and equipment demonstrations.   Signs and Symptoms:  -Group instruction provided by written material and verbal discussion to support subject matter. Warning  signs and symptoms of infection, stroke, and heart attack are reviewed and when to call the physician/911 reinforced. Tips for preventing the spread of infection discussed.   Advanced Directives:  -Group instruction provided by verbal instruction and written material to support subject matter. Instructor reviews Advanced Directive laws and proper instruction for filling out document.   Pulmonary Video:  -Group video education that reviews the importance of medication and oxygen compliance, exercise, good nutrition, pulmonary hygiene, and pursed lip and diaphragmatic breathing for the pulmonary patient.   Exercise for the Pulmonary Patient:  -Group instruction that is supported by a PowerPoint presentation. Instructor discusses benefits of exercise, core components of exercise, frequency, duration, and intensity of an exercise routine, importance of utilizing pulse oximetry during exercise, safety while exercising, and options of places to exercise outside of rehab.     Pulmonary Medications:  -Verbally interactive group education provided by instructor with focus on inhaled medications and proper administration.   Anatomy and Physiology of the Respiratory System and Intimacy:  -Group instruction provided by PowerPoint, verbal discussion, and written material to support subject matter. Instructor reviews respiratory cycle and anatomical components of the respiratory system and their functions. Instructor also  reviews differences in obstructive and restrictive respiratory diseases with examples of each. Intimacy, Sex, and Sexuality differences are reviewed with a discussion on how relationships can change when diagnosed with pulmonary disease. Common sexual concerns are reviewed.   MD DAY -A group question and answer session with a medical doctor that allows participants to ask questions that relate to their pulmonary disease state.   OTHER EDUCATION -Group or individual verbal, written, or video instructions that support the educational goals of the pulmonary rehab program.   Knowledge Questionnaire Score:   Core Components/Risk Factors/Patient Goals at Admission:     Personal Goals and Risk Factors at Admission - 11/27/16 1035      Core Components/Risk Factors/Patient Goals on Admission    Weight Management Yes   Intervention Weight Management: Develop a combined nutrition and exercise program designed to reach desired caloric intake, while maintaining appropriate intake of nutrient and fiber, sodium and fats, and appropriate energy expenditure required for the weight goal.   Admit Weight 184 lb 15.5 oz (83.9 kg)   Goal Weight: Short Term 179 lb (81.2 kg)   Expected Outcomes Short Term: Continue to assess and modify interventions until short term weight is achieved   Improve shortness of breath with ADL's Yes   Intervention Provide education, individualized exercise plan and daily activity instruction to help decrease symptoms of SOB with activities of daily living.   Expected Outcomes Short Term: Achieves a reduction of symptoms when performing activities of daily living.   Develop more efficient breathing techniques such as purse lipped breathing and diaphragmatic breathing; and practicing self-pacing with activity Yes   Intervention Provide education, demonstration and support about specific breathing techniuqes utilized for more efficient breathing. Include techniques such as pursed  lipped breathing, diaphragmatic breathing and self-pacing activity.   Expected Outcomes Short Term: Participant will be able to demonstrate and use breathing techniques as needed throughout daily activities.   Increase knowledge of respiratory medications and ability to use respiratory devices properly  Yes   Intervention Provide education and demonstration as needed of appropriate use of medications, inhalers, and oxygen therapy.   Expected Outcomes Short Term: Achieves understanding of medications use. Understands that oxygen is a medication prescribed by physician. Demonstrates appropriate use of inhaler and oxygen therapy.  Core Components/Risk Factors/Patient Goals Review:    Core Components/Risk Factors/Patient Goals at Discharge (Final Review):    ITP Comments:   Comments:

## 2016-12-01 ENCOUNTER — Ambulatory Visit (HOSPITAL_COMMUNITY): Payer: Managed Care, Other (non HMO) | Attending: Cardiovascular Disease

## 2016-12-01 DIAGNOSIS — R079 Chest pain, unspecified: Secondary | ICD-10-CM | POA: Diagnosis not present

## 2016-12-01 LAB — MYOCARDIAL PERFUSION IMAGING
CSEPPHR: 104 {beats}/min
LHR: 0.34
LVDIAVOL: 95 mL (ref 46–106)
LVSYSVOL: 44 mL
Rest HR: 67 {beats}/min
SDS: 4
SRS: 2
SSS: 6
TID: 1.46

## 2016-12-01 MED ORDER — TECHNETIUM TC 99M TETROFOSMIN IV KIT
30.5000 | PACK | Freq: Once | INTRAVENOUS | Status: AC | PRN
  Administered 2016-12-01: 30.5 via INTRAVENOUS
  Filled 2016-12-01: qty 31

## 2016-12-01 MED ORDER — TECHNETIUM TC 99M TETROFOSMIN IV KIT
10.2000 | PACK | Freq: Once | INTRAVENOUS | Status: AC | PRN
  Administered 2016-12-01: 10.2 via INTRAVENOUS
  Filled 2016-12-01: qty 11

## 2016-12-01 MED ORDER — REGADENOSON 0.4 MG/5ML IV SOLN
0.4000 mg | Freq: Once | INTRAVENOUS | Status: AC
  Administered 2016-12-01: 0.4 mg via INTRAVENOUS

## 2016-12-05 ENCOUNTER — Other Ambulatory Visit: Payer: Self-pay | Admitting: Adult Health

## 2016-12-05 ENCOUNTER — Encounter (HOSPITAL_COMMUNITY)
Admission: RE | Admit: 2016-12-05 | Discharge: 2016-12-05 | Disposition: A | Discharge status: Home / Self Care | Payer: Managed Care, Other (non HMO) | Source: Ambulatory Visit | Attending: Pulmonary Disease | Admitting: Pulmonary Disease

## 2016-12-05 VITALS — Wt 186.5 lb

## 2016-12-05 DIAGNOSIS — Z87891 Personal history of nicotine dependence: Secondary | ICD-10-CM | POA: Diagnosis not present

## 2016-12-05 DIAGNOSIS — Z79899 Other long term (current) drug therapy: Secondary | ICD-10-CM | POA: Diagnosis not present

## 2016-12-05 DIAGNOSIS — I10 Essential (primary) hypertension: Secondary | ICD-10-CM | POA: Insufficient documentation

## 2016-12-05 DIAGNOSIS — J449 Chronic obstructive pulmonary disease, unspecified: Secondary | ICD-10-CM | POA: Insufficient documentation

## 2016-12-05 DIAGNOSIS — Z86718 Personal history of other venous thrombosis and embolism: Secondary | ICD-10-CM | POA: Insufficient documentation

## 2016-12-05 DIAGNOSIS — M545 Low back pain: Secondary | ICD-10-CM | POA: Diagnosis not present

## 2016-12-05 NOTE — Progress Notes (Signed)
Daily Session Note  Patient Details  Name: Erin Morrison MRN: [id number] Date of Birth: Feb 01, 1950 Referring Provider:     Pulmonary Rehab Walk Test from 11/28/2016 in [place]  Referring Provider  Dr. [name]      Encounter Date: 12/05/2016  Check In: Session Check In - 12/05/16 1329      Check-In   Location  [facility]    Staff Present  Su Hilt, MS, ACSM RCEP, Exercise Physiologist;[name], RN, Maxcine Ham, RN, Roque Cash, RN    Supervising physician immediately available to respond to emergencies  [facility] immediately available    Physician(s)  Dr. Broadus John    Medication changes reported      No    Fall or balance concerns reported     No    Tobacco Cessation  No Change    Warm-up and Cool-down  Performed as group-led instruction    Resistance Training Performed  Yes    VAD Patient?  No      Pain Assessment   Currently in Pain?  No/denies    Multiple Pain Sites  No       Capillary Blood Glucose: No results found for this or any previous visit (from the past 24 hour(s)).  Exercise Prescription Changes - 12/05/16 1500      Response to Exercise   Blood Pressure (Admit)  114/64    Blood Pressure (Exercise)  132/64    Blood Pressure (Exit)  120/70    Heart Rate (Admit)  86 bpm    Heart Rate (Exercise)  102 bpm    Heart Rate (Exit)  79 bpm    Oxygen Saturation (Admit)  95 %    Oxygen Saturation (Exercise)  96 %    Oxygen Saturation (Exit)  94 %    Rating of Perceived Exertion (Exercise)  13    Perceived Dyspnea (Exercise)  1    Duration  Progress to 45 minutes of aerobic exercise without signs/symptoms of physical distress    Intensity  Other (comment) 40-80% of HRR   40-80% of HRR     Progression   Progression  Continue to progress workloads to maintain intensity without signs/symptoms of physical distress.      Resistance Training   Training Prescription  Yes    Weight  orange  bands    Reps  10-15    Time  10 Minutes      Oxygen   Oxygen  Continuous    Liters  2      NuStep   Level  1    Minutes  51    METs  2       Social History   Tobacco Use  Smoking Status Former Smoker  . Packs/day: 1.50  . Years: 46.00  . Pack years: 69.00  . Types: Cigarettes  . Start date: 01/31/1964  . Last attempt to quit: 01/30/2010  . Years since quitting: 6.8  Smokeless Tobacco Never Used    Goals Met:  No report of cardiac concerns or symptoms Strength training completed today  Goals Unmet:  Not Applicable  Comments: Service time is from 1330 to 1500    Dr. [name] is Medical Director for Pulmonary Rehab at [facility].

## 2016-12-05 NOTE — Addendum Note (Signed)
Encounter addended by: Beatrix FettersHughes, Ashleigh Arya G, RN on: 12/05/2016 4:13 PM  Actions taken: Visit Navigator Flowsheet section accepted

## 2016-12-07 ENCOUNTER — Encounter (HOSPITAL_COMMUNITY)
Admission: RE | Admit: 2016-12-07 | Discharge: 2016-12-07 | Disposition: A | Discharge status: Home / Self Care | Payer: Managed Care, Other (non HMO) | Source: Ambulatory Visit | Attending: Pulmonary Disease | Admitting: Pulmonary Disease

## 2016-12-07 VITALS — Wt 186.3 lb

## 2016-12-07 DIAGNOSIS — J449 Chronic obstructive pulmonary disease, unspecified: Secondary | ICD-10-CM

## 2016-12-07 NOTE — Progress Notes (Signed)
Daily Session Note  Patient Details  Name: TEYONNA PLAISTED MRN: [id number] Date of Birth: Jul 28, 1950 Referring Provider:     Pulmonary Rehab Walk Test from 11/28/2016 in [place]  Referring Provider  Dr. [name]      Encounter Date: 12/07/2016  Check In: Session Check In - 12/07/16 1024      Check-In   Location  [facility]    Staff Present  Su Hilt, MS, ACSM RCEP, Exercise Physiologist;[name], RN, Maxcine Ham, RN, Roque Cash, RN    Supervising physician immediately available to respond to emergencies  [facility] immediately available    Physician(s)  Dr. Quincy Simmonds    Medication changes reported      No    Fall or balance concerns reported     No    Tobacco Cessation  No Change    Warm-up and Cool-down  Performed as group-led instruction    Resistance Training Performed  Yes    VAD Patient?  No      Pain Assessment   Currently in Pain?  No/denies    Multiple Pain Sites  No       Capillary Blood Glucose: No results found for this or any previous visit (from the past 24 hour(s)).  Exercise Prescription Changes - 12/07/16 1300      Response to Exercise   Blood Pressure (Admit)  132/70    Blood Pressure (Exercise)  210/100 recovered to 130/74   recovered to 130/74   Blood Pressure (Exit)  122/70    Heart Rate (Admit)  83 bpm    Heart Rate (Exercise)  104 bpm    Heart Rate (Exit)  84 bpm    Oxygen Saturation (Admit)  95 %    Oxygen Saturation (Exercise)  96 %    Oxygen Saturation (Exit)  96 %    Rating of Perceived Exertion (Exercise)  13    Perceived Dyspnea (Exercise)  2    Duration  Progress to 45 minutes of aerobic exercise without signs/symptoms of physical distress    Intensity  Other (comment) 40-80% of HRR   40-80% of HRR     Progression   Progression  Continue to progress workloads to maintain intensity without signs/symptoms of physical distress.      Resistance Training   Training Prescription  Yes    Weight  orange bands    Reps  10-15    Time  10 Minutes      Oxygen   Oxygen  Continuous    Liters  2      Recumbant Bike   Level  1    Watts  15    Minutes  17      NuStep   Level  1    Minutes  17    METs  1.9       Social History   Tobacco Use  Smoking Status Former Smoker  . Packs/day: 1.50  . Years: 46.00  . Pack years: 69.00  . Types: Cigarettes  . Start date: 01/31/1964  . Last attempt to quit: 01/30/2010  . Years since quitting: 6.8  Smokeless Tobacco Never Used    Goals Met:  Exercise tolerated well No report of cardiac concerns or symptoms Strength training completed today  Goals Unmet:  Not Applicable  Comments: Service time is from 10:30a to 12:30p    Dr. [name] is Medical Director for Pulmonary Rehab at [facility].

## 2016-12-12 ENCOUNTER — Encounter (HOSPITAL_COMMUNITY)
Admission: RE | Admit: 2016-12-12 | Discharge: 2016-12-12 | Disposition: A | Discharge status: Home / Self Care | Payer: Managed Care, Other (non HMO) | Source: Ambulatory Visit | Attending: Pulmonary Disease | Admitting: Pulmonary Disease

## 2016-12-12 VITALS — Wt 185.4 lb

## 2016-12-12 DIAGNOSIS — J449 Chronic obstructive pulmonary disease, unspecified: Secondary | ICD-10-CM | POA: Diagnosis not present

## 2016-12-12 NOTE — Progress Notes (Signed)
Daily Session Note  Patient Details  Name: Erin Morrison MRN: [id number] Date of Birth: 1951/01/24 Referring Provider:     Pulmonary Rehab Walk Test from 11/28/2016 in [place]  Referring Provider  Dr. [name]      Encounter Date: 12/12/2016  Check In: Session Check In - 12/12/16 1330      Check-In   Location  [facility]    Staff Present  [name], RN, BSN;Molly diVincenzo, MS, ACSM RCEP, Exercise Physiologist;Kofi Murrell Ysidro Evert, RN;[name], RN, BSN    Supervising physician immediately available to respond to emergencies  [facility] immediately available    Physician(s)  Dr. Maylene Roes    Medication changes reported      No    Fall or balance concerns reported     No    Tobacco Cessation  No Change    Warm-up and Cool-down  Performed as group-led instruction    Resistance Training Performed  Yes    VAD Patient?  No      Pain Assessment   Currently in Pain?  No/denies    Multiple Pain Sites  No       Capillary Blood Glucose: No results found for this or any previous visit (from the past 24 hour(s)).  Exercise Prescription Changes - 12/12/16 1500      Response to Exercise   Blood Pressure (Admit)  110/50    Blood Pressure (Exercise)  120/66    Blood Pressure (Exit)  104/60    Heart Rate (Admit)  77 bpm    Heart Rate (Exercise)  122 bpm    Heart Rate (Exit)  90 bpm    Oxygen Saturation (Admit)  96 %    Oxygen Saturation (Exercise)  94 %    Oxygen Saturation (Exit)  94 %    Rating of Perceived Exertion (Exercise)  15    Perceived Dyspnea (Exercise)  3    Duration  Progress to 45 minutes of aerobic exercise without signs/symptoms of physical distress    Intensity  THRR unchanged      Progression   Progression  Continue to progress workloads to maintain intensity without signs/symptoms of physical distress.      Resistance Training   Training Prescription  Yes    Weight  orange bands    Reps  10-15    Time   10 Minutes      Oxygen   Oxygen  Continuous    Liters  2      Recumbant Bike   Level  1    Minutes  17      NuStep   Level  1    Minutes  17    METs  2.1      Track   Laps  4    Minutes  17       Social History   Tobacco Use  Smoking Status Former Smoker  . Packs/day: 1.50  . Years: 46.00  . Pack years: 69.00  . Types: Cigarettes  . Start date: 01/31/1964  . Last attempt to quit: 01/30/2010  . Years since quitting: 6.8  Smokeless Tobacco Never Used    Goals Met:  Exercise tolerated well No report of cardiac concerns or symptoms Strength training completed today  Goals Unmet:  Not Applicable  Comments: Service time is from 1330 to 1500    Dr. [name] is Medical Director for Pulmonary Rehab at [facility].

## 2016-12-14 ENCOUNTER — Encounter (HOSPITAL_COMMUNITY)
Admission: RE | Admit: 2016-12-14 | Discharge: 2016-12-14 | Disposition: A | Discharge status: Home / Self Care | Payer: Managed Care, Other (non HMO) | Source: Ambulatory Visit | Attending: Pulmonary Disease | Admitting: Pulmonary Disease

## 2016-12-14 DIAGNOSIS — J449 Chronic obstructive pulmonary disease, unspecified: Secondary | ICD-10-CM

## 2016-12-14 NOTE — Progress Notes (Signed)
Pulmonary Individual Treatment Plan  Patient Details  Name: Erin Morrison MRN: [id number] Date of Birth: Sep 10, 1950 Referring Provider:     Pulmonary Rehab Walk Test from 11/28/2016 in [facility]  Referring Provider  Dr. [name]      Initial Encounter Date:    Pulmonary Rehab Walk Test from 11/28/2016 in [facility]  Date  11/28/16  Referring Provider  Dr. [name]      Visit Diagnosis: Chronic obstructive pulmonary disease, unspecified COPD type ([place])  Patient's Home Medications on Admission:   Current Outpatient Medications:  .  acetaminophen (TYLENOL) 500 MG tablet, Take 500 mg by mouth 2 (two) times daily as needed for moderate pain or headache., Disp: , Rfl:  .  albuterol (PROVENTIL HFA;VENTOLIN HFA) 108 (90 Base) MCG/ACT inhaler, Inhale 2 puffs into the lungs every 6 (six) hours as needed for wheezing or shortness of breath. , Disp: , Rfl:  .  albuterol (PROVENTIL) (2.5 MG/3ML) 0.083% nebulizer solution, Take 2.5 mg by nebulization every 6 (six) hours as needed for wheezing or shortness of breath., Disp: , Rfl:  .  aspirin 81 MG tablet, Take 81 mg by mouth daily., Disp: , Rfl:  .  atorvastatin (LIPITOR) 40 MG tablet, Take 1 tablet (40 mg total) by mouth daily at 6 PM., Disp: 90 tablet, Rfl: 3 .  budesonide (PULMICORT) 0.5 MG/2ML nebulizer solution, USE 1 VIAL VIA NEBULIZER 2 TIMES DAILY -DX CODE J44.9, Disp: 120 mL, Rfl: 3 .  cetirizine (ZYRTEC) 10 MG tablet, Take 10 mg by mouth daily., Disp: , Rfl:  .  DALIRESP 500 MCG TABS tablet, TAKE 1 TABLET BY MOUTH DAILY., Disp: 30 tablet, Rfl: 3 .  esomeprazole (NEXIUM) 40 MG capsule, Take 40 mg by mouth daily as needed (for acid reflex). , Disp: , Rfl:  .  Glycopyrrolate (LONHALA MAGNAIR STARTER KIT) 25 MCG/ML SOLN, Inhale 25 mcg into the lungs 2 (two) times daily., Disp: 1 mL, Rfl: 6 .  isosorbide mononitrate (IMDUR) 30 MG 24 hr tablet, Take 0.5 tablets (15 mg total) by  mouth daily., Disp: 45 tablet, Rfl: 2 .  montelukast (SINGULAIR) 10 MG tablet, TAKE 1 TABLET BY MOUTH AT BEDTIME, Disp: 30 tablet, Rfl: 3 .  nitroGLYCERIN (NITROSTAT) 0.4 MG SL tablet, Place 1 tablet (0.4 mg total) under the tongue every 5 (five) minutes as needed for chest pain., Disp: 90 tablet, Rfl: 3 .  PERFOROMIST 20 MCG/2ML nebulizer solution, USE 1 VIAL VIA NEBULIZER TWICE A DAY, Disp: 120 mL, Rfl: 3 .  Polyethyl Glycol-Propyl Glycol (SYSTANE OP), Apply 1-2 drops to eye daily as needed (dry eyes)., Disp: , Rfl:  .  predniSONE (DELTASONE) 10 MG tablet, TAKE 1 TABLET BY MOUTH DAILY WITH BREAKFAST., Disp: 30 tablet, Rfl: 1 .  Spacer/Aero-Holding Chambers (AEROCHAMBER MV) inhaler, Use as instructed, Disp: 1 each, Rfl: 0  Past Medical History: Past Medical History:  Diagnosis Date  . Acute DVT (deep venous thrombosis) ([place])   . Allergic rhinitis   . Atrophic vaginitis   . COPD (chronic obstructive pulmonary disease) ([place])   . Hypertension   . Low back pain   . Umbilical hernia     Tobacco Use: Social History   Tobacco Use  Smoking Status Former Smoker  . Packs/day: 1.50  . Years: 46.00  . Pack years: 69.00  . Types: Cigarettes  . Start date: 01/31/1964  . Last attempt to quit: 01/30/2010  . Years since quitting: 6.8  Smokeless Tobacco  Never Used    Labs: Recent Review Flowsheet Data    Labs for ITP Cardiac and Pulmonary Rehab Latest Ref Rng & Units 12/04/2015 03/03/2016 03/03/2016 04/05/2016 11/27/2016   Cholestrol 100 - 199 mg/dL 207(H) - - 138 124   LDLCALC 0 - 99 mg/dL 134(H) - - 55 52   HDL >39 mg/dL 57 - - 60 55   Trlycerides 0 - 149 mg/dL 79 - - 115 83   Hemoglobin A1c 4.8 - 5.6 % 5.5 - - - -   PHART 7.350 - 7.450 - - 7.419 - -   PCO2ART 32.0 - 48.0 mmHg - - 34.0 - -   HCO3 20.0 - 28.0 mmol/L - 22.3 22.0 - -   TCO2 0 - 100 mmol/L - 23 23 - -   ACIDBASEDEF 0.0 - 2.0 mmol/L - 2.0 2.0 - -   O2SAT % - 65.0 94.0 - -      Capillary Blood Glucose: No results found for:  GLUCAP   Pulmonary Assessment Scores: Pulmonary Assessment Scores    Row Name 11/30/16 0908 12/05/16 1610       ADL UCSD   ADL Phase  Entry  Entry    SOB Score total  -  60      CAT Score   CAT Score  -  29 Entry      mMRC Score   mMRC Score  3  -       Pulmonary Function Assessment: Pulmonary Function Assessment - 11/27/16 1029      Post Bronchodilator Spirometry Results   FEV1%  69 %    FEV1/FVC Ratio  66      Breath   Bilateral Breath Sounds  Clear    Shortness of Breath  Panic with Shortness of Breath;Limiting activity;Yes       Exercise Target Goals:    Exercise Program Goal: Individual exercise prescription set with THRR, safety & activity barriers. Participant demonstrates ability to understand and report RPE using BORG scale, to self-measure pulse accurately, and to acknowledge the importance of the exercise prescription.  Exercise Prescription Goal: Starting with aerobic activity 30 plus minutes a day, 3 days per week for initial exercise prescription. Provide home exercise prescription and guidelines that participant acknowledges understanding prior to discharge.  Activity Barriers & Risk Stratification: Activity Barriers & Cardiac Risk Stratification - 11/27/16 1033      Activity Barriers & Cardiac Risk Stratification   Activity Barriers  -- Occassionally has bilateral hip pain       6 Minute Walk: 6 Minute Walk    Row Name 11/30/16 0859         6 Minute Walk   Phase  Initial     Distance  1300 feet     Walk Time  6 minutes     # of Rest Breaks  0     MPH  2.46     METS  2.91     RPE  13     Perceived Dyspnea   1     Symptoms  Yes (comment)     Comments  wheelchair     Resting HR  92 bpm     Resting BP  134/82     Resting Oxygen Saturation   93 %     Exercise Oxygen Saturation  during 6 min walk  85 %     Max Ex. HR  113 bpm     Max Ex. BP  150/72  Interval HR   1 Minute HR  94     2 Minute HR  93     3 Minute HR  100     4  Minute HR  112     5 Minute HR  113     6 Minute HR  112     2 Minute Post HR  96     Interval Heart Rate?  Yes       Interval Oxygen   Interval Oxygen?  Yes     Baseline Oxygen Saturation %  93 %     1 Minute Oxygen Saturation %  92 %     1 Minute Liters of Oxygen  0 L     2 Minute Oxygen Saturation %  85 %     2 Minute Liters of Oxygen  0 L     3 Minute Oxygen Saturation %  93 %     3 Minute Liters of Oxygen  2 L     4 Minute Oxygen Saturation %  96 %     4 Minute Liters of Oxygen  2 L     5 Minute Oxygen Saturation %  93 %     5 Minute Liters of Oxygen  2 L     6 Minute Oxygen Saturation %  91 %     6 Minute Liters of Oxygen  2 L     2 Minute Post Oxygen Saturation %  96 %     2 Minute Post Liters of Oxygen  2 L        Oxygen Initial Assessment: Oxygen Initial Assessment - 11/30/16 0907      Initial 6 min Walk   Oxygen Used  Continuous;E-Tanks    Liters per minute  2      Program Oxygen Prescription   Program Oxygen Prescription  Continuous;E-Tanks    Liters per minute  2    Comments  started off on room air-85%-titrated to 2 liters       Oxygen Re-Evaluation: Oxygen Re-Evaluation    Row Name 12/12/16 0957             Program Oxygen Prescription   Program Oxygen Prescription  Continuous;E-Tanks       Liters per minute  2       Comments  2 liters is sufficient at this time while exercising         Home Oxygen   Home Oxygen Device  Home Concentrator;E-Tanks       Sleep Oxygen Prescription  Continuous       Liters per minute  3       Home Exercise Oxygen Prescription  Continuous       Liters per minute  2       Home at Rest Exercise Oxygen Prescription  Continuous       Liters per minute  2       Compliance with Home Oxygen Use  Yes         Goals/Expected Outcomes   Short Term Goals  To learn and exhibit compliance with exercise, home and travel O2 prescription       Long  Term Goals  Exhibits compliance with exercise, home and travel O2 prescription        Comments  Just started program, 2 liters is sufficient while exercising.       Goals/Expected Outcomes  Continue compliance with oxygen useage  Oxygen Discharge (Final Oxygen Re-Evaluation): Oxygen Re-Evaluation - 12/12/16 0957      Program Oxygen Prescription   Program Oxygen Prescription  Continuous;E-Tanks    Liters per minute  2    Comments  2 liters is sufficient at this time while exercising      Home Oxygen   Home Oxygen Device  Home Concentrator;E-Tanks    Sleep Oxygen Prescription  Continuous    Liters per minute  3    Home Exercise Oxygen Prescription  Continuous    Liters per minute  2    Home at Rest Exercise Oxygen Prescription  Continuous    Liters per minute  2    Compliance with Home Oxygen Use  Yes      Goals/Expected Outcomes   Short Term Goals  To learn and exhibit compliance with exercise, home and travel O2 prescription    Long  Term Goals  Exhibits compliance with exercise, home and travel O2 prescription    Comments  Just started program, 2 liters is sufficient while exercising.    Goals/Expected Outcomes  Continue compliance with oxygen useage       Initial Exercise Prescription: Initial Exercise Prescription - 11/30/16 0900      Date of Initial Exercise RX and Referring Provider   Date  11/28/16    Referring Provider  Dr. [name]      Oxygen   Oxygen  Continuous    Liters  2      Recumbant Bike   Level  1    Watts  15    Minutes  17      NuStep   Level  1    Minutes  17    METs  1.4      Track   Laps  10    Minutes  17      Prescription Details   Frequency (times per week)  2    Duration  Progress to 45 minutes of aerobic exercise without signs/symptoms of physical distress      Intensity   THRR 40-80% of Max Heartrate  61-122    Ratings of Perceived Exertion  11-13    Perceived Dyspnea  0-4      Progression   Progression  Continue progressive overload as per policy without signs/symptoms or physical distress.       Resistance Training   Training Prescription  Yes    Weight  orange bands    Reps  10-15       Perform Capillary Blood Glucose checks as needed.  Exercise Prescription Changes: Exercise Prescription Changes    Row Name 12/05/16 1500 12/07/16 1300 12/12/16 1500         Response to Exercise   Blood Pressure (Admit)  114/64  132/70  110/50     Blood Pressure (Exercise)  132/64  210/100 recovered to 130/74  120/66     Blood Pressure (Exit)  120/70  122/70  104/60     Heart Rate (Admit)  86 bpm  83 bpm  77 bpm     Heart Rate (Exercise)  102 bpm  104 bpm  122 bpm     Heart Rate (Exit)  79 bpm  84 bpm  90 bpm     Oxygen Saturation (Admit)  95 %  95 %  96 %     Oxygen Saturation (Exercise)  96 %  96 %  94 %     Oxygen Saturation (Exit)  94 %  96 %  94 %     Rating of Perceived Exertion (Exercise)  _0 Perceived Dyspnea (Exercise)  _1 Duration  Progress to 45 minutes of aerobic exercise without signs/symptoms of physical distress  Progress to 45 minutes of aerobic exercise without signs/symptoms of physical distress  Progress to 45 minutes of aerobic exercise without signs/symptoms of physical distress     Intensity  Other (comment) 40-80% of HRR  Other (comment) 40-80% of HRR  THRR unchanged       Progression   Progression  Continue to progress workloads to maintain intensity without signs/symptoms of physical distress.  Continue to progress workloads to maintain intensity without signs/symptoms of physical distress.  Continue to progress workloads to maintain intensity without signs/symptoms of physical distress.       Resistance Training   Training Prescription  Yes  Yes  Yes     Weight  orange bands  orange bands  orange bands     Reps  10-15  10-15  10-15     Time  10 Minutes  10 Minutes  10 Minutes       Oxygen   Oxygen  Continuous  Continuous  Continuous     Liters  _2 Recumbant Bike   Level  -  1  1     Watts  -  15  -     Minutes  -   17  17       NuStep   Level  _3 Minutes  51  17  17     METs  2  1.9  2.1       Track   Laps  -  -  4     Minutes  -  -  17        Exercise Comments:   Exercise Goals and Review:   Exercise Goals Re-Evaluation : Exercise Goals Re-Evaluation    Row Name 12/12/16 0806             Exercise Goal Re-Evaluation   Exercise Goals Review  Increase Physical Activity;Increase Strength and Stamina;Able to understand and use Dyspnea scale;Able to understand and use rate of perceived exertion (RPE) scale;Knowledge and understanding of Target Heart Rate Range (THRR);Understanding of Exercise Prescription       Comments  Patient has only attended 2 exercise sessions. Will cont. to monitor and progress as able.        Expected Outcomes  Through exercise at rehab and at home, patient will increase strength and stamina and find that ADL's are easier to preform.           Discharge Exercise Prescription (Final Exercise Prescription Changes): Exercise Prescription Changes - 12/12/16 1500      Response to Exercise   Blood Pressure (Admit)  110/50    Blood Pressure (Exercise)  120/66    Blood Pressure (Exit)  104/60    Heart Rate (Admit)  77 bpm    Heart Rate (Exercise)  122 bpm    Heart Rate (Exit)  90 bpm    Oxygen Saturation (Admit)  96 %    Oxygen Saturation (Exercise)  94 %    Oxygen Saturation (Exit)  94 %    Rating of Perceived Exertion (Exercise)  15    Perceived Dyspnea (Exercise)  3  Duration  Progress to 45 minutes of aerobic exercise without signs/symptoms of physical distress    Intensity  THRR unchanged      Progression   Progression  Continue to progress workloads to maintain intensity without signs/symptoms of physical distress.      Resistance Training   Training Prescription  Yes    Weight  orange bands    Reps  10-15    Time  10 Minutes      Oxygen   Oxygen  Continuous    Liters  2      Recumbant Bike   Level  1    Minutes  17      NuStep    Level  1    Minutes  17    METs  2.1      Track   Laps  4    Minutes  17       Nutrition:  Target Goals: Understanding of nutrition guidelines, daily intake of sodium <1553m, cholesterol <2046m calories 30% from fat and 7% or less from saturated fats, daily to have 5 or more servings of fruits and vegetables.  Biometrics: Pre Biometrics - 11/27/16 1034      Pre Biometrics   Height  _0  (1.575 m)    Weight  184 lb 15.5 oz (83.9 kg)    BMI (Calculated)  33.82    Grip Strength  31 kg        Nutrition Therapy Plan and Nutrition Goals:   Nutrition Discharge: Rate Your Plate Scores:   Nutrition Goals Re-Evaluation:   Nutrition Goals Discharge (Final Nutrition Goals Re-Evaluation):   Psychosocial: Target Goals: Acknowledge presence or absence of significant depression and/or stress, maximize coping skills, provide positive support system. Participant is able to verbalize types and ability to use techniques and skills needed for reducing stress and depression.  Initial Review & Psychosocial Screening: Initial Psych Review & Screening - 11/27/16 1037      Initial Review   Current issues with  None Identified      Family Dynamics   Good Support System?  Yes      Barriers   Psychosocial barriers to participate in program  There are no identifiable barriers or psychosocial needs.      Screening Interventions   Interventions  Encouraged to exercise       Quality of Life Scores:   PHQ-9: Recent Review Flowsheet Data    [facility]   Decreased Interest 0   Down, Depressed, Hopeless 1   PHQ - 2 Score 1     Interpretation of Total Score  Total Score Depression Severity:  1-4 = Minimal depression, 5-9 = Mild depression, 10-14 = Moderate depression, 15-19 = Moderately severe depression, 20-27 = Severe depression   Psychosocial Evaluation and Intervention: Psychosocial Evaluation - 11/27/16 1037      Psychosocial Evaluation &  Interventions   Interventions  Encouraged to exercise with the program and follow exercise prescription    Continue Psychosocial Services   No Follow up required       Psychosocial Re-Evaluation: Psychosocial Re-Evaluation    [place] 12/12/16 1002             Psychosocial Re-Evaluation   Current issues with  None Identified       Interventions  Encouraged to attend Pulmonary Rehabilitation for the exercise       Continue Psychosocial Services   No Follow up required  Psychosocial Discharge (Final Psychosocial Re-Evaluation): Psychosocial Re-Evaluation - 12/12/16 1002      Psychosocial Re-Evaluation   Current issues with  None Identified    Interventions  Encouraged to attend Pulmonary Rehabilitation for the exercise    Continue Psychosocial Services   No Follow up required       Education: Education Goals: Education classes will be provided on a weekly basis, covering required topics. Participant will state understanding/return demonstration of topics presented.  Learning Barriers/Preferences: Learning Barriers/Preferences - 11/27/16 1028      Learning Barriers/Preferences   Learning Barriers  None    Learning Preferences  Group Instruction       Education Topics: Risk Factor Reduction:  -Group instruction that is supported by a PowerPoint presentation. Instructor discusses the definition of a risk factor, different risk factors for pulmonary disease, and how the heart and lungs work together.     Nutrition for Pulmonary Patient:  -Group instruction provided by PowerPoint slides, verbal discussion, and written materials to support subject matter. The instructor gives an explanation and review of healthy diet recommendations, which includes a discussion on weight management, recommendations for fruit and vegetable consumption, as well as protein, fluid, caffeine, fiber, sodium, sugar, and alcohol. Tips for eating when patients are short of breath are  discussed.   Pursed Lip Breathing:  -Group instruction that is supported by demonstration and informational handouts. Instructor discusses the benefits of pursed lip and diaphragmatic breathing and detailed demonstration on how to preform both.     Oxygen Safety:  -Group instruction provided by PowerPoint, verbal discussion, and written material to support subject matter. There is an overview of "What is Oxygen" and "Why do we need it".  Instructor also reviews how to create a safe environment for oxygen use, the importance of using oxygen as prescribed, and the risks of noncompliance. There is a brief discussion on traveling with oxygen and resources the patient may utilize.   Oxygen Equipment:  -Group instruction provided by [facility] Staff utilizing handouts, written materials, and equipment demonstrations.   Signs and Symptoms:  -Group instruction provided by written material and verbal discussion to support subject matter. Warning signs and symptoms of infection, stroke, and heart attack are reviewed and when to call the physician/911 reinforced. Tips for preventing the spread of infection discussed.   Advanced Directives:  -Group instruction provided by verbal instruction and written material to support subject matter. Instructor reviews Advanced Directive laws and proper instruction for filling out document.   Pulmonary Video:  -Group video education that reviews the importance of medication and oxygen compliance, exercise, good nutrition, pulmonary hygiene, and pursed lip and diaphragmatic breathing for the pulmonary patient.   Exercise for the Pulmonary Patient:  -Group instruction that is supported by a PowerPoint presentation. Instructor discusses benefits of exercise, core components of exercise, frequency, duration, and intensity of an exercise routine, importance of utilizing pulse oximetry during exercise, safety while exercising, and options of places to exercise outside  of rehab.     Pulmonary Medications:  -Verbally interactive group education provided by instructor with focus on inhaled medications and proper administration.   Anatomy and Physiology of the Respiratory System and Intimacy:  -Group instruction provided by PowerPoint, verbal discussion, and written material to support subject matter. Instructor reviews respiratory cycle and anatomical components of the respiratory system and their functions. Instructor also reviews differences in obstructive and restrictive respiratory diseases with examples of each. Intimacy, Sex, and Sexuality differences are reviewed with a discussion on how  relationships can change when diagnosed with pulmonary disease. Common sexual concerns are reviewed.   MD DAY -A group question and answer session with a medical doctor that allows participants to ask questions that relate to their pulmonary disease state.   OTHER EDUCATION -Group or individual verbal, written, or video instructions that support the educational goals of the pulmonary rehab program.   PULMONARY REHAB CHRONIC OBSTRUCTIVE PULMONARY DISEASE from 12/07/2016 in [place]  Date  12/07/16  Educator  [employer]  Instruction Review Code  2- Demonstrated Understanding      Knowledge Questionnaire Score: Knowledge Questionnaire Score - 12/05/16 1610      Knowledge Questionnaire Score   Pre Score  11/13       Core Components/Risk Factors/Patient Goals at Admission: Personal Goals and Risk Factors at Admission - 11/27/16 1035      Core Components/Risk Factors/Patient Goals on Admission    Weight Management  Yes    Intervention  Weight Management: Develop a combined nutrition and exercise program designed to reach desired caloric intake, while maintaining appropriate intake of nutrient and fiber, sodium and fats, and appropriate energy expenditure required for the weight goal.    Admit Weight  184 lb 15.5 oz (83.9 kg)    Goal  Weight: Short Term  179 lb (81.2 kg)    Expected Outcomes  Short Term: Continue to assess and modify interventions until short term weight is achieved    Improve shortness of breath with ADL's  Yes    Intervention  Provide education, individualized exercise plan and daily activity instruction to help decrease symptoms of SOB with activities of daily living.    Expected Outcomes  Short Term: Achieves a reduction of symptoms when performing activities of daily living.    Develop more efficient breathing techniques such as purse lipped breathing and diaphragmatic breathing; and practicing self-pacing with activity  Yes    Intervention  Provide education, demonstration and support about specific breathing techniuqes utilized for more efficient breathing. Include techniques such as pursed lipped breathing, diaphragmatic breathing and self-pacing activity.    Expected Outcomes  Short Term: Participant will be able to demonstrate and use breathing techniques as needed throughout daily activities.    Increase knowledge of respiratory medications and ability to use respiratory devices properly   Yes    Intervention  Provide education and demonstration as needed of appropriate use of medications, inhalers, and oxygen therapy.    Expected Outcomes  Short Term: Achieves understanding of medications use. Understands that oxygen is a medication prescribed by physician. Demonstrates appropriate use of inhaler and oxygen therapy.       Core Components/Risk Factors/Patient Goals Review:  Goals and Risk Factor Review    Row Name 12/12/16 1000             Core Components/Risk Factors/Patient Goals Review   Personal Goals Review  Weight Management/Obesity;Improve shortness of breath with ADL's;Increase knowledge of respiratory medications and ability to use respiratory devices properly.;Develop more efficient breathing techniques such as purse lipped breathing and diaphragmatic breathing and practicing  self-pacing with activity.       Review  Just started program, has attended 2 exercise sessions, too early to see progression toward goals       Expected Outcomes  In the next 30 days, should see progression toward goals          Core Components/Risk Factors/Patient Goals at Discharge (Final Review):  Goals and Risk Factor Review - 12/12/16 1000  Core Components/Risk Factors/Patient Goals Review   Personal Goals Review  Weight Management/Obesity;Improve shortness of breath with ADL's;Increase knowledge of respiratory medications and ability to use respiratory devices properly.;Develop more efficient breathing techniques such as purse lipped breathing and diaphragmatic breathing and practicing self-pacing with activity.    Review  Just started program, has attended 2 exercise sessions, too early to see progression toward goals    Expected Outcomes  In the next 30 days, should see progression toward goals       ITP Comments:   Comments: ITP REVIEW Pt is making expected progress toward pulmonary rehab goals after completing 3 sessions. Recommend continued exercise, life style modification, education, and utilization of breathing techniques to increase stamina and strength and decrease shortness of breath with exertion.

## 2016-12-19 ENCOUNTER — Encounter (HOSPITAL_COMMUNITY)
Admission: RE | Admit: 2016-12-19 | Discharge: 2016-12-19 | Disposition: A | Discharge status: Home / Self Care | Payer: Managed Care, Other (non HMO) | Source: Ambulatory Visit | Attending: Pulmonary Disease | Admitting: Pulmonary Disease

## 2016-12-19 VITALS — Wt 185.0 lb

## 2016-12-19 DIAGNOSIS — J449 Chronic obstructive pulmonary disease, unspecified: Secondary | ICD-10-CM | POA: Diagnosis not present

## 2016-12-19 NOTE — Progress Notes (Signed)
Daily Session Note  Patient Details  Name: MYESHIA FOJTIK MRN: [id number] Date of Birth: Apr 29, 1950 Referring Provider:     Pulmonary Rehab Walk Test from 11/28/2016 in [facility]  Referring Provider  Dr. [name]      Encounter Date: 12/19/2016  Check In: Session Check In - 12/19/16 1330      Check-In   Staff Present  [name], RN, BSN;Molly diVincenzo, MS, ACSM RCEP, Exercise Physiologist;Cortne Amara Ysidro Evert, RN;[name], RN, BSN    Supervising physician immediately available to respond to emergencies  [facility] immediately available    Physician(s)  Dr. Maylene Roes    Medication changes reported      No    Fall or balance concerns reported     No    Tobacco Cessation  No Change    Warm-up and Cool-down  Performed as group-led instruction    Resistance Training Performed  Yes    VAD Patient?  No      Pain Assessment   Currently in Pain?  No/denies    Multiple Pain Sites  No       Capillary Blood Glucose: No results found for this or any previous visit (from the past 24 hour(s)).  Exercise Prescription Changes - 12/19/16 1500      Response to Exercise   Blood Pressure (Admit)  124/62    Blood Pressure (Exercise)  120/66    Blood Pressure (Exit)  108/60    Heart Rate (Admit)  88 bpm    Heart Rate (Exercise)  125 bpm    Heart Rate (Exit)  75 bpm    Oxygen Saturation (Admit)  95 %    Oxygen Saturation (Exercise)  94 %    Oxygen Saturation (Exit)  96 %    Rating of Perceived Exertion (Exercise)  13    Perceived Dyspnea (Exercise)  1    Duration  Progress to 45 minutes of aerobic exercise without signs/symptoms of physical distress    Intensity  THRR unchanged      Progression   Progression  Continue to progress workloads to maintain intensity without signs/symptoms of physical distress.      Resistance Training   Training Prescription  Yes    Weight  orange bands    Reps  10-15    Time  10 Minutes      Oxygen   Oxygen   Continuous    Liters  2      Recumbant Bike   Level  1    Minutes  17      NuStep   Level  2    Minutes  17      Track   Laps  5    Minutes  17       Social History   Tobacco Use  Smoking Status Former Smoker  . Packs/day: 1.50  . Years: 46.00  . Pack years: 69.00  . Types: Cigarettes  . Start date: 01/31/1964  . Last attempt to quit: 01/30/2010  . Years since quitting: 6.8  Smokeless Tobacco Never Used    Goals Met:  Exercise tolerated well No report of cardiac concerns or symptoms Strength training completed today  Goals Unmet:  Not Applicable  Comments: Service time is from 1330 to 1500    Dr. [name] is Medical Director for Pulmonary Rehab at [facility].

## 2016-12-26 ENCOUNTER — Encounter (HOSPITAL_COMMUNITY)
Admission: RE | Admit: 2016-12-26 | Discharge: 2016-12-26 | Disposition: A | Discharge status: Home / Self Care | Payer: Managed Care, Other (non HMO) | Source: Ambulatory Visit | Attending: Pulmonary Disease | Admitting: Pulmonary Disease

## 2016-12-26 ENCOUNTER — Telehealth: Payer: Self-pay | Admitting: Pulmonary Disease

## 2016-12-26 DIAGNOSIS — J449 Chronic obstructive pulmonary disease, unspecified: Secondary | ICD-10-CM | POA: Diagnosis not present

## 2016-12-26 NOTE — Progress Notes (Signed)
Daily Session Note  Patient Details  Name: Erin Morrison MRN: [id number] Date of Birth: 02/12/1950 Referring Provider:     Pulmonary Rehab Walk Test from 11/28/2016 in [place]  Referring Provider  Dr. [name]      Encounter Date: 12/26/2016  Check In: Session Check In - 12/26/16 1542      Check-In   Location  [facility]    Staff Present  [name], RN, BSN;Molly diVincenzo, MS, ACSM RCEP, Exercise Physiologist;Alysandra Lobue Ysidro Evert, RN;[name], RN, BSN    Supervising physician immediately available to respond to emergencies  [facility] immediately available    Physician(s)  Dr. Maylene Roes    Medication changes reported      No    Fall or balance concerns reported     No    Tobacco Cessation  No Change    Warm-up and Cool-down  Performed as group-led instruction    Resistance Training Performed  Yes    VAD Patient?  No      Pain Assessment   Currently in Pain?  No/denies    Multiple Pain Sites  No       Capillary Blood Glucose: No results found for this or any previous visit (from the past 24 hour(s)).  Exercise Prescription Changes - 12/26/16 1500      Response to Exercise   Blood Pressure (Admit)  100/60    Blood Pressure (Exercise)  160/90    Blood Pressure (Exit)  132/66    Heart Rate (Admit)  80 bpm    Heart Rate (Exercise)  115 bpm    Heart Rate (Exit)  88 bpm    Oxygen Saturation (Admit)  95 %    Oxygen Saturation (Exercise)  95 %    Oxygen Saturation (Exit)  92 %    Rating of Perceived Exertion (Exercise)  15    Perceived Dyspnea (Exercise)  3    Duration  Progress to 45 minutes of aerobic exercise without signs/symptoms of physical distress    Intensity  THRR unchanged      Progression   Progression  Continue to progress workloads to maintain intensity without signs/symptoms of physical distress.      Resistance Training   Training Prescription  Yes    Weight  orange bands    Reps  10-15    Time   10 Minutes      Oxygen   Oxygen  Continuous    Liters  2      Recumbant Bike   Level  2    Minutes  17      NuStep   Level  2    Minutes  17      Track   Laps  6    Minutes  17       Social History   Tobacco Use  Smoking Status Former Smoker  . Packs/day: 1.50  . Years: 46.00  . Pack years: 69.00  . Types: Cigarettes  . Start date: 01/31/1964  . Last attempt to quit: 01/30/2010  . Years since quitting: 6.9  Smokeless Tobacco Never Used    Goals Met:  Exercise tolerated well No report of cardiac concerns or symptoms Strength training completed today  Goals Unmet:  Not Applicable  Comments: Service time is from 1330 to 1515    Dr. [name] is Medical Director for Pulmonary Rehab at [facility].

## 2016-12-26 NOTE — Telephone Encounter (Signed)
Ok she can be evaluated here for [employer]. JN please advise if this is ok.

## 2016-12-27 ENCOUNTER — Telehealth: Payer: Self-pay | Admitting: Pulmonary Disease

## 2016-12-27 NOTE — Telephone Encounter (Signed)
That's fine

## 2016-12-27 NOTE — Telephone Encounter (Signed)
Pt has been scheduled for POC eval tomorrow at 3:00. Pt is aware and voiced her understanding. Nothing further is needed.

## 2016-12-27 NOTE — Telephone Encounter (Signed)
Called and spoke with pt's daughter in law. [name] is aware that it is okay to do POC eval after attending pulm rehab.  Nothing further is needed.

## 2016-12-28 ENCOUNTER — Encounter (HOSPITAL_COMMUNITY): Payer: Managed Care, Other (non HMO)

## 2016-12-28 ENCOUNTER — Encounter (HOSPITAL_COMMUNITY)
Admission: RE | Admit: 2016-12-28 | Discharge: 2016-12-28 | Disposition: A | Discharge status: Home / Self Care | Payer: Managed Care, Other (non HMO) | Source: Ambulatory Visit | Attending: Pulmonary Disease | Admitting: Pulmonary Disease

## 2016-12-28 ENCOUNTER — Ambulatory Visit: Payer: Managed Care, Other (non HMO)

## 2016-12-28 ENCOUNTER — Ambulatory Visit (INDEPENDENT_AMBULATORY_CARE_PROVIDER_SITE_OTHER): Payer: Managed Care, Other (non HMO) | Admitting: *Deleted

## 2016-12-28 VITALS — Wt 185.8 lb

## 2016-12-28 DIAGNOSIS — J449 Chronic obstructive pulmonary disease, unspecified: Secondary | ICD-10-CM

## 2016-12-28 NOTE — Progress Notes (Signed)
I have reviewed a Home Exercise Prescription with Erin Morrison . Erin Morrison is not currently exercising at home.  The patient was advised to walk 2 days a week for 30 minutes (broken up into 15 minute intervals if needed).  Erin Morrison and I discussed how to progress their exercise prescription.  The patient stated that their goals were to lose 20 pounds and decrease shortness of breath.  The patient stated that they understand the exercise prescription.  We reviewed exercise guidelines, target heart rate during exercise, oxygen use, weather, home pulse oximeter, endpoints for exercise, and goals.  Patient is encouraged to come to me with any questions. I will continue to follow up with the patient to assist them with progression and safety.

## 2016-12-28 NOTE — Progress Notes (Signed)
Pt in office for POC eval.  

## 2016-12-28 NOTE — Progress Notes (Signed)
Daily Session Note  Patient Details  Name: Erin Morrison MRN: [id number] Date of Birth: April 16, 1950 Referring Provider:     Pulmonary Rehab Walk Test from 11/28/2016 in [place]  Referring Provider  Dr. [name]      Encounter Date: 12/28/2016  Check In: Session Check In - 12/28/16 1320      Check-In   Location  [facility]    Staff Present  Trish Fountain, RN, BSN;Amber Fair, MS, ACSM RCEP, Exercise Physiologist;Molly diVincenzo, MS, ACSM RCEP, Exercise Physiologist;[name] Leonia Reeves, RN, Roque Cash, RN    Supervising physician immediately available to respond to emergencies  [facility] immediately available    Physician(s)  Dr. Nevada Crane    Medication changes reported      No    Fall or balance concerns reported     No    Tobacco Cessation  No Change    Warm-up and Cool-down  Performed as group-led instruction    Resistance Training Performed  Yes    VAD Patient?  No      Pain Assessment   Currently in Pain?  No/denies    Multiple Pain Sites  No       Capillary Blood Glucose: No results found for this or any previous visit (from the past 24 hour(s)).  Exercise Prescription Changes - 12/28/16 1300      Response to Exercise   Blood Pressure (Admit)  140/70    Blood Pressure (Exercise)  136/64    Blood Pressure (Exit)  116/80    Heart Rate (Admit)  80 bpm    Heart Rate (Exercise)  105 bpm    Heart Rate (Exit)  91 bpm    Oxygen Saturation (Admit)  94 %    Oxygen Saturation (Exercise)  96 %    Oxygen Saturation (Exit)  95 %    Rating of Perceived Exertion (Exercise)  15    Perceived Dyspnea (Exercise)  3    Duration  Progress to 45 minutes of aerobic exercise without signs/symptoms of physical distress    Intensity  THRR unchanged      Progression   Progression  Continue to progress workloads to maintain intensity without signs/symptoms of physical distress.      Resistance Training   Training Prescription  Yes     Weight  orange bands    Reps  10-15    Time  10 Minutes      Oxygen   Oxygen  Continuous    Liters  2      NuStep   Level  3    Minutes  17      Track   Laps  6    Minutes  17       Social History   Tobacco Use  Smoking Status Former Smoker  . Packs/day: 1.50  . Years: 46.00  . Pack years: 69.00  . Types: Cigarettes  . Start date: 01/31/1964  . Last attempt to quit: 01/30/2010  . Years since quitting: 6.9  Smokeless Tobacco Never Used    Goals Met:  Exercise tolerated well No report of cardiac concerns or symptoms Strength training completed today  Goals Unmet:  Not Applicable  Comments: Service time is from 1030 to 1235    Dr. [name] is Medical Director for Pulmonary Rehab at [facility].

## 2016-12-29 NOTE — Progress Notes (Signed)
Erin Morrison 66 y.o. female   DOB: 12/10/1950 MRN: [id number]          Nutrition 1. Chronic obstructive pulmonary disease, unspecified COPD type (HCC)    Past Medical History:  Diagnosis Date  . Acute DVT (deep venous thrombosis) (HCC)   . Allergic rhinitis   . Atrophic vaginitis   . COPD (chronic obstructive pulmonary disease) (HCC)   . Hypertension   . Low back pain   . Umbilical hernia    Meds reviewed. Prednisone noted  Ht: Ht Readings from Last 1 Encounters:  12/01/16 5\' 2"  (1.575 m)   Wt:  Wt Readings from Last 3 Encounters:  12/28/16 185 lb 13.6 oz (84.3 kg)  12/26/16 184 lb 1.4 oz (83.5 kg)  12/19/16 184 lb 15.5 oz (83.9 kg)    BMI: 32.1    Current tobacco use? No  Labs:  Lipid Panel     Component Value Date/Time   CHOL 124 11/27/2016 1207   TRIG 83 11/27/2016 1207   HDL 55 11/27/2016 1207   CHOLHDL 2.3 11/27/2016 1207   CHOLHDL 3.6 12/04/2015 0511   VLDL 16 12/04/2015 0511   LDLCALC 52 11/27/2016 1207    Lab Results  Component Value Date   HGBA1C 5.5 12/04/2015    Nutrition Diagnosis ? Food-and nutrition-related knowledge deficit related to lack of exposure to information as related to diagnosis of pulmonary disease ? Obesity related to excessive energy intake as evidenced by a BMI of 32.1  Goal(s) 1. Identify food quantities necessary to achieve wt loss of  -2# per week to a goal wt loss of 6-24 lb at graduation from pulmonary rehab. 2. Describe the benefit of including fruits, vegetables, whole grains, and low-fat dairy products in a healthy meal plan.  Plan:  Pt to attend Pulmonary Nutrition class Will provide client-centered nutrition education as part of interdisciplinary care.   Monitor and evaluate progress toward nutrition goal with team.  Monitor and Evaluate progress toward nutrition goal with team.   Mickle PlumbEdna Kansas Spainhower, M.Ed, RD, LDN, CDE 12/29/2016 1:42 PM

## 2017-01-01 ENCOUNTER — Other Ambulatory Visit: Payer: Self-pay | Admitting: Pulmonary Disease

## 2017-01-01 ENCOUNTER — Encounter: Payer: Self-pay | Admitting: Pulmonary Disease

## 2017-01-01 ENCOUNTER — Ambulatory Visit (INDEPENDENT_AMBULATORY_CARE_PROVIDER_SITE_OTHER)
Admission: RE | Admit: 2017-01-01 | Discharge: 2017-01-01 | Disposition: A | Discharge status: Home / Self Care | Payer: Managed Care, Other (non HMO) | Source: Ambulatory Visit | Attending: Pulmonary Disease | Admitting: Pulmonary Disease

## 2017-01-01 ENCOUNTER — Ambulatory Visit: Payer: Managed Care, Other (non HMO) | Admitting: Pulmonary Disease

## 2017-01-01 VITALS — BP 136/72 | HR 68 | Ht 62.0 in | Wt 184.0 lb

## 2017-01-01 DIAGNOSIS — J449 Chronic obstructive pulmonary disease, unspecified: Secondary | ICD-10-CM

## 2017-01-01 DIAGNOSIS — J986 Disorders of diaphragm: Secondary | ICD-10-CM

## 2017-01-01 HISTORY — DX: Disorders of diaphragm: J98.6

## 2017-01-01 NOTE — Assessment & Plan Note (Signed)
chest x-ray today to reassess diaphragm If remains elevated then will consider sniff test and perhaps sleep study to assess whether CPAP would help

## 2017-01-01 NOTE — Assessment & Plan Note (Signed)
Stay on current medications including 10 mg of prednisone She feels lonhala works better & will stay on it Prescription to DME for portable concentrator-2 L pulse  Reassess after you complete pulmonary rehab

## 2017-01-01 NOTE — Progress Notes (Signed)
   Subjective:    Patient ID: Erin Morrison, female    DOB: 07/22/1950, 66 y.o.   MRN: [id number]  HPI  66 year old with moderate COPD  She has maintained on a regimen of Pulmicort, Perforomist and Spiriva and [facility] She remains dependent on 10 mg of prednisone since 2016  She is 4 weeks into pulmonary rehab but complains of persistent shortness of breath.  She is able to walk in the parking lot but not able to walk around the store Denies pedal edema or chest pain.  Compliant with medications. Feels that non-hollow work slightly better than Spiriva and wishes to stay on it  Chest x-ray from 12/2015 shows elevated right hemidiaphragm which was not present on x-ray a month prior to that    Significant tests/ events reviewed  PFT 07/13/15: FVC 2.09 L (73%) FEV1 1.45 L (66%) ratio 69 sig bronchodilator response DLCO uncorrected 62%  Alpha-1 antitrypsin: MM (169)  Past Medical History:  Diagnosis Date  . Acute DVT (deep venous thrombosis) (HCC)   . Allergic rhinitis   . Atrophic vaginitis   . COPD (chronic obstructive pulmonary disease) (HCC)   . Hypertension   . Low back pain   . Umbilical hernia      Review of Systems neg for any significant sore throat, dysphagia, itching, sneezing, nasal congestion or excess/ purulent secretions, fever, chills, sweats, unintended wt loss, pleuritic or exertional cp, hempoptysis, orthopnea pnd or change in chronic leg swelling. Also denies presyncope, palpitations, heartburn, abdominal pain, nausea, vomiting, diarrhea or change in bowel or urinary habits, dysuria,hematuria, rash, arthralgias, visual complaints, headache, numbness weakness or ataxia.     Objective:   Physical Exam   Gen. Pleasant, obese, in no distress ENT - no lesions, no post nasal drip Neck: No JVD, no thyromegaly, no carotid bruits Lungs: no use of accessory muscles, no dullness to percussion, decreased without rales or rhonchi    Cardiovascular: Rhythm regular, heart sounds  normal, no murmurs or gallops, no peripheral edema Musculoskeletal: No deformities, no cyanosis or clubbing , no tremors        Assessment & Plan:

## 2017-01-01 NOTE — Patient Instructions (Addendum)
Stay on current medications. Prescription to DME for portable concentrator-2 L pulse  Reassess after you complete pulmonary rehab   chest x-ray today to reassess diaphragm

## 2017-01-02 ENCOUNTER — Encounter (HOSPITAL_COMMUNITY)
Admission: RE | Admit: 2017-01-02 | Discharge: 2017-01-02 | Disposition: A | Discharge status: Home / Self Care | Payer: Managed Care, Other (non HMO) | Source: Ambulatory Visit | Attending: Pulmonary Disease | Admitting: Pulmonary Disease

## 2017-01-02 ENCOUNTER — Telehealth (HOSPITAL_COMMUNITY): Payer: Self-pay | Admitting: Physician Assistant

## 2017-01-02 DIAGNOSIS — Z86718 Personal history of other venous thrombosis and embolism: Secondary | ICD-10-CM | POA: Insufficient documentation

## 2017-01-02 DIAGNOSIS — I10 Essential (primary) hypertension: Secondary | ICD-10-CM | POA: Insufficient documentation

## 2017-01-02 DIAGNOSIS — J449 Chronic obstructive pulmonary disease, unspecified: Secondary | ICD-10-CM | POA: Insufficient documentation

## 2017-01-02 DIAGNOSIS — Z87891 Personal history of nicotine dependence: Secondary | ICD-10-CM | POA: Insufficient documentation

## 2017-01-02 DIAGNOSIS — Z79899 Other long term (current) drug therapy: Secondary | ICD-10-CM | POA: Insufficient documentation

## 2017-01-02 DIAGNOSIS — M545 Low back pain: Secondary | ICD-10-CM | POA: Insufficient documentation

## 2017-01-04 ENCOUNTER — Encounter (HOSPITAL_COMMUNITY)
Admission: RE | Admit: 2017-01-04 | Discharge: 2017-01-04 | Disposition: A | Discharge status: Home / Self Care | Payer: Managed Care, Other (non HMO) | Source: Ambulatory Visit | Attending: Pulmonary Disease | Admitting: Pulmonary Disease

## 2017-01-04 VITALS — Wt 184.3 lb

## 2017-01-04 DIAGNOSIS — I10 Essential (primary) hypertension: Secondary | ICD-10-CM | POA: Diagnosis not present

## 2017-01-04 DIAGNOSIS — Z86718 Personal history of other venous thrombosis and embolism: Secondary | ICD-10-CM | POA: Diagnosis not present

## 2017-01-04 DIAGNOSIS — Z79899 Other long term (current) drug therapy: Secondary | ICD-10-CM | POA: Diagnosis not present

## 2017-01-04 DIAGNOSIS — J449 Chronic obstructive pulmonary disease, unspecified: Secondary | ICD-10-CM

## 2017-01-04 DIAGNOSIS — M545 Low back pain: Secondary | ICD-10-CM | POA: Diagnosis not present

## 2017-01-04 DIAGNOSIS — Z87891 Personal history of nicotine dependence: Secondary | ICD-10-CM | POA: Diagnosis not present

## 2017-01-04 NOTE — Progress Notes (Signed)
Erin Morrison 66 y.o. female   DOB: 06/22/1950 MRN: [id number]          Nutrition 1. Chronic obstructive pulmonary disease, unspecified COPD type (HCC)    Meds reviewed. Prednisone noted Note Spoke with pt. There are many ways pt can improve her eating habits. Pt's Rate Your Plate results reviewed with pt. Barrier to making healthier choices include pt's husband does all of the grocery shopping and "he buys what he wants" and pt has a decreased appetite/does not want to eat. Despite not wanting to eat, pt makes herself eat. Pt c/o 17 lb wt gain over the past year. Pt taking prednisone chronically, but is not taking calcium/vitamin D. Pt expressed understanding of the information reviewed via feedback method.    Nutrition Diagnosis ? Food-and nutrition-related knowledge deficit related to lack of exposure to information as related to diagnosis of pulmonary disease ? Obesity related to excessive energy intake as evidenced by a BMI of 32.1  Nutrition Intervention ? Pt's individual nutrition plan and goals reviewed with pt. ? Benefits of adopting healthy eating habits discussed when pt's Rate Your Plate reviewed. ? Pt to add 600 mg Calcium Citrate supplement with vitamin D BID with meals.  ? Pt to attend the Nutrition and Lung Disease class  Goal(s) 1. Identify food quantities necessary to achieve wt loss of  -2# per week to a goal wt loss of 6-24 lb at graduation from pulmonary rehab. 2. Describe the benefit of including fruits, vegetables, whole grains, and low-fat dairy products in a healthy meal plan.  Plan:  Pt to attend Pulmonary Nutrition class Will provide client-centered nutrition education as part of interdisciplinary care.   Monitor and evaluate progress toward nutrition goal with team.  Monitor and Evaluate progress toward nutrition goal with team.   Erin Morrison, M.Ed, RD, LDN, CDE 01/04/2017 12:22 PM

## 2017-01-04 NOTE — Progress Notes (Signed)
Daily Session Note  Patient Details  Name: MARCENA DIAS MRN: [id number] Date of Birth: Aug 22, 1950 Referring Provider:     Pulmonary Rehab Walk Test from 11/28/2016 in [place]  Referring Provider  Dr. [name]      Encounter Date: 01/04/2017  Check In: Session Check In - 01/04/17 1055      Check-In   Location  [facility]    Staff Present  Rodney Langton, RN;[name], RN, Maxcine Ham, RN, BSN;Ember Henrikson, MS, ACSM RCEP, Exercise Physiologist    Supervising physician immediately available to respond to emergencies  [facility] immediately available    Physician(s)  Dr. Starla Link    Medication changes reported      No    Fall or balance concerns reported     No    Tobacco Cessation  No Change    Warm-up and Cool-down  Performed as group-led instruction    Resistance Training Performed  Yes    VAD Patient?  No      Pain Assessment   Currently in Pain?  No/denies    Multiple Pain Sites  No       Capillary Blood Glucose: No results found for this or any previous visit (from the past 24 hour(s)).  Exercise Prescription Changes - 01/04/17 1200      Response to Exercise   Blood Pressure (Admit)  116/74    Blood Pressure (Exercise)  124/70    Blood Pressure (Exit)  120/80    Heart Rate (Admit)  93 bpm    Heart Rate (Exercise)  127 bpm    Heart Rate (Exit)  95 bpm    Oxygen Saturation (Admit)  94 %    Oxygen Saturation (Exercise)  95 %    Oxygen Saturation (Exit)  94 %    Rating of Perceived Exertion (Exercise)  13    Perceived Dyspnea (Exercise)  2    Duration  Progress to 45 minutes of aerobic exercise without signs/symptoms of physical distress    Intensity  THRR unchanged      Progression   Progression  Continue to progress workloads to maintain intensity without signs/symptoms of physical distress.      Resistance Training   Training Prescription  Yes    Weight  orange bands    Reps  10-15    Time   10 Minutes      Oxygen   Oxygen  Continuous    Liters  2      NuStep   Level  3    Minutes  17    METs  1.9      Track   Laps  12    Minutes  17       Social History   Tobacco Use  Smoking Status Former Smoker  . Packs/day: 1.50  . Years: 46.00  . Pack years: 69.00  . Types: Cigarettes  . Start date: 01/31/1964  . Last attempt to quit: 01/30/2010  . Years since quitting: 6.9  Smokeless Tobacco Never Used    Goals Met:  Exercise tolerated well No report of cardiac concerns or symptoms Strength training completed today  Goals Unmet:  Not Applicable  Comments: Service time is from 10:30a to 12:30p    Dr. [name] is Medical Director for Pulmonary Rehab at [facility].

## 2017-01-04 NOTE — Progress Notes (Signed)
Pulmonary Individual Treatment Plan  Patient Details  Name: Erin Morrison MRN: [id number] Date of Birth: 08/14/1950 Referring Provider:     Pulmonary Rehab Walk Test from 11/28/2016 in [place]  Referring Provider  Dr. [name]      Initial Encounter Date:    Pulmonary Rehab Walk Test from 11/28/2016 in [place]  Date  11/28/16  Referring Provider  Dr. [name]      Visit Diagnosis: Chronic obstructive pulmonary disease, unspecified COPD type ([place])  Patient's Home Medications on Admission:   Current Outpatient Medications:  .  acetaminophen (TYLENOL) 500 MG tablet, Take 500 mg by mouth 2 (two) times daily as needed for moderate pain or headache., Disp: , Rfl:  .  albuterol (PROVENTIL HFA;VENTOLIN HFA) 108 (90 Base) MCG/ACT inhaler, Inhale 2 puffs into the lungs every 6 (six) hours as needed for wheezing or shortness of breath. , Disp: , Rfl:  .  albuterol (PROVENTIL) (2.5 MG/3ML) 0.083% nebulizer solution, Take 2.5 mg by nebulization every 6 (six) hours as needed for wheezing or shortness of breath., Disp: , Rfl:  .  aspirin 81 MG tablet, Take 81 mg by mouth daily., Disp: , Rfl:  .  atorvastatin (LIPITOR) 40 MG tablet, Take 1 tablet (40 mg total) by mouth daily at 6 PM., Disp: 90 tablet, Rfl: 3 .  budesonide (PULMICORT) 0.5 MG/2ML nebulizer solution, USE 1 VIAL VIA NEBULIZER 2 TIMES DAILY -DX CODE J44.9, Disp: 120 mL, Rfl: 3 .  cetirizine (ZYRTEC) 10 MG tablet, Take 10 mg by mouth daily., Disp: , Rfl:  .  DALIRESP 500 MCG TABS tablet, TAKE 1 TABLET BY MOUTH DAILY., Disp: 30 tablet, Rfl: 3 .  esomeprazole (NEXIUM) 40 MG capsule, Take 40 mg by mouth daily as needed (for acid reflex). , Disp: , Rfl:  .  Glycopyrrolate (LONHALA MAGNAIR STARTER KIT) 25 MCG/ML SOLN, Inhale 25 mcg into the lungs 2 (two) times daily., Disp: 1 mL, Rfl: 6 .  isosorbide mononitrate (IMDUR) 30 MG 24 hr tablet, Take 0.5 tablets (15 mg total) by  mouth daily., Disp: 45 tablet, Rfl: 2 .  montelukast (SINGULAIR) 10 MG tablet, TAKE 1 TABLET BY MOUTH AT BEDTIME, Disp: 30 tablet, Rfl: 3 .  nitroGLYCERIN (NITROSTAT) 0.4 MG SL tablet, Place 1 tablet (0.4 mg total) under the tongue every 5 (five) minutes as needed for chest pain., Disp: 90 tablet, Rfl: 3 .  PERFOROMIST 20 MCG/2ML nebulizer solution, USE 1 VIAL VIA NEBULIZER TWICE A DAY, Disp: 120 mL, Rfl: 3 .  Polyethyl Glycol-Propyl Glycol (SYSTANE OP), Apply 1-2 drops to eye daily as needed (dry eyes)., Disp: , Rfl:  .  predniSONE (DELTASONE) 10 MG tablet, TAKE 1 TABLET BY MOUTH DAILY WITH BREAKFAST., Disp: 30 tablet, Rfl: 1 .  Spacer/Aero-Holding Chambers (AEROCHAMBER MV) inhaler, Use as instructed, Disp: 1 each, Rfl: 0  Past Medical History: Past Medical History:  Diagnosis Date  . Acute DVT (deep venous thrombosis) ([place])   . Allergic rhinitis   . Atrophic vaginitis   . COPD (chronic obstructive pulmonary disease) ([place])   . Hypertension   . Low back pain   . Umbilical hernia     Tobacco Use: Social History   Tobacco Use  Smoking Status Former Smoker  . Packs/day: 1.50  . Years: 46.00  . Pack years: 69.00  . Types: Cigarettes  . Start date: 01/31/1964  . Last attempt to quit: 01/30/2010  . Years since quitting: 6.9  Smokeless Tobacco  Never Used    Labs: Recent Review Flowsheet Data    Labs for ITP Cardiac and Pulmonary Rehab Latest Ref Rng & Units 12/04/2015 03/03/2016 03/03/2016 04/05/2016 11/27/2016   Cholestrol 100 - 199 mg/dL 207(H) - - 138 124   LDLCALC 0 - 99 mg/dL 134(H) - - 55 52   HDL >39 mg/dL 57 - - 60 55   Trlycerides 0 - 149 mg/dL 79 - - 115 83   Hemoglobin A1c 4.8 - 5.6 % 5.5 - - - -   PHART 7.350 - 7.450 - - 7.419 - -   PCO2ART 32.0 - 48.0 mmHg - - 34.0 - -   HCO3 20.0 - 28.0 mmol/L - 22.3 22.0 - -   TCO2 0 - 100 mmol/L - 23 23 - -   ACIDBASEDEF 0.0 - 2.0 mmol/L - 2.0 2.0 - -   O2SAT % - 65.0 94.0 - -      Capillary Blood Glucose: No results found for:  GLUCAP   Pulmonary Assessment Scores: Pulmonary Assessment Scores    Row Name 11/30/16 0908 12/05/16 1610       ADL UCSD   ADL Phase  Entry  Entry    SOB Score total  -  60      CAT Score   CAT Score  -  29 Entry      mMRC Score   mMRC Score  3  -       Pulmonary Function Assessment: Pulmonary Function Assessment - 11/27/16 1029      Post Bronchodilator Spirometry Results   FEV1%  69 %    FEV1/FVC Ratio  66      Breath   Bilateral Breath Sounds  Clear    Shortness of Breath  Panic with Shortness of Breath;Limiting activity;Yes       Exercise Target Goals:    Exercise Program Goal: Individual exercise prescription set with THRR, safety & activity barriers. Participant demonstrates ability to understand and report RPE using BORG scale, to self-measure pulse accurately, and to acknowledge the importance of the exercise prescription.  Exercise Prescription Goal: Starting with aerobic activity 30 plus minutes a day, 3 days per week for initial exercise prescription. Provide home exercise prescription and guidelines that participant acknowledges understanding prior to discharge.  Activity Barriers & Risk Stratification: Activity Barriers & Cardiac Risk Stratification - 11/27/16 1033      Activity Barriers & Cardiac Risk Stratification   Activity Barriers  -- Occassionally has bilateral hip pain       6 Minute Walk: 6 Minute Walk    Row Name 11/30/16 0859         6 Minute Walk   Phase  Initial     Distance  1300 feet     Walk Time  6 minutes     # of Rest Breaks  0     MPH  2.46     METS  2.91     RPE  13     Perceived Dyspnea   1     Symptoms  Yes (comment)     Comments  wheelchair     Resting HR  92 bpm     Resting BP  134/82     Resting Oxygen Saturation   93 %     Exercise Oxygen Saturation  during 6 min walk  85 %     Max Ex. HR  113 bpm     Max Ex. BP  150/72  Interval HR   1 Minute HR  94     2 Minute HR  93     3 Minute HR  100     4  Minute HR  112     5 Minute HR  113     6 Minute HR  112     2 Minute Post HR  96     Interval Heart Rate?  Yes       Interval Oxygen   Interval Oxygen?  Yes     Baseline Oxygen Saturation %  93 %     1 Minute Oxygen Saturation %  92 %     1 Minute Liters of Oxygen  0 L     2 Minute Oxygen Saturation %  85 %     2 Minute Liters of Oxygen  0 L     3 Minute Oxygen Saturation %  93 %     3 Minute Liters of Oxygen  2 L     4 Minute Oxygen Saturation %  96 %     4 Minute Liters of Oxygen  2 L     5 Minute Oxygen Saturation %  93 %     5 Minute Liters of Oxygen  2 L     6 Minute Oxygen Saturation %  91 %     6 Minute Liters of Oxygen  2 L     2 Minute Post Oxygen Saturation %  96 %     2 Minute Post Liters of Oxygen  2 L        Oxygen Initial Assessment: Oxygen Initial Assessment - 11/30/16 0907      Initial 6 min Walk   Oxygen Used  Continuous;E-Tanks    Liters per minute  2      Program Oxygen Prescription   Program Oxygen Prescription  Continuous;E-Tanks    Liters per minute  2    Comments  started off on room air-85%-titrated to 2 liters       Oxygen Re-Evaluation: Oxygen Re-Evaluation    Row Name 12/12/16 0957 01/04/17 0950           Program Oxygen Prescription   Program Oxygen Prescription  Continuous;E-Tanks  Continuous;E-Tanks      Liters per minute  2  2      Comments  2 liters is sufficient at this time while exercising  2 liters is sufficient at this time while exercising        Home Oxygen   Home Oxygen Device  Home Concentrator;E-Tanks  Home Concentrator;E-Tanks      Sleep Oxygen Prescription  Continuous  Continuous      Liters per minute  3  2      Home Exercise Oxygen Prescription  Continuous  Continuous      Liters per minute  2  2      Home at Rest Exercise Oxygen Prescription  Continuous  Continuous      Liters per minute  2  2      Compliance with Home Oxygen Use  Yes  Yes        Goals/Expected Outcomes   Short Term Goals  To learn and  exhibit compliance with exercise, home and travel O2 prescription  To learn and exhibit compliance with exercise, home and travel O2 prescription      Long  Term Goals  Exhibits compliance with exercise, home and travel O2 prescription  Exhibits compliance with exercise, home and  travel O2 prescription      Comments  Just started program, 2 liters is sufficient while exercising.  compliant with oxygen use      Goals/Expected Outcomes  Continue compliance with oxygen useage  continued compliance         Oxygen Discharge (Final Oxygen Re-Evaluation): Oxygen Re-Evaluation - 01/04/17 0950      Program Oxygen Prescription   Program Oxygen Prescription  Continuous;E-Tanks    Liters per minute  2    Comments  2 liters is sufficient at this time while exercising      Home Oxygen   Home Oxygen Device  Home Concentrator;E-Tanks    Sleep Oxygen Prescription  Continuous    Liters per minute  2    Home Exercise Oxygen Prescription  Continuous    Liters per minute  2    Home at Rest Exercise Oxygen Prescription  Continuous    Liters per minute  2    Compliance with Home Oxygen Use  Yes      Goals/Expected Outcomes   Short Term Goals  To learn and exhibit compliance with exercise, home and travel O2 prescription    Long  Term Goals  Exhibits compliance with exercise, home and travel O2 prescription    Comments  compliant with oxygen use    Goals/Expected Outcomes  continued compliance       Initial Exercise Prescription: Initial Exercise Prescription - 11/30/16 0900      Date of Initial Exercise RX and Referring Provider   Date  11/28/16    Referring Provider  Dr. [name]      Oxygen   Oxygen  Continuous    Liters  2      Recumbant Bike   Level  1    Watts  15    Minutes  17      NuStep   Level  1    Minutes  17    METs  1.4      Track   Laps  10    Minutes  17      Prescription Details   Frequency (times per week)  2    Duration  Progress to 45 minutes of aerobic  exercise without signs/symptoms of physical distress      Intensity   THRR 40-80% of Max Heartrate  61-122    Ratings of Perceived Exertion  11-13    Perceived Dyspnea  0-4      Progression   Progression  Continue progressive overload as per policy without signs/symptoms or physical distress.      Resistance Training   Training Prescription  Yes    Weight  orange bands    Reps  10-15       Perform Capillary Blood Glucose checks as needed.  Exercise Prescription Changes: Exercise Prescription Changes    Row Name 12/05/16 1500 12/07/16 1300 12/12/16 1500 12/19/16 1500 12/26/16 1500     Response to Exercise   Blood Pressure (Admit)  114/64  132/70  110/50  124/62  100/60   Blood Pressure (Exercise)  132/64  210/100 recovered to 130/74  120/66  120/66  160/90   Blood Pressure (Exit)  120/70  122/70  104/60  108/60  132/66   Heart Rate (Admit)  86 bpm  83 bpm  77 bpm  88 bpm  80 bpm   Heart Rate (Exercise)  102 bpm  104 bpm  122 bpm  125 bpm  115 bpm   Heart Rate (Exit)  79 bpm  84 bpm  90 bpm  75 bpm  88 bpm   Oxygen Saturation (Admit)  95 %  95 %  96 %  95 %  95 %   Oxygen Saturation (Exercise)  96 %  96 %  94 %  94 %  95 %   Oxygen Saturation (Exit)  94 %  96 %  94 %  96 %  92 %   Rating of Perceived Exertion (Exercise)  _0 Perceived Dyspnea (Exercise)  _1 Duration  Progress to 45 minutes of aerobic exercise without signs/symptoms of physical distress  Progress to 45 minutes of aerobic exercise without signs/symptoms of physical distress  Progress to 45 minutes of aerobic exercise without signs/symptoms of physical distress  Progress to 45 minutes of aerobic exercise without signs/symptoms of physical distress  Progress to 45 minutes of aerobic exercise without signs/symptoms of physical distress   Intensity  Other (comment) 40-80% of HRR  Other (comment) 40-80% of HRR  THRR unchanged  THRR unchanged  THRR unchanged     Progression   Progression   Continue to progress workloads to maintain intensity without signs/symptoms of physical distress.  Continue to progress workloads to maintain intensity without signs/symptoms of physical distress.  Continue to progress workloads to maintain intensity without signs/symptoms of physical distress.  Continue to progress workloads to maintain intensity without signs/symptoms of physical distress.  Continue to progress workloads to maintain intensity without signs/symptoms of physical distress.     Resistance Training   Training Prescription  Yes  Yes  Yes  Yes  Yes   Weight  orange bands  orange bands  orange bands  orange bands  orange bands   Reps  10-15  10-15  10-15  10-15  10-15   Time  10 Minutes  10 Minutes  10 Minutes  10 Minutes  10 Minutes     Oxygen   Oxygen  Continuous  Continuous  Continuous  Continuous  Continuous   Liters  _2 Recumbant Bike   Level  -  _3 Watts  -  15  -  -  -   Minutes  -  _4 NuStep   Level  _5 Minutes  51  _6 METs  2  1.9  2.1  -  -     Track   Laps  -  -  _7 Minutes  -  -  _8 Home Exercise Plan   Plans to continue exercise at  -  -  -  -  Home (comment)   Frequency  -  -  -  -  Add 2 additional days to program exercise sessions.   [place] Name 12/28/16 1300             Response to Exercise   Blood Pressure (Admit)  140/70       Blood Pressure (Exercise)  136/64       Blood Pressure (Exit)  116/80       Heart Rate (Admit)  80 bpm  Heart Rate (Exercise)  105 bpm       Heart Rate (Exit)  91 bpm       Oxygen Saturation (Admit)  94 %       Oxygen Saturation (Exercise)  96 %       Oxygen Saturation (Exit)  95 %       Rating of Perceived Exertion (Exercise)  15       Perceived Dyspnea (Exercise)  3       Duration  Progress to 45 minutes of aerobic exercise without signs/symptoms of physical distress       Intensity  THRR unchanged         Progression    Progression  Continue to progress workloads to maintain intensity without signs/symptoms of physical distress.         Resistance Training   Training Prescription  Yes       Weight  orange bands       Reps  10-15       Time  10 Minutes         Oxygen   Oxygen  Continuous       Liters  2         NuStep   Level  3       Minutes  17         Track   Laps  6       Minutes  17          Exercise Comments: Exercise Comments    Row Name 12/28/16 0717           Exercise Comments  Home exercise completed          Exercise Goals and Review:   Exercise Goals Re-Evaluation : Exercise Goals Re-Evaluation    Row Name 12/12/16 0806 01/01/17 1614           Exercise Goal Re-Evaluation   Exercise Goals Review  Increase Physical Activity;Increase Strength and Stamina;Able to understand and use Dyspnea scale;Able to understand and use rate of perceived exertion (RPE) scale;Knowledge and understanding of Target Heart Rate Range (THRR);Understanding of Exercise Prescription  Increase Physical Activity;Increase Strength and Stamina;Able to understand and use Dyspnea scale;Able to understand and use rate of perceived exertion (RPE) scale;Knowledge and understanding of Target Heart Rate Range (THRR);Understanding of Exercise Prescription      Comments  Patient has only attended 2 exercise sessions. Will cont. to monitor and progress as able.   Patient is making slow and steady gains. Patient states that she can see an improvement. Will cont. to monitor and progress as able.       Expected Outcomes  Through exercise at rehab and at home, patient will increase strength and stamina and find that ADL's are easier to preform.   Through exercise at rehab and at home, patient will increase strength and stamina and find that ADL's are easier to preform.          Discharge Exercise Prescription (Final Exercise Prescription Changes): Exercise Prescription Changes - 12/28/16 1300      Response to  Exercise   Blood Pressure (Admit)  140/70    Blood Pressure (Exercise)  136/64    Blood Pressure (Exit)  116/80    Heart Rate (Admit)  80 bpm    Heart Rate (Exercise)  105 bpm    Heart Rate (Exit)  91 bpm    Oxygen Saturation (Admit)  94 %    Oxygen Saturation (Exercise)  96 %  Oxygen Saturation (Exit)  95 %    Rating of Perceived Exertion (Exercise)  15    Perceived Dyspnea (Exercise)  3    Duration  Progress to 45 minutes of aerobic exercise without signs/symptoms of physical distress    Intensity  THRR unchanged      Progression   Progression  Continue to progress workloads to maintain intensity without signs/symptoms of physical distress.      Resistance Training   Training Prescription  Yes    Weight  orange bands    Reps  10-15    Time  10 Minutes      Oxygen   Oxygen  Continuous    Liters  2      NuStep   Level  3    Minutes  17      Track   Laps  6    Minutes  17       Nutrition:  Target Goals: Understanding of nutrition guidelines, daily intake of sodium <1535m, cholesterol <20107m calories 30% from fat and 7% or less from saturated fats, daily to have 5 or more servings of fruits and vegetables.  Biometrics: Pre Biometrics - 11/27/16 1034      Pre Biometrics   Height  5' 2" (1.575 m)    Weight  184 lb 15.5 oz (83.9 kg)    BMI (Calculated)  33.82    Grip Strength  31 kg        Nutrition Therapy Plan and Nutrition Goals: Nutrition Therapy & Goals - 12/29/16 1347      Nutrition Therapy   Diet  Heart Healthy      Personal Nutrition Goals   Nutrition Goal  Identify food quantities necessary to achieve wt loss of  -2# per week to a goal wt loss of 6-24 lb at graduation from pulmonary rehab.    Personal Goal #2  Describe the benefit of including fruits, vegetables, whole grains, and low-fat dairy products in a healthy meal plan.      Intervention Plan   Intervention  Prescribe, educate and counsel regarding individualized specific dietary  modifications aiming towards targeted core components such as weight, hypertension, lipid management, diabetes, heart failure and other comorbidities.    Expected Outcomes  Short Term Goal: Understand basic principles of dietary content, such as calories, fat, sodium, cholesterol and nutrients.;Long Term Goal: Adherence to prescribed nutrition plan.       Nutrition Discharge: Rate Your Plate Scores: Nutrition Assessments - 12/29/16 1335      Rate Your Plate Scores   Pre Score  33       Nutrition Goals Re-Evaluation:   Nutrition Goals Discharge (Final Nutrition Goals Re-Evaluation):   Psychosocial: Target Goals: Acknowledge presence or absence of significant depression and/or stress, maximize coping skills, provide positive support system. Participant is able to verbalize types and ability to use techniques and skills needed for reducing stress and depression.  Initial Review & Psychosocial Screening: Initial Psych Review & Screening - 11/27/16 1037      Initial Review   Current issues with  None Identified      Family Dynamics   Good Support System?  Yes      Barriers   Psychosocial barriers to participate in program  There are no identifiable barriers or psychosocial needs.      Screening Interventions   Interventions  Encouraged to exercise       Quality of Life Scores:   PHQ-9: Recent Review Flowsheet Data  Depression screen PHQ 2/9 11/27/2016   Decreased Interest 0   Down, Depressed, Hopeless 1   PHQ - 2 Score 1     Interpretation of Total Score  Total Score Depression Severity:  1-4 = Minimal depression, 5-9 = Mild depression, 10-14 = Moderate depression, 15-19 = Moderately severe depression, 20-27 = Severe depression   Psychosocial Evaluation and Intervention: Psychosocial Evaluation - 11/27/16 1037      Psychosocial Evaluation & Interventions   Interventions  Encouraged to exercise with the program and follow exercise prescription    Continue  Psychosocial Services   No Follow up required       Psychosocial Re-Evaluation: Psychosocial Re-Evaluation    [place] Name 12/12/16 1002 01/04/17 0952           Psychosocial Re-Evaluation   Current issues with  None Identified  None Identified      Interventions  Encouraged to attend Pulmonary Rehabilitation for the exercise  Encouraged to attend Pulmonary Rehabilitation for the exercise      Continue Psychosocial Services   No Follow up required  No Follow up required         Psychosocial Discharge (Final Psychosocial Re-Evaluation): Psychosocial Re-Evaluation - 01/04/17 0952      Psychosocial Re-Evaluation   Current issues with  None Identified    Interventions  Encouraged to attend Pulmonary Rehabilitation for the exercise    Continue Psychosocial Services   No Follow up required       Education: Education Goals: Education classes will be provided on a weekly basis, covering required topics. Participant will state understanding/return demonstration of topics presented.  Learning Barriers/Preferences: Learning Barriers/Preferences - 11/27/16 1028      Learning Barriers/Preferences   Learning Barriers  None    Learning Preferences  Group Instruction       Education Topics: Risk Factor Reduction:  -Group instruction that is supported by a PowerPoint presentation. Instructor discusses the definition of a risk factor, different risk factors for pulmonary disease, and how the heart and lungs work together.     Nutrition for Pulmonary Patient:  -Group instruction provided by PowerPoint slides, verbal discussion, and written materials to support subject matter. The instructor gives an explanation and review of healthy diet recommendations, which includes a discussion on weight management, recommendations for fruit and vegetable consumption, as well as protein, fluid, caffeine, fiber, sodium, sugar, and alcohol. Tips for eating when patients are short of breath are  discussed.   Pursed Lip Breathing:  -Group instruction that is supported by demonstration and informational handouts. Instructor discusses the benefits of pursed lip and diaphragmatic breathing and detailed demonstration on how to preform both.     Oxygen Safety:  -Group instruction provided by PowerPoint, verbal discussion, and written material to support subject matter. There is an overview of "What is Oxygen" and "Why do we need it".  Instructor also reviews how to create a safe environment for oxygen use, the importance of using oxygen as prescribed, and the risks of noncompliance. There is a brief discussion on traveling with oxygen and resources the patient may utilize.   Oxygen Equipment:  -Group instruction provided by [facility] Staff utilizing handouts, written materials, and equipment demonstrations.   PULMONARY REHAB CHRONIC OBSTRUCTIVE PULMONARY DISEASE from 12/28/2016 in [place]  Date  12/28/16  Educator  Ace Gins  Instruction Review Code  2- meets goals/outcomes      Signs and Symptoms:  -Group instruction provided by written material and verbal discussion to  support subject matter. Warning signs and symptoms of infection, stroke, and heart attack are reviewed and when to call the physician/911 reinforced. Tips for preventing the spread of infection discussed.   Advanced Directives:  -Group instruction provided by verbal instruction and written material to support subject matter. Instructor reviews Advanced Directive laws and proper instruction for filling out document.   Pulmonary Video:  -Group video education that reviews the importance of medication and oxygen compliance, exercise, good nutrition, pulmonary hygiene, and pursed lip and diaphragmatic breathing for the pulmonary patient.   Exercise for the Pulmonary Patient:  -Group instruction that is supported by a PowerPoint presentation. Instructor discusses benefits of exercise,  core components of exercise, frequency, duration, and intensity of an exercise routine, importance of utilizing pulse oximetry during exercise, safety while exercising, and options of places to exercise outside of rehab.     Pulmonary Medications:  -Verbally interactive group education provided by instructor with focus on inhaled medications and proper administration.   Anatomy and Physiology of the Respiratory System and Intimacy:  -Group instruction provided by PowerPoint, verbal discussion, and written material to support subject matter. Instructor reviews respiratory cycle and anatomical components of the respiratory system and their functions. Instructor also reviews differences in obstructive and restrictive respiratory diseases with examples of each. Intimacy, Sex, and Sexuality differences are reviewed with a discussion on how relationships can change when diagnosed with pulmonary disease. Common sexual concerns are reviewed.   MD DAY -A group question and answer session with a medical doctor that allows participants to ask questions that relate to their pulmonary disease state.   OTHER EDUCATION -Group or individual verbal, written, or video instructions that support the educational goals of the pulmonary rehab program.   PULMONARY REHAB CHRONIC OBSTRUCTIVE PULMONARY DISEASE from 12/28/2016 in [place]  Date  12/07/16  Educator  [employer]  Instruction Review Code  2- Demonstrated Understanding      Knowledge Questionnaire Score: Knowledge Questionnaire Score - 12/05/16 1610      Knowledge Questionnaire Score   Pre Score  11/13       Core Components/Risk Factors/Patient Goals at Admission: Personal Goals and Risk Factors at Admission - 11/27/16 1035      Core Components/Risk Factors/Patient Goals on Admission    Weight Management  Yes    Intervention  Weight Management: Develop a combined nutrition and exercise program designed to reach  desired caloric intake, while maintaining appropriate intake of nutrient and fiber, sodium and fats, and appropriate energy expenditure required for the weight goal.    Admit Weight  184 lb 15.5 oz (83.9 kg)    Goal Weight: Short Term  179 lb (81.2 kg)    Expected Outcomes  Short Term: Continue to assess and modify interventions until short term weight is achieved    Improve shortness of breath with ADL's  Yes    Intervention  Provide education, individualized exercise plan and daily activity instruction to help decrease symptoms of SOB with activities of daily living.    Expected Outcomes  Short Term: Achieves a reduction of symptoms when performing activities of daily living.    Develop more efficient breathing techniques such as purse lipped breathing and diaphragmatic breathing; and practicing self-pacing with activity  Yes    Intervention  Provide education, demonstration and support about specific breathing techniuqes utilized for more efficient breathing. Include techniques such as pursed lipped breathing, diaphragmatic breathing and self-pacing activity.    Expected Outcomes  Short Term: Participant  will be able to demonstrate and use breathing techniques as needed throughout daily activities.    Increase knowledge of respiratory medications and ability to use respiratory devices properly   Yes    Intervention  Provide education and demonstration as needed of appropriate use of medications, inhalers, and oxygen therapy.    Expected Outcomes  Short Term: Achieves understanding of medications use. Understands that oxygen is a medication prescribed by physician. Demonstrates appropriate use of inhaler and oxygen therapy.       Core Components/Risk Factors/Patient Goals Review:  Goals and Risk Factor Review    Row Name 12/12/16 1000 01/04/17 0951           Core Components/Risk Factors/Patient Goals Review   Personal Goals Review  Weight Management/Obesity;Improve shortness of breath with  ADL's;Increase knowledge of respiratory medications and ability to use respiratory devices properly.;Develop more efficient breathing techniques such as purse lipped breathing and diaphragmatic breathing and practicing self-pacing with activity.  Weight Management/Obesity;Improve shortness of breath with ADL's;Increase knowledge of respiratory medications and ability to use respiratory devices properly.;Develop more efficient breathing techniques such as purse lipped breathing and diaphragmatic breathing and practicing self-pacing with activity.      Review  Just started program, has attended 2 exercise sessions, too early to see progression toward goals  no weight loss, but strength and stamina have increased and I see major confidance building      Expected Outcomes  In the next 30 days, should see progression toward goals  Continued improvement towards admission goals         Core Components/Risk Factors/Patient Goals at Discharge (Final Review):  Goals and Risk Factor Review - 01/04/17 0951      Core Components/Risk Factors/Patient Goals Review   Personal Goals Review  Weight Management/Obesity;Improve shortness of breath with ADL's;Increase knowledge of respiratory medications and ability to use respiratory devices properly.;Develop more efficient breathing techniques such as purse lipped breathing and diaphragmatic breathing and practicing self-pacing with activity.    Review  no weight loss, but strength and stamina have increased and I see major confidance building    Expected Outcomes  Continued improvement towards admission goals       ITP Comments:   Comments:ITP REVIEW Pt is making expected progress toward pulmonary rehab goals after completing 6 sessions. Recommend continued exercise, life style modification, education, and utilization of breathing techniques to increase stamina and strength and decrease shortness of breath with exertion.

## 2017-01-09 ENCOUNTER — Encounter (HOSPITAL_COMMUNITY): Payer: Managed Care, Other (non HMO)

## 2017-01-11 ENCOUNTER — Encounter (HOSPITAL_COMMUNITY): Payer: Managed Care, Other (non HMO)

## 2017-01-15 ENCOUNTER — Telehealth: Payer: Self-pay | Admitting: Pulmonary Disease

## 2017-01-15 DIAGNOSIS — J449 Chronic obstructive pulmonary disease, unspecified: Secondary | ICD-10-CM

## 2017-01-15 NOTE — Telephone Encounter (Signed)
Order has been placed. Walking Oximetry sats are still good, walk was done on 12/28/2016. Nothing further was needed.

## 2017-01-16 ENCOUNTER — Encounter (HOSPITAL_COMMUNITY)
Admission: RE | Admit: 2017-01-16 | Discharge: 2017-01-16 | Disposition: A | Discharge status: Home / Self Care | Payer: Managed Care, Other (non HMO) | Source: Ambulatory Visit | Attending: Pulmonary Disease | Admitting: Pulmonary Disease

## 2017-01-16 VITALS — Wt 185.2 lb

## 2017-01-16 DIAGNOSIS — J449 Chronic obstructive pulmonary disease, unspecified: Secondary | ICD-10-CM

## 2017-01-16 NOTE — Progress Notes (Signed)
Daily Session Note  Patient Details  Name: Erin Morrison MRN: [id number] Date of Birth: 1950/09/09 Referring Provider:     Pulmonary Rehab Walk Test from 11/28/2016 in [place]  Referring Provider  Dr. [name]      Encounter Date: 01/16/2017  Check In: Session Check In - 01/16/17 1518      Check-In   Location  [facility]    Staff Present  Rodney Langton, RN;[name], RN, Maxcine Ham, RN, BSN;Crytal Pensinger, MS, ACSM RCEP, Exercise Physiologist    Supervising physician immediately available to respond to emergencies  [facility] immediately available    Physician(s)  Dr. Lucianne Lei    Medication changes reported      No    Fall or balance concerns reported     No    Tobacco Cessation  No Change    Warm-up and Cool-down  Performed as group-led instruction    Resistance Training Performed  Yes    VAD Patient?  No      Pain Assessment   Currently in Pain?  No/denies    Multiple Pain Sites  No       Capillary Blood Glucose: No results found for this or any previous visit (from the past 24 hour(s)).  Exercise Prescription Changes - 01/16/17 1500      Response to Exercise   Blood Pressure (Admit)  134/70    Blood Pressure (Exercise)  188/86    Blood Pressure (Exit)  122/78    Heart Rate (Admit)  81 bpm    Heart Rate (Exercise)  106 bpm    Heart Rate (Exit)  90 bpm    Oxygen Saturation (Admit)  97 %    Oxygen Saturation (Exercise)  96 %    Oxygen Saturation (Exit)  95 %    Rating of Perceived Exertion (Exercise)  15    Perceived Dyspnea (Exercise)  3    Duration  Progress to 45 minutes of aerobic exercise without signs/symptoms of physical distress    Intensity  THRR unchanged      Progression   Progression  Continue to progress workloads to maintain intensity without signs/symptoms of physical distress.      Resistance Training   Training Prescription  Yes    Weight  orange bands    Reps  10-15    Time   10 Minutes      Oxygen   Oxygen  Continuous    Liters  2      Recumbant Bike   Level  2    Minutes  17      NuStep   Level  3    Minutes  17    METs  2.1      Track   Laps  7    Minutes  17       Social History   Tobacco Use  Smoking Status Former Smoker  . Packs/day: 1.50  . Years: 46.00  . Pack years: 69.00  . Types: Cigarettes  . Start date: 01/31/1964  . Last attempt to quit: 01/30/2010  . Years since quitting: 6.9  Smokeless Tobacco Never Used    Goals Met:  Exercise tolerated well No report of cardiac concerns or symptoms Strength training completed today  Goals Unmet:  Not Applicable  Comments: Service time is from 1:30p to 3:15p    Dr. [name] is Medical Director for Pulmonary Rehab at [facility].

## 2017-01-18 ENCOUNTER — Encounter (HOSPITAL_COMMUNITY)
Admission: RE | Admit: 2017-01-18 | Discharge: 2017-01-18 | Disposition: A | Discharge status: Home / Self Care | Payer: Managed Care, Other (non HMO) | Source: Ambulatory Visit | Attending: Pulmonary Disease | Admitting: Pulmonary Disease

## 2017-01-18 VITALS — Wt 184.5 lb

## 2017-01-18 DIAGNOSIS — J449 Chronic obstructive pulmonary disease, unspecified: Secondary | ICD-10-CM | POA: Diagnosis not present

## 2017-01-18 NOTE — Progress Notes (Signed)
Daily Session Note  Patient Details  Name: Erin Morrison MRN: [id number] Date of Birth: 1950/02/29 Referring Provider:     Pulmonary Rehab Walk Test from 11/28/2016 in [place]  Referring Provider  Dr. [name]      Encounter Date: 01/18/2017  Check In: Session Check In - 01/18/17 1115      Check-In   Location  [facility]    Staff Present  Rodney Langton, RN;[name], RN, BSN;Holle Sprick, MS, ACSM RCEP, Exercise Physiologist    Supervising physician immediately available to respond to emergencies  [facility] immediately available    Physician(s)  Dr. Wendee Beavers    Medication changes reported      No    Fall or balance concerns reported     No    Tobacco Cessation  No Change    Warm-up and Cool-down  Performed as group-led instruction    Resistance Training Performed  Yes    VAD Patient?  No      Pain Assessment   Currently in Pain?  No/denies    Multiple Pain Sites  No       Capillary Blood Glucose: No results found for this or any previous visit (from the past 24 hour(s)).  Exercise Prescription Changes - 01/18/17 1200      Response to Exercise   Blood Pressure (Admit)  132/76    Blood Pressure (Exercise)  132/70    Blood Pressure (Exit)  110/64    Heart Rate (Admit)  69 bpm    Heart Rate (Exercise)  134 bpm    Heart Rate (Exit)  84 bpm    Oxygen Saturation (Admit)  98 %    Oxygen Saturation (Exercise)  94 %    Oxygen Saturation (Exit)  95 %    Rating of Perceived Exertion (Exercise)  13    Perceived Dyspnea (Exercise)  3    Duration  Progress to 45 minutes of aerobic exercise without signs/symptoms of physical distress    Intensity  THRR unchanged      Progression   Progression  Continue to progress workloads to maintain intensity without signs/symptoms of physical distress.      Resistance Training   Training Prescription  Yes    Weight  orange bands    Reps  10-15    Time  10 Minutes      Oxygen   Oxygen  Continuous    Liters  2      Recumbant Bike   Level  2    Minutes  17      Track   Laps  11    Minutes  17       Social History   Tobacco Use  Smoking Status Former Smoker  . Packs/day: 1.50  . Years: 46.00  . Pack years: 69.00  . Types: Cigarettes  . Start date: 01/31/1964  . Last attempt to quit: 01/30/2010  . Years since quitting: 6.9  Smokeless Tobacco Never Used    Goals Met:  Exercise tolerated well No report of cardiac concerns or symptoms Strength training completed today  Goals Unmet:  Not Applicable  Comments: Service time is from 10:30a to 12:45p    Dr. [name] is Medical Director for Pulmonary Rehab at [facility].

## 2017-01-25 ENCOUNTER — Encounter (HOSPITAL_COMMUNITY)
Admission: RE | Admit: 2017-01-25 | Discharge: 2017-01-25 | Disposition: A | Discharge status: Home / Self Care | Payer: Managed Care, Other (non HMO) | Source: Ambulatory Visit | Attending: Pulmonary Disease | Admitting: Pulmonary Disease

## 2017-01-25 VITALS — Wt 184.5 lb

## 2017-01-25 DIAGNOSIS — J449 Chronic obstructive pulmonary disease, unspecified: Secondary | ICD-10-CM

## 2017-01-25 NOTE — Progress Notes (Signed)
Pulmonary Individual Treatment Plan  Patient Details  Name: Erin Morrison MRN: [id number] Date of Birth: 08/14/1950 Referring Provider:     Pulmonary Rehab Walk Test from 11/28/2016 in [place]  Referring Provider  Dr. [name]      Initial Encounter Date:    Pulmonary Rehab Walk Test from 11/28/2016 in [place]  Date  11/28/16  Referring Provider  Dr. [name]      Visit Diagnosis: Chronic obstructive pulmonary disease, unspecified COPD type ([place])  Patient's Home Medications on Admission:   Current Outpatient Medications:  .  acetaminophen (TYLENOL) 500 MG tablet, Take 500 mg by mouth 2 (two) times daily as needed for moderate pain or headache., Disp: , Rfl:  .  albuterol (PROVENTIL HFA;VENTOLIN HFA) 108 (90 Base) MCG/ACT inhaler, Inhale 2 puffs into the lungs every 6 (six) hours as needed for wheezing or shortness of breath. , Disp: , Rfl:  .  albuterol (PROVENTIL) (2.5 MG/3ML) 0.083% nebulizer solution, Take 2.5 mg by nebulization every 6 (six) hours as needed for wheezing or shortness of breath., Disp: , Rfl:  .  aspirin 81 MG tablet, Take 81 mg by mouth daily., Disp: , Rfl:  .  atorvastatin (LIPITOR) 40 MG tablet, Take 1 tablet (40 mg total) by mouth daily at 6 PM., Disp: 90 tablet, Rfl: 3 .  budesonide (PULMICORT) 0.5 MG/2ML nebulizer solution, USE 1 VIAL VIA NEBULIZER 2 TIMES DAILY -DX CODE J44.9, Disp: 120 mL, Rfl: 3 .  cetirizine (ZYRTEC) 10 MG tablet, Take 10 mg by mouth daily., Disp: , Rfl:  .  DALIRESP 500 MCG TABS tablet, TAKE 1 TABLET BY MOUTH DAILY., Disp: 30 tablet, Rfl: 3 .  esomeprazole (NEXIUM) 40 MG capsule, Take 40 mg by mouth daily as needed (for acid reflex). , Disp: , Rfl:  .  Glycopyrrolate (LONHALA MAGNAIR STARTER KIT) 25 MCG/ML SOLN, Inhale 25 mcg into the lungs 2 (two) times daily., Disp: 1 mL, Rfl: 6 .  isosorbide mononitrate (IMDUR) 30 MG 24 hr tablet, Take 0.5 tablets (15 mg total) by  mouth daily., Disp: 45 tablet, Rfl: 2 .  montelukast (SINGULAIR) 10 MG tablet, TAKE 1 TABLET BY MOUTH AT BEDTIME, Disp: 30 tablet, Rfl: 3 .  nitroGLYCERIN (NITROSTAT) 0.4 MG SL tablet, Place 1 tablet (0.4 mg total) under the tongue every 5 (five) minutes as needed for chest pain., Disp: 90 tablet, Rfl: 3 .  PERFOROMIST 20 MCG/2ML nebulizer solution, USE 1 VIAL VIA NEBULIZER TWICE A DAY, Disp: 120 mL, Rfl: 3 .  Polyethyl Glycol-Propyl Glycol (SYSTANE OP), Apply 1-2 drops to eye daily as needed (dry eyes)., Disp: , Rfl:  .  predniSONE (DELTASONE) 10 MG tablet, TAKE 1 TABLET BY MOUTH DAILY WITH BREAKFAST., Disp: 30 tablet, Rfl: 1 .  Spacer/Aero-Holding Chambers (AEROCHAMBER MV) inhaler, Use as instructed, Disp: 1 each, Rfl: 0  Past Medical History: Past Medical History:  Diagnosis Date  . Acute DVT (deep venous thrombosis) ([place])   . Allergic rhinitis   . Atrophic vaginitis   . COPD (chronic obstructive pulmonary disease) ([place])   . Hypertension   . Low back pain   . Umbilical hernia     Tobacco Use: Social History   Tobacco Use  Smoking Status Former Smoker  . Packs/day: 1.50  . Years: 46.00  . Pack years: 69.00  . Types: Cigarettes  . Start date: 01/31/1964  . Last attempt to quit: 01/30/2010  . Years since quitting: 6.9  Smokeless Tobacco  Never Used    Labs: Recent Review Flowsheet Data    Labs for ITP Cardiac and Pulmonary Rehab Latest Ref Rng & Units 12/04/2015 03/03/2016 03/03/2016 04/05/2016 11/27/2016   Cholestrol 100 - 199 mg/dL 207(H) - - 138 124   LDLCALC 0 - 99 mg/dL 134(H) - - 55 52   HDL >39 mg/dL 57 - - 60 55   Trlycerides 0 - 149 mg/dL 79 - - 115 83   Hemoglobin A1c 4.8 - 5.6 % 5.5 - - - -   PHART 7.350 - 7.450 - - 7.419 - -   PCO2ART 32.0 - 48.0 mmHg - - 34.0 - -   HCO3 20.0 - 28.0 mmol/L - 22.3 22.0 - -   TCO2 0 - 100 mmol/L - 23 23 - -   ACIDBASEDEF 0.0 - 2.0 mmol/L - 2.0 2.0 - -   O2SAT % - 65.0 94.0 - -      Capillary Blood Glucose: No results found for:  GLUCAP   Pulmonary Assessment Scores: Pulmonary Assessment Scores    Row Name 11/30/16 0908 12/05/16 1610       ADL UCSD   ADL Phase  Entry  Entry    SOB Score total  -  60      CAT Score   CAT Score  -  29 Entry      mMRC Score   mMRC Score  3  -       Pulmonary Function Assessment: Pulmonary Function Assessment - 11/27/16 1029      Post Bronchodilator Spirometry Results   FEV1%  69 %    FEV1/FVC Ratio  66      Breath   Bilateral Breath Sounds  Clear    Shortness of Breath  Panic with Shortness of Breath;Limiting activity;Yes       Exercise Target Goals:    Exercise Program Goal: Individual exercise prescription set with THRR, safety & activity barriers. Participant demonstrates ability to understand and report RPE using BORG scale, to self-measure pulse accurately, and to acknowledge the importance of the exercise prescription.  Exercise Prescription Goal: Starting with aerobic activity 30 plus minutes a day, 3 days per week for initial exercise prescription. Provide home exercise prescription and guidelines that participant acknowledges understanding prior to discharge.  Activity Barriers & Risk Stratification: Activity Barriers & Cardiac Risk Stratification - 11/27/16 1033      Activity Barriers & Cardiac Risk Stratification   Activity Barriers  -- Occassionally has bilateral hip pain       6 Minute Walk: 6 Minute Walk    Row Name 11/30/16 0859         6 Minute Walk   Phase  Initial     Distance  1300 feet     Walk Time  6 minutes     # of Rest Breaks  0     MPH  2.46     METS  2.91     RPE  13     Perceived Dyspnea   1     Symptoms  Yes (comment)     Comments  wheelchair     Resting HR  92 bpm     Resting BP  134/82     Resting Oxygen Saturation   93 %     Exercise Oxygen Saturation  during 6 min walk  85 %     Max Ex. HR  113 bpm     Max Ex. BP  150/72  Interval HR   1 Minute HR  94     2 Minute HR  93     3 Minute HR  100     4  Minute HR  112     5 Minute HR  113     6 Minute HR  112     2 Minute Post HR  96     Interval Heart Rate?  Yes       Interval Oxygen   Interval Oxygen?  Yes     Baseline Oxygen Saturation %  93 %     1 Minute Oxygen Saturation %  92 %     1 Minute Liters of Oxygen  0 L     2 Minute Oxygen Saturation %  85 %     2 Minute Liters of Oxygen  0 L     3 Minute Oxygen Saturation %  93 %     3 Minute Liters of Oxygen  2 L     4 Minute Oxygen Saturation %  96 %     4 Minute Liters of Oxygen  2 L     5 Minute Oxygen Saturation %  93 %     5 Minute Liters of Oxygen  2 L     6 Minute Oxygen Saturation %  91 %     6 Minute Liters of Oxygen  2 L     2 Minute Post Oxygen Saturation %  96 %     2 Minute Post Liters of Oxygen  2 L        Oxygen Initial Assessment: Oxygen Initial Assessment - 11/30/16 0907      Initial 6 min Walk   Oxygen Used  Continuous;E-Tanks    Liters per minute  2      Program Oxygen Prescription   Program Oxygen Prescription  Continuous;E-Tanks    Liters per minute  2    Comments  started off on room air-85%-titrated to 2 liters       Oxygen Re-Evaluation: Oxygen Re-Evaluation    Row Name 12/12/16 0957 01/04/17 0950 01/19/17 0942         Program Oxygen Prescription   Program Oxygen Prescription  Continuous;E-Tanks  Continuous;E-Tanks  Continuous;E-Tanks     Liters per minute  _0 Comments  2 liters is sufficient at this time while exercising  2 liters is sufficient at this time while exercising  2 liters is sufficient at this time while exercising       Home Oxygen   Home Oxygen Device  Home Concentrator;E-Tanks  Home Concentrator;E-Tanks  Home Concentrator;E-Tanks     Sleep Oxygen Prescription  Continuous  Continuous  Continuous     Liters per minute  _1 Home Exercise Oxygen Prescription  Continuous  Continuous  Continuous     Liters per minute  _2 Home at Rest Exercise Oxygen Prescription  Continuous  Continuous  Continuous      Liters per minute  _3 Compliance with Home Oxygen Use  Yes  Yes  Yes       Goals/Expected Outcomes   Short Term Goals  To learn and exhibit compliance with exercise, home and travel O2 prescription  To learn and exhibit compliance with exercise, home and travel O2 prescription  To learn and exhibit compliance  with exercise, home and travel O2 prescription;To learn and understand importance of monitoring SPO2 with pulse oximeter and demonstrate accurate use of the pulse oximeter.;To learn and understand importance of maintaining oxygen saturations>88%;To learn and demonstrate proper pursed lip breathing techniques or other breathing techniques.;To learn and demonstrate proper use of respiratory medications     Long  Term Goals  Exhibits compliance with exercise, home and travel O2 prescription  Exhibits compliance with exercise, home and travel O2 prescription  Exhibits compliance with exercise, home and travel O2 prescription;Verbalizes importance of monitoring SPO2 with pulse oximeter and return demonstration;Maintenance of O2 saturations>88%;Exhibits proper breathing techniques, such as pursed lip breathing or other method taught during program session;Compliance with respiratory medication;Demonstrates proper use of MDI's     Comments  Just started program, 2 liters is sufficient while exercising.  compliant with oxygen use  compliant with oxygen use     Goals/Expected Outcomes  Continue compliance with oxygen useage  continued compliance  continued compliance        Oxygen Discharge (Final Oxygen Re-Evaluation): Oxygen Re-Evaluation - 01/19/17 0942      Program Oxygen Prescription   Program Oxygen Prescription  Continuous;E-Tanks    Liters per minute  2    Comments  2 liters is sufficient at this time while exercising      Home Oxygen   Home Oxygen Device  Home Concentrator;E-Tanks    Sleep Oxygen Prescription  Continuous    Liters per minute  2    Home Exercise Oxygen  Prescription  Continuous    Liters per minute  2    Home at Rest Exercise Oxygen Prescription  Continuous    Liters per minute  2    Compliance with Home Oxygen Use  Yes      Goals/Expected Outcomes   Short Term Goals  To learn and exhibit compliance with exercise, home and travel O2 prescription;To learn and understand importance of monitoring SPO2 with pulse oximeter and demonstrate accurate use of the pulse oximeter.;To learn and understand importance of maintaining oxygen saturations>88%;To learn and demonstrate proper pursed lip breathing techniques or other breathing techniques.;To learn and demonstrate proper use of respiratory medications    Long  Term Goals  Exhibits compliance with exercise, home and travel O2 prescription;Verbalizes importance of monitoring SPO2 with pulse oximeter and return demonstration;Maintenance of O2 saturations>88%;Exhibits proper breathing techniques, such as pursed lip breathing or other method taught during program session;Compliance with respiratory medication;Demonstrates proper use of MDI's    Comments  compliant with oxygen use    Goals/Expected Outcomes  continued compliance       Initial Exercise Prescription: Initial Exercise Prescription - 11/30/16 0900      Date of Initial Exercise RX and Referring Provider   Date  11/28/16    Referring Provider  Dr. [name]      Oxygen   Oxygen  Continuous    Liters  2      Recumbant Bike   Level  1    Watts  15    Minutes  17      NuStep   Level  1    Minutes  17    METs  1.4      Track   Laps  10    Minutes  17      Prescription Details   Frequency (times per week)  2    Duration  Progress to 45 minutes of aerobic exercise without signs/symptoms of physical distress  Intensity   THRR 40-80% of Max Heartrate  61-122    Ratings of Perceived Exertion  11-13    Perceived Dyspnea  0-4      Progression   Progression  Continue progressive overload as per policy without signs/symptoms or  physical distress.      Resistance Training   Training Prescription  Yes    Weight  orange bands    Reps  10-15       Perform Capillary Blood Glucose checks as needed.  Exercise Prescription Changes: Exercise Prescription Changes    Row Name 12/05/16 1500 12/07/16 1300 12/12/16 1500 12/19/16 1500 12/26/16 1500     Response to Exercise   Blood Pressure (Admit)  114/64  132/70  110/50  124/62  100/60   Blood Pressure (Exercise)  132/64  210/100 recovered to 130/74  120/66  120/66  160/90   Blood Pressure (Exit)  120/70  122/70  104/60  108/60  132/66   Heart Rate (Admit)  86 bpm  83 bpm  77 bpm  88 bpm  80 bpm   Heart Rate (Exercise)  102 bpm  104 bpm  122 bpm  125 bpm  115 bpm   Heart Rate (Exit)  79 bpm  84 bpm  90 bpm  75 bpm  88 bpm   Oxygen Saturation (Admit)  95 %  95 %  96 %  95 %  95 %   Oxygen Saturation (Exercise)  96 %  96 %  94 %  94 %  95 %   Oxygen Saturation (Exit)  94 %  96 %  94 %  96 %  92 %   Rating of Perceived Exertion (Exercise)  _0 Perceived Dyspnea (Exercise)  _1 Duration  Progress to 45 minutes of aerobic exercise without signs/symptoms of physical distress  Progress to 45 minutes of aerobic exercise without signs/symptoms of physical distress  Progress to 45 minutes of aerobic exercise without signs/symptoms of physical distress  Progress to 45 minutes of aerobic exercise without signs/symptoms of physical distress  Progress to 45 minutes of aerobic exercise without signs/symptoms of physical distress   Intensity  Other (comment) 40-80% of HRR  Other (comment) 40-80% of HRR  THRR unchanged  THRR unchanged  THRR unchanged     Progression   Progression  Continue to progress workloads to maintain intensity without signs/symptoms of physical distress.  Continue to progress workloads to maintain intensity without signs/symptoms of physical distress.  Continue to progress workloads to maintain intensity without signs/symptoms of physical  distress.  Continue to progress workloads to maintain intensity without signs/symptoms of physical distress.  Continue to progress workloads to maintain intensity without signs/symptoms of physical distress.     Resistance Training   Training Prescription  Yes  Yes  Yes  Yes  Yes   Weight  orange bands  orange bands  orange bands  orange bands  orange bands   Reps  10-15  10-15  10-15  10-15  10-15   Time  10 Minutes  10 Minutes  10 Minutes  10 Minutes  10 Minutes     Oxygen   Oxygen  Continuous  Continuous  Continuous  Continuous  Continuous   Liters  _2 Recumbant Bike   Level  -  1  1  1  2   Watts  -  15  -  -  -   Minutes  -  _0 NuStep   Level  _1 Minutes  51  _2 METs  2  1.9  2.1  -  -     Track   Laps  -  -  _3 Minutes  -  -  _4 Home Exercise Plan   Plans to continue exercise at  -  -  -  -  Home (comment)   Frequency  -  -  -  -  Add 2 additional days to program exercise sessions.   Row Name 12/28/16 1300 01/04/17 1200 01/16/17 1500 01/18/17 1200       Response to Exercise   Blood Pressure (Admit)  140/70  116/74  134/70  132/76    Blood Pressure (Exercise)  136/64  124/70  188/86  132/70    Blood Pressure (Exit)  116/80  120/80  122/78  110/64    Heart Rate (Admit)  80 bpm  93 bpm  81 bpm  69 bpm    Heart Rate (Exercise)  105 bpm  127 bpm  106 bpm  134 bpm    Heart Rate (Exit)  91 bpm  95 bpm  90 bpm  84 bpm    Oxygen Saturation (Admit)  94 %  94 %  97 %  98 %    Oxygen Saturation (Exercise)  96 %  95 %  96 %  94 %    Oxygen Saturation (Exit)  95 %  94 %  95 %  95 %    Rating of Perceived Exertion (Exercise)  _5 Perceived Dyspnea (Exercise)  _6 Duration  Progress to 45 minutes of aerobic exercise without signs/symptoms of physical distress  Progress to 45 minutes of aerobic exercise without signs/symptoms of physical distress  Progress to 45 minutes of aerobic  exercise without signs/symptoms of physical distress  Progress to 45 minutes of aerobic exercise without signs/symptoms of physical distress    Intensity  THRR unchanged  THRR unchanged  THRR unchanged  THRR unchanged      Progression   Progression  Continue to progress workloads to maintain intensity without signs/symptoms of physical distress.  Continue to progress workloads to maintain intensity without signs/symptoms of physical distress.  Continue to progress workloads to maintain intensity without signs/symptoms of physical distress.  Continue to progress workloads to maintain intensity without signs/symptoms of physical distress.      Resistance Training   Training Prescription  Yes  Yes  Yes  Yes    Weight  orange bands  orange bands  orange bands  orange bands    Reps  10-15  10-15  10-15  10-15    Time  10 Minutes  10 Minutes  10 Minutes  10 Minutes      Oxygen   Oxygen  Continuous  Continuous  Continuous  Continuous    Liters  _7 Recumbant Bike   Level  -  -  2  2    Minutes  -  -  17  17      NuStep   Level  _0 -    Minutes  _1 -    METs  -  1.9  2.1  -      Track   Laps  _2 Minutes  _3 Exercise Comments: Exercise Comments    Row Name 12/28/16 0717           Exercise Comments  Home exercise completed          Exercise Goals and Review:   Exercise Goals Re-Evaluation : Exercise Goals Re-Evaluation    Row Name 12/12/16 0806 01/01/17 1614 01/18/17 1012         Exercise Goal Re-Evaluation   Exercise Goals Review  Increase Physical Activity;Increase Strength and Stamina;Able to understand and use Dyspnea scale;Able to understand and use rate of perceived exertion (RPE) scale;Knowledge and understanding of Target Heart Rate Range (THRR);Understanding of Exercise Prescription  Increase Physical Activity;Increase Strength and Stamina;Able to understand and use Dyspnea scale;Able to understand and use  rate of perceived exertion (RPE) scale;Knowledge and understanding of Target Heart Rate Range (THRR);Understanding of Exercise Prescription  Increase Strength and Stamina;Able to understand and use Dyspnea scale;Increase Physical Activity;Able to understand and use rate of perceived exertion (RPE) scale;Knowledge and understanding of Target Heart Rate Range (THRR);Understanding of Exercise Prescription     Comments  Patient has only attended 2 exercise sessions. Will cont. to monitor and progress as able.   Patient is making slow and steady gains. Patient states that she can see an improvement. Will cont. to monitor and progress as able.   Patient is making slow and steady gains. Patient states that she can see an improvement. Will cont. to monitor and progress as able.      Expected Outcomes  Through exercise at rehab and at home, patient will increase strength and stamina and find that ADL's are easier to preform.   Through exercise at rehab and at home, patient will increase strength and stamina and find that ADL's are easier to preform.    Through exercise at rehab and at home, patient will increase strength and stamina and be able to perform ADL's easier.         Discharge Exercise Prescription (Final Exercise Prescription Changes): Exercise Prescription Changes - 01/18/17 1200      Response to Exercise   Blood Pressure (Admit)  132/76    Blood Pressure (Exercise)  132/70    Blood Pressure (Exit)  110/64    Heart Rate (Admit)  69 bpm    Heart Rate (Exercise)  134 bpm    Heart Rate (Exit)  84 bpm    Oxygen Saturation (Admit)  98 %    Oxygen Saturation (Exercise)  94 %    Oxygen Saturation (Exit)  95 %    Rating of Perceived Exertion (Exercise)  13    Perceived Dyspnea (Exercise)  3    Duration  Progress to 45 minutes of aerobic exercise without signs/symptoms of physical distress    Intensity  THRR unchanged      Progression   Progression  Continue to progress workloads to maintain  intensity without signs/symptoms of physical distress.      Resistance Training   Training Prescription  Yes    Weight  orange bands    Reps  10-15    Time  10 Minutes      Oxygen   Oxygen  Continuous    Liters  2      Recumbant Bike   Level  2    Minutes  17      Track   Laps  11    Minutes  17       Nutrition:  Target Goals: Understanding of nutrition guidelines, daily intake of sodium <1535m, cholesterol <2035m calories 30% from fat and 7% or less from saturated fats, daily to have 5 or more servings of fruits and vegetables.  Biometrics: Pre Biometrics - 11/27/16 1034      Pre Biometrics   Height  _0  (1.575 m)    Weight  184 lb 15.5 oz (83.9 kg)    BMI (Calculated)  33.82    Grip Strength  31 kg        Nutrition Therapy Plan and Nutrition Goals: Nutrition Therapy & Goals - 12/29/16 1347      Nutrition Therapy   Diet  Heart Healthy      Personal Nutrition Goals   Nutrition Goal  Identify food quantities necessary to achieve wt loss of  -2# per week to a goal wt loss of 6-24 lb at graduation from pulmonary rehab.    Personal Goal #2  Describe the benefit of including fruits, vegetables, whole grains, and low-fat dairy products in a healthy meal plan.      Intervention Plan   Intervention  Prescribe, educate and counsel regarding individualized specific dietary modifications aiming towards targeted core components such as weight, hypertension, lipid management, diabetes, heart failure and other comorbidities.    Expected Outcomes  Short Term Goal: Understand basic principles of dietary content, such as calories, fat, sodium, cholesterol and nutrients.;Long Term Goal: Adherence to prescribed nutrition plan.       Nutrition Discharge: Rate Your Plate Scores: Nutrition Assessments - 12/29/16 1335      Rate Your Plate Scores   Pre Score  33       Nutrition Goals Re-Evaluation:   Nutrition Goals Discharge (Final Nutrition Goals  Re-Evaluation):   Psychosocial: Target Goals: Acknowledge presence or absence of significant depression and/or stress, maximize coping skills, provide positive support system. Participant is able to verbalize types and ability to use techniques and skills needed for reducing stress and depression.  Initial Review & Psychosocial Screening: Initial Psych Review & Screening - 11/27/16 1037      Initial Review   Current issues with  None Identified      Family Dynamics   Good Support System?  Yes      Barriers   Psychosocial barriers to participate in program  There are no identifiable barriers or psychosocial needs.      Screening Interventions   Interventions  Encouraged to exercise       Quality of Life Scores:   PHQ-9: Recent Review Flowsheet Data    [facility]   Decreased Interest 0   Down, Depressed, Hopeless 1   PHQ - 2 Score 1     Interpretation of Total Score  Total Score Depression Severity:  1-4 = Minimal depression, 5-9 = Mild depression, 10-14 = Moderate depression, 15-19 = Moderately severe depression, 20-27 = Severe depression   Psychosocial Evaluation and Intervention: Psychosocial Evaluation - 11/27/16 1037      Psychosocial Evaluation & Interventions   Interventions  Encouraged to exercise with the program and follow exercise prescription  Continue Psychosocial Services   No Follow up required       Psychosocial Re-Evaluation: Psychosocial Re-Evaluation    [place] Name 12/12/16 1002 01/04/17 0952 01/19/17 0943         Psychosocial Re-Evaluation   Current issues with  None Identified  None Identified  None Identified     Expected Outcomes  -  -  no barriers to participation in program     Interventions  Encouraged to attend Pulmonary Rehabilitation for the exercise  Encouraged to attend Pulmonary Rehabilitation for the exercise  Encouraged to attend Pulmonary Rehabilitation for the exercise     Continue Psychosocial  Services   No Follow up required  No Follow up required  No Follow up required        Psychosocial Discharge (Final Psychosocial Re-Evaluation): Psychosocial Re-Evaluation - 01/19/17 0943      Psychosocial Re-Evaluation   Current issues with  None Identified    Expected Outcomes  no barriers to participation in program    Interventions  Encouraged to attend Pulmonary Rehabilitation for the exercise    Continue Psychosocial Services   No Follow up required       Education: Education Goals: Education classes will be provided on a weekly basis, covering required topics. Participant will state understanding/return demonstration of topics presented.  Learning Barriers/Preferences: Learning Barriers/Preferences - 11/27/16 1028      Learning Barriers/Preferences   Learning Barriers  None    Learning Preferences  Group Instruction       Education Topics: Risk Factor Reduction:  -Group instruction that is supported by a PowerPoint presentation. Instructor discusses the definition of a risk factor, different risk factors for pulmonary disease, and how the heart and lungs work together.     Nutrition for Pulmonary Patient:  -Group instruction provided by PowerPoint slides, verbal discussion, and written materials to support subject matter. The instructor gives an explanation and review of healthy diet recommendations, which includes a discussion on weight management, recommendations for fruit and vegetable consumption, as well as protein, fluid, caffeine, fiber, sodium, sugar, and alcohol. Tips for eating when patients are short of breath are discussed.   Pursed Lip Breathing:  -Group instruction that is supported by demonstration and informational handouts. Instructor discusses the benefits of pursed lip and diaphragmatic breathing and detailed demonstration on how to preform both.     Oxygen Safety:  -Group instruction provided by PowerPoint, verbal discussion, and written material to  support subject matter. There is an overview of "What is Oxygen" and "Why do we need it".  Instructor also reviews how to create a safe environment for oxygen use, the importance of using oxygen as prescribed, and the risks of noncompliance. There is a brief discussion on traveling with oxygen and resources the patient may utilize.   Oxygen Equipment:  -Group instruction provided by [facility] Staff utilizing handouts, written materials, and equipment demonstrations.   PULMONARY REHAB CHRONIC OBSTRUCTIVE PULMONARY DISEASE from 01/18/2017 in [place]  Date  12/28/16  Educator  Ace Gins  Instruction Review Code  2- meets goals/outcomes      Signs and Symptoms:  -Group instruction provided by written material and verbal discussion to support subject matter. Warning signs and symptoms of infection, stroke, and heart attack are reviewed and when to call the physician/911 reinforced. Tips for preventing the spread of infection discussed.   Advanced Directives:  -Group instruction provided by verbal instruction and written material to support subject matter. Instructor reviews Scientist, physiological  laws and proper instruction for filling out document.   Pulmonary Video:  -Group video education that reviews the importance of medication and oxygen compliance, exercise, good nutrition, pulmonary hygiene, and pursed lip and diaphragmatic breathing for the pulmonary patient.   Exercise for the Pulmonary Patient:  -Group instruction that is supported by a PowerPoint presentation. Instructor discusses benefits of exercise, core components of exercise, frequency, duration, and intensity of an exercise routine, importance of utilizing pulse oximetry during exercise, safety while exercising, and options of places to exercise outside of rehab.     Pulmonary Medications:  -Verbally interactive group education provided by instructor with focus on inhaled medications and proper  administration.   Anatomy and Physiology of the Respiratory System and Intimacy:  -Group instruction provided by PowerPoint, verbal discussion, and written material to support subject matter. Instructor reviews respiratory cycle and anatomical components of the respiratory system and their functions. Instructor also reviews differences in obstructive and restrictive respiratory diseases with examples of each. Intimacy, Sex, and Sexuality differences are reviewed with a discussion on how relationships can change when diagnosed with pulmonary disease. Common sexual concerns are reviewed.   PULMONARY REHAB CHRONIC OBSTRUCTIVE PULMONARY DISEASE from 01/18/2017 in [place]  Date  01/18/17  Educator  RN  Instruction Review Code  2- meets goals/outcomes      MD DAY -A group question and answer session with a medical doctor that allows participants to ask questions that relate to their pulmonary disease state.   OTHER EDUCATION -Group or individual verbal, written, or video instructions that support the educational goals of the pulmonary rehab program.   PULMONARY REHAB CHRONIC OBSTRUCTIVE PULMONARY DISEASE from 01/18/2017 in [place]  Date  12/07/16  Educator  [employer]  Instruction Review Code  2- Demonstrated Understanding      Knowledge Questionnaire Score: Knowledge Questionnaire Score - 12/05/16 1610      Knowledge Questionnaire Score   Pre Score  11/13       Core Components/Risk Factors/Patient Goals at Admission: Personal Goals and Risk Factors at Admission - 11/27/16 1035      Core Components/Risk Factors/Patient Goals on Admission    Weight Management  Yes    Intervention  Weight Management: Develop a combined nutrition and exercise program designed to reach desired caloric intake, while maintaining appropriate intake of nutrient and fiber, sodium and fats, and appropriate energy expenditure required for the weight  goal.    Admit Weight  184 lb 15.5 oz (83.9 kg)    Goal Weight: Short Term  179 lb (81.2 kg)    Expected Outcomes  Short Term: Continue to assess and modify interventions until short term weight is achieved    Improve shortness of breath with ADL's  Yes    Intervention  Provide education, individualized exercise plan and daily activity instruction to help decrease symptoms of SOB with activities of daily living.    Expected Outcomes  Short Term: Achieves a reduction of symptoms when performing activities of daily living.    Develop more efficient breathing techniques such as purse lipped breathing and diaphragmatic breathing; and practicing self-pacing with activity  Yes    Intervention  Provide education, demonstration and support about specific breathing techniuqes utilized for more efficient breathing. Include techniques such as pursed lipped breathing, diaphragmatic breathing and self-pacing activity.    Expected Outcomes  Short Term: Participant will be able to demonstrate and use breathing techniques as needed throughout daily activities.  Increase knowledge of respiratory medications and ability to use respiratory devices properly   Yes    Intervention  Provide education and demonstration as needed of appropriate use of medications, inhalers, and oxygen therapy.    Expected Outcomes  Short Term: Achieves understanding of medications use. Understands that oxygen is a medication prescribed by physician. Demonstrates appropriate use of inhaler and oxygen therapy.       Core Components/Risk Factors/Patient Goals Review:  Goals and Risk Factor Review    Row Name 12/12/16 1000 01/04/17 0951 01/19/17 0943         Core Components/Risk Factors/Patient Goals Review   Personal Goals Review  Weight Management/Obesity;Improve shortness of breath with ADL's;Increase knowledge of respiratory medications and ability to use respiratory devices properly.;Develop more efficient breathing techniques such  as purse lipped breathing and diaphragmatic breathing and practicing self-pacing with activity.  Weight Management/Obesity;Improve shortness of breath with ADL's;Increase knowledge of respiratory medications and ability to use respiratory devices properly.;Develop more efficient breathing techniques such as purse lipped breathing and diaphragmatic breathing and practicing self-pacing with activity.  Weight Management/Obesity;Improve shortness of breath with ADL's;Increase knowledge of respiratory medications and ability to use respiratory devices properly.;Develop more efficient breathing techniques such as purse lipped breathing and diaphragmatic breathing and practicing self-pacing with activity.     Review  Just started program, has attended 2 exercise sessions, too early to see progression toward goals  no weight loss, but strength and stamina have increased and I see major confidance building  no weight loss, but strength and stamina have increased and I see major confidance building     Expected Outcomes  In the next 30 days, should see progression toward goals  Continued improvement towards admission goals  Continued improvement towards admission goals        Core Components/Risk Factors/Patient Goals at Discharge (Final Review):  Goals and Risk Factor Review - 01/19/17 0943      Core Components/Risk Factors/Patient Goals Review   Personal Goals Review  Weight Management/Obesity;Improve shortness of breath with ADL's;Increase knowledge of respiratory medications and ability to use respiratory devices properly.;Develop more efficient breathing techniques such as purse lipped breathing and diaphragmatic breathing and practicing self-pacing with activity.    Review  no weight loss, but strength and stamina have increased and I see major confidance building    Expected Outcomes  Continued improvement towards admission goals       ITP Comments:   Comments: ITP REVIEW Pt is making expected  progress toward pulmonary rehab goals after completing 9 sessions. Recommend continued exercise, life style modification, education, and utilization of breathing techniques to increase stamina and strength and decrease shortness of breath with exertion.

## 2017-01-25 NOTE — Progress Notes (Signed)
Daily Session Note  Patient Details  Name: Erin Morrison MRN: [id number] Date of Birth: 02-15-1950 Referring Provider:     Pulmonary Rehab Walk Test from 11/28/2016 in [place]  Referring Provider  Dr. [name]      Encounter Date: 01/25/2017  Check In: Session Check In - 01/25/17 1026      Check-In   Location  [facility]    Staff Present  [name], RN, BSN;[name], RN;Raiven Belizaire Rollene Rotunda, RN, BSN    Supervising physician immediately available to respond to emergencies  [facility] immediately available    Physician(s)  Dr. Rockne Menghini    Medication changes reported      No    Fall or balance concerns reported     No    Tobacco Cessation  No Change    Warm-up and Cool-down  Performed as group-led instruction    Resistance Training Performed  Yes    VAD Patient?  No      Pain Assessment   Currently in Pain?  No/denies    Multiple Pain Sites  No       Capillary Blood Glucose: No results found for this or any previous visit (from the past 24 hour(s)).  Exercise Prescription Changes - 01/25/17 1249      Response to Exercise   Blood Pressure (Admit)  122/70    Blood Pressure (Exercise)  148/62    Blood Pressure (Exit)  102/58    Heart Rate (Admit)  85 bpm    Heart Rate (Exercise)  124 bpm    Heart Rate (Exit)  91 bpm    Oxygen Saturation (Admit)  99 %    Oxygen Saturation (Exercise)  95 %    Oxygen Saturation (Exit)  96 %    Rating of Perceived Exertion (Exercise)  13    Perceived Dyspnea (Exercise)  3    Duration  Progress to 45 minutes of aerobic exercise without signs/symptoms of physical distress    Intensity  THRR unchanged      Progression   Progression  Continue to progress workloads to maintain intensity without signs/symptoms of physical distress.      Resistance Training   Training Prescription  Yes    Weight  orange bands    Reps  10-15    Time  10 Minutes      Oxygen   Oxygen  Continuous    Liters  2      Recumbant Bike   Level  1    Minutes  17      NuStep   Level  3    Minutes  17    METs  2      Track   Laps  6    Minutes  17       Social History   Tobacco Use  Smoking Status Former Smoker  . Packs/day: 1.50  . Years: 46.00  . Pack years: 69.00  . Types: Cigarettes  . Start date: 01/31/1964  . Last attempt to quit: 01/30/2010  . Years since quitting: 6.9  Smokeless Tobacco Never Used    Goals Met:  Improved SOB with ADL's Using PLB without cueing & demonstrates good technique Exercise tolerated well No report of cardiac concerns or symptoms Strength training completed today  Goals Unmet:  Not Applicable  Comments: Service time is from 1030 to 1215   Dr. [name] is Medical Director for Pulmonary Rehab at [facility].

## 2017-02-01 ENCOUNTER — Encounter (HOSPITAL_COMMUNITY): Payer: Managed Care, Other (non HMO)

## 2017-02-01 ENCOUNTER — Telehealth (HOSPITAL_COMMUNITY): Payer: Self-pay | Admitting: Physician Assistant

## 2017-02-06 ENCOUNTER — Encounter (HOSPITAL_COMMUNITY): Payer: Managed Care, Other (non HMO)

## 2017-02-08 ENCOUNTER — Encounter (HOSPITAL_COMMUNITY): Payer: Managed Care, Other (non HMO)

## 2017-02-08 ENCOUNTER — Telehealth (HOSPITAL_COMMUNITY): Payer: Self-pay | Admitting: Physician Assistant

## 2017-02-09 ENCOUNTER — Other Ambulatory Visit: Payer: Self-pay | Admitting: Interventional Cardiology

## 2017-02-12 ENCOUNTER — Other Ambulatory Visit: Payer: Self-pay

## 2017-02-12 MED ORDER — PREDNISONE 10 MG PO TABS: 10.0000 mg | ORAL_TABLET | Freq: Every day | ORAL | 1 refills | Status: DC

## 2017-02-13 ENCOUNTER — Encounter (HOSPITAL_COMMUNITY)
Admission: RE | Admit: 2017-02-13 | Discharge: 2017-02-13 | Disposition: A | Discharge status: Home / Self Care | Payer: Managed Care, Other (non HMO) | Source: Ambulatory Visit | Attending: Pulmonary Disease | Admitting: Pulmonary Disease

## 2017-02-13 VITALS — Wt 185.4 lb

## 2017-02-13 DIAGNOSIS — J449 Chronic obstructive pulmonary disease, unspecified: Secondary | ICD-10-CM | POA: Insufficient documentation

## 2017-02-13 DIAGNOSIS — I10 Essential (primary) hypertension: Secondary | ICD-10-CM | POA: Diagnosis not present

## 2017-02-13 DIAGNOSIS — Z86718 Personal history of other venous thrombosis and embolism: Secondary | ICD-10-CM | POA: Diagnosis not present

## 2017-02-13 DIAGNOSIS — M545 Low back pain: Secondary | ICD-10-CM | POA: Insufficient documentation

## 2017-02-13 DIAGNOSIS — Z87891 Personal history of nicotine dependence: Secondary | ICD-10-CM | POA: Diagnosis not present

## 2017-02-13 DIAGNOSIS — Z79899 Other long term (current) drug therapy: Secondary | ICD-10-CM | POA: Insufficient documentation

## 2017-02-13 NOTE — Progress Notes (Signed)
Daily Session Note  Patient Details  Name: Erin Morrison MRN: [id number] Date of Birth: [birth year] Referring Provider:     Pulmonary Rehab Walk Test from 11/28/2016 in [place]  Referring Provider  Dr. [name]      Encounter Date: 02/13/2017  Check In: Session Check In - 02/13/17 1330      Check-In   Location  [facility]    Staff Present  [name], RN, Luisa Hart, RN, BSN;Molly diVincenzo, MS, ACSM RCEP, Exercise Physiologist;[name], RN    Supervising physician immediately available to respond to emergencies  [facility] immediately available    Physician(s)  Dr. Eliseo Squires    Medication changes reported      No    Fall or balance concerns reported     No    Tobacco Cessation  No Change    Warm-up and Cool-down  Performed as group-led instruction    Resistance Training Performed  Yes    VAD Patient?  No      Pain Assessment   Currently in Pain?  No/denies    Multiple Pain Sites  No       Capillary Blood Glucose: No results found for this or any previous visit (from the past 24 hour(s)).  Exercise Prescription Changes - 02/13/17 1511      Response to Exercise   Blood Pressure (Admit)  134/64    Blood Pressure (Exercise)  170/70    Blood Pressure (Exit)  120/58    Heart Rate (Admit)  70 bpm    Heart Rate (Exercise)  119 bpm    Heart Rate (Exit)  93 bpm    Oxygen Saturation (Admit)  97 %    Oxygen Saturation (Exercise)  95 %    Oxygen Saturation (Exit)  96 %    Rating of Perceived Exertion (Exercise)  13    Perceived Dyspnea (Exercise)  2    Duration  Progress to 45 minutes of aerobic exercise without signs/symptoms of physical distress    Intensity  THRR unchanged      Progression   Progression  Continue to progress workloads to maintain intensity without signs/symptoms of physical distress.      Resistance Training   Training Prescription  Yes    Weight  orange bands    Reps  10-15    Time   10 Minutes      Oxygen   Oxygen  Continuous    Liters  2      Recumbant Bike   Level  2    Minutes  17      NuStep   Level  3    Minutes  17    METs  2.1      Track   Laps  9    Minutes  17       Social History   Tobacco Use  Smoking Status Former Smoker  . Packs/day: 1.50  . Years: 46.00  . Pack years: 69.00  . Types: Cigarettes  . Start date: 01/31/1964  . Last attempt to quit: 01/30/2010  . Years since quitting: 7.0  Smokeless Tobacco Never Used    Goals Met:  Using PLB without cueing & demonstrates good technique Exercise tolerated well No report of cardiac concerns or symptoms Strength training completed today  Goals Unmet:  Not Applicable  Comments: Service time is from 1330 to 1450   Dr. [name] is Medical Director for Pulmonary Rehab at [facility]  [facility].

## 2017-02-15 ENCOUNTER — Encounter (HOSPITAL_COMMUNITY)
Admission: RE | Admit: 2017-02-15 | Discharge: 2017-02-15 | Disposition: A | Discharge status: Home / Self Care | Payer: Managed Care, Other (non HMO) | Source: Ambulatory Visit | Attending: Pulmonary Disease | Admitting: Pulmonary Disease

## 2017-02-15 VITALS — Wt 185.4 lb

## 2017-02-15 DIAGNOSIS — J449 Chronic obstructive pulmonary disease, unspecified: Secondary | ICD-10-CM

## 2017-02-15 NOTE — Progress Notes (Signed)
Daily Session Note  Patient Details  Name: Erin Morrison MRN: [id number] Date of Birth: February 12, 1950 Referring Provider:     Pulmonary Rehab Walk Test from 11/28/2016 in [place]  Referring Provider  Dr. [name]      Encounter Date: 02/15/2017  Check In: Session Check In - 02/15/17 1026      Check-In   Location  [facility]    Staff Present  Rodney Langton, RN;[name], RN, BSN;Lincy Belles, MS, ACSM RCEP, Exercise Physiologist;[name] Leonia Reeves, RN, BSN    Supervising physician immediately available to respond to emergencies  [facility] immediately available    Physician(s)  Dr. Cathlean Sauer    Medication changes reported      No    Fall or balance concerns reported     No    Tobacco Cessation  No Change    Warm-up and Cool-down  Performed as group-led instruction    Resistance Training Performed  Yes    VAD Patient?  No      Pain Assessment   Currently in Pain?  No/denies    Multiple Pain Sites  No       Capillary Blood Glucose: No results found for this or any previous visit (from the past 24 hour(s)).  Exercise Prescription Changes - 02/15/17 1200      Response to Exercise   Blood Pressure (Admit)  112/60    Blood Pressure (Exercise)  120/70    Blood Pressure (Exit)  96/70    Heart Rate (Admit)  74 bpm    Heart Rate (Exercise)  95 bpm    Heart Rate (Exit)  82 bpm    Oxygen Saturation (Admit)  93 %    Oxygen Saturation (Exercise)  96 %    Oxygen Saturation (Exit)  96 %    Rating of Perceived Exertion (Exercise)  13    Perceived Dyspnea (Exercise)  2    Duration  Progress to 45 minutes of aerobic exercise without signs/symptoms of physical distress    Intensity  THRR unchanged      Progression   Progression  Continue to progress workloads to maintain intensity without signs/symptoms of physical distress.      Resistance Training   Training Prescription  Yes    Weight  orange bands    Reps  10-15    Time   10 Minutes      Oxygen   Oxygen  Continuous    Liters  2      NuStep   Level  3    Minutes  17    METs  2.2      Track   Laps  11    Minutes  17       Social History   Tobacco Use  Smoking Status Former Smoker  . Packs/day: 1.50  . Years: 46.00  . Pack years: 69.00  . Types: Cigarettes  . Start date: 01/31/1964  . Last attempt to quit: 01/30/2010  . Years since quitting: 7.0  Smokeless Tobacco Never Used    Goals Met:  Exercise tolerated well Queuing for purse lip breathing No report of cardiac concerns or symptoms  Goals Unmet:  Not Applicable  Comments: Service time is from 10:30a to 12:15p    Dr. [name] is Medical Director for Pulmonary Rehab at [facility].

## 2017-02-16 ENCOUNTER — Other Ambulatory Visit: Payer: Self-pay

## 2017-02-16 DIAGNOSIS — J449 Chronic obstructive pulmonary disease, unspecified: Secondary | ICD-10-CM

## 2017-02-16 MED ORDER — BUDESONIDE 0.5 MG/2ML IN SUSP: RESPIRATORY_TRACT | 3 refills | Status: DC

## 2017-02-16 MED ORDER — FORMOTEROL FUMARATE 20 MCG/2ML IN NEBU: INHALATION_SOLUTION | RESPIRATORY_TRACT | 3 refills | Status: DC

## 2017-02-20 ENCOUNTER — Encounter (HOSPITAL_COMMUNITY)
Admission: RE | Admit: 2017-02-20 | Discharge: 2017-02-20 | Disposition: A | Discharge status: Home / Self Care | Payer: Managed Care, Other (non HMO) | Source: Ambulatory Visit | Attending: Physician Assistant | Admitting: Physician Assistant

## 2017-02-20 VITALS — Wt 185.8 lb

## 2017-02-20 DIAGNOSIS — J449 Chronic obstructive pulmonary disease, unspecified: Secondary | ICD-10-CM | POA: Diagnosis not present

## 2017-02-20 NOTE — Progress Notes (Signed)
Pulmonary Individual Treatment Plan  Patient Details  Name: Erin Morrison MRN: [id number] Date of Birth: 07/26/1950 Referring Provider:     Pulmonary Rehab Walk Test from 11/28/2016 in [facility]  Referring Provider  Dr. [name]      Initial Encounter Date:    Pulmonary Rehab Walk Test from 11/28/2016 in [facility]  Date  11/28/16  Referring Provider  Dr. [name]      Visit Diagnosis: Chronic obstructive pulmonary disease, unspecified COPD type ([place])  Patient's Home Medications on Admission:   Current Outpatient Medications:  .  acetaminophen (TYLENOL) 500 MG tablet, Take 500 mg by mouth 2 (two) times daily as needed for moderate pain or headache., Disp: , Rfl:  .  albuterol (PROVENTIL HFA;VENTOLIN HFA) 108 (90 Base) MCG/ACT inhaler, Inhale 2 puffs into the lungs every 6 (six) hours as needed for wheezing or shortness of breath. , Disp: , Rfl:  .  albuterol (PROVENTIL) (2.5 MG/3ML) 0.083% nebulizer solution, Take 2.5 mg by nebulization every 6 (six) hours as needed for wheezing or shortness of breath., Disp: , Rfl:  .  aspirin 81 MG tablet, Take 81 mg by mouth daily., Disp: , Rfl:  .  atorvastatin (LIPITOR) 40 MG tablet, Take 1 tablet (40 mg total) by mouth daily at 6 PM., Disp: 90 tablet, Rfl: 2 .  budesonide (PULMICORT) 0.5 MG/2ML nebulizer solution, USE 1 VIAL VIA NEBULIZER 2 TIMES DAILY -DX CODE J44.9, Disp: 120 mL, Rfl: 3 .  cetirizine (ZYRTEC) 10 MG tablet, Take 10 mg by mouth daily., Disp: , Rfl:  .  DALIRESP 500 MCG TABS tablet, TAKE 1 TABLET BY MOUTH DAILY., Disp: 30 tablet, Rfl: 3 .  esomeprazole (NEXIUM) 40 MG capsule, Take 40 mg by mouth daily as needed (for acid reflex). , Disp: , Rfl:  .  formoterol (PERFOROMIST) 20 MCG/2ML nebulizer solution, USE 1 VIAL VIA NEBULIZER TWICE A DAY, Disp: 120 mL, Rfl: 3 .  Glycopyrrolate (LONHALA MAGNAIR STARTER KIT) 25 MCG/ML SOLN, Inhale 25 mcg into the lungs 2 (two) times  daily., Disp: 1 mL, Rfl: 6 .  isosorbide mononitrate (IMDUR) 30 MG 24 hr tablet, Take 0.5 tablets (15 mg total) by mouth daily., Disp: 45 tablet, Rfl: 2 .  montelukast (SINGULAIR) 10 MG tablet, TAKE 1 TABLET BY MOUTH AT BEDTIME, Disp: 30 tablet, Rfl: 3 .  nitroGLYCERIN (NITROSTAT) 0.4 MG SL tablet, Place 1 tablet (0.4 mg total) under the tongue every 5 (five) minutes as needed for chest pain., Disp: 90 tablet, Rfl: 3 .  Polyethyl Glycol-Propyl Glycol (SYSTANE OP), Apply 1-2 drops to eye daily as needed (dry eyes)., Disp: , Rfl:  .  predniSONE (DELTASONE) 10 MG tablet, Take 1 tablet (10 mg total) by mouth daily with breakfast., Disp: 30 tablet, Rfl: 1 .  Spacer/Aero-Holding Chambers (AEROCHAMBER MV) inhaler, Use as instructed, Disp: 1 each, Rfl: 0  Past Medical History: Past Medical History:  Diagnosis Date  . Acute DVT (deep venous thrombosis) ([place])   . Allergic rhinitis   . Atrophic vaginitis   . COPD (chronic obstructive pulmonary disease) ([place])   . Hypertension   . Low back pain   . Umbilical hernia     Tobacco Use: Social History   Tobacco Use  Smoking Status Former Smoker  . Packs/day: 1.50  . Years: 46.00  . Pack years: 69.00  . Types: Cigarettes  . Start date: 01/31/1964  . Last attempt to quit: 01/30/2010  . Years since quitting:  7.0  Smokeless Tobacco Never Used    Labs: Recent Review Flowsheet Data    Labs for ITP Cardiac and Pulmonary Rehab Latest Ref Rng & Units 12/04/2015 03/03/2016 03/03/2016 04/05/2016 11/27/2016   Cholestrol 100 - 199 mg/dL 207(H) - - 138 124   LDLCALC 0 - 99 mg/dL 134(H) - - 55 52   HDL >39 mg/dL 57 - - 60 55   Trlycerides 0 - 149 mg/dL 79 - - 115 83   Hemoglobin A1c 4.8 - 5.6 % 5.5 - - - -   PHART 7.350 - 7.450 - - 7.419 - -   PCO2ART 32.0 - 48.0 mmHg - - 34.0 - -   HCO3 20.0 - 28.0 mmol/L - 22.3 22.0 - -   TCO2 0 - 100 mmol/L - 23 23 - -   ACIDBASEDEF 0.0 - 2.0 mmol/L - 2.0 2.0 - -   O2SAT % - 65.0 94.0 - -      Capillary Blood Glucose: No  results found for: GLUCAP   Pulmonary Assessment Scores: Pulmonary Assessment Scores    Row Name 11/30/16 0908 12/05/16 1610       ADL UCSD   ADL Phase  Entry  Entry    SOB Score total  -  60      CAT Score   CAT Score  -  29 Entry      mMRC Score   mMRC Score  3  -       Pulmonary Function Assessment: Pulmonary Function Assessment - 11/27/16 1029      Post Bronchodilator Spirometry Results   FEV1%  69 %    FEV1/FVC Ratio  66      Breath   Bilateral Breath Sounds  Clear    Shortness of Breath  Panic with Shortness of Breath;Limiting activity;Yes       Exercise Target Goals:    Exercise Program Goal: Individual exercise prescription set using results from initial 6 min walk test and THRR while considering  patient's activity barriers and safety.    Exercise Prescription Goal: Initial exercise prescription builds to 30-45 minutes a day of aerobic activity, 2-3 days per week.  Home exercise guidelines will be given to patient during program as part of exercise prescription that the participant will acknowledge.  Activity Barriers & Risk Stratification: Activity Barriers & Cardiac Risk Stratification - 11/27/16 1033      Activity Barriers & Cardiac Risk Stratification   Activity Barriers  -- Occassionally has bilateral hip pain       6 Minute Walk: 6 Minute Walk    Row Name 11/30/16 0859         6 Minute Walk   Phase  Initial     Distance  1300 feet     Walk Time  6 minutes     # of Rest Breaks  0     MPH  2.46     METS  2.91     RPE  13     Perceived Dyspnea   1     Symptoms  Yes (comment)     Comments  wheelchair     Resting HR  92 bpm     Resting BP  134/82     Resting Oxygen Saturation   93 %     Exercise Oxygen Saturation  during 6 min walk  85 %     Max Ex. HR  113 bpm     Max Ex. BP  150/72  Interval HR   1 Minute HR  94     2 Minute HR  93     3 Minute HR  100     4 Minute HR  112     5 Minute HR  113     6 Minute HR  112      2 Minute Post HR  96     Interval Heart Rate?  Yes       Interval Oxygen   Interval Oxygen?  Yes     Baseline Oxygen Saturation %  93 %     1 Minute Oxygen Saturation %  92 %     1 Minute Liters of Oxygen  0 L     2 Minute Oxygen Saturation %  85 %     2 Minute Liters of Oxygen  0 L     3 Minute Oxygen Saturation %  93 %     3 Minute Liters of Oxygen  2 L     4 Minute Oxygen Saturation %  96 %     4 Minute Liters of Oxygen  2 L     5 Minute Oxygen Saturation %  93 %     5 Minute Liters of Oxygen  2 L     6 Minute Oxygen Saturation %  91 %     6 Minute Liters of Oxygen  2 L     2 Minute Post Oxygen Saturation %  96 %     2 Minute Post Liters of Oxygen  2 L        Oxygen Initial Assessment: Oxygen Initial Assessment - 11/30/16 0907      Initial 6 min Walk   Oxygen Used  Continuous;E-Tanks    Liters per minute  2      Program Oxygen Prescription   Program Oxygen Prescription  Continuous;E-Tanks    Liters per minute  2    Comments  started off on room air-85%-titrated to 2 liters       Oxygen Re-Evaluation: Oxygen Re-Evaluation    Row Name 12/12/16 0957 01/04/17 0950 01/19/17 0942 02/19/17 1316       Program Oxygen Prescription   Program Oxygen Prescription  Continuous;E-Tanks  Continuous;E-Tanks  Continuous;E-Tanks  Continuous;E-Tanks    Liters per minute  _0 Comments  2 liters is sufficient at this time while exercising  2 liters is sufficient at this time while exercising  2 liters is sufficient at this time while exercising  2 liters is sufficient at this time while exercising      Home Oxygen   Home Oxygen Device  Home Concentrator;E-Tanks  Home Concentrator;E-Tanks  Home Concentrator;E-Tanks  -    Sleep Oxygen Prescription  Continuous  Continuous  Continuous  Continuous    Liters per minute  _1 Home Exercise Oxygen Prescription  Continuous  Continuous  Continuous  Continuous    Liters per minute  _2 Home at Rest Exercise Oxygen  Prescription  Continuous  Continuous  Continuous  Continuous    Liters per minute  _3 Compliance with Home Oxygen Use  Yes  Yes  Yes  Yes      Goals/Expected Outcomes   Short Term Goals  To learn and exhibit compliance with exercise, home and travel O2 prescription  To learn and exhibit compliance with exercise, home and travel O2 prescription  To learn and exhibit compliance with exercise, home and travel O2 prescription;To learn and understand importance of monitoring SPO2 with pulse oximeter and demonstrate accurate use of the pulse oximeter.;To learn and understand importance of maintaining oxygen saturations>88%;To learn and demonstrate proper pursed lip breathing techniques or other breathing techniques.;To learn and demonstrate proper use of respiratory medications  To learn and exhibit compliance with exercise, home and travel O2 prescription;To learn and understand importance of monitoring SPO2 with pulse oximeter and demonstrate accurate use of the pulse oximeter.;To learn and understand importance of maintaining oxygen saturations>88%;To learn and demonstrate proper pursed lip breathing techniques or other breathing techniques.;To learn and demonstrate proper use of respiratory medications    Long  Term Goals  Exhibits compliance with exercise, home and travel O2 prescription  Exhibits compliance with exercise, home and travel O2 prescription  Exhibits compliance with exercise, home and travel O2 prescription;Verbalizes importance of monitoring SPO2 with pulse oximeter and return demonstration;Maintenance of O2 saturations>88%;Exhibits proper breathing techniques, such as pursed lip breathing or other method taught during program session;Compliance with respiratory medication;Demonstrates proper use of MDI's  Exhibits compliance with exercise, home and travel O2 prescription;Verbalizes importance of monitoring SPO2 with pulse oximeter and return demonstration;Maintenance of O2  saturations>88%;Exhibits proper breathing techniques, such as pursed lip breathing or other method taught during program session;Compliance with respiratory medication;Demonstrates proper use of MDI's    Comments  Just started program, 2 liters is sufficient while exercising.  compliant with oxygen use  compliant with oxygen use  compliant with oxygen use    Goals/Expected Outcomes  Continue compliance with oxygen useage  continued compliance  continued compliance  continued compliance       Oxygen Discharge (Final Oxygen Re-Evaluation): Oxygen Re-Evaluation - 02/19/17 1316      Program Oxygen Prescription   Program Oxygen Prescription  Continuous;E-Tanks    Liters per minute  2    Comments  2 liters is sufficient at this time while exercising      Home Oxygen   Sleep Oxygen Prescription  Continuous    Liters per minute  2    Home Exercise Oxygen Prescription  Continuous    Liters per minute  2    Home at Rest Exercise Oxygen Prescription  Continuous    Liters per minute  2    Compliance with Home Oxygen Use  Yes      Goals/Expected Outcomes   Short Term Goals  To learn and exhibit compliance with exercise, home and travel O2 prescription;To learn and understand importance of monitoring SPO2 with pulse oximeter and demonstrate accurate use of the pulse oximeter.;To learn and understand importance of maintaining oxygen saturations>88%;To learn and demonstrate proper pursed lip breathing techniques or other breathing techniques.;To learn and demonstrate proper use of respiratory medications    Long  Term Goals  Exhibits compliance with exercise, home and travel O2 prescription;Verbalizes importance of monitoring SPO2 with pulse oximeter and return demonstration;Maintenance of O2 saturations>88%;Exhibits proper breathing techniques, such as pursed lip breathing or other method taught during program session;Compliance with respiratory medication;Demonstrates proper use of MDI's    Comments   compliant with oxygen use    Goals/Expected Outcomes  continued compliance       Initial Exercise Prescription: Initial Exercise Prescription - 11/30/16 0900      Date of Initial Exercise RX and Referring Provider   Date  11/28/16    Referring Provider  Dr. [name]  Oxygen   Oxygen  Continuous    Liters  2      Recumbant Bike   Level  1    Watts  15    Minutes  17      NuStep   Level  1    Minutes  17    METs  1.4      Track   Laps  10    Minutes  17      Prescription Details   Frequency (times per week)  2    Duration  Progress to 45 minutes of aerobic exercise without signs/symptoms of physical distress      Intensity   THRR 40-80% of Max Heartrate  61-122    Ratings of Perceived Exertion  11-13    Perceived Dyspnea  0-4      Progression   Progression  Continue progressive overload as per policy without signs/symptoms or physical distress.      Resistance Training   Training Prescription  Yes    Weight  orange bands    Reps  10-15       Perform Capillary Blood Glucose checks as needed.  Exercise Prescription Changes: Exercise Prescription Changes    Row Name 12/05/16 1500 12/07/16 1300 12/12/16 1500 12/19/16 1500 12/26/16 1500     Response to Exercise   Blood Pressure (Admit)  114/64  132/70  110/50  124/62  100/60   Blood Pressure (Exercise)  132/64  210/100 recovered to 130/74  120/66  120/66  160/90   Blood Pressure (Exit)  120/70  122/70  104/60  108/60  132/66   Heart Rate (Admit)  86 bpm  83 bpm  77 bpm  88 bpm  80 bpm   Heart Rate (Exercise)  102 bpm  104 bpm  122 bpm  125 bpm  115 bpm   Heart Rate (Exit)  79 bpm  84 bpm  90 bpm  75 bpm  88 bpm   Oxygen Saturation (Admit)  95 %  95 %  96 %  95 %  95 %   Oxygen Saturation (Exercise)  96 %  96 %  94 %  94 %  95 %   Oxygen Saturation (Exit)  94 %  96 %  94 %  96 %  92 %   Rating of Perceived Exertion (Exercise)  _0 Perceived Dyspnea (Exercise)  _1 Duration   Progress to 45 minutes of aerobic exercise without signs/symptoms of physical distress  Progress to 45 minutes of aerobic exercise without signs/symptoms of physical distress  Progress to 45 minutes of aerobic exercise without signs/symptoms of physical distress  Progress to 45 minutes of aerobic exercise without signs/symptoms of physical distress  Progress to 45 minutes of aerobic exercise without signs/symptoms of physical distress   Intensity  Other (comment) 40-80% of HRR  Other (comment) 40-80% of HRR  THRR unchanged  THRR unchanged  THRR unchanged     Progression   Progression  Continue to progress workloads to maintain intensity without signs/symptoms of physical distress.  Continue to progress workloads to maintain intensity without signs/symptoms of physical distress.  Continue to progress workloads to maintain intensity without signs/symptoms of physical distress.  Continue to progress workloads to maintain intensity without signs/symptoms of physical distress.  Continue to progress workloads to maintain intensity without signs/symptoms of physical distress.     Resistance Training  Training Prescription  Yes  Yes  Yes  Yes  Yes   Weight  orange bands  orange bands  orange bands  orange bands  orange bands   Reps  10-15  10-15  10-15  10-15  10-15   Time  10 Minutes  10 Minutes  10 Minutes  10 Minutes  10 Minutes     Oxygen   Oxygen  Continuous  Continuous  Continuous  Continuous  Continuous   Liters  _0 Recumbant Bike   Level  -  _1 Watts  -  15  -  -  -   Minutes  -  _2 NuStep   Level  _3 Minutes  51  _4 METs  2  1.9  2.1  -  -     Track   Laps  -  -  _5 Minutes  -  -  _6 Home Exercise Plan   Plans to continue exercise at  -  -  -  -  Home (comment)   Frequency  -  -  -  -  Add 2 additional days to program exercise sessions.   Row Name 12/28/16 1300 01/04/17 1200 01/16/17 1500  01/18/17 1200 01/25/17 1249     Response to Exercise   Blood Pressure (Admit)  140/70  116/74  134/70  132/76  122/70   Blood Pressure (Exercise)  136/64  124/70  188/86  132/70  148/62   Blood Pressure (Exit)  116/80  120/80  122/78  110/64  102/58   Heart Rate (Admit)  80 bpm  93 bpm  81 bpm  69 bpm  85 bpm   Heart Rate (Exercise)  105 bpm  127 bpm  106 bpm  134 bpm  124 bpm   Heart Rate (Exit)  91 bpm  95 bpm  90 bpm  84 bpm  91 bpm   Oxygen Saturation (Admit)  94 %  94 %  97 %  98 %  99 %   Oxygen Saturation (Exercise)  96 %  95 %  96 %  94 %  95 %   Oxygen Saturation (Exit)  95 %  94 %  95 %  95 %  96 %   Rating of Perceived Exertion (Exercise)  _7 Perceived Dyspnea (Exercise)  _8 Duration  Progress to 45 minutes of aerobic exercise without signs/symptoms of physical distress  Progress to 45 minutes of aerobic exercise without signs/symptoms of physical distress  Progress to 45 minutes of aerobic exercise without signs/symptoms of physical distress  Progress to 45 minutes of aerobic exercise without signs/symptoms of physical distress  Progress to 45 minutes of aerobic exercise without signs/symptoms of physical distress   Intensity  THRR unchanged  THRR unchanged  THRR unchanged  THRR unchanged  THRR unchanged     Progression   Progression  Continue to progress workloads to maintain intensity without signs/symptoms of physical distress.  Continue to progress workloads to maintain intensity without signs/symptoms of physical distress.  Continue to progress  workloads to maintain intensity without signs/symptoms of physical distress.  Continue to progress workloads to maintain intensity without signs/symptoms of physical distress.  Continue to progress workloads to maintain intensity without signs/symptoms of physical distress.     Resistance Training   Training Prescription  Yes  Yes  Yes  Yes  Yes   Weight  orange bands  orange bands  orange bands  orange  bands  orange bands   Reps  10-15  10-15  10-15  10-15  10-15   Time  10 Minutes  10 Minutes  10 Minutes  10 Minutes  10 Minutes     Oxygen   Oxygen  Continuous  Continuous  Continuous  Continuous  Continuous   Liters  _0 Recumbant Bike   Level  -  -  _1 Minutes  -  -  _2 NuStep   Level  _3 -  3   Minutes  _4 -  17   METs  -  1.9  2.1  -  2     Track   Laps  _5 Minutes  _6 Row Name 02/13/17 1511 02/15/17 1200           Response to Exercise   Blood Pressure (Admit)  134/64  112/60      Blood Pressure (Exercise)  170/70  120/70      Blood Pressure (Exit)  120/58  96/70      Heart Rate (Admit)  70 bpm  74 bpm      Heart Rate (Exercise)  119 bpm  95 bpm      Heart Rate (Exit)  93 bpm  82 bpm      Oxygen Saturation (Admit)  97 %  93 %      Oxygen Saturation (Exercise)  95 %  96 %      Oxygen Saturation (Exit)  96 %  96 %      Rating of Perceived Exertion (Exercise)  13  13      Perceived Dyspnea (Exercise)  2  2      Duration  Progress to 45 minutes of aerobic exercise without signs/symptoms of physical distress  Progress to 45 minutes of aerobic exercise without signs/symptoms of physical distress      Intensity  THRR unchanged  THRR unchanged        Progression   Progression  Continue to progress workloads to maintain intensity without signs/symptoms of physical distress.  Continue to progress workloads to maintain intensity without signs/symptoms of physical distress.        Resistance Training   Training Prescription  Yes  Yes      Weight  orange bands  orange bands      Reps  10-15  10-15      Time  10 Minutes  10 Minutes        Oxygen   Oxygen  Continuous  Continuous      Liters  2  2        Recumbant Bike   Level  2  -      Minutes  17  -        NuStep  Level  3  3      Minutes  17  17      METs  2.1  2.2        Track   Laps  9  11      Minutes  17  17          Exercise Comments: Exercise Comments    Row Name 12/28/16 0717           Exercise Comments  Home exercise completed          Exercise Goals and Review:   Exercise Goals Re-Evaluation : Exercise Goals Re-Evaluation    Row Name 12/12/16 9563 01/01/17 1614 01/18/17 1012 02/19/17 0725       Exercise Goal Re-Evaluation   Exercise Goals Review  Increase Physical Activity;Increase Strength and Stamina;Able to understand and use Dyspnea scale;Able to understand and use rate of perceived exertion (RPE) scale;Knowledge and understanding of Target Heart Rate Range (THRR);Understanding of Exercise Prescription  Increase Physical Activity;Increase Strength and Stamina;Able to understand and use Dyspnea scale;Able to understand and use rate of perceived exertion (RPE) scale;Knowledge and understanding of Target Heart Rate Range (THRR);Understanding of Exercise Prescription  Increase Strength and Stamina;Able to understand and use Dyspnea scale;Increase Physical Activity;Able to understand and use rate of perceived exertion (RPE) scale;Knowledge and understanding of Target Heart Rate Range (THRR);Understanding of Exercise Prescription  Increase Physical Activity;Able to understand and use rate of perceived exertion (RPE) scale;Knowledge and understanding of Target Heart Rate Range (THRR);Understanding of Exercise Prescription;Increase Strength and Stamina;Able to understand and use Dyspnea scale    Comments  Patient has only attended 2 exercise sessions. Will cont. to monitor and progress as able.   Patient is making slow and steady gains. Patient states that she can see an improvement. Will cont. to monitor and progress as able.   Patient is making slow and steady gains. Patient states that she can see an improvement. Will cont. to monitor and progress as able.   Patient is making slow and steady gains. Is able to walk up to 11 laps (200 ft each) in 15 minutes. Patient states that she can see an  improvement. Will encourage home exercise. Will cont. to monitor and progress as able.     Expected Outcomes  Through exercise at rehab and at home, patient will increase strength and stamina and find that ADL's are easier to preform.   Through exercise at rehab and at home, patient will increase strength and stamina and find that ADL's are easier to preform.    Through exercise at rehab and at home, patient will increase strength and stamina and be able to perform ADL's easier.   Through exercise at rehab and at home, patient will increase strength and stamina and be less short of breath with ADL's at home.        Discharge Exercise Prescription (Final Exercise Prescription Changes): Exercise Prescription Changes - 02/15/17 1200      Response to Exercise   Blood Pressure (Admit)  112/60    Blood Pressure (Exercise)  120/70    Blood Pressure (Exit)  96/70    Heart Rate (Admit)  74 bpm    Heart Rate (Exercise)  95 bpm    Heart Rate (Exit)  82 bpm    Oxygen Saturation (Admit)  93 %    Oxygen Saturation (Exercise)  96 %    Oxygen Saturation (Exit)  96 %    Rating of Perceived Exertion (Exercise)  13  Perceived Dyspnea (Exercise)  2    Duration  Progress to 45 minutes of aerobic exercise without signs/symptoms of physical distress    Intensity  THRR unchanged      Progression   Progression  Continue to progress workloads to maintain intensity without signs/symptoms of physical distress.      Resistance Training   Training Prescription  Yes    Weight  orange bands    Reps  10-15    Time  10 Minutes      Oxygen   Oxygen  Continuous    Liters  2      NuStep   Level  3    Minutes  17    METs  2.2      Track   Laps  11    Minutes  17       Nutrition:  Target Goals: Understanding of nutrition guidelines, daily intake of sodium <1532m, cholesterol <206m calories 30% from fat and 7% or less from saturated fats, daily to have 5 or more servings of fruits and  vegetables.  Biometrics: Pre Biometrics - 11/27/16 1034      Pre Biometrics   Height  _0  (1.575 m)    Weight  184 lb 15.5 oz (83.9 kg)    BMI (Calculated)  33.82    Grip Strength  31 kg        Nutrition Therapy Plan and Nutrition Goals: Nutrition Therapy & Goals - 12/29/16 1347      Nutrition Therapy   Diet  Heart Healthy      Personal Nutrition Goals   Nutrition Goal  Identify food quantities necessary to achieve wt loss of  -2# per week to a goal wt loss of 6-24 lb at graduation from pulmonary rehab.    Personal Goal #2  Describe the benefit of including fruits, vegetables, whole grains, and low-fat dairy products in a healthy meal plan.      Intervention Plan   Intervention  Prescribe, educate and counsel regarding individualized specific dietary modifications aiming towards targeted core components such as weight, hypertension, lipid management, diabetes, heart failure and other comorbidities.    Expected Outcomes  Short Term Goal: Understand basic principles of dietary content, such as calories, fat, sodium, cholesterol and nutrients.;Long Term Goal: Adherence to prescribed nutrition plan.       Nutrition Assessments: Nutrition Assessments - 12/29/16 1335      Rate Your Plate Scores   Pre Score  33       Nutrition Goals Re-Evaluation:   Nutrition Goals Discharge (Final Nutrition Goals Re-Evaluation):   Psychosocial: Target Goals: Acknowledge presence or absence of significant depression and/or stress, maximize coping skills, provide positive support system. Participant is able to verbalize types and ability to use techniques and skills needed for reducing stress and depression.  Initial Review & Psychosocial Screening: Initial Psych Review & Screening - 11/27/16 1037      Initial Review   Current issues with  None Identified      Family Dynamics   Good Support System?  Yes      Barriers   Psychosocial barriers to participate in program  There are no  identifiable barriers or psychosocial needs.      Screening Interventions   Interventions  Encouraged to exercise       Quality of Life Scores:  Scores of 19 and below usually indicate a poorer quality of life in these areas.  A difference of  2-3 points is a clinically  meaningful difference.  A difference of 2-3 points in the total score of the Quality of Life Index has been associated with significant improvement in overall quality of life, self-image, physical symptoms, and general health in studies assessing change in quality of life.   PHQ-9: Recent Review Flowsheet Data    Depression screen [facility] 2/9 11/27/2016   Decreased Interest 0   Down, Depressed, Hopeless 1   PHQ - 2 Score 1     Interpretation of Total Score  Total Score Depression Severity:  1-4 = Minimal depression, 5-9 = Mild depression, 10-14 = Moderate depression, 15-19 = Moderately severe depression, 20-27 = Severe depression   Psychosocial Evaluation and Intervention: Psychosocial Evaluation - 11/27/16 1037      Psychosocial Evaluation & Interventions   Interventions  Encouraged to exercise with the program and follow exercise prescription    Continue Psychosocial Services   No Follow up required       Psychosocial Re-Evaluation: Psychosocial Re-Evaluation    [place] Name 12/12/16 1002 01/04/17 0952 01/19/17 0943 02/19/17 1046       Psychosocial Re-Evaluation   Current issues with  None Identified  None Identified  None Identified  None Identified    Expected Outcomes  -  -  no barriers to participation in program  no barriers to participation in program    Interventions  Encouraged to attend Pulmonary Rehabilitation for the exercise  Encouraged to attend Pulmonary Rehabilitation for the exercise  Encouraged to attend Pulmonary Rehabilitation for the exercise  Encouraged to attend Pulmonary Rehabilitation for the exercise    Continue Psychosocial Services   No Follow up required  No Follow up required  No Follow  up required  No Follow up required       Psychosocial Discharge (Final Psychosocial Re-Evaluation): Psychosocial Re-Evaluation - 02/19/17 1046      Psychosocial Re-Evaluation   Current issues with  None Identified    Expected Outcomes  no barriers to participation in program    Interventions  Encouraged to attend Pulmonary Rehabilitation for the exercise    Continue Psychosocial Services   No Follow up required       Education: Education Goals: Education classes will be provided on a weekly basis, covering required topics. Participant will state understanding/return demonstration of topics presented.  Learning Barriers/Preferences: Learning Barriers/Preferences - 11/27/16 1028      Learning Barriers/Preferences   Learning Barriers  None    Learning Preferences  Group Instruction       Education Topics: Risk Factor Reduction:  -Group instruction that is supported by a PowerPoint presentation. Instructor discusses the definition of a risk factor, different risk factors for pulmonary disease, and how the heart and lungs work together.     Nutrition for Pulmonary Patient:  -Group instruction provided by PowerPoint slides, verbal discussion, and written materials to support subject matter. The instructor gives an explanation and review of healthy diet recommendations, which includes a discussion on weight management, recommendations for fruit and vegetable consumption, as well as protein, fluid, caffeine, fiber, sodium, sugar, and alcohol. Tips for eating when patients are short of breath are discussed.   Pursed Lip Breathing:  -Group instruction that is supported by demonstration and informational handouts. Instructor discusses the benefits of pursed lip and diaphragmatic breathing and detailed demonstration on how to preform both.     Oxygen Safety:  -Group instruction provided by PowerPoint, verbal discussion, and written material to support subject matter. There is an overview  of "What is Oxygen" and "  Why do we need it".  Instructor also reviews how to create a safe environment for oxygen use, the importance of using oxygen as prescribed, and the risks of noncompliance. There is a brief discussion on traveling with oxygen and resources the patient may utilize.   Oxygen Equipment:  -Group instruction provided by [facility] Staff utilizing handouts, written materials, and equipment demonstrations.   PULMONARY REHAB CHRONIC OBSTRUCTIVE PULMONARY DISEASE from 02/15/2017 in [place]  Date  12/28/16  Educator  Ace Gins  Instruction Review Code  2- meets goals/outcomes      Signs and Symptoms:  -Group instruction provided by written material and verbal discussion to support subject matter. Warning signs and symptoms of infection, stroke, and heart attack are reviewed and when to call the physician/911 reinforced. Tips for preventing the spread of infection discussed.   Advanced Directives:  -Group instruction provided by verbal instruction and written material to support subject matter. Instructor reviews Advanced Directive laws and proper instruction for filling out document.   Pulmonary Video:  -Group video education that reviews the importance of medication and oxygen compliance, exercise, good nutrition, pulmonary hygiene, and pursed lip and diaphragmatic breathing for the pulmonary patient.   Exercise for the Pulmonary Patient:  -Group instruction that is supported by a PowerPoint presentation. Instructor discusses benefits of exercise, core components of exercise, frequency, duration, and intensity of an exercise routine, importance of utilizing pulse oximetry during exercise, safety while exercising, and options of places to exercise outside of rehab.     Pulmonary Medications:  -Verbally interactive group education provided by instructor with focus on inhaled medications and proper administration.   PULMONARY REHAB CHRONIC  OBSTRUCTIVE PULMONARY DISEASE from 02/15/2017 in [place]  Date  02/15/17  Educator  pharm  Instruction Review Code  2- meets goals/outcomes      Anatomy and Physiology of the Respiratory System and Intimacy:  -Group instruction provided by PowerPoint, verbal discussion, and written material to support subject matter. Instructor reviews respiratory cycle and anatomical components of the respiratory system and their functions. Instructor also reviews differences in obstructive and restrictive respiratory diseases with examples of each. Intimacy, Sex, and Sexuality differences are reviewed with a discussion on how relationships can change when diagnosed with pulmonary disease. Common sexual concerns are reviewed.   PULMONARY REHAB CHRONIC OBSTRUCTIVE PULMONARY DISEASE from 02/15/2017 in [place]  Date  01/18/17  Educator  RN  Instruction Review Code  2- meets goals/outcomes      MD DAY -A group question and answer session with a medical doctor that allows participants to ask questions that relate to their pulmonary disease state.   OTHER EDUCATION -Group or individual verbal, written, or video instructions that support the educational goals of the pulmonary rehab program.   PULMONARY REHAB CHRONIC OBSTRUCTIVE PULMONARY DISEASE from 02/15/2017 in [place]  Date  12/07/16  Educator  [employer]  Instruction Review Code  2- Demonstrated Understanding      Knowledge Questionnaire Score: Knowledge Questionnaire Score - 12/05/16 1610      Knowledge Questionnaire Score   Pre Score  11/13       Core Components/Risk Factors/Patient Goals at Admission: Personal Goals and Risk Factors at Admission - 11/27/16 1035      Core Components/Risk Factors/Patient Goals on Admission    Weight Management  Yes    Intervention  Weight Management: Develop a combined nutrition and exercise program designed  to  reach desired caloric intake, while maintaining appropriate intake of nutrient and fiber, sodium and fats, and appropriate energy expenditure required for the weight goal.    Admit Weight  184 lb 15.5 oz (83.9 kg)    Goal Weight: Short Term  179 lb (81.2 kg)    Expected Outcomes  Short Term: Continue to assess and modify interventions until short term weight is achieved    Improve shortness of breath with ADL's  Yes    Intervention  Provide education, individualized exercise plan and daily activity instruction to help decrease symptoms of SOB with activities of daily living.    Expected Outcomes  Short Term: Achieves a reduction of symptoms when performing activities of daily living.    Develop more efficient breathing techniques such as purse lipped breathing and diaphragmatic breathing; and practicing self-pacing with activity  Yes    Intervention  Provide education, demonstration and support about specific breathing techniuqes utilized for more efficient breathing. Include techniques such as pursed lipped breathing, diaphragmatic breathing and self-pacing activity.    Expected Outcomes  Short Term: Participant will be able to demonstrate and use breathing techniques as needed throughout daily activities.    Increase knowledge of respiratory medications and ability to use respiratory devices properly   Yes    Intervention  Provide education and demonstration as needed of appropriate use of medications, inhalers, and oxygen therapy.    Expected Outcomes  Short Term: Achieves understanding of medications use. Understands that oxygen is a medication prescribed by physician. Demonstrates appropriate use of inhaler and oxygen therapy.       Core Components/Risk Factors/Patient Goals Review:  Goals and Risk Factor Review    Row Name 12/12/16 1000 01/04/17 0951 01/19/17 0943 02/19/17 1045       Core Components/Risk Factors/Patient Goals Review   Personal Goals Review  Weight  Management/Obesity;Improve shortness of breath with ADL's;Increase knowledge of respiratory medications and ability to use respiratory devices properly.;Develop more efficient breathing techniques such as purse lipped breathing and diaphragmatic breathing and practicing self-pacing with activity.  Weight Management/Obesity;Improve shortness of breath with ADL's;Increase knowledge of respiratory medications and ability to use respiratory devices properly.;Develop more efficient breathing techniques such as purse lipped breathing and diaphragmatic breathing and practicing self-pacing with activity.  Weight Management/Obesity;Improve shortness of breath with ADL's;Increase knowledge of respiratory medications and ability to use respiratory devices properly.;Develop more efficient breathing techniques such as purse lipped breathing and diaphragmatic breathing and practicing self-pacing with activity.  Weight Management/Obesity;Improve shortness of breath with ADL's;Increase knowledge of respiratory medications and ability to use respiratory devices properly.;Develop more efficient breathing techniques such as purse lipped breathing and diaphragmatic breathing and practicing self-pacing with activity.    Review  Just started program, has attended 2 exercise sessions, too early to see progression toward goals  no weight loss, but strength and stamina have increased and I see major confidance building  no weight loss, but strength and stamina have increased and I see major confidance building  she was absent 3 sessions due to the flu, she is back and regaining her strength, progressing well    Expected Outcomes  In the next 30 days, should see progression toward goals  Continued improvement towards admission goals  Continued improvement towards admission goals  Continued improvement towards admission goals       Core Components/Risk Factors/Patient Goals at Discharge (Final Review):  Goals and Risk Factor Review -  02/19/17 1045      Core Components/Risk Factors/Patient Goals Review  Personal Goals Review  Weight Management/Obesity;Improve shortness of breath with ADL's;Increase knowledge of respiratory medications and ability to use respiratory devices properly.;Develop more efficient breathing techniques such as purse lipped breathing and diaphragmatic breathing and practicing self-pacing with activity.    Review  she was absent 3 sessions due to the flu, she is back and regaining her strength, progressing well    Expected Outcomes  Continued improvement towards admission goals       ITP Comments:   Comments: Patient has attended 12 pulmonary rehab sessions since admission.

## 2017-02-20 NOTE — Progress Notes (Signed)
Daily Session Note  Patient Details  Name: Erin Morrison MRN: [id number] Date of Birth: [birth year] Referring Provider:     Pulmonary Rehab Walk Test from 11/28/2016 in [place]  Referring Provider  Dr. [name]      Encounter Date: 02/20/2017  Check In: Session Check In - 02/20/17 1327      Check-In   Location  [facility]    Staff Present  Rodney Langton, RN;[name], RN, BSN;Molly diVincenzo, MS, ACSM RCEP, Exercise Physiologist;[name] Leonia Reeves, RN, BSN    Supervising physician immediately available to respond to emergencies  [facility] immediately available    Physician(s)  Dr. Zigmund Daniel    Medication changes reported      No    Fall or balance concerns reported     No    Tobacco Cessation  No Change    Warm-up and Cool-down  Performed as group-led instruction    Resistance Training Performed  Yes    VAD Patient?  No      Pain Assessment   Currently in Pain?  No/denies    Multiple Pain Sites  No       Capillary Blood Glucose: No results found for this or any previous visit (from the past 24 hour(s)).  Exercise Prescription Changes - 02/20/17 1500      Response to Exercise   Blood Pressure (Admit)  122/64    Blood Pressure (Exercise)  120/76    Blood Pressure (Exit)  110/70    Heart Rate (Admit)  81 bpm    Heart Rate (Exercise)  97 bpm    Heart Rate (Exit)  78 bpm    Oxygen Saturation (Admit)  96 %    Oxygen Saturation (Exercise)  96 %    Oxygen Saturation (Exit)  96 %    Rating of Perceived Exertion (Exercise)  15    Perceived Dyspnea (Exercise)  3    Duration  Progress to 45 minutes of aerobic exercise without signs/symptoms of physical distress    Intensity  THRR unchanged      Progression   Progression  Continue to progress workloads to maintain intensity without signs/symptoms of physical distress.      Resistance Training   Training Prescription  Yes    Weight  orange bands    Reps  10-15    Time  10 Minutes      Oxygen   Oxygen  Continuous    Liters  2      Recumbant Bike   Level  2    Minutes  17      NuStep   Level  3    Minutes  17    METs  2.1      Track   Laps  7    Minutes  17       Social History   Tobacco Use  Smoking Status Former Smoker  . Packs/day: 1.50  . Years: 46.00  . Pack years: 69.00  . Types: Cigarettes  . Start date: 01/31/1964  . Last attempt to quit: 01/30/2010  . Years since quitting: 7.0  Smokeless Tobacco Never Used    Goals Met:  Exercise tolerated well No report of cardiac concerns or symptoms Strength training completed today  Goals Unmet:  Not Applicable  Comments: Service time is from 1330 to 1500      Dr. [name] is Medical Director for Pulmonary Rehab at [facility].

## 2017-02-22 ENCOUNTER — Telehealth (HOSPITAL_COMMUNITY): Payer: Self-pay | Admitting: Physician Assistant

## 2017-02-22 ENCOUNTER — Encounter (HOSPITAL_COMMUNITY): Payer: Managed Care, Other (non HMO)

## 2017-02-27 ENCOUNTER — Encounter (HOSPITAL_COMMUNITY)
Admission: RE | Admit: 2017-02-27 | Discharge: 2017-02-27 | Disposition: A | Discharge status: Home / Self Care | Payer: Managed Care, Other (non HMO) | Source: Ambulatory Visit | Attending: Pulmonary Disease | Admitting: Pulmonary Disease

## 2017-02-27 VITALS — Wt 183.6 lb

## 2017-02-27 DIAGNOSIS — J449 Chronic obstructive pulmonary disease, unspecified: Secondary | ICD-10-CM | POA: Diagnosis not present

## 2017-02-27 NOTE — Progress Notes (Signed)
Daily Session Note  Patient Details  Name: Erin Morrison MRN: [id number] Date of Birth: [birth year] Referring Provider:     Pulmonary Rehab Walk Test from 11/28/2016 in [place]  Referring Provider  Dr. [name]      Encounter Date: 02/27/2017  Check In: Session Check In - 02/27/17 1544      Check-In   Location  [facility]    Staff Present  [name], RN, BSN;Molly diVincenzo, MS, ACSM RCEP, Exercise Physiologist;[name], RN, BSN    Supervising physician immediately available to respond to emergencies  [facility] immediately available    Physician(s)  Dr. Bonner Puna    Medication changes reported      No    Fall or balance concerns reported     No    Tobacco Cessation  No Change    Warm-up and Cool-down  Performed as group-led instruction    Resistance Training Performed  Yes    VAD Patient?  No      Pain Assessment   Currently in Pain?  No/denies    Multiple Pain Sites  No       Capillary Blood Glucose: No results found for this or any previous visit (from the past 24 hour(s)).  Exercise Prescription Changes - 02/27/17 1600      Response to Exercise   Blood Pressure (Admit)  130/60    Blood Pressure (Exercise)  160/86    Blood Pressure (Exit)  126/62    Heart Rate (Admit)  88 bpm    Heart Rate (Exercise)  111 bpm    Heart Rate (Exit)  85 bpm    Oxygen Saturation (Admit)  95 %    Oxygen Saturation (Exercise)  95 %    Oxygen Saturation (Exit)  95 %    Rating of Perceived Exertion (Exercise)  14    Perceived Dyspnea (Exercise)  3    Duration  Progress to 45 minutes of aerobic exercise without signs/symptoms of physical distress    Intensity  THRR unchanged      Progression   Progression  Continue to progress workloads to maintain intensity without signs/symptoms of physical distress.      Resistance Training   Training Prescription  Yes    Weight  orange bands    Reps  10-15    Time  10 Minutes       Oxygen   Oxygen  Continuous    Liters  2      Recumbant Bike   Level  3    Minutes  17      NuStep   Level  3    Minutes  17    METs  1.8      Track   Laps  12    Minutes  17       Social History   Tobacco Use  Smoking Status Former Smoker  . Packs/day: 1.50  . Years: 46.00  . Pack years: 69.00  . Types: Cigarettes  . Start date: 01/31/1964  . Last attempt to quit: 01/30/2010  . Years since quitting: 7.0  Smokeless Tobacco Never Used    Goals Met:  Exercise tolerated well No report of cardiac concerns or symptoms Strength training completed today  Goals Unmet:  Not Applicable  Comments: Service time is from 1330 to 1510    Dr. [name] is Medical Director for Pulmonary Rehab at [facility].

## 2017-03-01 ENCOUNTER — Encounter (HOSPITAL_COMMUNITY): Payer: Managed Care, Other (non HMO)

## 2017-03-01 ENCOUNTER — Encounter (HOSPITAL_COMMUNITY)
Admission: RE | Admit: 2017-03-01 | Discharge: 2017-03-01 | Disposition: A | Discharge status: Home / Self Care | Payer: Managed Care, Other (non HMO) | Source: Ambulatory Visit | Attending: Pulmonary Disease | Admitting: Pulmonary Disease

## 2017-03-01 VITALS — Wt 185.2 lb

## 2017-03-01 DIAGNOSIS — J449 Chronic obstructive pulmonary disease, unspecified: Secondary | ICD-10-CM

## 2017-03-01 NOTE — Progress Notes (Signed)
Incomplete Session Note  Patient Details  Name: Erin Morrison MRN: [id number] Date of Birth: 01/03/1951 Referring Provider:     [facility]  Referring Provider  Dr. Suzie PortelaNestor      Erin Morrison did not complete her rehab session.  She did attend the Mental Health class today, and performed warm up stretching and resistance training.  She did not exercise d/t someone wearing perfume, which made it difficult for her to breathe.  She went home with her daughter-in-law in no acute distress.She was here from 1230-1400.

## 2017-03-06 ENCOUNTER — Encounter (HOSPITAL_COMMUNITY)
Admission: RE | Admit: 2017-03-06 | Discharge: 2017-03-06 | Disposition: A | Discharge status: Home / Self Care | Payer: Managed Care, Other (non HMO) | Source: Ambulatory Visit | Attending: Pulmonary Disease | Admitting: Pulmonary Disease

## 2017-03-06 VITALS — Wt 184.3 lb

## 2017-03-06 DIAGNOSIS — M545 Low back pain: Secondary | ICD-10-CM | POA: Insufficient documentation

## 2017-03-06 DIAGNOSIS — Z86718 Personal history of other venous thrombosis and embolism: Secondary | ICD-10-CM | POA: Insufficient documentation

## 2017-03-06 DIAGNOSIS — J449 Chronic obstructive pulmonary disease, unspecified: Secondary | ICD-10-CM | POA: Insufficient documentation

## 2017-03-06 DIAGNOSIS — Z87891 Personal history of nicotine dependence: Secondary | ICD-10-CM | POA: Insufficient documentation

## 2017-03-06 DIAGNOSIS — Z79899 Other long term (current) drug therapy: Secondary | ICD-10-CM | POA: Diagnosis not present

## 2017-03-06 DIAGNOSIS — I10 Essential (primary) hypertension: Secondary | ICD-10-CM | POA: Insufficient documentation

## 2017-03-06 NOTE — Progress Notes (Signed)
Daily Session Note  Patient Details  Name: Erin Morrison MRN: [id number] Date of Birth: 11/04/1950 Referring Provider:     Pulmonary Rehab Walk Test from 11/28/2016 in [place]  Referring Provider  Dr. [name]      Encounter Date: 03/06/2017  Check In: Session Check In - 03/06/17 1517      Check-In   Location  [facility]    Staff Present  [name], RN, BSN;Molly diVincenzo, MS, ACSM RCEP, Exercise Physiologist;Sofia Jaquith Ysidro Evert, RN;[name], RN, BSN    Supervising physician immediately available to respond to emergencies  [facility] immediately available    Physician(s)  Dr. Tyrell Antonio    Medication changes reported      No    Fall or balance concerns reported     No    Warm-up and Cool-down  Performed as group-led instruction    Resistance Training Performed  Yes    VAD Patient?  No      Pain Assessment   Currently in Pain?  No/denies    Multiple Pain Sites  No       Capillary Blood Glucose: No results found for this or any previous visit (from the past 24 hour(s)).  Exercise Prescription Changes - 03/06/17 1500      Response to Exercise   Blood Pressure (Admit)  128/62    Blood Pressure (Exercise)  118/60    Blood Pressure (Exit)  110/72    Heart Rate (Admit)  84 bpm    Heart Rate (Exercise)  97 bpm    Heart Rate (Exit)  88 bpm    Oxygen Saturation (Admit)  96 %    Oxygen Saturation (Exercise)  96 %    Oxygen Saturation (Exit)  96 %    Rating of Perceived Exertion (Exercise)  13    Perceived Dyspnea (Exercise)  3    Duration  Progress to 45 minutes of aerobic exercise without signs/symptoms of physical distress    Intensity  THRR unchanged      Progression   Progression  Continue to progress workloads to maintain intensity without signs/symptoms of physical distress.      Resistance Training   Training Prescription  Yes    Weight  orange bands    Reps  10-15    Time  10 Minutes      Oxygen   Oxygen  Continuous    Liters  2      Recumbant Bike   Level  2    Minutes  17      NuStep   Level  3    Minutes  17    METs  2.1      Track   Laps  7    Minutes  17       Social History   Tobacco Use  Smoking Status Former Smoker  . Packs/day: 1.50  . Years: 46.00  . Pack years: 69.00  . Types: Cigarettes  . Start date: 01/31/1964  . Last attempt to quit: 01/30/2010  . Years since quitting: 7.1  Smokeless Tobacco Never Used    Goals Met:  Exercise tolerated well No report of cardiac concerns or symptoms Strength training completed today  Goals Unmet:  Not Applicable  Comments: Service time is from 1330 to 1505    Dr. [name] is Medical Director for Pulmonary Rehab at [facility].

## 2017-03-08 ENCOUNTER — Encounter (HOSPITAL_COMMUNITY)
Admission: RE | Admit: 2017-03-08 | Discharge: 2017-03-08 | Disposition: A | Discharge status: Home / Self Care | Payer: Managed Care, Other (non HMO) | Source: Ambulatory Visit | Attending: Pulmonary Disease | Admitting: Pulmonary Disease

## 2017-03-08 DIAGNOSIS — J449 Chronic obstructive pulmonary disease, unspecified: Secondary | ICD-10-CM | POA: Diagnosis not present

## 2017-03-08 NOTE — Progress Notes (Signed)
Daily Session Note  Patient Details  Name: Erin Morrison MRN: [id number] Date of Birth: [birth year] Referring Provider:     Pulmonary Rehab Walk Test from 11/28/2016 in [place]  Referring Provider  Dr. [name]      Encounter Date: 03/08/2017  Check In: Session Check In - 03/08/17 1031      Check-In   Location  [facility]    Staff Present  [name], RN, BSN;[name], MS, ACSM RCEP, Exercise Physiologist;[name], RN;[name], RN, BSN    Supervising physician immediately available to respond to emergencies  [facility] immediately available    Physician(s)  Dr. [name]    Medication changes reported      No    Fall or balance concerns reported     No    Tobacco Cessation  No Change    Warm-up and Cool-down  Performed as group-led instruction    Resistance Training Performed  Yes    VAD Patient?  No      Pain Assessment   Currently in Pain?  No/denies    Multiple Pain Sites  No       Capillary Blood Glucose: No results found for this or any previous visit (from the past 24 hour(s)).    Social History   Tobacco Use  Smoking Status Former Smoker  . Packs/day: 1.50  . Years: 46.00  . Pack years: 69.00  . Types: Cigarettes  . Start date: 01/31/1964  . Last attempt to quit: 01/30/2010  . Years since quitting: 7.1  Smokeless Tobacco Never Used    Goals Met:  Exercise tolerated well No report of cardiac concerns or symptoms Strength training completed today  Goals Unmet:  Not Applicable  Comments: Service time is from 10:30a to 12:25p    Dr. [name] is Medical Director for Pulmonary Rehab at [facility].

## 2017-03-12 NOTE — Progress Notes (Signed)
Pulmonary Individual Treatment Plan  Patient Details  Name: Erin Morrison MRN: [id number] Date of Birth: 07/26/1950 Referring Provider:     Pulmonary Rehab Walk Test from 11/28/2016 in [facility]  Referring Provider  Dr. [name]      Initial Encounter Date:    Pulmonary Rehab Walk Test from 11/28/2016 in [facility]  Date  11/28/16  Referring Provider  Dr. [name]      Visit Diagnosis: Chronic obstructive pulmonary disease, unspecified COPD type ([place])  Patient's Home Medications on Admission:   Current Outpatient Medications:  .  acetaminophen (TYLENOL) 500 MG tablet, Take 500 mg by mouth 2 (two) times daily as needed for moderate pain or headache., Disp: , Rfl:  .  albuterol (PROVENTIL HFA;VENTOLIN HFA) 108 (90 Base) MCG/ACT inhaler, Inhale 2 puffs into the lungs every 6 (six) hours as needed for wheezing or shortness of breath. , Disp: , Rfl:  .  albuterol (PROVENTIL) (2.5 MG/3ML) 0.083% nebulizer solution, Take 2.5 mg by nebulization every 6 (six) hours as needed for wheezing or shortness of breath., Disp: , Rfl:  .  aspirin 81 MG tablet, Take 81 mg by mouth daily., Disp: , Rfl:  .  atorvastatin (LIPITOR) 40 MG tablet, Take 1 tablet (40 mg total) by mouth daily at 6 PM., Disp: 90 tablet, Rfl: 2 .  budesonide (PULMICORT) 0.5 MG/2ML nebulizer solution, USE 1 VIAL VIA NEBULIZER 2 TIMES DAILY -DX CODE J44.9, Disp: 120 mL, Rfl: 3 .  cetirizine (ZYRTEC) 10 MG tablet, Take 10 mg by mouth daily., Disp: , Rfl:  .  DALIRESP 500 MCG TABS tablet, TAKE 1 TABLET BY MOUTH DAILY., Disp: 30 tablet, Rfl: 3 .  esomeprazole (NEXIUM) 40 MG capsule, Take 40 mg by mouth daily as needed (for acid reflex). , Disp: , Rfl:  .  formoterol (PERFOROMIST) 20 MCG/2ML nebulizer solution, USE 1 VIAL VIA NEBULIZER TWICE A DAY, Disp: 120 mL, Rfl: 3 .  Glycopyrrolate (LONHALA MAGNAIR STARTER KIT) 25 MCG/ML SOLN, Inhale 25 mcg into the lungs 2 (two) times  daily., Disp: 1 mL, Rfl: 6 .  isosorbide mononitrate (IMDUR) 30 MG 24 hr tablet, Take 0.5 tablets (15 mg total) by mouth daily., Disp: 45 tablet, Rfl: 2 .  montelukast (SINGULAIR) 10 MG tablet, TAKE 1 TABLET BY MOUTH AT BEDTIME, Disp: 30 tablet, Rfl: 3 .  nitroGLYCERIN (NITROSTAT) 0.4 MG SL tablet, Place 1 tablet (0.4 mg total) under the tongue every 5 (five) minutes as needed for chest pain., Disp: 90 tablet, Rfl: 3 .  Polyethyl Glycol-Propyl Glycol (SYSTANE OP), Apply 1-2 drops to eye daily as needed (dry eyes)., Disp: , Rfl:  .  predniSONE (DELTASONE) 10 MG tablet, Take 1 tablet (10 mg total) by mouth daily with breakfast., Disp: 30 tablet, Rfl: 1 .  Spacer/Aero-Holding Chambers (AEROCHAMBER MV) inhaler, Use as instructed, Disp: 1 each, Rfl: 0  Past Medical History: Past Medical History:  Diagnosis Date  . Acute DVT (deep venous thrombosis) ([place])   . Allergic rhinitis   . Atrophic vaginitis   . COPD (chronic obstructive pulmonary disease) ([place])   . Hypertension   . Low back pain   . Umbilical hernia     Tobacco Use: Social History   Tobacco Use  Smoking Status Former Smoker  . Packs/day: 1.50  . Years: 46.00  . Pack years: 69.00  . Types: Cigarettes  . Start date: 01/31/1964  . Last attempt to quit: 01/30/2010  . Years since quitting:  7.1  Smokeless Tobacco Never Used    Labs: Recent Review Flowsheet Data    Labs for ITP Cardiac and Pulmonary Rehab Latest Ref Rng & Units 12/04/2015 03/03/2016 03/03/2016 04/05/2016 11/27/2016   Cholestrol 100 - 199 mg/dL 207(H) - - 138 124   LDLCALC 0 - 99 mg/dL 134(H) - - 55 52   HDL >39 mg/dL 57 - - 60 55   Trlycerides 0 - 149 mg/dL 79 - - 115 83   Hemoglobin A1c 4.8 - 5.6 % 5.5 - - - -   PHART 7.350 - 7.450 - - 7.419 - -   PCO2ART 32.0 - 48.0 mmHg - - 34.0 - -   HCO3 20.0 - 28.0 mmol/L - 22.3 22.0 - -   TCO2 0 - 100 mmol/L - 23 23 - -   ACIDBASEDEF 0.0 - 2.0 mmol/L - 2.0 2.0 - -   O2SAT % - 65.0 94.0 - -      Capillary Blood Glucose: No  results found for: GLUCAP   Pulmonary Assessment Scores: Pulmonary Assessment Scores    Row Name 11/30/16 0908 12/05/16 1610       ADL UCSD   ADL Phase  Entry  Entry    SOB Score total  -  60      CAT Score   CAT Score  -  29 Entry      mMRC Score   mMRC Score  3  -       Pulmonary Function Assessment: Pulmonary Function Assessment - 11/27/16 1029      Post Bronchodilator Spirometry Results   FEV1%  69 %    FEV1/FVC Ratio  66      Breath   Bilateral Breath Sounds  Clear    Shortness of Breath  Panic with Shortness of Breath;Limiting activity;Yes       Exercise Target Goals:    Exercise Program Goal: Individual exercise prescription set using results from initial 6 min walk test and THRR while considering  patient's activity barriers and safety.    Exercise Prescription Goal: Initial exercise prescription builds to 30-45 minutes a day of aerobic activity, 2-3 days per week.  Home exercise guidelines will be given to patient during program as part of exercise prescription that the participant will acknowledge.  Activity Barriers & Risk Stratification: Activity Barriers & Cardiac Risk Stratification - 11/27/16 1033      Activity Barriers & Cardiac Risk Stratification   Activity Barriers  -- Occassionally has bilateral hip pain       6 Minute Walk: 6 Minute Walk    Row Name 11/30/16 0859         6 Minute Walk   Phase  Initial     Distance  1300 feet     Walk Time  6 minutes     # of Rest Breaks  0     MPH  2.46     METS  2.91     RPE  13     Perceived Dyspnea   1     Symptoms  Yes (comment)     Comments  wheelchair     Resting HR  92 bpm     Resting BP  134/82     Resting Oxygen Saturation   93 %     Exercise Oxygen Saturation  during 6 min walk  85 %     Max Ex. HR  113 bpm     Max Ex. BP  150/72  Interval HR   1 Minute HR  94     2 Minute HR  93     3 Minute HR  100     4 Minute HR  112     5 Minute HR  113     6 Minute HR  112      2 Minute Post HR  96     Interval Heart Rate?  Yes       Interval Oxygen   Interval Oxygen?  Yes     Baseline Oxygen Saturation %  93 %     1 Minute Oxygen Saturation %  92 %     1 Minute Liters of Oxygen  0 L     2 Minute Oxygen Saturation %  85 %     2 Minute Liters of Oxygen  0 L     3 Minute Oxygen Saturation %  93 %     3 Minute Liters of Oxygen  2 L     4 Minute Oxygen Saturation %  96 %     4 Minute Liters of Oxygen  2 L     5 Minute Oxygen Saturation %  93 %     5 Minute Liters of Oxygen  2 L     6 Minute Oxygen Saturation %  91 %     6 Minute Liters of Oxygen  2 L     2 Minute Post Oxygen Saturation %  96 %     2 Minute Post Liters of Oxygen  2 L        Oxygen Initial Assessment: Oxygen Initial Assessment - 11/30/16 0907      Initial 6 min Walk   Oxygen Used  Continuous;E-Tanks    Liters per minute  2      Program Oxygen Prescription   Program Oxygen Prescription  Continuous;E-Tanks    Liters per minute  2    Comments  started off on room air-85%-titrated to 2 liters       Oxygen Re-Evaluation: Oxygen Re-Evaluation    Row Name 12/12/16 0957 01/04/17 0950 01/19/17 0942 02/19/17 1316 03/09/17 0714     Program Oxygen Prescription   Program Oxygen Prescription  Continuous;E-Tanks  Continuous;E-Tanks  Continuous;E-Tanks  Continuous;E-Tanks  Continuous;E-Tanks   Liters per minute  _0 Comments  2 liters is sufficient at this time while exercising  2 liters is sufficient at this time while exercising  2 liters is sufficient at this time while exercising  2 liters is sufficient at this time while exercising  2 liters is sufficient at this time while exercising     Home Oxygen   Home Oxygen Device  Home Concentrator;E-Tanks  Home Concentrator;E-Tanks  Home Concentrator;E-Tanks  -  Home Concentrator;E-Tanks   Sleep Oxygen Prescription  Continuous  Continuous  Continuous  Continuous  Continuous   Liters per minute  _1 Home Exercise Oxygen  Prescription  Continuous  Continuous  Continuous  Continuous  Continuous   Liters per minute  _2 Home at Rest Exercise Oxygen Prescription  Continuous  Continuous  Continuous  Continuous  Continuous   Liters per minute  _3 Compliance with Home Oxygen Use  Yes  Yes  Yes  Yes  Yes  Goals/Expected Outcomes   Short Term Goals  To learn and exhibit compliance with exercise, home and travel O2 prescription  To learn and exhibit compliance with exercise, home and travel O2 prescription  To learn and exhibit compliance with exercise, home and travel O2 prescription;To learn and understand importance of monitoring SPO2 with pulse oximeter and demonstrate accurate use of the pulse oximeter.;To learn and understand importance of maintaining oxygen saturations>88%;To learn and demonstrate proper pursed lip breathing techniques or other breathing techniques.;To learn and demonstrate proper use of respiratory medications  To learn and exhibit compliance with exercise, home and travel O2 prescription;To learn and understand importance of monitoring SPO2 with pulse oximeter and demonstrate accurate use of the pulse oximeter.;To learn and understand importance of maintaining oxygen saturations>88%;To learn and demonstrate proper pursed lip breathing techniques or other breathing techniques.;To learn and demonstrate proper use of respiratory medications  To learn and exhibit compliance with exercise, home and travel O2 prescription;To learn and understand importance of monitoring SPO2 with pulse oximeter and demonstrate accurate use of the pulse oximeter.;To learn and understand importance of maintaining oxygen saturations>88%;To learn and demonstrate proper pursed lip breathing techniques or other breathing techniques.;To learn and demonstrate proper use of respiratory medications   Long  Term Goals  Exhibits compliance with exercise, home and travel O2 prescription  Exhibits compliance with  exercise, home and travel O2 prescription  Exhibits compliance with exercise, home and travel O2 prescription;Verbalizes importance of monitoring SPO2 with pulse oximeter and return demonstration;Maintenance of O2 saturations>88%;Exhibits proper breathing techniques, such as pursed lip breathing or other method taught during program session;Compliance with respiratory medication;Demonstrates proper use of MDI's  Exhibits compliance with exercise, home and travel O2 prescription;Verbalizes importance of monitoring SPO2 with pulse oximeter and return demonstration;Maintenance of O2 saturations>88%;Exhibits proper breathing techniques, such as pursed lip breathing or other method taught during program session;Compliance with respiratory medication;Demonstrates proper use of MDI's  Exhibits compliance with exercise, home and travel O2 prescription;Verbalizes importance of monitoring SPO2 with pulse oximeter and return demonstration;Maintenance of O2 saturations>88%;Exhibits proper breathing techniques, such as pursed lip breathing or other method taught during program session;Compliance with respiratory medication;Demonstrates proper use of MDI's   Comments  Just started program, 2 liters is sufficient while exercising.  compliant with oxygen use  compliant with oxygen use  compliant with oxygen use  compliant with oxygen use   Goals/Expected Outcomes  Continue compliance with oxygen useage  continued compliance  continued compliance  continued compliance  continued compliance      Oxygen Discharge (Final Oxygen Re-Evaluation): Oxygen Re-Evaluation - 03/09/17 0714      Program Oxygen Prescription   Program Oxygen Prescription  Continuous;E-Tanks    Liters per minute  2    Comments  2 liters is sufficient at this time while exercising      Home Oxygen   Home Oxygen Device  Home Concentrator;E-Tanks    Sleep Oxygen Prescription  Continuous    Liters per minute  2    Home Exercise Oxygen Prescription   Continuous    Liters per minute  2    Home at Rest Exercise Oxygen Prescription  Continuous    Liters per minute  2    Compliance with Home Oxygen Use  Yes      Goals/Expected Outcomes   Short Term Goals  To learn and exhibit compliance with exercise, home and travel O2 prescription;To learn and understand importance of monitoring SPO2 with pulse oximeter and demonstrate accurate use of the pulse oximeter.;To  learn and understand importance of maintaining oxygen saturations>88%;To learn and demonstrate proper pursed lip breathing techniques or other breathing techniques.;To learn and demonstrate proper use of respiratory medications    Long  Term Goals  Exhibits compliance with exercise, home and travel O2 prescription;Verbalizes importance of monitoring SPO2 with pulse oximeter and return demonstration;Maintenance of O2 saturations>88%;Exhibits proper breathing techniques, such as pursed lip breathing or other method taught during program session;Compliance with respiratory medication;Demonstrates proper use of MDI's    Comments  compliant with oxygen use    Goals/Expected Outcomes  continued compliance       Initial Exercise Prescription: Initial Exercise Prescription - 11/30/16 0900      Date of Initial Exercise RX and Referring Provider   Date  11/28/16    Referring Provider  Dr. [name]      Oxygen   Oxygen  Continuous    Liters  2      Recumbant Bike   Level  1    Watts  15    Minutes  17      NuStep   Level  1    Minutes  17    METs  1.4      Track   Laps  10    Minutes  17      Prescription Details   Frequency (times per week)  2    Duration  Progress to 45 minutes of aerobic exercise without signs/symptoms of physical distress      Intensity   THRR 40-80% of Max Heartrate  61-122    Ratings of Perceived Exertion  11-13    Perceived Dyspnea  0-4      Progression   Progression  Continue progressive overload as per policy without signs/symptoms or physical  distress.      Resistance Training   Training Prescription  Yes    Weight  orange bands    Reps  10-15       Perform Capillary Blood Glucose checks as needed.  Exercise Prescription Changes: Exercise Prescription Changes    Row Name 12/05/16 1500 12/07/16 1300 12/12/16 1500 12/19/16 1500 12/26/16 1500     Response to Exercise   Blood Pressure (Admit)  114/64  132/70  110/50  124/62  100/60   Blood Pressure (Exercise)  132/64  210/100 recovered to 130/74  120/66  120/66  160/90   Blood Pressure (Exit)  120/70  122/70  104/60  108/60  132/66   Heart Rate (Admit)  86 bpm  83 bpm  77 bpm  88 bpm  80 bpm   Heart Rate (Exercise)  102 bpm  104 bpm  122 bpm  125 bpm  115 bpm   Heart Rate (Exit)  79 bpm  84 bpm  90 bpm  75 bpm  88 bpm   Oxygen Saturation (Admit)  95 %  95 %  96 %  95 %  95 %   Oxygen Saturation (Exercise)  96 %  96 %  94 %  94 %  95 %   Oxygen Saturation (Exit)  94 %  96 %  94 %  96 %  92 %   Rating of Perceived Exertion (Exercise)  _0 Perceived Dyspnea (Exercise)  _1 Duration  Progress to 45 minutes of aerobic exercise without signs/symptoms of physical distress  Progress to 45 minutes of aerobic exercise without signs/symptoms of physical  distress  Progress to 45 minutes of aerobic exercise without signs/symptoms of physical distress  Progress to 45 minutes of aerobic exercise without signs/symptoms of physical distress  Progress to 45 minutes of aerobic exercise without signs/symptoms of physical distress   Intensity  Other (comment) 40-80% of HRR  Other (comment) 40-80% of HRR  THRR unchanged  THRR unchanged  THRR unchanged     Progression   Progression  Continue to progress workloads to maintain intensity without signs/symptoms of physical distress.  Continue to progress workloads to maintain intensity without signs/symptoms of physical distress.  Continue to progress workloads to maintain intensity without signs/symptoms of physical  distress.  Continue to progress workloads to maintain intensity without signs/symptoms of physical distress.  Continue to progress workloads to maintain intensity without signs/symptoms of physical distress.     Resistance Training   Training Prescription  Yes  Yes  Yes  Yes  Yes   Weight  orange bands  orange bands  orange bands  orange bands  orange bands   Reps  10-15  10-15  10-15  10-15  10-15   Time  10 Minutes  10 Minutes  10 Minutes  10 Minutes  10 Minutes     Oxygen   Oxygen  Continuous  Continuous  Continuous  Continuous  Continuous   Liters  _0 Recumbant Bike   Level  -  _1 Watts  -  15  -  -  -   Minutes  -  _2 NuStep   Level  _3 Minutes  51  _4 METs  2  1.9  2.1  -  -     Track   Laps  -  -  _5 Minutes  -  -  _6 Home Exercise Plan   Plans to continue exercise at  -  -  -  -  Home (comment)   Frequency  -  -  -  -  Add 2 additional days to program exercise sessions.   Row Name 12/28/16 1300 01/04/17 1200 01/16/17 1500 01/18/17 1200 01/25/17 1249     Response to Exercise   Blood Pressure (Admit)  140/70  116/74  134/70  132/76  122/70   Blood Pressure (Exercise)  136/64  124/70  188/86  132/70  148/62   Blood Pressure (Exit)  116/80  120/80  122/78  110/64  102/58   Heart Rate (Admit)  80 bpm  93 bpm  81 bpm  69 bpm  85 bpm   Heart Rate (Exercise)  105 bpm  127 bpm  106 bpm  134 bpm  124 bpm   Heart Rate (Exit)  91 bpm  95 bpm  90 bpm  84 bpm  91 bpm   Oxygen Saturation (Admit)  94 %  94 %  97 %  98 %  99 %   Oxygen Saturation (Exercise)  96 %  95 %  96 %  94 %  95 %   Oxygen Saturation (Exit)  95 %  94 %  95 %  95 %  96 %   Rating of Perceived Exertion (Exercise)  15  13  _0 Perceived Dyspnea (Exercise)  _1 Duration  Progress to 45 minutes of aerobic exercise without signs/symptoms of physical distress  Progress to 45 minutes of aerobic exercise without  signs/symptoms of physical distress  Progress to 45 minutes of aerobic exercise without signs/symptoms of physical distress  Progress to 45 minutes of aerobic exercise without signs/symptoms of physical distress  Progress to 45 minutes of aerobic exercise without signs/symptoms of physical distress   Intensity  THRR unchanged  THRR unchanged  THRR unchanged  THRR unchanged  THRR unchanged     Progression   Progression  Continue to progress workloads to maintain intensity without signs/symptoms of physical distress.  Continue to progress workloads to maintain intensity without signs/symptoms of physical distress.  Continue to progress workloads to maintain intensity without signs/symptoms of physical distress.  Continue to progress workloads to maintain intensity without signs/symptoms of physical distress.  Continue to progress workloads to maintain intensity without signs/symptoms of physical distress.     Resistance Training   Training Prescription  Yes  Yes  Yes  Yes  Yes   Weight  orange bands  orange bands  orange bands  orange bands  orange bands   Reps  10-15  10-15  10-15  10-15  10-15   Time  10 Minutes  10 Minutes  10 Minutes  10 Minutes  10 Minutes     Oxygen   Oxygen  Continuous  Continuous  Continuous  Continuous  Continuous   Liters  _2 Recumbant Bike   Level  -  -  _3 Minutes  -  -  _4 NuStep   Level  _5 -  3   Minutes  _6 -  17   METs  -  1.9  2.1  -  2     Track   Laps  _7 Minutes  _8 Row Name 02/13/17 1511 02/15/17 1200 02/20/17 1500 02/27/17 1600 03/06/17 1500     Response to Exercise   Blood Pressure (Admit)  134/64  112/60  122/64  130/60  128/62   Blood Pressure (Exercise)  170/70  120/70  120/76  160/86  118/60   Blood Pressure (Exit)  120/58  96/70  110/70  126/62  110/72   Heart Rate (Admit)  70 bpm  74 bpm  81 bpm  88 bpm  84 bpm   Heart Rate (Exercise)  119 bpm  95 bpm  97  bpm  111 bpm  97 bpm   Heart Rate (Exit)  93 bpm  82 bpm  78 bpm  85 bpm  88 bpm   Oxygen Saturation (Admit)  97 %  93 %  96 %  95 %  96 %   Oxygen Saturation (Exercise)  95 %  96 %  96 %  95 %  96 %   Oxygen Saturation (Exit)  96 %  96 %  96 %  95 %  96 %   Rating of Perceived Exertion (Exercise)  _9 Perceived Dyspnea (Exercise)  2  2  _0 Duration  Progress to 45 minutes of aerobic exercise without signs/symptoms of physical distress  Progress to 45 minutes of aerobic exercise without signs/symptoms of physical distress  Progress to 45 minutes of aerobic exercise without signs/symptoms of physical distress  Progress to 45 minutes of aerobic exercise without signs/symptoms of physical distress  Progress to 45 minutes of aerobic exercise without signs/symptoms of physical distress   Intensity  THRR unchanged  THRR unchanged  THRR unchanged  THRR unchanged  THRR unchanged     Progression   Progression  Continue to progress workloads to maintain intensity without signs/symptoms of physical distress.  Continue to progress workloads to maintain intensity without signs/symptoms of physical distress.  Continue to progress workloads to maintain intensity without signs/symptoms of physical distress.  Continue to progress workloads to maintain intensity without signs/symptoms of physical distress.  Continue to progress workloads to maintain intensity without signs/symptoms of physical distress.     Resistance Training   Training Prescription  Yes  Yes  Yes  Yes  Yes   Weight  orange bands  orange bands  orange bands  orange bands  orange bands   Reps  10-15  10-15  10-15  10-15  10-15   Time  10 Minutes  10 Minutes  10 Minutes  10 Minutes  10 Minutes     Oxygen   Oxygen  Continuous  Continuous  Continuous  Continuous  Continuous   Liters  _1 Recumbant Bike   Level  2  -  _2 Minutes  17  -  _3 NuStep   Level  _4 Minutes  _5 METs  2.1  2.2  2.1  1.8  2.1     Track   Laps  _6 Minutes  _7 Exercise Comments: Exercise Comments    Row Name 12/28/16 0717           Exercise Comments  Home exercise completed          Exercise Goals and Review:   Exercise Goals Re-Evaluation : Exercise Goals Re-Evaluation    Row Name 12/12/16 3202 01/01/17 1614 01/18/17 1012 02/19/17 0725 03/09/17 0714     Exercise Goal Re-Evaluation   Exercise Goals Review  Increase Physical Activity;Increase Strength and Stamina;Able to understand and use Dyspnea scale;Able to understand and use rate of perceived exertion (RPE) scale;Knowledge and understanding of Target Heart Rate Range (THRR);Understanding of Exercise Prescription  Increase Physical Activity;Increase Strength and Stamina;Able to understand and use Dyspnea scale;Able to understand and use rate of perceived exertion (RPE) scale;Knowledge and understanding of Target Heart Rate Range (THRR);Understanding of Exercise Prescription  Increase Strength and Stamina;Able to understand and use Dyspnea scale;Increase Physical Activity;Able to understand and use rate of perceived exertion (RPE) scale;Knowledge and understanding of Target Heart Rate Range (THRR);Understanding of Exercise Prescription  Increase Physical Activity;Able to understand and use rate of perceived exertion (RPE) scale;Knowledge and understanding of Target Heart Rate Range (THRR);Understanding of Exercise Prescription;Increase Strength and Stamina;Able to understand and use Dyspnea scale  Increase Physical Activity;Able to understand and use rate of perceived  exertion (RPE) scale;Knowledge and understanding of Target Heart Rate Range (THRR);Understanding of Exercise Prescription;Increase Strength and Stamina;Able to understand and use Dyspnea scale   Comments  Patient has only attended 2 exercise sessions. Will cont. to monitor and progress as able.   Patient is making  slow and steady gains. Patient states that she can see an improvement. Will cont. to monitor and progress as able.   Patient is making slow and steady gains. Patient states that she can see an improvement. Will cont. to monitor and progress as able.   Patient is making slow and steady gains. Is able to walk up to 11 laps (200 ft each) in 15 minutes. Patient states that she can see an improvement. Will encourage home exercise. Will cont. to monitor and progress as able.   Patient is making slow and steady gains. Is able to walk up to 15 laps (200 ft each) in 15 minutes. Patient states that she can see an improvement. Will encourage home exercise. Will cont. to monitor and progress as able. Anticipated graduation date is 03/15/17.    Expected Outcomes  Through exercise at rehab and at home, patient will increase strength and stamina and find that ADL's are easier to preform.   Through exercise at rehab and at home, patient will increase strength and stamina and find that ADL's are easier to preform.    Through exercise at rehab and at home, patient will increase strength and stamina and be able to perform ADL's easier.   Through exercise at rehab and at home, patient will increase strength and stamina and be less short of breath with ADL's at home.   Through exercise at rehab and at home, patient will increase strength and stamina and be less short of breath with ADL's at home.       Discharge Exercise Prescription (Final Exercise Prescription Changes): Exercise Prescription Changes - 03/06/17 1500      Response to Exercise   Blood Pressure (Admit)  128/62    Blood Pressure (Exercise)  118/60    Blood Pressure (Exit)  110/72    Heart Rate (Admit)  84 bpm    Heart Rate (Exercise)  97 bpm    Heart Rate (Exit)  88 bpm    Oxygen Saturation (Admit)  96 %    Oxygen Saturation (Exercise)  96 %    Oxygen Saturation (Exit)  96 %    Rating of Perceived Exertion (Exercise)  13    Perceived Dyspnea (Exercise)  3     Duration  Progress to 45 minutes of aerobic exercise without signs/symptoms of physical distress    Intensity  THRR unchanged      Progression   Progression  Continue to progress workloads to maintain intensity without signs/symptoms of physical distress.      Resistance Training   Training Prescription  Yes    Weight  orange bands    Reps  10-15    Time  10 Minutes      Oxygen   Oxygen  Continuous    Liters  2      Recumbant Bike   Level  2    Minutes  17      NuStep   Level  3    Minutes  17    METs  2.1      Track   Laps  7    Minutes  17       Nutrition:  Target Goals: Understanding of nutrition guidelines, daily intake  of sodium <152m, cholesterol <2042m calories 30% from fat and 7% or less from saturated fats, daily to have 5 or more servings of fruits and vegetables.  Biometrics: Pre Biometrics - 11/27/16 1034      Pre Biometrics   Height  5' 2" (1.575 m)    Weight  184 lb 15.5 oz (83.9 kg)    BMI (Calculated)  33.82    Grip Strength  31 kg        Nutrition Therapy Plan and Nutrition Goals: Nutrition Therapy & Goals - 12/29/16 1347      Nutrition Therapy   Diet  Heart Healthy      Personal Nutrition Goals   Nutrition Goal  Identify food quantities necessary to achieve wt loss of  -2# per week to a goal wt loss of 6-24 lb at graduation from pulmonary rehab.    Personal Goal #2  Describe the benefit of including fruits, vegetables, whole grains, and low-fat dairy products in a healthy meal plan.      Intervention Plan   Intervention  Prescribe, educate and counsel regarding individualized specific dietary modifications aiming towards targeted core components such as weight, hypertension, lipid management, diabetes, heart failure and other comorbidities.    Expected Outcomes  Short Term Goal: Understand basic principles of dietary content, such as calories, fat, sodium, cholesterol and nutrients.;Long Term Goal: Adherence to prescribed nutrition  plan.       Nutrition Assessments: Nutrition Assessments - 12/29/16 1335      Rate Your Plate Scores   Pre Score  33       Nutrition Goals Re-Evaluation:   Nutrition Goals Discharge (Final Nutrition Goals Re-Evaluation):   Psychosocial: Target Goals: Acknowledge presence or absence of significant depression and/or stress, maximize coping skills, provide positive support system. Participant is able to verbalize types and ability to use techniques and skills needed for reducing stress and depression.  Initial Review & Psychosocial Screening: Initial Psych Review & Screening - 11/27/16 1037      Initial Review   Current issues with  None Identified      Family Dynamics   Good Support System?  Yes      Barriers   Psychosocial barriers to participate in program  There are no identifiable barriers or psychosocial needs.      Screening Interventions   Interventions  Encouraged to exercise       Quality of Life Scores:  Scores of 19 and below usually indicate a poorer quality of life in these areas.  A difference of  2-3 points is a clinically meaningful difference.  A difference of 2-3 points in the total score of the Quality of Life Index has been associated with significant improvement in overall quality of life, self-image, physical symptoms, and general health in studies assessing change in quality of life.   PHQ-9: Recent Review Flowsheet Data    [facility] 11/27/2016   Decreased Interest 0   Down, Depressed, Hopeless 1   PHQ - 2 Score 1     Interpretation of Total Score  Total Score Depression Severity:  1-4 = Minimal depression, 5-9 = Mild depression, 10-14 = Moderate depression, 15-19 = Moderately severe depression, 20-27 = Severe depression   Psychosocial Evaluation and Intervention: Psychosocial Evaluation - 11/27/16 1037      Psychosocial Evaluation & Interventions   Interventions  Encouraged to exercise with the program and follow  exercise prescription    Continue Psychosocial Services   No Follow up required  Psychosocial Re-Evaluation: Psychosocial Re-Evaluation    Row Name 12/12/16 1002 01/04/17 4496 01/19/17 0943 02/19/17 1046 03/12/17 1438     Psychosocial Re-Evaluation   Current issues with  None Identified  None Identified  None Identified  None Identified  None Identified   Expected Outcomes  -  -  no barriers to participation in program  no barriers to participation in program  no barriers to participation in program   Interventions  Encouraged to attend Pulmonary Rehabilitation for the exercise  Encouraged to attend Pulmonary Rehabilitation for the exercise  Encouraged to attend Pulmonary Rehabilitation for the exercise  Encouraged to attend Pulmonary Rehabilitation for the exercise  Encouraged to attend Pulmonary Rehabilitation for the exercise   Continue Psychosocial Services   No Follow up required  No Follow up required  No Follow up required  No Follow up required  No Follow up required      Psychosocial Discharge (Final Psychosocial Re-Evaluation): Psychosocial Re-Evaluation - 03/12/17 1438      Psychosocial Re-Evaluation   Current issues with  None Identified    Expected Outcomes  no barriers to participation in program    Interventions  Encouraged to attend Pulmonary Rehabilitation for the exercise    Continue Psychosocial Services   No Follow up required       Education: Education Goals: Education classes will be provided on a weekly basis, covering required topics. Participant will state understanding/return demonstration of topics presented.  Learning Barriers/Preferences: Learning Barriers/Preferences - 11/27/16 1028      Learning Barriers/Preferences   Learning Barriers  None    Learning Preferences  Group Instruction       Education Topics: Risk Factor Reduction:  -Group instruction that is supported by a PowerPoint presentation. Instructor discusses the definition of a  risk factor, different risk factors for pulmonary disease, and how the heart and lungs work together.     Nutrition for Pulmonary Patient:  -Group instruction provided by PowerPoint slides, verbal discussion, and written materials to support subject matter. The instructor gives an explanation and review of healthy diet recommendations, which includes a discussion on weight management, recommendations for fruit and vegetable consumption, as well as protein, fluid, caffeine, fiber, sodium, sugar, and alcohol. Tips for eating when patients are short of breath are discussed.   Pursed Lip Breathing:  -Group instruction that is supported by demonstration and informational handouts. Instructor discusses the benefits of pursed lip and diaphragmatic breathing and detailed demonstration on how to preform both.     Oxygen Safety:  -Group instruction provided by PowerPoint, verbal discussion, and written material to support subject matter. There is an overview of "What is Oxygen" and "Why do we need it".  Instructor also reviews how to create a safe environment for oxygen use, the importance of using oxygen as prescribed, and the risks of noncompliance. There is a brief discussion on traveling with oxygen and resources the patient may utilize.   PULMONARY REHAB CHRONIC OBSTRUCTIVE PULMONARY DISEASE from 03/08/2017 in [place]  Date  03/08/17  Educator  RN  Instruction Review Code  2- Demonstrated Understanding      Oxygen Equipment:  -Group instruction provided by [facility] Staff utilizing handouts, written materials, and equipment demonstrations.   PULMONARY REHAB CHRONIC OBSTRUCTIVE PULMONARY DISEASE from 03/08/2017 in [place]  Date  12/28/16  Educator  [employer]      Signs and Symptoms:  -Group instruction provided by written material and verbal discussion to  support subject matter. Warning signs and symptoms of infection, stroke,  and heart attack are reviewed and when to call the physician/911 reinforced. Tips for preventing the spread of infection discussed.   Advanced Directives:  -Group instruction provided by verbal instruction and written material to support subject matter. Instructor reviews Advanced Directive laws and proper instruction for filling out document.   Pulmonary Video:  -Group video education that reviews the importance of medication and oxygen compliance, exercise, good nutrition, pulmonary hygiene, and pursed lip and diaphragmatic breathing for the pulmonary patient.   Exercise for the Pulmonary Patient:  -Group instruction that is supported by a PowerPoint presentation. Instructor discusses benefits of exercise, core components of exercise, frequency, duration, and intensity of an exercise routine, importance of utilizing pulse oximetry during exercise, safety while exercising, and options of places to exercise outside of rehab.     Pulmonary Medications:  -Verbally interactive group education provided by instructor with focus on inhaled medications and proper administration.   PULMONARY REHAB CHRONIC OBSTRUCTIVE PULMONARY DISEASE from 03/08/2017 in [place]  Date  02/15/17  Educator  pharm      Anatomy and Physiology of the Respiratory System and Intimacy:  -Group instruction provided by PowerPoint, verbal discussion, and written material to support subject matter. Instructor reviews respiratory cycle and anatomical components of the respiratory system and their functions. Instructor also reviews differences in obstructive and restrictive respiratory diseases with examples of each. Intimacy, Sex, and Sexuality differences are reviewed with a discussion on how relationships can change when diagnosed with pulmonary disease. Common sexual concerns are reviewed.   PULMONARY REHAB CHRONIC OBSTRUCTIVE PULMONARY DISEASE from 03/08/2017 in [place]  Date  01/18/17  Educator  RN      MD DAY -A group question and answer session with a medical doctor that allows participants to ask questions that relate to their pulmonary disease state.   OTHER EDUCATION -Group or individual verbal, written, or video instructions that support the educational goals of the pulmonary rehab program.   PULMONARY REHAB CHRONIC OBSTRUCTIVE PULMONARY DISEASE from 03/08/2017 in [place]  Date  12/07/16  Educator  [employer]  Instruction Review Code  2- Demonstrated Understanding      Holiday Eating Survival Tips:  -Group instruction provided by PowerPoint slides, verbal discussion, and written materials to support subject matter. The instructor gives patients tips, tricks, and techniques to help them not only survive but enjoy the holidays despite the onslaught of food that accompanies the holidays.   Knowledge Questionnaire Score: Knowledge Questionnaire Score - 12/05/16 1610      Knowledge Questionnaire Score   Pre Score  11/13       Core Components/Risk Factors/Patient Goals at Admission: Personal Goals and Risk Factors at Admission - 11/27/16 1035      Core Components/Risk Factors/Patient Goals on Admission    Weight Management  Yes    Intervention  Weight Management: Develop a combined nutrition and exercise program designed to reach desired caloric intake, while maintaining appropriate intake of nutrient and fiber, sodium and fats, and appropriate energy expenditure required for the weight goal.    Admit Weight  184 lb 15.5 oz (83.9 kg)    Goal Weight: Short Term  179 lb (81.2 kg)    Expected Outcomes  Short Term: Continue to assess and modify interventions until short term weight is achieved    Improve shortness of breath with ADL's  Yes  Intervention  Provide education, individualized exercise plan and daily activity instruction to help decrease symptoms of SOB with activities of daily living.     Expected Outcomes  Short Term: Achieves a reduction of symptoms when performing activities of daily living.    Develop more efficient breathing techniques such as purse lipped breathing and diaphragmatic breathing; and practicing self-pacing with activity  Yes    Intervention  Provide education, demonstration and support about specific breathing techniuqes utilized for more efficient breathing. Include techniques such as pursed lipped breathing, diaphragmatic breathing and self-pacing activity.    Expected Outcomes  Short Term: Participant will be able to demonstrate and use breathing techniques as needed throughout daily activities.    Increase knowledge of respiratory medications and ability to use respiratory devices properly   Yes    Intervention  Provide education and demonstration as needed of appropriate use of medications, inhalers, and oxygen therapy.    Expected Outcomes  Short Term: Achieves understanding of medications use. Understands that oxygen is a medication prescribed by physician. Demonstrates appropriate use of inhaler and oxygen therapy.       Core Components/Risk Factors/Patient Goals Review:  Goals and Risk Factor Review    Row Name 12/12/16 1000 01/04/17 0951 01/19/17 0943 02/19/17 1045 03/12/17 1435     Core Components/Risk Factors/Patient Goals Review   Personal Goals Review  Weight Management/Obesity;Improve shortness of breath with ADL's;Increase knowledge of respiratory medications and ability to use respiratory devices properly.;Develop more efficient breathing techniques such as purse lipped breathing and diaphragmatic breathing and practicing self-pacing with activity.  Weight Management/Obesity;Improve shortness of breath with ADL's;Increase knowledge of respiratory medications and ability to use respiratory devices properly.;Develop more efficient breathing techniques such as purse lipped breathing and diaphragmatic breathing and practicing self-pacing with activity.   Weight Management/Obesity;Improve shortness of breath with ADL's;Increase knowledge of respiratory medications and ability to use respiratory devices properly.;Develop more efficient breathing techniques such as purse lipped breathing and diaphragmatic breathing and practicing self-pacing with activity.  Weight Management/Obesity;Improve shortness of breath with ADL's;Increase knowledge of respiratory medications and ability to use respiratory devices properly.;Develop more efficient breathing techniques such as purse lipped breathing and diaphragmatic breathing and practicing self-pacing with activity.  Weight Management/Obesity;Improve shortness of breath with ADL's;Increase knowledge of respiratory medications and ability to use respiratory devices properly.;Develop more efficient breathing techniques such as purse lipped breathing and diaphragmatic breathing and practicing self-pacing with activity.   Review  Just started program, has attended 2 exercise sessions, too early to see progression toward goals  no weight loss, but strength and stamina have increased and I see major confidance building  no weight loss, but strength and stamina have increased and I see major confidance building  she was absent 3 sessions due to the flu, she is back and regaining her strength, progressing well  progressing well, walking 15 laps on the track, level 2 on recumbent bike, level 3 on nustep.   Expected Outcomes  In the next 30 days, should see progression toward goals  Continued improvement towards admission goals  Continued improvement towards admission goals  Continued improvement towards admission goals  Continued improvement towards admission goals      Core Components/Risk Factors/Patient Goals at Discharge (Final Review):  Goals and Risk Factor Review - 03/12/17 1435      Core Components/Risk Factors/Patient Goals Review   Personal Goals Review  Weight Management/Obesity;Improve shortness of breath with  ADL's;Increase knowledge of respiratory medications and ability to use respiratory devices properly.;Develop more efficient  breathing techniques such as purse lipped breathing and diaphragmatic breathing and practicing self-pacing with activity.    Review  progressing well, walking 15 laps on the track, level 2 on recumbent bike, level 3 on nustep.    Expected Outcomes  Continued improvement towards admission goals       ITP Comments:   Comments: ITP REVIEW Pt is making expected progress toward pulmonary rehab goals after completing 16 sessions. Recommend continued exercise, life style modification, education, and utilization of breathing techniques to increase stamina and strength and decrease shortness of breath with exertion.

## 2017-03-13 ENCOUNTER — Encounter (HOSPITAL_COMMUNITY)
Admission: RE | Admit: 2017-03-13 | Discharge: 2017-03-13 | Disposition: A | Discharge status: Home / Self Care | Payer: Managed Care, Other (non HMO) | Source: Ambulatory Visit | Attending: Pulmonary Disease | Admitting: Pulmonary Disease

## 2017-03-13 DIAGNOSIS — J449 Chronic obstructive pulmonary disease, unspecified: Secondary | ICD-10-CM | POA: Diagnosis not present

## 2017-03-13 NOTE — Progress Notes (Signed)
Daily Session Note  Patient Details  Name: Erin Morrison MRN: [id number] Date of Birth: October 23, 1950 Referring Provider:     Pulmonary Rehab Walk Test from 11/28/2016 in [place]  Referring Provider  Dr. [name]      Encounter Date: 03/13/2017  Check In: Session Check In - 03/13/17 1330      Check-In   Location  [facility]    Staff Present  [name], RN, Luisa Hart, RN, BSN;Molly diVincenzo, MS, ACSM RCEP, Exercise Physiologist    Supervising physician immediately available to respond to emergencies  [facility] immediately available    Physician(s)  Dr. Lonny Prude    Medication changes reported      No    Fall or balance concerns reported     No    Tobacco Cessation  No Change    Warm-up and Cool-down  Performed as group-led instruction    Resistance Training Performed  Yes    VAD Patient?  No      Pain Assessment   Currently in Pain?  No/denies    Multiple Pain Sites  No       Capillary Blood Glucose: No results found for this or any previous visit (from the past 24 hour(s)).    Social History   Tobacco Use  Smoking Status Former Smoker  . Packs/day: 1.50  . Years: 46.00  . Pack years: 69.00  . Types: Cigarettes  . Start date: 01/31/1964  . Last attempt to quit: 01/30/2010  . Years since quitting: 7.1  Smokeless Tobacco Never Used    Goals Met:  Improved SOB with ADL's Using PLB without cueing & demonstrates good technique Exercise tolerated well No report of cardiac concerns or symptoms Strength training completed today  Goals Unmet:  Not Applicable  Comments: Service time is from 1330 to 1510   Dr. [name] is Medical Director for Pulmonary Rehab at [facility]

## 2017-03-15 ENCOUNTER — Encounter (HOSPITAL_COMMUNITY)
Admission: RE | Admit: 2017-03-15 | Discharge: 2017-03-15 | Disposition: A | Discharge status: Home / Self Care | Payer: Managed Care, Other (non HMO) | Source: Ambulatory Visit | Attending: Pulmonary Disease | Admitting: Pulmonary Disease

## 2017-03-15 ENCOUNTER — Other Ambulatory Visit: Payer: Self-pay | Admitting: Interventional Cardiology

## 2017-03-15 DIAGNOSIS — J449 Chronic obstructive pulmonary disease, unspecified: Secondary | ICD-10-CM | POA: Diagnosis not present

## 2017-03-15 NOTE — Progress Notes (Signed)
Daily Session Note  Patient Details  Name: KIMI BORDEAU MRN: [id number] Date of Birth: 1950-11-01 Referring Provider:     Pulmonary Rehab Walk Test from 11/28/2016 in [place]  Referring Provider  Dr. [name]      Encounter Date: 03/15/2017  Check In: Session Check In - 03/15/17 1048      Check-In   Location  [facility]    Staff Present  Trish Fountain, RN, BSN;Maat Kafer, MS, ACSM RCEP, Exercise Physiologist;[name], RN    Supervising physician immediately available to respond to emergencies  [facility] immediately available    Physician(s)  Dr. Tyrell Antonio    Medication changes reported      No    Fall or balance concerns reported     No    Tobacco Cessation  No Change    Warm-up and Cool-down  Performed as group-led instruction    Resistance Training Performed  Yes    VAD Patient?  No      Pain Assessment   Currently in Pain?  No/denies    Multiple Pain Sites  No       Capillary Blood Glucose: No results found for this or any previous visit (from the past 24 hour(s)).    Social History   Tobacco Use  Smoking Status Former Smoker  . Packs/day: 1.50  . Years: 46.00  . Pack years: 69.00  . Types: Cigarettes  . Start date: 01/31/1964  . Last attempt to quit: 01/30/2010  . Years since quitting: 7.1  Smokeless Tobacco Never Used    Goals Met:  Exercise tolerated well Queuing for purse lip breathing No report of cardiac concerns or symptoms  Goals Unmet:  Not Applicable  Comments: Service time is from 10:30a to 12:30p    Dr. [name] is Medical Director for Pulmonary Rehab at [facility].

## 2017-03-16 ENCOUNTER — Other Ambulatory Visit: Payer: Self-pay

## 2017-03-16 MED ORDER — ROFLUMILAST 500 MCG PO TABS: 500.0000 ug | ORAL_TABLET | Freq: Every day | ORAL | 3 refills | Status: DC

## 2017-03-29 NOTE — Progress Notes (Signed)
Discharge Progress Report  Patient Details  Name: Erin Morrison MRN: [id number] Date of Birth: 1950/04/12 Referring Provider:     Pulmonary Rehab Walk Test from 11/28/2016 in [place]  Referring Provider  Dr. [name]       Number of Visits: 18  Reason for Discharge:  Patient has met program and personal goals.  Smoking History:  Social History   Tobacco Use  Smoking Status Former Smoker  . Packs/day: 1.50  . Years: 46.00  . Pack years: 69.00  . Types: Cigarettes  . Start date: 01/31/1964  . Last attempt to quit: 01/30/2010  . Years since quitting: 7.1  Smokeless Tobacco Never Used    Diagnosis:  Chronic obstructive pulmonary disease, unspecified COPD type ([place])  ADL [facility]: Pulmonary Assessment Scores    Row Name 11/30/16 0908 12/05/16 1610 03/19/17 0722     ADL [facility]   ADL Phase  Entry  Entry  Exit   SOB Score total  -  60  -     CAT Score   CAT Score  -  29 Entry  -     mMRC Score   mMRC Score  3  -  0      Initial Exercise Prescription: Initial Exercise Prescription - 11/30/16 0900      Date of Initial Exercise RX and Referring Provider   Date  11/28/16    Referring Provider  Dr. [name]      Oxygen   Oxygen  Continuous    Liters  2      Recumbant Bike   Level  1    Watts  15    Minutes  17      NuStep   Level  1    Minutes  17    METs  1.4      Track   Laps  10    Minutes  17      Prescription Details   Frequency (times per week)  2    Duration  Progress to 45 minutes of aerobic exercise without signs/symptoms of physical distress      Intensity   THRR 40-80% of Max Heartrate  61-122    Ratings of Perceived Exertion  11-13    Perceived Dyspnea  0-4      Progression   Progression  Continue progressive overload as per policy without signs/symptoms or physical distress.      Resistance Training   Training Prescription  Yes    Weight  orange bands    Reps  10-15       Discharge Exercise  Prescription (Final Exercise Prescription Changes): Exercise Prescription Changes - 03/06/17 1500      Response to Exercise   Blood Pressure (Admit)  128/62    Blood Pressure (Exercise)  118/60    Blood Pressure (Exit)  110/72    Heart Rate (Admit)  84 bpm    Heart Rate (Exercise)  97 bpm    Heart Rate (Exit)  88 bpm    Oxygen Saturation (Admit)  96 %    Oxygen Saturation (Exercise)  96 %    Oxygen Saturation (Exit)  96 %    Rating of Perceived Exertion (Exercise)  13    Perceived Dyspnea (Exercise)  3    Duration  Progress to 45 minutes of aerobic exercise without signs/symptoms of physical distress    Intensity  THRR unchanged      Progression   Progression  Continue to progress  workloads to maintain intensity without signs/symptoms of physical distress.      Resistance Training   Training Prescription  Yes    Weight  orange bands    Reps  10-15    Time  10 Minutes      Oxygen   Oxygen  Continuous    Liters  2      Recumbant Bike   Level  2    Minutes  17      NuStep   Level  3    Minutes  17    METs  2.1      Track   Laps  7    Minutes  17       Functional Capacity: 6 Minute Walk    Row Name 11/30/16 0859 03/19/17 0719       6 Minute Walk   Phase  Initial  Discharge    Distance  1300 feet  1500 feet    Distance Feet Change  -  200 ft    Walk Time  6 minutes  6 minutes    # of Rest Breaks  0  0    MPH  2.46  2.84    METS  2.91  3.14    RPE  13  13    Perceived Dyspnea   1  3    Symptoms  Yes (comment)  No    Comments  wheelchair  used wheelchair    Resting HR  92 bpm  86 bpm    Resting BP  134/82  110/56    Resting Oxygen Saturation   93 %  96 %    Exercise Oxygen Saturation  during 6 min walk  85 %  94 %    Max Ex. HR  113 bpm  128 bpm    Max Ex. BP  150/72  174/80      Interval HR   1 Minute HR  94  120    2 Minute HR  93  122    3 Minute HR  100  122    4 Minute HR  112  123    5 Minute HR  113  126    6 Minute HR  112  128    2 Minute  Post HR  96  122    Interval Heart Rate?  Yes  Yes      Interval Oxygen   Interval Oxygen?  Yes  Yes    Baseline Oxygen Saturation %  93 %  96 %    1 Minute Oxygen Saturation %  92 %  95 %    1 Minute Liters of Oxygen  0 L  2 L    2 Minute Oxygen Saturation %  85 %  94 %    2 Minute Liters of Oxygen  0 L  2 L    3 Minute Oxygen Saturation %  93 %  94 %    3 Minute Liters of Oxygen  2 L  2 L    4 Minute Oxygen Saturation %  96 %  95 %    4 Minute Liters of Oxygen  2 L  2 L    5 Minute Oxygen Saturation %  93 %  94 %    5 Minute Liters of Oxygen  2 L  2 L    6 Minute Oxygen Saturation %  91 %  94 %    6 Minute Liters of Oxygen  2  L  2 L    2 Minute Post Oxygen Saturation %  96 %  95 %    2 Minute Post Liters of Oxygen  2 L  2 L       Psychological, QOL, Others - Outcomes: PHQ 2/9: Depression screen PHQ 2/9 11/27/2016  Decreased Interest 0  Down, Depressed, Hopeless 1  PHQ - 2 Score 1    Quality of Life:   Personal Goals: Goals established at orientation with interventions provided to work toward goal. Personal Goals and Risk Factors at Admission - 11/27/16 1035      Core Components/Risk Factors/Patient Goals on Admission    Weight Management  Yes    Intervention  Weight Management: Develop a combined nutrition and exercise program designed to reach desired caloric intake, while maintaining appropriate intake of nutrient and fiber, sodium and fats, and appropriate energy expenditure required for the weight goal.    Admit Weight  184 lb 15.5 oz (83.9 kg)    Goal Weight: Short Term  179 lb (81.2 kg)    Expected Outcomes  Short Term: Continue to assess and modify interventions until short term weight is achieved    Improve shortness of breath with ADL's  Yes    Intervention  Provide education, individualized exercise plan and daily activity instruction to help decrease symptoms of SOB with activities of daily living.    Expected Outcomes  Short Term: Achieves a reduction of  symptoms when performing activities of daily living.    Develop more efficient breathing techniques such as purse lipped breathing and diaphragmatic breathing; and practicing self-pacing with activity  Yes    Intervention  Provide education, demonstration and support about specific breathing techniuqes utilized for more efficient breathing. Include techniques such as pursed lipped breathing, diaphragmatic breathing and self-pacing activity.    Expected Outcomes  Short Term: Participant will be able to demonstrate and use breathing techniques as needed throughout daily activities.    Increase knowledge of respiratory medications and ability to use respiratory devices properly   Yes    Intervention  Provide education and demonstration as needed of appropriate use of medications, inhalers, and oxygen therapy.    Expected Outcomes  Short Term: Achieves understanding of medications use. Understands that oxygen is a medication prescribed by physician. Demonstrates appropriate use of inhaler and oxygen therapy.        Personal Goals Discharge: Goals and Risk Factor Review    Row Name 12/12/16 1000 01/04/17 0951 01/19/17 0943 02/19/17 1045 03/12/17 1435     Core Components/Risk Factors/Patient Goals Review   Personal Goals Review  Weight Management/Obesity;Improve shortness of breath with ADL's;Increase knowledge of respiratory medications and ability to use respiratory devices properly.;Develop more efficient breathing techniques such as purse lipped breathing and diaphragmatic breathing and practicing self-pacing with activity.  Weight Management/Obesity;Improve shortness of breath with ADL's;Increase knowledge of respiratory medications and ability to use respiratory devices properly.;Develop more efficient breathing techniques such as purse lipped breathing and diaphragmatic breathing and practicing self-pacing with activity.  Weight Management/Obesity;Improve shortness of breath with ADL's;Increase  knowledge of respiratory medications and ability to use respiratory devices properly.;Develop more efficient breathing techniques such as purse lipped breathing and diaphragmatic breathing and practicing self-pacing with activity.  Weight Management/Obesity;Improve shortness of breath with ADL's;Increase knowledge of respiratory medications and ability to use respiratory devices properly.;Develop more efficient breathing techniques such as purse lipped breathing and diaphragmatic breathing and practicing self-pacing with activity.  Weight Management/Obesity;Improve shortness of breath with ADL's;Increase knowledge of  respiratory medications and ability to use respiratory devices properly.;Develop more efficient breathing techniques such as purse lipped breathing and diaphragmatic breathing and practicing self-pacing with activity.   Review  Just started program, has attended 2 exercise sessions, too early to see progression toward goals  no weight loss, but strength and stamina have increased and I see major confidance building  no weight loss, but strength and stamina have increased and I see major confidance building  she was absent 3 sessions due to the flu, she is back and regaining her strength, progressing well  progressing well, walking 15 laps on the track, level 2 on recumbent bike, level 3 on nustep.   Expected Outcomes  In the next 30 days, should see progression toward goals  Continued improvement towards admission goals  Continued improvement towards admission goals  Continued improvement towards admission goals  Continued improvement towards admission goals      Exercise Goals and Review:   Nutrition & Weight - Outcomes: Pre Biometrics - 11/27/16 1034      Pre Biometrics   Height  _0  (1.575 m)    Weight  184 lb 15.5 oz (83.9 kg)    BMI (Calculated)  33.82    Grip Strength  31 kg      Post Biometrics - 03/19/17 0721       Post  Biometrics   Grip Strength  26 kg        Nutrition: Nutrition Therapy & Goals - 12/29/16 1347      Nutrition Therapy   Diet  Heart Healthy      Personal Nutrition Goals   Nutrition Goal  Identify food quantities necessary to achieve wt loss of  -2# per week to a goal wt loss of 6-24 lb at graduation from pulmonary rehab.    Personal Goal #2  Describe the benefit of including fruits, vegetables, whole grains, and low-fat dairy products in a healthy meal plan.      Intervention Plan   Intervention  Prescribe, educate and counsel regarding individualized specific dietary modifications aiming towards targeted core components such as weight, hypertension, lipid management, diabetes, heart failure and other comorbidities.    Expected Outcomes  Short Term Goal: Understand basic principles of dietary content, such as calories, fat, sodium, cholesterol and nutrients.;Long Term Goal: Adherence to prescribed nutrition plan.       Nutrition Discharge: Nutrition Assessments - 03/23/17 1037      Rate Your Plate Scores   Pre Score  33    Post Score  61       Education Questionnaire Score: Knowledge Questionnaire Score - 12/05/16 1610      Knowledge Questionnaire Score   Pre Score  11/13       Goals reviewed with patient; copy given to patient.

## 2017-04-02 ENCOUNTER — Encounter: Payer: Self-pay | Admitting: Adult Health

## 2017-04-02 ENCOUNTER — Ambulatory Visit: Payer: Managed Care, Other (non HMO) | Admitting: Adult Health

## 2017-04-02 DIAGNOSIS — J309 Allergic rhinitis, unspecified: Secondary | ICD-10-CM

## 2017-04-02 DIAGNOSIS — J449 Chronic obstructive pulmonary disease, unspecified: Secondary | ICD-10-CM

## 2017-04-02 NOTE — Progress Notes (Signed)
_0  ID: Erin Morrison, female    DOB: [birth year], 67 y.o.   MRN: [id number]  Chief Complaint  Patient presents with  . Follow-up    COPD    Referring provider: Fae Pippin  HPI: 67 year old female former smoker for moderate COPD  Significant tests/ events reviewed  PFT 07/13/15: FVC 2.09 L (73%) FEV1 1.45 L (66%) ratio 69 sig bronchodilator response DLCO uncorrected 62%  Alpha-1 antitrypsin: MM (169)  04/02/2017 Follow up: Moderate COPD  Patient presents for a 34-monthfollow-up.  Patient has underlying moderate COPD.  She remains on Pulmicort /Perforomist/ Lonhala  nebulizer twice daily . Says overall she is doing okay. Gets winded with activity . Gets anxious at times due to breathing . No flare of cough or wheezing .  She is on chronic steroids -prednisone 181mdaily .  Does have nasal stuffiness and congestion . Tried saline nasal rinses but not helping .     No Known Allergies  Immunization History  Administered Date(s) Administered  . Influenza Split 10/18/2015  . Influenza, High Dose Seasonal PF 10/24/2016  . Pneumococcal Conjugate-13 01/30/2013  . Pneumococcal Polysaccharide-23 02/27/2011    Past Medical History:  Diagnosis Date  . [place] D[place]  . Allergic rhinitis   . Atrophic vaginitis   . COPD [facility] obstructive [facility] disease) ([facility]  . Hypertension   . Low back Morrison   . Umbilical hernia     Tobacco History: Social History   Tobacco Use  Smoking Status Former Smoker  . Packs/day: 1.50  . Years: 46.00  . Pack years: 69.00  . Types: Cigarettes  . Start date: 01/31/1964  . Last attempt to quit: 01/30/2010  . Years since quitting: 7.1  Smokeless Tobacco Never Used   Counseling given: Not Answered   Outpatient Encounter Medications as of 04/02/2017  Medication Sig  . acetaminophen (TYLENOL) 500 MG tablet Take 500 mg by mouth 2 (two) times daily as needed for moderate Morrison or headache.  .  albuterol (PROVENTIL HFA;VENTOLIN HFA) 108 (90 Base) MCG/ACT inhaler Inhale 2 puffs into the lungs every 6 (six) hours as needed for wheezing or shortness of breath.   . Marland Kitchenlbuterol (PROVENTIL) (2.5 MG/3ML) 0.083% nebulizer solution Take 2.5 mg by nebulization every 6 (six) hours as needed for wheezing or shortness of breath.  . Marland Kitchenspirin 81 MG tablet Take 81 mg by mouth daily.  . Marland Kitchentorvastatin (LIPITOR) 40 MG tablet Take 1 tablet (40 mg total) by mouth daily at 6 PM.  . budesonide (PULMICORT) 0.5 MG/2ML nebulizer solution USE 1 VIAL VIA NEBULIZER 2 TIMES DAILY -DX CODE J44.9  . cetirizine (ZYRTEC) 10 MG tablet Take 10 mg by mouth daily.  . Marland Kitchensomeprazole (NEXIUM) 40 MG capsule Take 40 mg by mouth daily as needed (for acid reflex).   . formoterol (PERFOROMIST) 20 MCG/2ML nebulizer solution USE 1 VIAL VIA NEBULIZER TWICE A DAY  . Glycopyrrolate (LONHALA MAGNAIR STARTER KIT) 25 MCG/ML SOLN Inhale 25 mcg into the lungs 2 (two) times daily.  . isosorbide mononitrate (IMDUR) 30 MG 24 hr tablet TAKE 1/2 TABLET BY MOUTH DAILY  . montelukast (SINGULAIR) 10 MG tablet TAKE 1 TABLET BY MOUTH AT BEDTIME  . Polyethyl Glycol-Propyl Glycol (SYSTANE OP) Apply 1-2 drops to eye daily as needed (dry eyes).  . predniSONE (DELTASONE) 10 MG tablet Take 1 tablet (10 mg total) by mouth daily with breakfast.  . roflumilast (DALIRESP) 500 MCG TABS tablet Take 1 tablet (500 mcg total)  by mouth daily.  Marland Kitchen Spacer/Aero-Holding Chambers (AEROCHAMBER MV) inhaler Use as instructed  . nitroGLYCERIN (NITROSTAT) 0.4 MG SL tablet Place 1 tablet (0.4 mg total) under the tongue every 5 (five) minutes as needed for chest Morrison.   No facility-administered encounter medications on file as of 04/02/2017.      Review of Systems  Constitutional:   No  weight loss, night sweats,  Fevers, chills, fatigue, or  lassitude.  HEENT:   No headaches,  Difficulty swallowing,  Tooth/dental problems, or  Sore throat,                No sneezing, itching, ear  ache,  +nasal congestion, post nasal drip,   CV:  No chest Morrison,  Orthopnea, PND, swelling in lower extremities, anasarca, dizziness, palpitations, syncope.   GI  No heartburn, indigestion, abdominal Morrison, nausea, vomiting, diarrhea, change in bowel habits, loss of appetite, bloody stools.   Resp:    No chest wall deformity  Skin: no rash or lesions.  GU: no dysuria, change in color of urine, no urgency or frequency.  No flank Morrison, no hematuria   MS:  No joint Morrison or swelling.  No decreased range of motion.  No back Morrison.    Physical Exam  BP 124/74 (BP Location: Left Arm, Cuff Size: Normal)   Pulse 77   Ht _0  (1.575 m)   Wt 185 lb 3.2 oz (84 kg)   SpO2 95%   BMI 33.87 kg/m   GEN: A/Ox3; pleasant , NAD, frail and elderly    HEENT:  [place]/AT,  EACs-clear, TMs-wnl, NOSE-clear drainage , THROAT-clear, no lesions, no postnasal drip or exudate noted.   NECK:  Supple w/ fair ROM; no JVD; normal carotid impulses w/o bruits; no thyromegaly or nodules palpated; no lymphadenopathy.    RESP  Clear  P & A; w/o, wheezes/ rales/ or rhonchi. no accessory muscle use, no dullness to percussion  CARD:  RRR, no m/r/g, no peripheral edema, pulses intact, no cyanosis or clubbing.  GI:   Soft & nt; nml bowel sounds; no organomegaly or masses detected.   Musco: Warm bil, no deformities or joint swelling noted.   Neuro: alert, no focal deficits noted.    Skin: Warm, no lesions or rashes    Lab Results:  CBC    Component Value Date/Time   WBC 12.3 (H) 02/24/2016 1230   WBC 13.5 (H) 12/08/2015 0330   RBC 4.75 02/24/2016 1230   RBC 4.98 12/08/2015 0330   HGB 14.4 02/24/2016 1230   HCT 42.1 02/24/2016 1230   PLT 384 (H) 02/24/2016 1230   MCV 89 02/24/2016 1230   MCV 94 02/26/2011 0424   MCH 30.3 02/24/2016 1230   MCH 30.3 12/08/2015 0330   MCHC 34.2 02/24/2016 1230   MCHC 32.5 12/08/2015 0330   RDW 13.3 02/24/2016 1230   RDW 13.5 02/26/2011 0424   LYMPHSABS 1.6 02/24/2016  1230   LYMPHSABS 0.6 (L) 02/26/2011 0424   MONOABS 0.4 12/04/2015 0511   MONOABS 0.1 02/26/2011 0424   EOSABS 0.1 02/24/2016 1230   EOSABS 0.0 02/26/2011 0424   BASOSABS 0.0 02/24/2016 1230   BASOSABS 0.0 02/26/2011 0424    BMET    Component Value Date/Time   NA 142 02/24/2016 1230   NA 146 (H) 02/26/2011 0424   K 4.4 02/24/2016 1230   K 3.7 02/26/2011 0424   CL 101 02/24/2016 1230   CL 112 (H) 02/26/2011 0424   CO2 21 02/24/2016 1230  CO2 23 02/26/2011 0424   GLUCOSE 90 02/24/2016 1230   GLUCOSE 147 (H) 12/08/2015 0330   GLUCOSE 137 (H) 02/26/2011 0424   BUN 13 02/24/2016 1230   BUN 12 02/26/2011 0424   CREATININE 0.67 02/24/2016 1230   CREATININE 0.58 (L) 02/26/2011 0424   CALCIUM 10.3 02/24/2016 1230   CALCIUM 9.3 02/26/2011 0424   GFRNONAA 91 02/24/2016 1230   GFRNONAA >60 02/26/2011 0424   GFRAA 105 02/24/2016 1230   GFRAA >60 02/26/2011 0424    BNP No results found for: BNP  ProBNP No results found for: PROBNP  Imaging: No results found.   Assessment & Plan:   Moderate COPD (chronic obstructive pulmonary disease) ([facility]) Compensated on present regimen   Plan  Patient Instructions  Continue on Budesonide/Perforomist Neb Twice daily  .  Continue on [employer] .Twice daily   Use oxygen 2l/m at rest , 4l/m walking  Continue on  Daliresp daily .  Continue on Prednisone 101m daily  Saline nasal rinses As needed   Saline nasal gel At bedtime   Flonase 2 puffs daily in am .  Follow up with Dr. AElsworth Soho In 4 months and As needed   Please contact office for sooner follow up if symptoms do not improve or worsen or seek emergency care       Chronic allergic rhinitis Add flonase   Plan  Patient Instructions  Continue on Budesonide/Perforomist Neb Twice daily  .  Continue on [employer].Twice daily   Use oxygen 2l/m at rest , 4l/m walking  Continue on  Daliresp daily .  Continue on Prednisone 163mdaily  Saline nasal rinses As needed   Saline nasal  gel At bedtime   Flonase 2 puffs daily in am .  Follow up with Dr. AlElsworth SohoIn 4 months and As needed   Please contact office for sooner follow up if symptoms do not improve or worsen or seek emergency care          TaRexene EdisonNP 04/02/2017

## 2017-04-02 NOTE — Assessment & Plan Note (Signed)
Add flonase   Plan  Patient Instructions  Continue on Budesonide/Perforomist Neb Twice daily  .  Continue on [employer]onhal[employer] Neb .Twice daily   Use oxygen 2l/m at rest , 4l/m walking  Continue on  Daliresp daily .  Continue on Prednisone 10mg  daily  Saline nasal rinses As needed   Saline nasal gel At bedtime   Flonase 2 puffs daily in am .  Follow up with Dr. Vassie LollAlva  In 4 months and As needed   Please contact office for sooner follow up if symptoms do not improve or worsen or seek emergency care

## 2017-04-02 NOTE — Patient Instructions (Addendum)
Continue on Budesonide/Perforomist Neb Twice daily  .  Continue on [employer]on[employer] Neb .Twice daily   Use oxygen 2l/m at rest , 4l/m walking  Continue on  Daliresp daily .  Continue on Prednisone 10mg  daily  Saline nasal rinses As needed   Saline nasal gel At bedtime   Flonase 2 puffs daily in am .  Follow up with Dr. Vassie LollAlva  In 4 months and As needed   Please contact office for sooner follow up if symptoms do not improve or worsen or seek emergency care

## 2017-04-02 NOTE — Assessment & Plan Note (Signed)
Compensated on present regimen   Plan  Patient Instructions  Continue on Budesonide/Perforomist Neb Twice daily  .  Continue on [employer] Neb .Twice daily   Use oxygen 2l/m at rest , 4l/m walking  Continue on  Daliresp daily .  Continue on Prednisone 10mg  daily  Saline nasal rinses As needed   Saline nasal gel At bedtime   Flonase 2 puffs daily in am .  Follow up with Dr. Vassie LollAlva  In 4 months and As needed   Please contact office for sooner follow up if symptoms do not improve or worsen or seek emergency care

## 2017-04-16 ENCOUNTER — Other Ambulatory Visit: Payer: Self-pay | Admitting: Pulmonary Disease

## 2017-04-17 DIAGNOSIS — I2510 Atherosclerotic heart disease of native coronary artery without angina pectoris: Secondary | ICD-10-CM | POA: Insufficient documentation

## 2017-04-17 HISTORY — DX: Atherosclerotic heart disease of native coronary artery without angina pectoris: I25.10

## 2017-04-17 NOTE — Progress Notes (Signed)
Cardiology Office Note    Date:  04/18/2017   ID:  Erin Morrison, DOB 22-Jan-1951, MRN [id number]  PCP:  Erin Bender, PA-C  Cardiologist: Erin Grooms, MD   Chief Complaint  Patient presents with  . Coronary Artery Disease  . Shortness of Breath    History of Present Illness:  Erin Morrison is a 67 y.o. female for follow-up of a recent clinical history of respiratory failure, pulmonary infection, pulmonary congestion related to diastolic heart failure, new regional wall motion abnormality on echocardiography, and elevated cardiac markers during the acute illness. She was felt to have underlying coronary disease and subsequently underwent coronary angiography that revealed moderate coronary obstruction but no high-grade disease.   After the episode of enzyme elevation during acute respiratory failure in 2018, angiography demonstrated less than severe coronary disease.  She complains of dull aching in the left chest that is nearly continuous.  It is not aggravated by physical activity.  States that Dr. Ashok Morrison identified worsening in pulmonary condition.  She is not use nitroglycerin for the chest aching.  She denies orthopnea.  No significant lower extremity swelling.  No recent laboratory data to assess electrolyte and kidney function.   Past Medical History:  Diagnosis Date  . Acute DVT (deep venous thrombosis) (Erin Morrison)   . Allergic rhinitis   . Atrophic vaginitis   . COPD (chronic obstructive pulmonary disease) ([place])   . Hypertension   . Low back pain   . Umbilical hernia     Past Surgical History:  Procedure Laterality Date  . arthroscopic knee surgery Right   . CARDIAC CATHETERIZATION N/A 03/03/2016   Procedure: Right/Left Heart Cath and Coronary Angiography;  Surgeon: Belva Crome, MD;  Location: [facility];  Service: Cardiovascular;  Laterality: N/A;  . TUBAL LIGATION      Current Medications: Outpatient Medications Prior to Visit  Medication Sig  Dispense Refill  . acetaminophen (TYLENOL) 500 MG tablet Take 500 mg by mouth 2 (two) times daily as needed for moderate pain or headache.    . albuterol (PROVENTIL HFA;VENTOLIN HFA) 108 (90 Base) MCG/ACT inhaler Inhale 2 puffs into the lungs every 6 (six) hours as needed for wheezing or shortness of breath.     Marland Kitchen albuterol (PROVENTIL) (2.5 MG/3ML) 0.083% nebulizer solution Take 2.5 mg by nebulization every 6 (six) hours as needed for wheezing or shortness of breath.    Marland Kitchen aspirin 81 MG tablet Take 81 mg by mouth daily.    Marland Kitchen atorvastatin (LIPITOR) 40 MG tablet Take 1 tablet (40 mg total) by mouth daily at 6 PM. 90 tablet 2  . budesonide (PULMICORT) 0.5 MG/2ML nebulizer solution USE 1 VIAL VIA NEBULIZER 2 TIMES DAILY -DX CODE J44.9 120 mL 3  . cetirizine (ZYRTEC) 10 MG tablet Take 10 mg by mouth daily.    Marland Kitchen esomeprazole (NEXIUM) 40 MG capsule Take 40 mg by mouth daily as needed (for acid reflex).     . formoterol (PERFOROMIST) 20 MCG/2ML nebulizer solution USE 1 VIAL VIA NEBULIZER TWICE A DAY 120 mL 3  . Glycopyrrolate (LONHALA MAGNAIR STARTER KIT) 25 MCG/ML SOLN Inhale 25 mcg into the lungs 2 (two) times daily. 1 mL 6  . montelukast (SINGULAIR) 10 MG tablet TAKE 1 TABLET BY MOUTH AT BEDTIME 30 tablet 3  . Polyethyl Glycol-Propyl Glycol (SYSTANE OP) Apply 1-2 drops to eye daily as needed (dry eyes).    . predniSONE (DELTASONE) 10 MG tablet TAKE 1 TABLET (10 MG  TOTAL) BY MOUTH DAILY WITH BREAKFAST. 30 tablet 1  . roflumilast (DALIRESP) 500 MCG TABS tablet Take 1 tablet (500 mcg total) by mouth daily. 30 tablet 3  . Spacer/Aero-Holding Chambers (AEROCHAMBER MV) inhaler Use as instructed 1 each 0  . isosorbide mononitrate (IMDUR) 30 MG 24 hr tablet TAKE 1/2 TABLET BY MOUTH DAILY 45 tablet 0  . nitroGLYCERIN (NITROSTAT) 0.4 MG SL tablet Place 1 tablet (0.4 mg total) under the tongue every 5 (five) minutes as needed for chest pain. 90 tablet 3   No facility-administered medications prior to visit.       Allergies:   Patient has no known allergies.   Social History   Socioeconomic History  . Marital status: Married    Spouse name: None  . Number of children: None  . Years of education: None  . Highest education level: None  Social Needs  . Financial resource strain: None  . Food insecurity - worry: None  . Food insecurity - inability: None  . Transportation needs - medical: None  . Transportation needs - non-medical: None  Occupational History  . Occupation: Retired  Tobacco Use  . Smoking status: Former Smoker    Packs/day: 1.50    Years: 46.00    Pack years: 69.00    Types: Cigarettes    Start date: 01/31/1964    Last attempt to quit: 01/30/2010    Years since quitting: 7.2  . Smokeless tobacco: Never Used  Substance and Sexual Activity  . Alcohol use: No    Alcohol/week: 0.0 oz  . Drug use: No  . Sexual activity: No  Other Topics Concern  . None  Social History Narrative   Financial controller Pulmonary:   Originally from [place]. Moved to [place] in 1986. Has traveled to [facility]. 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 Has an outside dog. No bird or mold exposure. She does have a musty smell in her bedroom.      Family History:  The patient's family history includes Cirrhosis in her father; Emphysema in her mother.   ROS:   Please see the history of present illness.    Anxiety concerning her overall health.  Depression related to inability the perform activities. All other systems reviewed and are negative.   PHYSICAL EXAM:   VS:  BP 120/70   Pulse 79   Ht 5' 2"  (1.575 m)   Wt 188 lb (85.3 kg)   BMI 34.39 kg/m    GEN: Well nourished, well developed, in no acute distress  HEENT: normal  Neck: no JVD, carotid bruits, or masses Cardiac: Increased AP diameter RRR; no murmurs, rubs, or gallops,no edema  Respiratory: Increased AP diameter with clear to auscultation bilaterally, normal work of breathing. GI: soft, nontender, nondistended,  + BS MS: no deformity or atrophy  Skin: warm and dry, no rash Neuro:  Alert and Oriented x 3, Strength and sensation are intact Psych: euthymic mood, full affect  Wt Readings from Last 3 Encounters:  04/18/17 188 lb (85.3 kg)  04/02/17 185 lb 3.2 oz (84 kg)  03/06/17 184 lb 4.9 oz (83.6 kg)      Studies/Labs Reviewed:   EKG:  EKG  Not repeated.  Recent Labs: 11/27/2016: ALT 22   Lipid Panel    Component Value Date/Time   CHOL 124 11/27/2016 1207   TRIG 83 11/27/2016 1207   HDL 55 11/27/2016 1207   CHOLHDL 2.3 11/27/2016 1207   CHOLHDL 3.6 12/04/2015  0511   VLDL 16 12/04/2015 0511   LDLCALC 52 11/27/2016 1207    Additional studies/ records that were reviewed today include:  No new cardiac data is available.    ASSESSMENT:    1. NSTEMI (non-ST elevated myocardial infarction) ([place])   2. CAD in native artery   3. Other hyperlipidemia   4. Moderate COPD (chronic obstructive pulmonary disease) ([place])   5. SOB (shortness of breath)      PLAN:  In order of problems listed above:  1. Coronary angiography demonstrated less than critical disease.  Risk factor modification with lipid lowering to LDL less than 70, blood pressure control, and A1c less than 7. 2. Currently complains of dull aching in the left chest that is present continuously.  It is not aggravated by physical activity.  I do not believe this is ischemia related.  We are discontinuing the very low-dose Imdur that she takes. 3. The most recent LDL in October was 52.  This management will be taken over by Dr. Tobie Lords. 4. COPD is severe and chronic O2 requiring.  This is her major morbidity and survival threat going forward. 5. BNP will be checked to exclude the possibility of volume expansion attributing to dyspnea.  We will plan to see the patient again as needed.  She should call if chest pain worsens or if there are any concerns about her heart.  Medication Adjustments/Labs and Tests Ordered: Current  medicines are reviewed at length with the patient today.  Concerns regarding medicines are outlined above.  Medication changes, Labs and Tests ordered today are listed in the Patient Instructions below. Patient Instructions  Medication Instructions:  1) DISCONTINUE Imdur  Labwork: CBC, CMET and Pro BNP today  Testing/Procedures: None  Follow-Up: Your physician recommends that you schedule a follow-up appointment as needed with Dr. Tamala Julian.    Any Other Special Instructions Will Be Listed Below (If Applicable).     If you need a refill on your cardiac medications before your next appointment, please call your pharmacy.      Signed, Erin Grooms, MD  04/18/2017 10:45 AM    [facility] [address], [place], [place]  [address] Phone: [phone]; Fax: [phone]

## 2017-04-18 ENCOUNTER — Other Ambulatory Visit: Payer: Self-pay

## 2017-04-18 ENCOUNTER — Encounter: Payer: Self-pay | Admitting: Interventional Cardiology

## 2017-04-18 ENCOUNTER — Ambulatory Visit: Payer: Managed Care, Other (non HMO) | Admitting: Interventional Cardiology

## 2017-04-18 VITALS — BP 120/70 | HR 79 | Ht 62.0 in | Wt 188.0 lb

## 2017-04-18 DIAGNOSIS — J449 Chronic obstructive pulmonary disease, unspecified: Secondary | ICD-10-CM | POA: Diagnosis not present

## 2017-04-18 DIAGNOSIS — E7849 Other hyperlipidemia: Secondary | ICD-10-CM | POA: Diagnosis not present

## 2017-04-18 DIAGNOSIS — I214 Non-ST elevation (NSTEMI) myocardial infarction: Secondary | ICD-10-CM

## 2017-04-18 DIAGNOSIS — R0602 Shortness of breath: Secondary | ICD-10-CM

## 2017-04-18 DIAGNOSIS — I2510 Atherosclerotic heart disease of native coronary artery without angina pectoris: Secondary | ICD-10-CM

## 2017-04-18 LAB — CBC
Hematocrit: 42 % (ref 34.0–46.6)
Hemoglobin: 14 g/dL (ref 11.1–15.9)
MCH: 29.9 pg (ref 26.6–33.0)
MCHC: 33.3 g/dL (ref 31.5–35.7)
MCV: 90 fL (ref 79–97)
Platelets: 319 10*3/uL (ref 150–379)
RBC: 4.69 x10E6/uL (ref 3.77–5.28)
RDW: 13.1 % (ref 12.3–15.4)
WBC: 11.6 10*3/uL — AB (ref 3.4–10.8)

## 2017-04-18 LAB — COMPREHENSIVE METABOLIC PANEL
ALBUMIN: 4.5 g/dL (ref 3.6–4.8)
ALT: 21 IU/L (ref 0–32)
AST: 18 IU/L (ref 0–40)
Albumin/Globulin Ratio: 2 (ref 1.2–2.2)
Alkaline Phosphatase: 87 IU/L (ref 39–117)
BUN / CREAT RATIO: 19 (ref 12–28)
BUN: 13 mg/dL (ref 8–27)
Bilirubin Total: 0.9 mg/dL (ref 0.0–1.2)
CALCIUM: 9.6 mg/dL (ref 8.7–10.3)
CO2: 21 mmol/L (ref 20–29)
CREATININE: 0.69 mg/dL (ref 0.57–1.00)
Chloride: 104 mmol/L (ref 96–106)
GFR calc non Af Amer: 90 mL/min/{1.73_m2} (ref >59)
GFR, EST AFRICAN AMERICAN: 103 mL/min/{1.73_m2} (ref >59)
GLUCOSE: 123 mg/dL — AB (ref 65–99)
Globulin, Total: 2.2 g/dL (ref 1.5–4.5)
Potassium: 4.5 mmol/L (ref 3.5–5.2)
Sodium: 141 mmol/L (ref 134–144)
TOTAL PROTEIN: 6.7 g/dL (ref 6.0–8.5)

## 2017-04-18 LAB — PRO B NATRIURETIC PEPTIDE: NT-Pro BNP: 45 pg/mL (ref 0–301)

## 2017-04-18 MED ORDER — MONTELUKAST SODIUM 10 MG PO TABS: 10.0000 mg | ORAL_TABLET | Freq: Every day | ORAL | 5 refills | Status: DC

## 2017-04-18 NOTE — Patient Instructions (Signed)
Medication Instructions:  1) DISCONTINUE Imdur  Labwork: CBC, CMET and Pro BNP today  Testing/Procedures: None  Follow-Up: Your physician recommends that you schedule a follow-up appointment as needed with Dr. Katrinka BlazingSmith.    Any Other Special Instructions Will Be Listed Below (If Applicable).     If you need a refill on your cardiac medications before your next appointment, please call your pharmacy.

## 2017-05-03 ENCOUNTER — Encounter: Payer: Self-pay | Admitting: Adult Health

## 2017-05-03 ENCOUNTER — Ambulatory Visit: Payer: Managed Care, Other (non HMO) | Admitting: Adult Health

## 2017-05-03 DIAGNOSIS — J9611 Chronic respiratory failure with hypoxia: Secondary | ICD-10-CM | POA: Diagnosis not present

## 2017-05-03 DIAGNOSIS — J449 Chronic obstructive pulmonary disease, unspecified: Secondary | ICD-10-CM | POA: Diagnosis not present

## 2017-05-03 HISTORY — DX: Chronic respiratory failure with hypoxia: J96.11

## 2017-05-03 MED ORDER — PREDNISONE 10 MG PO TABS: ORAL_TABLET | ORAL | 0 refills | Status: DC

## 2017-05-03 NOTE — Patient Instructions (Signed)
Prednisone taper as directed.  Continue on Budesonide/Perforomist Neb Twice daily  .  Continue on [employer] Neb .Twice daily   Use oxygen 2l/m at rest , 4l/m walking  Continue on  Daliresp daily .  Follow up with Dr. Vassie LollAlva  In 4 months and As needed   Please contact office for sooner follow up if symptoms do not improve or worsen or seek emergency care

## 2017-05-03 NOTE — Assessment & Plan Note (Signed)
Cont on O2 .  

## 2017-05-03 NOTE — Assessment & Plan Note (Signed)
Mild exacerbation despite maximum therapy and chronic steroid use   Plan  Patient Instructions  Prednisone taper as directed.  Continue on Budesonide/Perforomist Neb Twice daily  .  Continue on [employer] Neb .Twice daily   Use oxygen 2l/m at rest , 4l/m walking  Continue on  Daliresp daily .  Follow up with Dr. Vassie LollAlva  In 4 months and As needed   Please contact office for sooner follow up if symptoms do not improve or worsen or seek emergency care

## 2017-05-03 NOTE — Progress Notes (Signed)
 @Patient ID: Erin Morrison, female    DOB: 06/03/1950, 66 y.o.   MRN: [id number]  Chief Complaint  Patient presents with  . Follow-up    COPD     Referring provider: Conroy, Nathan, PA-C  HPI: 66-year-old female former smoker for moderate COPD  Significant tests/ events reviewed  PFT 07/13/15: FVC 2.09 L (73%) FEV1 1.45 L (66%)ratio69sigbronchodilator response DLCO uncorrected 62%  Alpha-1 antitrypsin: MM (169)  05/03/2017 Follow up : COPD  Presents for a one-month follow-up.  Patient has underlying moderate COPD. Says over the last couple weeks that she has had increased dry cough and wheezing.  Feels that breathing has not been as good.  She denies any discolored mucus, fever, chest pain, orthopnea or edema. She remains on triple combo nebulizers along with prednisone 10 mg daily and Daliresp daily.  Recent CXR in Dec w/ chronic changes.  Appetite is good .    No Known Allergies  Immunization History  Administered Date(s) Administered  . Influenza Split 10/18/2015  . Influenza, High Dose Seasonal PF 10/24/2016  . Pneumococcal Conjugate-13 01/30/2013  . Pneumococcal Polysaccharide-23 02/27/2011    Past Medical History:  Diagnosis Date  . Acute DVT (deep venous thrombosis) ([facility])   . Allergic rhinitis   . Atrophic vaginitis   . COPD (chronic obstructive pulmonary disease) ([facility])   . Hypertension   . Low back pain   . Umbilical hernia     Tobacco History: Social History   Tobacco Use  Smoking Status Former Smoker  . Packs/day: 1.50  . Years: 46.00  . Pack years: 69.00  . Types: Cigarettes  . Start date: 01/31/1964  . Last attempt to quit: 01/30/2010  . Years since quitting: 7.2  Smokeless Tobacco Never Used   Counseling given: Not Answered   Outpatient Encounter Medications as of 05/03/2017  Medication Sig  . acetaminophen (TYLENOL) 500 MG tablet Take 500 mg by mouth 2 (two) times daily as needed for moderate pain or headache.  . albuterol  (PROVENTIL HFA;VENTOLIN HFA) 108 (90 Base) MCG/ACT inhaler Inhale 2 puffs into the lungs every 6 (six) hours as needed for wheezing or shortness of breath.   . albuterol (PROVENTIL) (2.5 MG/3ML) 0.083% nebulizer solution Take 2.5 mg by nebulization every 6 (six) hours as needed for wheezing or shortness of breath.  . aspirin 81 MG tablet Take 81 mg by mouth daily.  . atorvastatin (LIPITOR) 40 MG tablet Take 1 tablet (40 mg total) by mouth daily at 6 PM.  . budesonide (PULMICORT) 0.5 MG/2ML nebulizer solution USE 1 VIAL VIA NEBULIZER 2 TIMES DAILY -DX CODE J44.9  . cetirizine (ZYRTEC) 10 MG tablet Take 10 mg by mouth daily.  . esomeprazole (NEXIUM) 40 MG capsule Take 40 mg by mouth daily as needed (for acid reflex).   . formoterol (PERFOROMIST) 20 MCG/2ML nebulizer solution USE 1 VIAL VIA NEBULIZER TWICE A DAY  . Glycopyrrolate (LONHALA MAGNAIR STARTER KIT) 25 MCG/ML SOLN Inhale 25 mcg into the lungs 2 (two) times daily.  . montelukast (SINGULAIR) 10 MG tablet TAKE 1 TABLET BY MOUTH AT BEDTIME  . montelukast (SINGULAIR) 10 MG tablet Take 1 tablet (10 mg total) by mouth at bedtime.  . Polyethyl Glycol-Propyl Glycol (SYSTANE OP) Apply 1-2 drops to eye daily as needed (dry eyes).  . predniSONE (DELTASONE) 10 MG tablet TAKE 1 TABLET (10 MG TOTAL) BY MOUTH DAILY WITH BREAKFAST.  . roflumilast (DALIRESP) 500 MCG TABS tablet Take 1 tablet (500 mcg total) by mouth daily.  .   Spacer/Aero-Holding Chambers (AEROCHAMBER MV) inhaler Use as instructed  . nitroGLYCERIN (NITROSTAT) 0.4 MG SL tablet Place 1 tablet (0.4 mg total) under the tongue every 5 (five) minutes as needed for chest pain.   No facility-administered encounter medications on file as of 05/03/2017.      Review of Systems  Constitutional:   No  weight loss, night sweats,  Fevers, chills, fatigue, or  lassitude.  HEENT:   No headaches,  Difficulty swallowing,  Tooth/dental problems, or  Sore throat,                No sneezing, itching, ear  ache, nasal congestion, post nasal drip,   CV:  No chest pain,  Orthopnea, PND, swelling in lower extremities, anasarca, dizziness, palpitations, syncope.   GI  No heartburn, indigestion, abdominal pain, nausea, vomiting, diarrhea, change in bowel habits, loss of appetite, bloody stools.   Resp: No shortness of breath with exertion or at rest.  No excess mucus, no productive cough,  No non-productive cough,  No coughing up of blood.  No change in color of mucus.  No wheezing.  No chest wall deformity  Skin: no rash or lesions.  GU: no dysuria, change in color of urine, no urgency or frequency.  No flank pain, no hematuria   MS:  No joint pain or swelling.  No decreased range of motion.  No back pain.    Physical Exam  BP 128/64 (BP Location: Left Arm, Cuff Size: Normal)   Pulse 86   Ht 5' 2" (1.575 m)   Wt 184 lb 12.8 oz (83.8 kg)   SpO2 95%   BMI 33.80 kg/m   GEN: A/Ox3; pleasant , NAD, elderly on o2    HEENT:  [place]/AT,  EACs-clear, TMs-wnl, NOSE-clear, THROAT-clear, no lesions, no postnasal drip or exudate noted.   NECK:  Supple w/ fair ROM; no JVD; normal carotid impulses w/o bruits; no thyromegaly or nodules palpated; no lymphadenopathy.    RESP  Decreased BS in bases ,  no accessory muscle use, no dullness to percussion  CARD:  RRR, no m/r/g, no peripheral edema, pulses intact, no cyanosis or clubbing.  GI:   Soft & nt; nml bowel sounds; no organomegaly or masses detected.   Musco: Warm bil, no deformities or joint swelling noted.   Neuro: alert, no focal deficits noted.    Skin: Warm, no lesions or rashes    Lab Results:  CBC  BMET  No results found for: BNP  ProBNP    Component Value Date/Time   PROBNP 45 04/18/2017 1048    Imaging: No results found.   Assessment & Plan:   Moderate COPD (chronic obstructive pulmonary disease) ([facility]) Mild exacerbation despite maximum therapy and chronic steroid use   Plan  Patient Instructions  Prednisone  taper as directed.  Continue on Budesonide/Perforomist Neb Twice daily  .  Continue on Lonhala Neb .Twice daily   Use oxygen 2l/m at rest , 4l/m walking  Continue on  Daliresp daily .  Follow up with Dr. Alva  In 4 months and As needed   Please contact office for sooner follow up if symptoms do not improve or worsen or seek emergency care       Chronic respiratory failure with hypoxia ([facility]) Cont on O2      Tammy Parrett, NP 05/03/2017  

## 2017-05-03 NOTE — Addendum Note (Signed)
Addended by: Boone MasterJONES, Toshika Parrow E on: 05/03/2017 04:42 PM   Modules accepted: Orders

## 2017-05-23 ENCOUNTER — Other Ambulatory Visit: Payer: Self-pay | Admitting: Adult Health

## 2017-05-23 ENCOUNTER — Other Ambulatory Visit: Payer: Self-pay | Admitting: Pulmonary Disease

## 2017-05-23 DIAGNOSIS — J449 Chronic obstructive pulmonary disease, unspecified: Secondary | ICD-10-CM

## 2017-07-23 ENCOUNTER — Other Ambulatory Visit: Payer: Self-pay | Admitting: Adult Health

## 2017-07-23 ENCOUNTER — Other Ambulatory Visit: Payer: Self-pay | Admitting: Pulmonary Disease

## 2017-08-17 ENCOUNTER — Other Ambulatory Visit: Payer: Self-pay | Admitting: Adult Health

## 2017-08-17 MED ORDER — GLYCOPYRROLATE 25 MCG/ML IN SOLN: 25.0000 ug | Freq: Two times a day (BID) | RESPIRATORY_TRACT | 5 refills | Status: DC

## 2017-08-17 NOTE — Telephone Encounter (Signed)
Received faxed refill request from [facility] in [place] for [place] office visit 4.4.19 with TP Last office visit with RA 12.3.18 Rx changed to the Refill Kit  Refills sent under RA's name Nothing further needed; will sign off

## 2017-09-05 ENCOUNTER — Ambulatory Visit: Payer: Managed Care, Other (non HMO) | Admitting: Adult Health

## 2017-09-12 ENCOUNTER — Ambulatory Visit: Payer: Managed Care, Other (non HMO) | Admitting: Adult Health

## 2017-09-19 ENCOUNTER — Ambulatory Visit (INDEPENDENT_AMBULATORY_CARE_PROVIDER_SITE_OTHER): Payer: Managed Care, Other (non HMO) | Admitting: Primary Care

## 2017-09-19 ENCOUNTER — Other Ambulatory Visit: Payer: Self-pay | Admitting: Pulmonary Disease

## 2017-09-19 ENCOUNTER — Encounter: Payer: Self-pay | Admitting: Primary Care

## 2017-09-19 DIAGNOSIS — J449 Chronic obstructive pulmonary disease, unspecified: Secondary | ICD-10-CM | POA: Diagnosis not present

## 2017-09-19 DIAGNOSIS — J9611 Chronic respiratory failure with hypoxia: Secondary | ICD-10-CM

## 2017-09-19 DIAGNOSIS — J309 Allergic rhinitis, unspecified: Secondary | ICD-10-CM

## 2017-09-19 DIAGNOSIS — Z23 Encounter for immunization: Secondary | ICD-10-CM

## 2017-09-19 MED ORDER — TRAMADOL HCL 50 MG PO TABS: ORAL_TABLET | ORAL | 0 refills | Status: DC

## 2017-09-19 MED ORDER — FORMOTEROL FUMARATE 20 MCG/2ML IN NEBU: INHALATION_SOLUTION | RESPIRATORY_TRACT | 3 refills | Status: DC

## 2017-09-19 MED ORDER — AZELASTINE-FLUTICASONE 137-50 MCG/ACT NA SUSP: 1.0000 | Freq: Two times a day (BID) | NASAL | 1 refills | Status: DC

## 2017-09-19 MED ORDER — BUDESONIDE 0.5 MG/2ML IN SUSP: RESPIRATORY_TRACT | 3 refills | Status: DC

## 2017-09-19 MED ORDER — PREDNISONE 10 MG PO TABS: 10.0000 mg | ORAL_TABLET | Freq: Every day | ORAL | 1 refills | Status: DC

## 2017-09-19 NOTE — Progress Notes (Addendum)
@Patient  ID: Erin Morrison, female    DOB: 17-Jun-1950, 67 y.o.   MRN: [id number]  Chief Complaint  Patient presents with  . Follow-up    COPD; some sob this summer    Referring provider: Fae Pippin  HPI: 67 year old female, former smoker 1.5-2ppd x 30+ years (quit 2012). PMH moderate COPD, chronic allergic rhinitis, elevated diaphragm, chronic resp failure, GERD, HTN, CAD, NSTEMI. Patient of Dr. Elsworth Soho, previously followed by Dr. [name]. Most recently seen by Rexene Edison in April for mild COPD exacerbation treated with prednisone taper. She is on triple combo nebulizer therapy, prednisone 31m daily and daliresp.   09/19/2017  Patient presents today for 4 month fu visit with her daughter. She feels alright today. Has been experiencing increased sob all summer. Continues with home oxygen, prednisone 144mdaily, daliresp and pulmicort/perforomist twice a day as prescribed. Using albuterol nebulizer 3 times a day with reported benefit. She has participated in pulmonary rehab with some improvement in symptoms but does not want to go back (mostly because she does not like leaving her house). There is a large underlying anxiety/depression component. Complains of nasal congestion. She has been using Afrin daily. Coughs once or twice in the morning, gets up clear phlegm. Occ tightness in chest, use albuterol with relief.    PFT 03/23/16: FVC 2.28 L (80%),FEV1 1.49 L (69%), ratio 66 sig bronchodilator response NO DLCO 07/13/15: FVC 2.34 L (82%) FEV1 1.46 L (67%)ratio63sigbronchodilator response DLCO uncorrected 62%  Alpha-1 antitrypsin: MM (169)   No Known Allergies  Immunization History  Administered Date(s) Administered  . Influenza Split 10/18/2015  . Influenza, High Dose Seasonal PF 10/24/2016  . Influenza,inj,Quad PF,6+ Mos 09/19/2017  . Pneumococcal Conjugate-13 01/30/2013  . Pneumococcal Polysaccharide-23 02/27/2011    Past Medical History:  Diagnosis Date  .  [place]  . Allergic rhinitis   . Atrophic vaginitis   . COPD [place]ch[place] disease) [place]  . Hypertension   . Low back Morrison   . Umbilical hernia     Tobacco History: Social History   Tobacco Use  Smoking Status Former Smoker  . Packs/day: 1.50  . Years: 46.00  . Pack years: 69.00  . Types: Cigarettes  . Start date: 01/31/1964  . Last attempt to quit: 01/30/2010  . Years since quitting: 7.6  Smokeless Tobacco Never Used   Counseling given: Not Answered   Outpatient Medications Prior to Visit  Medication Sig Dispense Refill  . acetaminophen (TYLENOL) 500 MG tablet Take 500 mg by mouth 2 (two) times daily as needed for moderate Morrison or headache.    . albuterol (PROVENTIL HFA;VENTOLIN HFA) 108 (90 Base) MCG/ACT inhaler Inhale 2 puffs into the lungs every 6 (six) hours as needed for wheezing or shortness of breath.     . Marland Kitchenlbuterol (PROVENTIL) (2.5 MG/3ML) 0.083% nebulizer solution Take 2.5 mg by nebulization every 6 (six) hours as needed for wheezing or shortness of breath.    . Marland Kitchenspirin 81 MG tablet Take 81 mg by mouth daily.    . Marland Kitchentorvastatin (LIPITOR) 40 MG tablet Take 1 tablet (40 mg total) by mouth daily at 6 PM. 90 tablet 2  . cetirizine (ZYRTEC) 10 MG tablet Take 10 mg by mouth daily.    . Marland KitchenALIRESP 500 MCG TABS tablet TAKE 1 TABLET (500 MCG TOTAL) BY MOUTH DAILY. 30 tablet 3  . Glycopyrrolate (LONHALA MAGNAIR REFILL KIT) 25 MCG/ML SOLN Inhale 25 mcg into the lungs  2 (two) times daily. 60 mL 5  . montelukast (SINGULAIR) 10 MG tablet TAKE 1 TABLET BY MOUTH AT BEDTIME 30 tablet 3  . montelukast (SINGULAIR) 10 MG tablet Take 1 tablet (10 mg total) by mouth at bedtime. 30 tablet 5  . budesonide (PULMICORT) 0.5 MG/2ML nebulizer solution USE 1 VIAL VIA NEBULIZER 2 TIMES DAILY -DX CODE J44.9 120 mL 3  . formoterol (PERFOROMIST) 20 MCG/2ML nebulizer solution USE 1 VIAL VIA NEBULIZER TWICE A DAY 120 mL 3  . predniSONE (DELTASONE) 10 MG  tablet TAKE 1 TABLET (10 MG TOTAL) BY MOUTH DAILY WITH BREAKFAST. 30 tablet 1  . esomeprazole (NEXIUM) 40 MG capsule Take 40 mg by mouth daily as needed (for acid reflex).     . nitroGLYCERIN (NITROSTAT) 0.4 MG SL tablet Place 1 tablet (0.4 mg total) under the tongue every 5 (five) minutes as needed for chest Morrison. 90 tablet 3  . Polyethyl Glycol-Propyl Glycol (SYSTANE OP) Apply 1-2 drops to eye daily as needed (dry eyes).    Marland Kitchen Spacer/Aero-Holding Chambers (AEROCHAMBER MV) inhaler Use as instructed (Patient not taking: Reported on 09/19/2017) 1 each 0  . predniSONE (DELTASONE) 10 MG tablet Take 4 tabs for 2 days, then 3 tabs for 2 days, 2 tabs for 2 days, then 1 tab for 2 days, then stop. (Patient not taking: Reported on 09/19/2017) 20 tablet 0   No facility-administered medications prior to visit.     Review of Systems  Review of Systems  Constitutional: Negative.   HENT: Positive for congestion and postnasal drip. Negative for sinus Morrison.   Respiratory: Positive for shortness of breath.        Minimal cough with sputum production in am with some occasional wheezing.   Cardiovascular: Negative.   Skin: Negative.     Physical Exam  BP 126/82 (BP Location: Right Arm, Cuff Size: Normal)   Pulse 74   Ht 5' 1"  (1.549 m)   Wt 192 lb 9.6 oz (87.4 kg)   SpO2 94%   BMI 36.39 kg/m  Physical Exam  Constitutional: She is oriented to person, place, and time. She appears well-developed and well-nourished.  Obese female, no acute distress   HENT:  Head: Atraumatic.  Eyes: Pupils are equal, round, and reactive to light. EOM are normal.  Neck: Normal range of motion. Neck supple.  Cardiovascular: Normal rate and regular rhythm.  Pulmonary/Chest: Effort normal. No respiratory distress. She has wheezes.  [place] t/o. NO wheeze. On home oxygen   Neurological: She is alert and oriented to person, place, and time.  Skin: Skin is warm and dry.  Psychiatric: She has a normal mood and affect. Her  behavior is normal. Judgment and thought content normal.     Lab Results:  CBC    Component Value Date/Time   WBC 11.6 (H) 04/18/2017 1048   WBC 13.5 (H) 12/08/2015 0330   RBC 4.69 04/18/2017 1048   RBC 4.98 12/08/2015 0330   HGB 14.0 04/18/2017 1048   HCT 42.0 04/18/2017 1048   PLT 319 04/18/2017 1048   MCV 90 04/18/2017 1048   MCV 94 02/26/2011 0424   MCH 29.9 04/18/2017 1048   MCH 30.3 12/08/2015 0330   MCHC 33.3 04/18/2017 1048   MCHC 32.5 12/08/2015 0330   RDW 13.1 04/18/2017 1048   RDW 13.5 02/26/2011 0424   LYMPHSABS 1.6 02/24/2016 1230   LYMPHSABS 0.6 (L) 02/26/2011 0424   MONOABS 0.4 12/04/2015 0511   MONOABS 0.1 02/26/2011 0424   EOSABS 0.1  02/24/2016 1230   EOSABS 0.0 02/26/2011 0424   BASOSABS 0.0 02/24/2016 1230   BASOSABS 0.0 02/26/2011 0424    BMET    Component Value Date/Time   NA 141 04/18/2017 1048   NA 146 (H) 02/26/2011 0424   K 4.5 04/18/2017 1048   K 3.7 02/26/2011 0424   CL 104 04/18/2017 1048   CL 112 (H) 02/26/2011 0424   CO2 21 04/18/2017 1048   CO2 23 02/26/2011 0424   GLUCOSE 123 (H) 04/18/2017 1048   GLUCOSE 147 (H) 12/08/2015 0330   GLUCOSE 137 (H) 02/26/2011 0424   BUN 13 04/18/2017 1048   BUN 12 02/26/2011 0424   CREATININE 0.69 04/18/2017 1048   CREATININE 0.58 (L) 02/26/2011 0424   CALCIUM 9.6 04/18/2017 1048   CALCIUM 9.3 02/26/2011 0424   GFRNONAA 90 04/18/2017 1048   GFRNONAA >60 02/26/2011 0424   GFRAA 103 04/18/2017 1048   GFRAA >60 02/26/2011 0424    BNP No results found for: BNP  ProBNP    Component Value Date/Time   PROBNP 45 04/18/2017 1048    Imaging: No results found.   Assessment & Plan:   67 year old female, Hx moderate COPD. Breathing is stable, not currently exacerbated. More sob this summer. Patient has underlying diastolic heart failure and CAD. EF 45-50%, followed by Dr. Tamala Julian with cardiology last seen in March. BNP 45. [place] on exam. No wheezing, rhonchi or leg edema. 02 sat 94% 4L. She  appears deconditioned, she has been to pulm rehab with some improvement but does not want to return. She does not like leaving her house. Discuss increasing physical activity, encourage walking or stationary biking. Untreated anxiety and depression likely contributing. Recommend she discuss further  Management with her PCP. She could potentially benefit from counseling and/or medication.   Chronic allergic rhinitis Stop afrin Start Dymista and saline spray    Chronic respiratory failure with hypoxia (HCC) Continuous home oxygen   Moderate COPD (chronic obstructive pulmonary disease) (HCC) COPD GOLD II (moderate) Minimal change in PFTs from 2017-2018 Continue Pulmicort and Perforomist nebulizers twice daily Daliresp and Prednisone 3m daily Check HRCT Recommend increase physical activity such as walking/biking  FU in 3 months with Dr. ACandise Che NP 09/19/2017

## 2017-09-19 NOTE — Assessment & Plan Note (Addendum)
COPD GOLD II (moderate) Minimal change in PFTs from 2017-2018 Continue Pulmicort and Perforomist nebulizers twice daily Daliresp and Prednisone 10mg  daily Check HRCT Recommend increase physical activity such as walking/biking  FU in 3 months with Dr. Vassie LollAlva

## 2017-09-19 NOTE — Patient Instructions (Addendum)
Needs HRCT prior to FU in 2-3 months with Dr. Vassie LollAlva please  Speak with PCP about anxiety/depression management, consider medication or counseling   Look into stationary bike at home, walking and increasing physical activity  Sending refills of medications   Stop afrin, sending RX for dymista

## 2017-09-19 NOTE — Assessment & Plan Note (Signed)
Continuous home oxygen

## 2017-09-19 NOTE — Assessment & Plan Note (Signed)
Stop afrin Start Dymista and saline spray

## 2017-10-26 ENCOUNTER — Other Ambulatory Visit: Payer: Self-pay | Admitting: Adult Health

## 2017-11-09 ENCOUNTER — Other Ambulatory Visit: Payer: Self-pay | Admitting: Primary Care

## 2017-11-19 ENCOUNTER — Telehealth: Payer: Self-pay | Admitting: Primary Care

## 2017-11-19 NOTE — Telephone Encounter (Signed)
Called and spoke to pt, who states she has already spoken to [facility] CT and rescheduled 11/20/17 CT. Nothing further is needed.

## 2017-11-20 ENCOUNTER — Inpatient Hospital Stay: Admission: RE | Admit: 2017-11-20 | Payer: Managed Care, Other (non HMO) | Source: Ambulatory Visit

## 2017-11-23 ENCOUNTER — Other Ambulatory Visit: Payer: Self-pay | Admitting: Pulmonary Disease

## 2017-11-23 ENCOUNTER — Other Ambulatory Visit: Payer: Self-pay | Admitting: Primary Care

## 2017-11-25 ENCOUNTER — Other Ambulatory Visit: Payer: Self-pay | Admitting: Interventional Cardiology

## 2017-12-06 ENCOUNTER — Ambulatory Visit (INDEPENDENT_AMBULATORY_CARE_PROVIDER_SITE_OTHER)
Admission: RE | Admit: 2017-12-06 | Discharge: 2017-12-06 | Disposition: A | Discharge status: Home / Self Care | Payer: 59 | Source: Ambulatory Visit | Attending: Primary Care | Admitting: Primary Care

## 2017-12-06 DIAGNOSIS — J449 Chronic obstructive pulmonary disease, unspecified: Secondary | ICD-10-CM

## 2017-12-20 ENCOUNTER — Ambulatory Visit (INDEPENDENT_AMBULATORY_CARE_PROVIDER_SITE_OTHER): Payer: 59 | Admitting: Pulmonary Disease

## 2017-12-20 ENCOUNTER — Encounter: Payer: Self-pay | Admitting: Pulmonary Disease

## 2017-12-20 DIAGNOSIS — J449 Chronic obstructive pulmonary disease, unspecified: Secondary | ICD-10-CM

## 2017-12-20 DIAGNOSIS — J9611 Chronic respiratory failure with hypoxia: Secondary | ICD-10-CM

## 2017-12-20 NOTE — Progress Notes (Signed)
   Subjective:    Patient ID: Erin Morrison, female    DOB: 03/22/1950, 67 y.o.   MRN: [id number]  HPI  Chief Complaint  Patient presents with  . Follow-up    3 month follow up for COPD. Pt had a CT scan earlier this month. States her breathing has slightly improved since her last visit.    67 year old ex-smoker for follow-up of moderate COPD and chronic respiratory failure on O2 since 2017 smoker 1.5-2ppd x 30+ years (quit 2012  She used to be on inhalers but was transitioned to nebulizer therapy since 2018 and has been prednisone dependent.  She is on 10 mg of prednisone and has gained weight. She reports her dyspnea is somewhat improved, she is able to walk around the house, she completed pulmonary rehab in 2018 She is compliant with budesonide, Perforomist and glycopyrrolate nebs  She took her flu shot in 08/2017 and soon after that sick. We reviewed CT chest that was performed 2 weeks ago  Significant tests/ events reviewed HRCT 11/2017 moderate emphysema, mild scarring at lung bases, postinflammatory, no ILD  PFT 03/2016: FVC 2.28 L (80%),FEV1 1.49 L (69%), ratio 66 sig bronchodilator response NO DLCO 07/2015: FVC 2.34 L (82%) FEV1 1.46 L (67%)ratio63sigbronchodilator response DLCO uncorrected 62%  Alpha-1 antitrypsin: MM (169)   Past Medical History:  Diagnosis Date  . Acute DVT (deep venous thrombosis) (HCC)   . Allergic rhinitis   . Atrophic vaginitis   . COPD (chronic obstructive pulmonary disease) (HCC)   . Hypertension   . Low back pain   . Umbilical hernia      Review of Systems neg for any significant sore throat, dysphagia, itching, sneezing, nasal congestion or excess/ purulent secretions, fever, chills, sweats, unintended wt loss, pleuritic or exertional cp, hempoptysis, orthopnea pnd or change in chronic leg swelling. Also denies presyncope, palpitations, heartburn, abdominal pain, nausea, vomiting, diarrhea or change in bowel or urinary habits,  dysuria,hematuria, rash, arthralgias, visual complaints, headache, numbness weakness or ataxia.     Objective:   Physical Exam  Gen. Pleasant, obese, in no distress ENT - no lesions, no post nasal drip Neck: No JVD, no thyromegaly, no carotid bruits Lungs: kyphosis, no use of accessory muscles, no dullness to percussion, decreased without rales or rhonchi  Cardiovascular: Rhythm regular, heart sounds  normal, no murmurs or gallops, no peripheral edema Musculoskeletal: No deformities, no cyanosis or clubbing , no tremors       Assessment & Plan:

## 2017-12-20 NOTE — Patient Instructions (Addendum)
Decrease prednisone to 5 mg daily Starting February 1, decrease prednisone to 5 mg on Monday/Wednesday/Friday.  Okay to decrease albuterol nebs to as needed for shortness of breath for wheezing  Please take vitamin D/calcium supplements while on prednisone  Okay to stay off oxygen while at rest, continue during sleep and when walking

## 2017-12-20 NOTE — Assessment & Plan Note (Signed)
Okay to stay off oxygen while at rest, continue during sleep and when walking

## 2017-12-20 NOTE — Assessment & Plan Note (Signed)
Decrease prednisone to 5 mg daily Starting February 1, decrease prednisone to 5 mg on Monday/Wednesday/Friday.  Okay to decrease albuterol nebs to as needed for shortness of breath for wheezing  Please take vitamin D/calcium supplements while on prednisone Goal would be to taper off prednisone completely and then transition back from nebs to LABA/ LAMA combination if she continues to do well

## 2018-01-09 ENCOUNTER — Other Ambulatory Visit: Payer: Self-pay

## 2018-01-09 DIAGNOSIS — J449 Chronic obstructive pulmonary disease, unspecified: Secondary | ICD-10-CM

## 2018-01-09 MED ORDER — BUDESONIDE 0.5 MG/2ML IN SUSP: RESPIRATORY_TRACT | 3 refills | Status: DC

## 2018-02-05 ENCOUNTER — Telehealth: Payer: Self-pay | Admitting: Pulmonary Disease

## 2018-02-05 NOTE — Telephone Encounter (Signed)
Called and spoke with Irving Burton, she requested OV notes from September of 2019 until now. Printed OV notes within those time frames. Faxed over to number given. Nothing further needed.   [phone]

## 2018-02-13 ENCOUNTER — Telehealth: Payer: Self-pay | Admitting: Pulmonary Disease

## 2018-02-13 MED ORDER — GLYCOPYRROLATE 25 MCG/ML IN SOLN: 25.0000 ug | Freq: Two times a day (BID) | RESPIRATORY_TRACT | 5 refills | Status: DC

## 2018-02-13 NOTE — Telephone Encounter (Signed)
Call made to patient, requesting refill of [place] be sent to [facility] [place]. Confirmed pharmacy. Refill sent. Nothing further is needed at this time.

## 2018-02-17 ENCOUNTER — Other Ambulatory Visit: Payer: Self-pay | Admitting: Pulmonary Disease

## 2018-03-17 ENCOUNTER — Other Ambulatory Visit: Payer: Self-pay | Admitting: Pulmonary Disease

## 2018-03-18 NOTE — Telephone Encounter (Signed)
Called and spoke with pt in regards to the refill of pred 10mg . Asked pt if she was currently taking 10mg  prednisone or if she was taking 5mg  prednisone as directed by RA. Pt stated to me that she is taking 5mg  prednisone Mon, Wed, Fri as directed. Stated to pt that I would fix Rx and send it to preferred pharmacy for her. Pt expressed understanding. Rx sent. Nothing further needed.

## 2018-03-22 ENCOUNTER — Ambulatory Visit: Payer: 59 | Admitting: Primary Care

## 2018-03-22 NOTE — Progress Notes (Signed)
@Patient  ID: Erin Morrison, female    DOB: 09/15/1950, 68 y.o.   MRN: [id number]  Chief Complaint  Patient presents with  . Follow-up    SOB with exertion, occasional post nasal drip    Referring provider: Fae Pippin  HPI: 68 year old female, former smoker 1.5-2ppd x 30+ years (quit 2012). PMH moderate COPD, chronic allergic rhinitis, elevated diaphragm, chronic resp failure, GERD, HTN, CAD, NSTEMI. Patient of Dr. Elsworth Soho, last seen on 12/20/17.   She is on triple combo nebulizer therapy, prednisone 41m daily and daliresp. Plan to decrease prednisone to 525mdaily and starting feb 1 only on MWF. Started on Vit D/calcium. Goal is to taper prednisone off completely and then transition back from neb to LABA/LAMA combination if continues doing well.   03/25/2018 Patient presents today for follow-up visit. Accompanied by younger female family member. She is feeling ok, no acute complaints. Ambulated halls to exam room without oxygen and turned on POC while sitting. Breathing is baseline, states that she can see a difference in her breathing with weaning of daily prednisone. Her main goal is to be able to get through normal day to day activities without feeling short of breath.    PFT's: 03/23/16: FVC 2.28 L (80%),FEV1 1.49 L (69%), ratio 66 sig bronchodilator response NO DLCO 07/13/15: FVC 2.34 L (82%) FEV1 1.46 L (67%)ratio63sigbronchodilator response DLCO uncorrected 62% Testing: Alpha-1 antitrypsin: MM (169)   No Known Allergies  Immunization History  Administered Date(s) Administered  . Influenza Split 10/18/2015  . Influenza, High Dose Seasonal PF 10/24/2016  . Influenza,inj,Quad PF,6+ Mos 09/19/2017  . Pneumococcal Conjugate-13 01/30/2013  . Pneumococcal Polysaccharide-23 02/27/2011    Past Medical History:  Diagnosis Date  . [place]  . Allergic rhinitis   . Atrophic vaginitis   . COPD (chronic obstructive pulmonary disease)  ([facility]  . Hypertension   . Low back Morrison   . Umbilical hernia     Tobacco History: Social History   Tobacco Use  Smoking Status Former Smoker  . Packs/day: 1.50  . Years: 46.00  . Pack years: 69.00  . Types: Cigarettes  . Start date: 01/31/1964  . Last attempt to quit: 01/30/2010  . Years since quitting: 8.1  Smokeless Tobacco Never Used   Counseling given: Not Answered   Outpatient Medications Prior to Visit  Medication Sig Dispense Refill  . acetaminophen (TYLENOL) 500 MG tablet Take 500 mg by mouth 2 (two) times daily as needed for moderate Morrison or headache.    . albuterol (PROVENTIL HFA;VENTOLIN HFA) 108 (90 Base) MCG/ACT inhaler Inhale 2 puffs into the lungs every 6 (six) hours as needed for wheezing or shortness of breath.     . Marland Kitchenlbuterol (PROVENTIL) (2.5 MG/3ML) 0.083% nebulizer solution Take 2.5 mg by nebulization every 6 (six) hours as needed for wheezing or shortness of breath.    . Marland Kitchenspirin 81 MG tablet Take 81 mg by mouth daily.    . Marland Kitchentorvastatin (LIPITOR) 40 MG tablet TAKE 1 TABLET (40 MG TOTAL) BY MOUTH DAILY AT 6 PM. 30 tablet 4  . Azelastine-Fluticasone 137-50 MCG/ACT SUSP Place 1 spray into the nose 2 (two) times daily. 1 Bottle 1  . DALIRESP 500 MCG TABS tablet TAKE 1 TABLET (500 MCG TOTAL) BY MOUTH DAILY. 30 tablet 1  . montelukast (SINGULAIR) 10 MG tablet TAKE 1 TABLET BY MOUTH EVERYDAY AT BEDTIME 30 tablet 5  . Polyethyl Glycol-Propyl Glycol (SYSTANE OP) Apply 1-2 drops  to eye daily as needed (dry eyes).    . predniSONE (DELTASONE) 10 MG tablet Take 42m prednisone Mondays, Wednesdays, Fridays. 30 tablet 1  . Spacer/Aero-Holding Chambers (AEROCHAMBER MV) inhaler Use as instructed 1 each 0  . traMADol (ULTRAM) 50 MG tablet 1/2-1 tab every 6 hours prn Morrison 40 tablet 0  . budesonide (PULMICORT) 0.5 MG/2ML nebulizer solution Use 1 vial via nebulizer twice a day 120 mL 3  . formoterol (PERFOROMIST) 20 MCG/2ML nebulizer solution USE 1 VIAL VIA NEBULIZER TWICE A DAY 120  mL 3  . Glycopyrrolate (LONHALA MAGNAIR REFILL KIT) 25 MCG/ML SOLN Inhale 25 mcg into the lungs 2 (two) times daily. 60 mL 5  . nitroGLYCERIN (NITROSTAT) 0.4 MG SL tablet Place 1 tablet (0.4 mg total) under the tongue every 5 (five) minutes as needed for chest Morrison. 90 tablet 3   No facility-administered medications prior to visit.     Review of Systems  Review of Systems  Constitutional: Negative.   HENT: Negative.   Respiratory: Positive for shortness of breath. Negative for cough and wheezing.   Cardiovascular: Negative.     Physical Exam  BP 132/72 (BP Location: Left Arm, Cuff Size: Normal)   Pulse 60   Ht 5' 1"  (1.549 m)   Wt 199 lb 12.8 oz (90.6 kg)   SpO2 94%   BMI 37.75 kg/m  Physical Exam Constitutional:      General: She is not in acute distress.    Appearance: Normal appearance. She is not ill-appearing.  HENT:     Head: Normocephalic and atraumatic.     Right Ear: Tympanic membrane normal.     Left Ear: Tympanic membrane normal.     Nose: Nose normal.     Mouth/Throat:     Mouth: Mucous membranes are moist.     Pharynx: Oropharynx is clear.  Neck:     Musculoskeletal: Normal range of motion and neck supple.  Cardiovascular:     Rate and Rhythm: Normal rate and regular rhythm.  Pulmonary:     Effort: Pulmonary effort is normal.     Breath sounds: Normal breath sounds.     Comments: Dull exp wheeze right upper lobe, otherwise clear  Musculoskeletal: Normal range of motion.  Skin:    General: Skin is warm and dry.  Neurological:     General: No focal deficit present.     Mental Status: She is alert. Mental status is at baseline.  Psychiatric:        Mood and Affect: Mood normal.        Behavior: Behavior normal.        Thought Content: Thought content normal.        Judgment: Judgment normal.     Comments: Somewhat anxious      Lab Results:  CBC    Component Value Date/Time   WBC 11.6 (H) 04/18/2017 1048   WBC 13.5 (H) 12/08/2015 0330   RBC  4.69 04/18/2017 1048   RBC 4.98 12/08/2015 0330   HGB 14.0 04/18/2017 1048   HCT 42.0 04/18/2017 1048   PLT 319 04/18/2017 1048   MCV 90 04/18/2017 1048   MCV 94 02/26/2011 0424   MCH 29.9 04/18/2017 1048   MCH 30.3 12/08/2015 0330   MCHC 33.3 04/18/2017 1048   MCHC 32.5 12/08/2015 0330   RDW 13.1 04/18/2017 1048   RDW 13.5 02/26/2011 0424   LYMPHSABS 1.6 02/24/2016 1230   LYMPHSABS 0.6 (L) 02/26/2011 0424   MONOABS 0.4 12/04/2015 0511  MONOABS 0.1 02/26/2011 0424   EOSABS 0.1 02/24/2016 1230   EOSABS 0.0 02/26/2011 0424   BASOSABS 0.0 02/24/2016 1230   BASOSABS 0.0 02/26/2011 0424    BMET    Component Value Date/Time   NA 141 04/18/2017 1048   NA 146 (H) 02/26/2011 0424   K 4.5 04/18/2017 1048   K 3.7 02/26/2011 0424   CL 104 04/18/2017 1048   CL 112 (H) 02/26/2011 0424   CO2 21 04/18/2017 1048   CO2 23 02/26/2011 0424   GLUCOSE 123 (H) 04/18/2017 1048   GLUCOSE 147 (H) 12/08/2015 0330   GLUCOSE 137 (H) 02/26/2011 0424   BUN 13 04/18/2017 1048   BUN 12 02/26/2011 0424   CREATININE 0.69 04/18/2017 1048   CREATININE 0.58 (L) 02/26/2011 0424   CALCIUM 9.6 04/18/2017 1048   CALCIUM 9.3 02/26/2011 0424   GFRNONAA 90 04/18/2017 1048   GFRNONAA >60 02/26/2011 0424   GFRAA 103 04/18/2017 1048   GFRAA >60 02/26/2011 0424    BNP No results found for: BNP  ProBNP    Component Value Date/Time   PROBNP 45 04/18/2017 1048    Imaging: No results found.   Assessment & Plan:   Moderate COPD (chronic obstructive pulmonary disease) ([place]) - Per Dr. Bari Mantis last OV he recommends trying to change nebulizer therapy to LABA/LAMA combination inhaler, patient would also like to try and come off triple nebulizer regimen  - Stop pulmicort, perforomist and lonhala  - Start Stiolto 2 puffs once daily (this is covered by her insurance) - PRN albuterol nebulizer every 4-6 hours for sob/wheezing  - Continue prednisone 50m MWF for now  - If patient is missing ICS component would  recommend changing to Symbicort/spiriva  - Follow up in 2-4 weeks with NP   Chronic respiratory failure with hypoxia (HCC) - Continue 2L oxygen with exertion and at night - Ok to be off oxygen at rest    EMartyn Ehrich NP 03/25/2018

## 2018-03-25 ENCOUNTER — Ambulatory Visit (INDEPENDENT_AMBULATORY_CARE_PROVIDER_SITE_OTHER): Payer: 59 | Admitting: Primary Care

## 2018-03-25 ENCOUNTER — Encounter: Payer: Self-pay | Admitting: Primary Care

## 2018-03-25 DIAGNOSIS — J9611 Chronic respiratory failure with hypoxia: Secondary | ICD-10-CM | POA: Diagnosis not present

## 2018-03-25 DIAGNOSIS — J449 Chronic obstructive pulmonary disease, unspecified: Secondary | ICD-10-CM

## 2018-03-25 MED ORDER — TIOTROPIUM BROMIDE-OLODATEROL 2.5-2.5 MCG/ACT IN AERS: 2.0000 | INHALATION_SPRAY | Freq: Every day | RESPIRATORY_TRACT | 0 refills | Status: DC

## 2018-03-25 NOTE — Patient Instructions (Addendum)
STOP Pulmicort, perforomist and glycopyrrolate (HOLD onto these medications)  START Stiolto 2 puffs daily  Use Albuterol nebulizer every 4-6 hours as needed for shortness of breath/wheezing   Continue prednisone 5mg  MWF  Continue oxygen 2L on exertion and at night  Follow-up in 2-4 weeks with Waynetta Sandy NP

## 2018-03-25 NOTE — Assessment & Plan Note (Signed)
-   Per Dr. Reginia Naas last OV he recommends trying to change nebulizer therapy to LABA/LAMA combination inhaler, patient would also like to try and come off triple nebulizer regimen  - Stop pulmicort, perforomist and lonhala  - Start Stiolto 2 puffs once daily (this is covered by her insurance) - PRN albuterol nebulizer every 4-6 hours for sob/wheezing  - Continue prednisone 5mg  MWF for now  - If patient is missing ICS component would recommend changing to Symbicort/spiriva  - Follow up in 2-4 weeks with NP

## 2018-03-25 NOTE — Assessment & Plan Note (Signed)
-   Continue 2L oxygen with exertion and at night - Ok to be off oxygen at rest

## 2018-04-08 ENCOUNTER — Encounter: Payer: Self-pay | Admitting: Primary Care

## 2018-04-08 ENCOUNTER — Ambulatory Visit (INDEPENDENT_AMBULATORY_CARE_PROVIDER_SITE_OTHER): Payer: 59 | Admitting: Primary Care

## 2018-04-08 VITALS — BP 126/68 | HR 74 | Ht 61.0 in | Wt 198.4 lb

## 2018-04-08 DIAGNOSIS — J449 Chronic obstructive pulmonary disease, unspecified: Secondary | ICD-10-CM

## 2018-04-08 DIAGNOSIS — M545 Low back pain, unspecified: Secondary | ICD-10-CM

## 2018-04-08 DIAGNOSIS — M549 Dorsalgia, unspecified: Secondary | ICD-10-CM | POA: Diagnosis not present

## 2018-04-08 DIAGNOSIS — N390 Urinary tract infection, site not specified: Secondary | ICD-10-CM | POA: Diagnosis not present

## 2018-04-08 HISTORY — DX: Low back pain, unspecified: M54.50

## 2018-04-08 LAB — URINALYSIS, ROUTINE W REFLEX MICROSCOPIC
Bilirubin Urine: NEGATIVE
KETONES UR: NEGATIVE
Leukocytes,Ua: NEGATIVE
Nitrite: POSITIVE — AB
Specific Gravity, Urine: >=1.03 — AB (ref 1.000–1.030)
Total Protein, Urine: NEGATIVE
URINE GLUCOSE: NEGATIVE
UROBILINOGEN UA: 0.2 (ref 0.0–1.0)
pH: 5.5 (ref 5.0–8.0)

## 2018-04-08 MED ORDER — TIOTROPIUM BROMIDE-OLODATEROL 2.5-2.5 MCG/ACT IN AERS: 2.0000 | INHALATION_SPRAY | Freq: Every day | RESPIRATORY_TRACT | 3 refills | Status: DC

## 2018-04-08 MED ORDER — ROFLUMILAST 500 MCG PO TABS: ORAL_TABLET | ORAL | 4 refills | Status: DC

## 2018-04-08 NOTE — Progress Notes (Addendum)
 @Patient  ID: Erin Morrison, female    DOB: 08/20/1950, 68 y.o.   MRN: [id number]  Chief Complaint  Patient presents with  . Follow-up    breathing better, cough with occasional clear mucusinceased wheezing    Referring provider: Arlyss Queenonroy, Nathan, PA-C  HPI: 68 year old female, former smoker 1.5-2ppd x 30+ years (quit 2012). PMH moderate COPD, chronic allergic rhinitis, elevated diaphragm, chronic resp failure, GERD, HTN, CAD, NSTEMI. Patient of Dr. Vassie LollAlva, last seen on 12/20/17.   She is on triple combo nebulizer therapy, prednisone 10mg  daily and daliresp. Plan to decrease prednisone to 5mg  daily and starting feb 1 only on MWF. Started on Vit D/calcium. Goal is to taper prednisone off completely and then transition back from neb to LABA/LAMA combination if continues doing well.   03/25/2018 Patient presents today for follow-up visit. Accompanied by younger female family member. She is feeling ok, no acute complaints. Ambulated halls to exam room without oxygen and turned on POC while sitting. Breathing is baseline, states that she can see a difference in her breathing with weaning off daily prednisone. Her main goal is to be able to get through normal day to day activities without feeling short of breath.   04/10/2018 Patient presents today for 2 week follow-up. Accompanied by family member. Feels her breathing is a little better. Pulmicort, Perforomist and Lonhala were stopped and she was switched over to Stiolto 2 puffs daily. Using less Albuterol; she was using nebulizer 3-4 times a day, now only requires 1-2 times a day. She has her oxygen with her today but did not need it while walking into visit. Uses as needed on exertion and at night. Continues prednisone 5mg  MWF. Previously did not tolerate decrease in daily prednisone dose. Complains of bilateral lower back pain and urinary frequency, checking urine culture.  PFT's: 03/23/16: FVC 2.28 L (80%),FEV1 1.49 L (69%), ratio 66 sig  bronchodilator response NO DLCO 07/13/15: FVC 2.34 L (82%) FEV1 1.46 L (67%)ratio63sigbronchodilator response DLCO uncorrected 62% Testing: Alpha-1 antitrypsin: MM (169)   No Known Allergies  Immunization History  Administered Date(s) Administered  . Influenza Split 10/18/2015  . Influenza, High Dose Seasonal PF 10/24/2016  . Influenza,inj,Quad PF,6+ Mos 09/19/2017  . Pneumococcal Conjugate-13 01/30/2013  . Pneumococcal Polysaccharide-23 02/27/2011    Past Medical History:  Diagnosis Date  . Acute DVT (deep venous thrombosis) (HCC)   . Allergic rhinitis   . Atrophic vaginitis   . COPD (chronic obstructive pulmonary disease) (HCC)   . Hypertension   . Low back pain   . Umbilical hernia     Tobacco History: Social History   Tobacco Use  Smoking Status Former Smoker  . Packs/day: 1.50  . Years: 46.00  . Pack years: 69.00  . Types: Cigarettes  . Start date: 01/31/1964  . Last attempt to quit: 01/30/2010  . Years since quitting: 8.1  Smokeless Tobacco Never Used   Counseling given: Not Answered   Outpatient Medications Prior to Visit  Medication Sig Dispense Refill  . acetaminophen (TYLENOL) 500 MG tablet Take 500 mg by mouth 2 (two) times daily as needed for moderate pain or headache.    . albuterol (PROVENTIL HFA;VENTOLIN HFA) 108 (90 Base) MCG/ACT inhaler Inhale 2 puffs into the lungs every 6 (six) hours as needed for wheezing or shortness of breath.     Marland Kitchen. albuterol (PROVENTIL) (2.5 MG/3ML) 0.083% nebulizer solution Take 2.5 mg by nebulization every 6 (six) hours as needed for wheezing or shortness of breath.    .Marland Kitchen  aspirin 81 MG tablet Take 81 mg by mouth daily.    Marland Kitchen atorvastatin (LIPITOR) 40 MG tablet TAKE 1 TABLET (40 MG TOTAL) BY MOUTH DAILY AT 6 PM. 30 tablet 4  . Azelastine-Fluticasone 137-50 MCG/ACT SUSP Place 1 spray into the nose 2 (two) times daily. 1 Bottle 1  . montelukast (SINGULAIR) 10 MG tablet TAKE 1 TABLET BY MOUTH EVERYDAY AT BEDTIME 30 tablet 5  .  Polyethyl Glycol-Propyl Glycol (SYSTANE OP) Apply 1-2 drops to eye daily as needed (dry eyes).    . predniSONE (DELTASONE) 10 MG tablet Take 5mg  prednisone Mondays, Wednesdays, Fridays. 30 tablet 1  . Spacer/Aero-Holding Chambers (AEROCHAMBER MV) inhaler Use as instructed 1 each 0  . traMADol (ULTRAM) 50 MG tablet 1/2-1 tab every 6 hours prn pain 40 tablet 0  . DALIRESP 500 MCG TABS tablet TAKE 1 TABLET (500 MCG TOTAL) BY MOUTH DAILY. 30 tablet 1  . Tiotropium Bromide-Olodaterol (STIOLTO RESPIMAT) 2.5-2.5 MCG/ACT AERS Inhale 2 puffs into the lungs daily. 1 Inhaler 0  . nitroGLYCERIN (NITROSTAT) 0.4 MG SL tablet Place 1 tablet (0.4 mg total) under the tongue every 5 (five) minutes as needed for chest pain. 90 tablet 3   No facility-administered medications prior to visit.     Review of Systems  Review of Systems  Constitutional: Negative.   HENT: Negative.   Respiratory: Positive for wheezing. Negative for cough and shortness of breath.   Cardiovascular: Negative.   Gastrointestinal: Negative.   Genitourinary: Positive for frequency.  Musculoskeletal: Positive for back pain.    Physical Exam  BP 126/68 (BP Location: Right Arm, Cuff Size: Normal)   Pulse 74   Ht 5\' 1"  (1.549 m)   Wt 198 lb 6.4 oz (90 kg)   SpO2 94%   BMI 37.49 kg/m  Physical Exam Constitutional:      General: She is not in acute distress.    Appearance: She is well-developed. She is not ill-appearing.  HENT:     Head: Normocephalic and atraumatic.     Right Ear: Tympanic membrane normal.     Left Ear: Tympanic membrane normal.     Mouth/Throat:     Mouth: Mucous membranes are moist.     Pharynx: Oropharynx is clear.  Eyes:     Pupils: Pupils are equal, round, and reactive to light.  Neck:     Musculoskeletal: Normal range of motion and neck supple.  Cardiovascular:     Rate and Rhythm: Normal rate and regular rhythm.     Heart sounds: Normal heart sounds. No murmur.  Pulmonary:     Effort: Pulmonary  effort is normal. No respiratory distress.     Breath sounds: Normal breath sounds. No wheezing.  Skin:    General: Skin is warm and dry.     Findings: No erythema or rash.  Neurological:     General: No focal deficit present.     Mental Status: She is alert and oriented to person, place, and time. Mental status is at baseline.  Psychiatric:        Mood and Affect: Mood normal.        Behavior: Behavior normal.        Thought Content: Thought content normal.        Judgment: Judgment normal.      Lab Results:  CBC    Component Value Date/Time   WBC 11.6 (H) 04/18/2017 1048   WBC 13.5 (H) 12/08/2015 0330   RBC 4.69 04/18/2017 1048   RBC  4.98 12/08/2015 0330   HGB 14.0 04/18/2017 1048   HCT 42.0 04/18/2017 1048   PLT 319 04/18/2017 1048   MCV 90 04/18/2017 1048   MCV 94 02/26/2011 0424   MCH 29.9 04/18/2017 1048   MCH 30.3 12/08/2015 0330   MCHC 33.3 04/18/2017 1048   MCHC 32.5 12/08/2015 0330   RDW 13.1 04/18/2017 1048   RDW 13.5 02/26/2011 0424   LYMPHSABS 1.6 02/24/2016 1230   LYMPHSABS 0.6 (L) 02/26/2011 0424   MONOABS 0.4 12/04/2015 0511   MONOABS 0.1 02/26/2011 0424   EOSABS 0.1 02/24/2016 1230   EOSABS 0.0 02/26/2011 0424   BASOSABS 0.0 02/24/2016 1230   BASOSABS 0.0 02/26/2011 0424    BMET    Component Value Date/Time   NA 141 04/18/2017 1048   NA 146 (H) 02/26/2011 0424   K 4.5 04/18/2017 1048   K 3.7 02/26/2011 0424   CL 104 04/18/2017 1048   CL 112 (H) 02/26/2011 0424   CO2 21 04/18/2017 1048   CO2 23 02/26/2011 0424   GLUCOSE 123 (H) 04/18/2017 1048   GLUCOSE 147 (H) 12/08/2015 0330   GLUCOSE 137 (H) 02/26/2011 0424   BUN 13 04/18/2017 1048   BUN 12 02/26/2011 0424   CREATININE 0.69 04/18/2017 1048   CREATININE 0.58 (L) 02/26/2011 0424   CALCIUM 9.6 04/18/2017 1048   CALCIUM 9.3 02/26/2011 0424   GFRNONAA 90 04/18/2017 1048   GFRNONAA >60 02/26/2011 0424   GFRAA 103 04/18/2017 1048   GFRAA >60 02/26/2011 0424    BNP No results found  for: BNP  ProBNP    Component Value Date/Time   PROBNP 45 04/18/2017 1048    Imaging: No results found.   Assessment & Plan:   Moderate COPD (chronic obstructive pulmonary disease) (HCC) - Successfully switched nebulized medications to [facility] 03/25/18  - Breathing is better, requiring less Albuterol  - Continue prednisone 5mg  MWF (poorly tolerated decreasing prednisone dose) - Continue Daliresp 500mg  daily  - Follow up in 3 months, consider tapering off prednisone during next visit   Lower back pain - Complains of bilateral lower back pain with associated urinary frequency - Back pain is listed as common reaction to Stiolto, however, patient reports benefit from use so will continue to monitor  - Checking UA C&S  UTI (urinary tract infection) - Urine culture positive for e.coli - Rx Bactrim DS 1 tab twice daily x 5 days    Glenford Bayley, NP 04/10/2018

## 2018-04-08 NOTE — Assessment & Plan Note (Addendum)
-   Successfully switched nebulized medications to [facility] 03/25/18  - Breathing is better, requiring less Albuterol  - Continue prednisone 5mg  MWF (poorly tolerated decreasing prednisone dose) - Continue Daliresp 500mg  daily  - Follow up in 3 months, consider tapering off prednisone during next visit

## 2018-04-08 NOTE — Assessment & Plan Note (Signed)
-   Complains of bilateral lower back pain with associated urinary frequency - Back pain is listed as common reaction to Stiolto, however, patient reports benefit from use so will continue to monitor  - Checking UA C&S

## 2018-04-08 NOTE — Patient Instructions (Signed)
Continue Stiolto 2 puffs once daily  Use Albuterol nebulizer if needed every 4-6 hours for shortness of breath/wheezing  Follow up in 3 months with Dr. Vassie Loll or if symptoms worsen   May consider tapering off prednisone at next visit if doing well

## 2018-04-10 DIAGNOSIS — N390 Urinary tract infection, site not specified: Secondary | ICD-10-CM | POA: Insufficient documentation

## 2018-04-10 HISTORY — DX: Urinary tract infection, site not specified: N39.0

## 2018-04-10 LAB — URINE CULTURE
MICRO NUMBER:: 293663
SPECIMEN QUALITY:: ADEQUATE

## 2018-04-10 MED ORDER — SULFAMETHOXAZOLE-TRIMETHOPRIM 800-160 MG PO TABS: 1.0000 | ORAL_TABLET | Freq: Two times a day (BID) | ORAL | 0 refills | Status: DC

## 2018-04-10 NOTE — Assessment & Plan Note (Signed)
-   Urine culture positive for e.coli - Rx Bactrim DS 1 tab twice daily x 5 days

## 2018-04-10 NOTE — Addendum Note (Signed)
Addended by: Glenford Bayley on: 04/10/2018 11:38 AM   Modules accepted: Orders

## 2018-04-14 ENCOUNTER — Other Ambulatory Visit: Payer: Self-pay | Admitting: Adult Health

## 2018-05-13 ENCOUNTER — Other Ambulatory Visit: Payer: Self-pay | Admitting: Interventional Cardiology

## 2018-07-23 ENCOUNTER — Ambulatory Visit: Payer: 59 | Admitting: Pulmonary Disease

## 2018-07-29 ENCOUNTER — Other Ambulatory Visit: Payer: Self-pay | Admitting: Primary Care

## 2018-07-29 ENCOUNTER — Other Ambulatory Visit: Payer: Self-pay | Admitting: Pulmonary Disease

## 2018-07-29 DIAGNOSIS — J449 Chronic obstructive pulmonary disease, unspecified: Secondary | ICD-10-CM

## 2018-08-05 ENCOUNTER — Ambulatory Visit (INDEPENDENT_AMBULATORY_CARE_PROVIDER_SITE_OTHER): Payer: 59 | Admitting: Primary Care

## 2018-08-05 ENCOUNTER — Encounter: Payer: Self-pay | Admitting: Primary Care

## 2018-08-05 ENCOUNTER — Other Ambulatory Visit: Payer: Self-pay

## 2018-08-05 DIAGNOSIS — J449 Chronic obstructive pulmonary disease, unspecified: Secondary | ICD-10-CM

## 2018-08-05 MED ORDER — PREDNISONE 10 MG PO TABS: ORAL_TABLET | ORAL | 0 refills | Status: DC

## 2018-08-05 MED ORDER — BUDESONIDE 0.5 MG/2ML IN SUSP: 0.5000 mg | Freq: Two times a day (BID) | RESPIRATORY_TRACT | 6 refills | Status: DC

## 2018-08-05 MED ORDER — STIOLTO RESPIMAT 2.5-2.5 MCG/ACT IN AERS: 2.0000 | INHALATION_SPRAY | Freq: Every day | RESPIRATORY_TRACT | 3 refills | Status: DC

## 2018-08-05 MED ORDER — DOXYCYCLINE HYCLATE 100 MG PO TABS: 100.0000 mg | ORAL_TABLET | Freq: Two times a day (BID) | ORAL | 0 refills | Status: DC

## 2018-08-05 NOTE — Patient Instructions (Addendum)
Continue Stiolto - 2 puffs once daily  Resume Pulmicort nebulizer twice daily  Use Albuterol every 6 hours for breakthrough shortness of breath   Treating for COPD exacerbation with steroid taper and antibiotic  RX: Doxycycline 1 tab twice daily x 7 days Take prednisone taper as prscribed (40mg  x 2 days; then 30mg  x2 days; 20mg  x 2 days;10mg  x 2 days- then resume 5mg  daily)  Follow-up: Televisit in 2 weeks with NP

## 2018-08-05 NOTE — Assessment & Plan Note (Signed)
-   Acute exacerbation with increased sob and productive cough - RX doxycycline 1 tab BID x 7 days and prednisone taper (40mg  x 2 days; 30mg  x 2 days; 20mg  x 2 days; 10mg  x 2 days- then resume 5mg  daily)  - Continue Stiolto 2 puffs once daily - Add back pulmicort nebulizer twice daily - Encourage pulmonary rehab exercises at home and increasing activity level - FU in 2 week televisit with NP

## 2018-08-05 NOTE — Progress Notes (Signed)
@Patient  ID: Renee Pain, female    DOB: 1950-11-09, 68 y.o.   MRN: [id number]  Chief Complaint  Patient presents with  . Shortness of Breath    3 month fu, increased sob and cough    Referring provider: Fae Pippin  HPI: 68 year old female, former smoker 1.5-2ppd x 30+ years (quit 2012). PMH moderate COPD, chronic allergic rhinitis, elevated diaphragm, chronic resp failure, GERD, HTN, CAD, NSTEMI. Patient of Dr. Elsworth Soho.   She is on triple combo nebulizer therapy, prednisone 10mg  daily and daliresp. Plan to decrease prednisone to 5mg  daily and starting feb 1 only on MWF. Started on Vit D/calcium. Goal is to taper prednisone off completely and then transition back from neb to LABA/LAMA combination if continues doing well.   Previous [place] encounter: 03/25/2018 Patient presents today for follow-up visit. Accompanied by younger female family member. She is feeling ok, no acute complaints. Ambulated halls to exam room without oxygen and turned on POC while sitting. Breathing is baseline, states that she can see a difference in her breathing with weaning off daily prednisone. Her main goal is to be able to get through normal day to day activities without feeling short of breath.   04/10/2018 Patient presents today for 2 week follow-up. Accompanied by family member. Feels her breathing is a little better. Pulmicort, Perforomist and Lonhala were stopped and she was switched over to Stiolto 2 puffs daily. Using less Albuterol; she was using nebulizer 3-4 times a day, now only requires 1-2 times a day. She has her oxygen with her today but did not need it while walking into visit. Uses as needed on exertion and at night. Continues prednisone 5mg  MWF. Previously did not tolerate decrease in daily prednisone dose. Complains of bilateral lower back pain and urinary frequency, checking urine culture.   08/05/2018 Patient presents today for 3-4 month follow-up. Daughter present on speaker  phone. Reports that she has been having more shortness of breath since May. Unsure if it is weather related. Coughing up some yellow mucus. Some confusion whether or not she has been taking Stiolto because she reports that she did not notice a difference on it. Continues prednisone 5mg  daily, Daliresp and Singulair as prescribed. She has not been very active, has been to pulmonary rehab in the past and not interested in returning. Denies fever, exposure to covid, flu-like illness.    PFT's: 03/23/16: FVC 2.28 L (80%),FEV1 1.49 L (69%), ratio 66 sig bronchodilator response NO DLCO 07/13/15: FVC 2.34 L (82%) FEV1 1.46 L (67%)ratio63sigbronchodilator response DLCO uncorrected 62%  Testing: Alpha-1 antitrypsin: MM (169)   No Known Allergies  Immunization History  Administered Date(s) Administered  . Influenza Split 10/18/2015  . Influenza, High Dose Seasonal PF 10/24/2016  . Influenza,inj,Quad PF,6+ Mos 09/19/2017  . Pneumococcal Conjugate-13 01/30/2013  . Pneumococcal Polysaccharide-23 02/27/2011    Past Medical History:  Diagnosis Date  . Acute DVT (deep venous thrombosis) ([place])   . Allergic rhinitis   . Atrophic vaginitis   . COPD (chronic obstructive pulmonary disease) ([place])   . Hypertension   . Low back pain   . Umbilical hernia     Tobacco History: Social History   Tobacco Use  Smoking Status Former Smoker  . Packs/day: 1.50  . Years: 46.00  . Pack years: 69.00  . Types: Cigarettes  . Start date: 01/31/1964  . Quit date: 01/30/2010  . Years since quitting: 8.5  Smokeless Tobacco Never Used   Counseling given: Not Answered  Outpatient Medications Prior to Visit  Medication Sig Dispense Refill  . acetaminophen (TYLENOL) 500 MG tablet Take 500 mg by mouth 2 (two) times daily as needed for moderate pain or headache.    . albuterol (PROVENTIL HFA;VENTOLIN HFA) 108 (90 Base) MCG/ACT inhaler Inhale 2 puffs into the lungs every 6 (six) hours as needed for wheezing or  shortness of breath.     Marland Kitchen. albuterol (PROVENTIL) (2.5 MG/3ML) 0.083% nebulizer solution Take 2.5 mg by nebulization every 6 (six) hours as needed for wheezing or shortness of breath.    Marland Kitchen. aspirin 81 MG tablet Take 81 mg by mouth daily.    Marland Kitchen. atorvastatin (LIPITOR) 40 MG tablet TAKE 1 TABLET (40 MG TOTAL) BY MOUTH DAILY AT 6 PM. 30 tablet 4  . Azelastine-Fluticasone 137-50 MCG/ACT SUSP Place 1 spray into the nose 2 (two) times daily. 1 Bottle 1  . montelukast (SINGULAIR) 10 MG tablet TAKE 1 TABLET BY MOUTH EVERYDAY AT BEDTIME 30 tablet 5  . Polyethyl Glycol-Propyl Glycol (SYSTANE OP) Apply 1-2 drops to eye daily as needed (dry eyes).    . predniSONE (DELTASONE) 10 MG tablet TAKE 1/2 TABLET BY MOUTH MONDAYS, WEDNESDAYS AND FRIDAYS 12 tablet 4  . roflumilast (DALIRESP) 500 MCG TABS tablet TAKE 1 TABLET (500 MCG TOTAL) BY MOUTH DAILY. 30 tablet 4  . Spacer/Aero-Holding Chambers (AEROCHAMBER MV) inhaler Use as instructed 1 each 0  . sulfamethoxazole-trimethoprim (BACTRIM DS,SEPTRA DS) 800-160 MG tablet Take 1 tablet by mouth 2 (two) times daily. 10 tablet 0  . traMADol (ULTRAM) 50 MG tablet 1/2-1 tab every 6 hours prn pain 40 tablet 0  . nitroGLYCERIN (NITROSTAT) 0.4 MG SL tablet Place 1 tablet (0.4 mg total) under the tongue every 5 (five) minutes as needed for chest pain. 90 tablet 3  . STIOLTO RESPIMAT 2.5-2.5 MCG/ACT AERS INHALE 2 PUFFS BY MOUTH INTO THE LUNGS DAILY (Patient not taking: Reported on 08/05/2018) 4 g 3   No facility-administered medications prior to visit.    Review of Systems  Review of Systems  Constitutional: Negative.   HENT: Positive for postnasal drip.   Respiratory: Positive for cough, shortness of breath and wheezing.   Cardiovascular: Negative.    Physical Exam  BP 132/80 (BP Location: Right Arm, Patient Position: Sitting, Cuff Size: Normal)   Pulse 92   Temp 98.5 F (36.9 C) (Oral)   Ht 5\' 1"  (1.549 m)   Wt 196 lb 6.4 oz (89.1 kg)   SpO2 93%   BMI 37.11 kg/m   Physical Exam Constitutional:      General: She is not in acute distress.    Appearance: Normal appearance. She is obese. She is not ill-appearing.  HENT:     Head: Normocephalic and atraumatic.  Cardiovascular:     Rate and Rhythm: Normal rate and regular rhythm.  Pulmonary:     Breath sounds: Wheezing present.     Comments: LS with insp wheezes t/o; home oxygen  Skin:    General: Skin is warm and dry.  Neurological:     General: No focal deficit present.     Mental Status: She is alert and oriented to person, place, and time. Mental status is at baseline.  Psychiatric:        Mood and Affect: Mood normal.        Behavior: Behavior normal.        Thought Content: Thought content normal.        Judgment: Judgment normal.  Lab Results:  CBC    Component Value Date/Time   WBC 11.6 (H) 04/18/2017 1048   WBC 13.5 (H) 12/08/2015 0330   RBC 4.69 04/18/2017 1048   RBC 4.98 12/08/2015 0330   HGB 14.0 04/18/2017 1048   HCT 42.0 04/18/2017 1048   PLT 319 04/18/2017 1048   MCV 90 04/18/2017 1048   MCV 94 02/26/2011 0424   MCH 29.9 04/18/2017 1048   MCH 30.3 12/08/2015 0330   MCHC 33.3 04/18/2017 1048   MCHC 32.5 12/08/2015 0330   RDW 13.1 04/18/2017 1048   RDW 13.5 02/26/2011 0424   LYMPHSABS 1.6 02/24/2016 1230   LYMPHSABS 0.6 (L) 02/26/2011 0424   MONOABS 0.4 12/04/2015 0511   MONOABS 0.1 02/26/2011 0424   EOSABS 0.1 02/24/2016 1230   EOSABS 0.0 02/26/2011 0424   BASOSABS 0.0 02/24/2016 1230   BASOSABS 0.0 02/26/2011 0424    BMET    Component Value Date/Time   NA 141 04/18/2017 1048   NA 146 (H) 02/26/2011 0424   K 4.5 04/18/2017 1048   K 3.7 02/26/2011 0424   CL 104 04/18/2017 1048   CL 112 (H) 02/26/2011 0424   CO2 21 04/18/2017 1048   CO2 23 02/26/2011 0424   GLUCOSE 123 (H) 04/18/2017 1048   GLUCOSE 147 (H) 12/08/2015 0330   GLUCOSE 137 (H) 02/26/2011 0424   BUN 13 04/18/2017 1048   BUN 12 02/26/2011 0424   CREATININE 0.69 04/18/2017 1048    CREATININE 0.58 (L) 02/26/2011 0424   CALCIUM 9.6 04/18/2017 1048   CALCIUM 9.3 02/26/2011 0424   GFRNONAA 90 04/18/2017 1048   GFRNONAA >60 02/26/2011 0424   GFRAA 103 04/18/2017 1048   GFRAA >60 02/26/2011 0424    BNP No results found for: BNP  ProBNP    Component Value Date/Time   PROBNP 45 04/18/2017 1048    Imaging: No results found.   Assessment & Plan:   Moderate COPD (chronic obstructive pulmonary disease) (HCC) - Acute exacerbation with increased sob and productive cough - RX doxycycline 1 tab BID x 7 days and prednisone taper (40mg  x 2 days; 30mg  x 2 days; 20mg  x 2 days; 10mg  x 2 days- then resume 5mg  daily)  - Continue Stiolto 2 puffs once daily - Add back pulmicort nebulizer twice daily - Encourage pulmonary rehab exercises at home and increasing activity level - FU in 2 week televisit with NP      Glenford BayleyElizabeth W Mckinnley Cottier, NP 08/05/2018

## 2018-08-21 ENCOUNTER — Ambulatory Visit (INDEPENDENT_AMBULATORY_CARE_PROVIDER_SITE_OTHER): Payer: 59 | Admitting: Primary Care

## 2018-08-21 ENCOUNTER — Encounter: Payer: Self-pay | Admitting: Primary Care

## 2018-08-21 ENCOUNTER — Other Ambulatory Visit: Payer: Self-pay

## 2018-08-21 DIAGNOSIS — J449 Chronic obstructive pulmonary disease, unspecified: Secondary | ICD-10-CM

## 2018-08-21 DIAGNOSIS — J441 Chronic obstructive pulmonary disease with (acute) exacerbation: Secondary | ICD-10-CM

## 2018-08-21 MED ORDER — DALIRESP 500 MCG PO TABS: ORAL_TABLET | ORAL | 5 refills | Status: DC

## 2018-08-21 NOTE — Progress Notes (Signed)
Virtual Visit via Telephone Note  I connected with Erin Morrison on 08/21/18 at  9:00 AM EDT by telephone and verified that I am speaking with the correct person using two identifiers.  Location: Patient: Home Provider: Office   I discussed the limitations, risks, security and privacy concerns of performing an evaluation and management service by telephone and the availability of in person appointments. I also discussed with the patient that there may be a patient responsible charge related to this service. The patient expressed understanding and agreed to proceed.   History of Present Illness: 68 year old female, former smoker 1.5-2ppd x 30+ years (quit 2012). PMH moderate COPD, chronic allergic rhinitis, elevated diaphragm, chronic resp failure, GERD, HTN, CAD, NSTEMI. Patient of Dr. Vassie LollAlva.   She is on triple combo nebulizer therapy, prednisone daily and daliresp. Plan to decrease prednisone to 5mg  daily and starting feb 1 only on MWF. Started on Vit D/calcium. Goal is to taper prednisone off completely and then transition back from neb to LABA/LAMA combination if continues doing well.   Previous [place] encounter: 03/25/2018 Patient presents today for follow-up visit. Accompanied by younger female family member. She is feeling ok, no acute complaints. Ambulated halls to exam room without oxygen and turned on POC while sitting. Breathing is baseline, states that she can see a difference in her breathing with weaning off daily prednisone. Her main goal is to be able to get through normal day to day activities without feeling short of breath.   04/10/2018 Patient presents today for 2 week follow-up. Accompanied by family member. Feels her breathing is a little better. Pulmicort, Perforomist and Lonhala were stopped and she was switched over to Stiolto 2 puffs daily. Using less Albuterol; she was using nebulizer 3-4 times a day, now only requires 1-2 times a day. She has her oxygen with her  today but did not need it while walking into visit. Uses as needed on exertion and at night. Continues prednisone 5mg  MWF. Previously did not tolerate decrease in daily prednisone dose. Complains of bilateral lower back pain and urinary frequency, checking urine culture.   08/05/2018 Patient presents today for 3-4 month follow-up. Daughter present on speaker phone. Reports that she has been having more shortness of breath since May. Unsure if it is weather related. Coughing up some yellow mucus. Some confusion whether or not she has been taking Stiolto because she reports that she did not notice a difference on it. Continues prednisone 5mg  daily, Daliresp and Singulair as prescribed. She has not been very active, has been to pulmonary rehab in the past and not interested in returning. Denies fever, exposure to covid, flu-like illness. Wheezing throughout lung fields.Treated for COPD exacerbation with doxycycline and prednisone taper. Continues stiolto and asked to resume Pulmicort nebulizer.   08/21/2018 Patient called today for 2 week follow-up for COPD exacerbation. States that she is feeling much improved. Shortness of breath and cough have resolved. Using stiolto and Pulmicort nebulizer as prescribed. Needs refill daliresp.    PFT's: 03/23/16: FVC 2.28 L (80%),FEV1 1.49 L (69%), ratio 66 sig bronchodilator response NO DLCO 07/13/15: FVC 2.34 L (82%) FEV1 1.46 L (67%)ratio63sigbronchodilator response DLCO uncorrected 62%  Testing: Alpha-1 antitrypsin: MM (169)    Observations/Objective:  -No shortness of breath, cough or wheezing noted   Assessment and Plan:  COPD exacerbation - Resolved with doxycycline and prednisone taper  COPD  - Continue Stiolto 2 puffs once daily - Continue Pulmicort nebulizer twice daily - Continue Daliresp daily -  Continue 5mg  prednisone on MWF  Follow Up Instructions:   3 months with Dr. Elsworth Soho or sooner if needed   I discussed the assessment and  treatment plan with the patient. The patient was provided an opportunity to ask questions and all were answered. The patient agreed with the plan and demonstrated an understanding of the instructions.   The patient was advised to call back or seek an in-person evaluation if the symptoms worsen or if the condition fails to improve as anticipated.  I provided 10 minutes of non-face-to-face time during this encounter.   Martyn Ehrich, NP

## 2018-08-21 NOTE — Patient Instructions (Addendum)
Glad you are feeling better!  Recommendations: - Continue Stiolto 2 puffs once daily - Continue Pulmicort nebulizer twice daily - Continue Daliresp daily - Continue 5mg  prednisone on MWF   Follow-up: - 3 months with Dr. Elsworth Soho or sooner if needed

## 2018-10-04 ENCOUNTER — Other Ambulatory Visit: Payer: Self-pay | Admitting: Adult Health

## 2018-10-10 ENCOUNTER — Other Ambulatory Visit: Payer: Self-pay | Admitting: Interventional Cardiology

## 2018-10-29 IMAGING — DX DG CHEST 2V
2 series · 2 of 2 positions shown · non-contrast
Comparison: 02/25/2011

CLINICAL DATA: COPD exacerbation. Worsening shortness of breath for
2 weeks with left-sided chest pain and cough.

EXAM:
CHEST  2 VIEW

[chest lat]
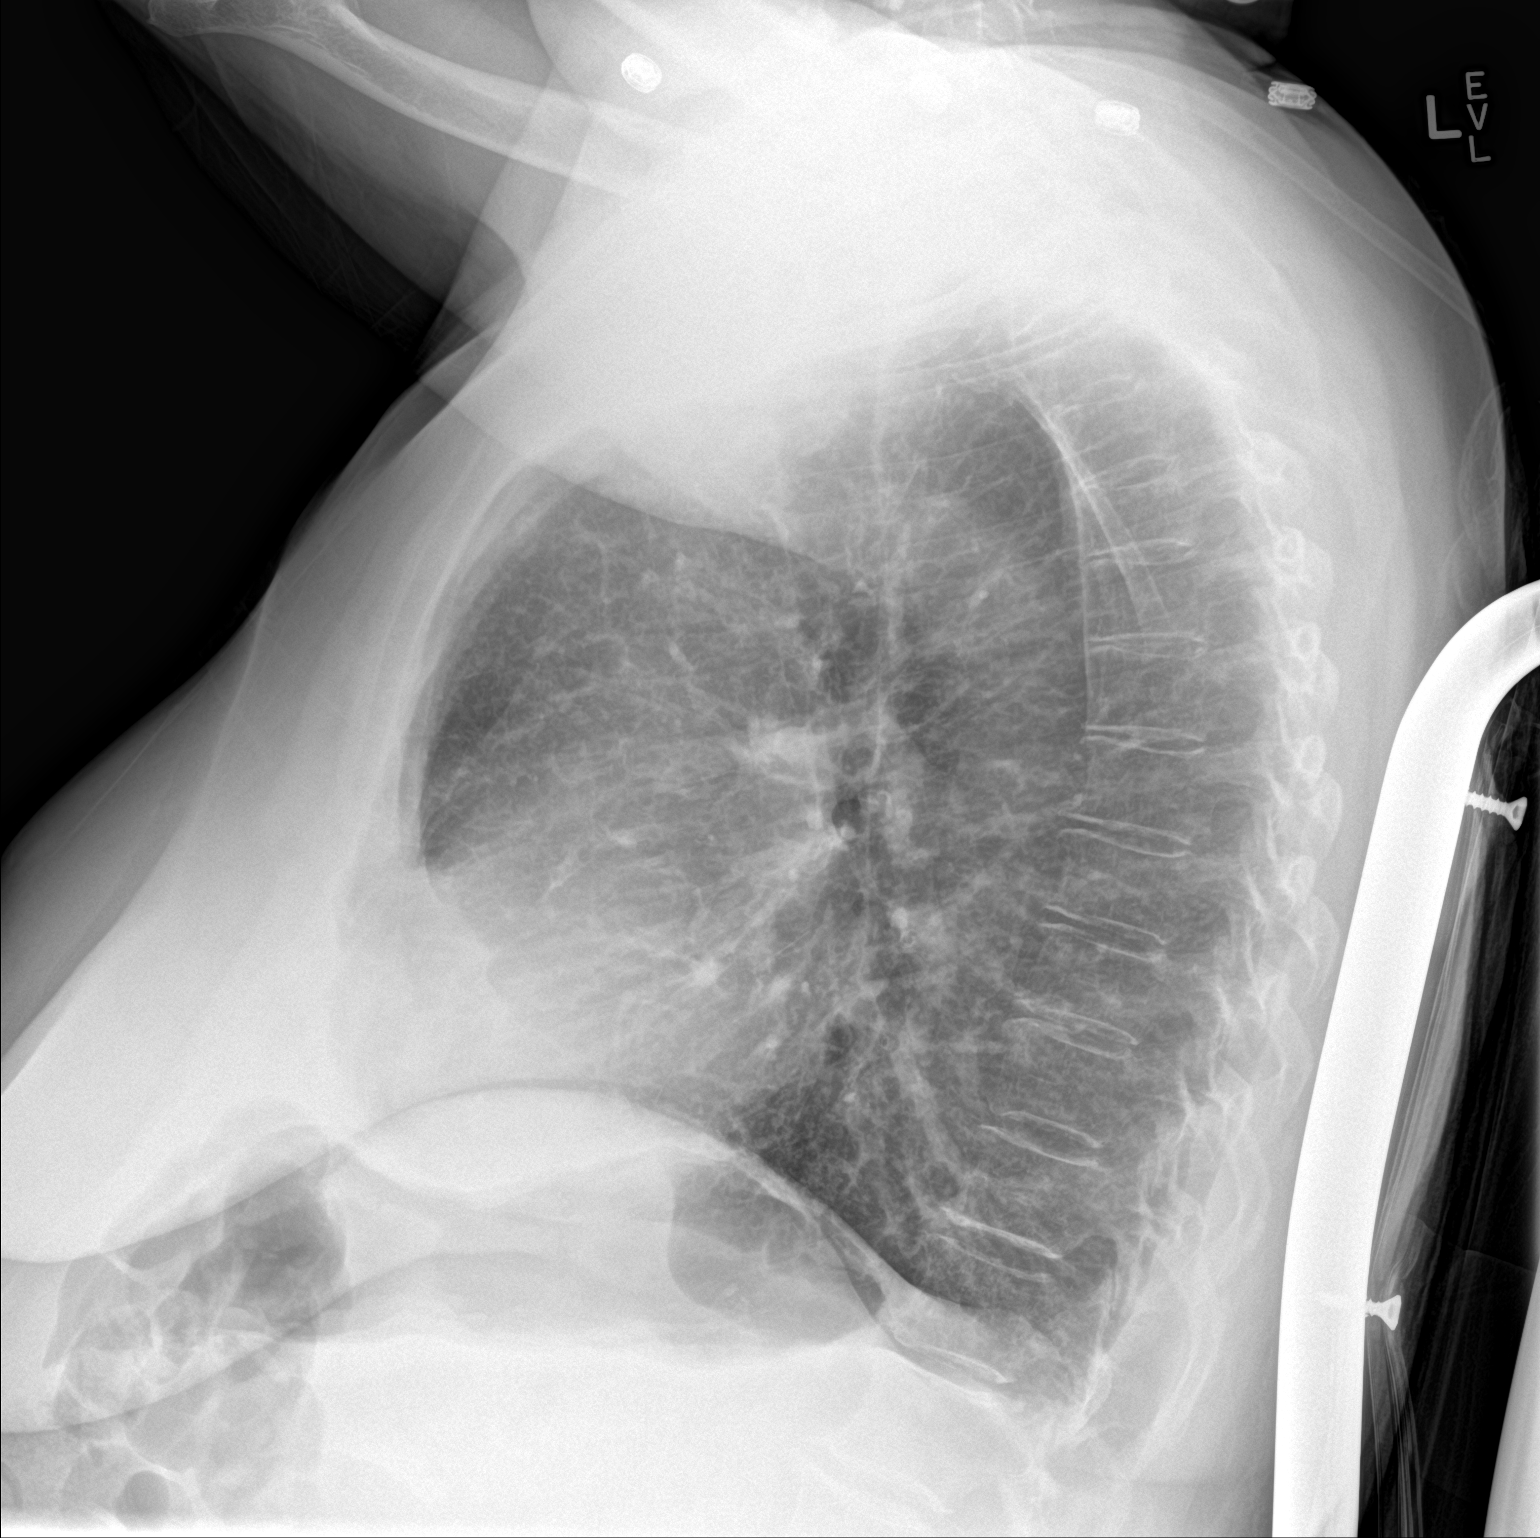

[chest ap]
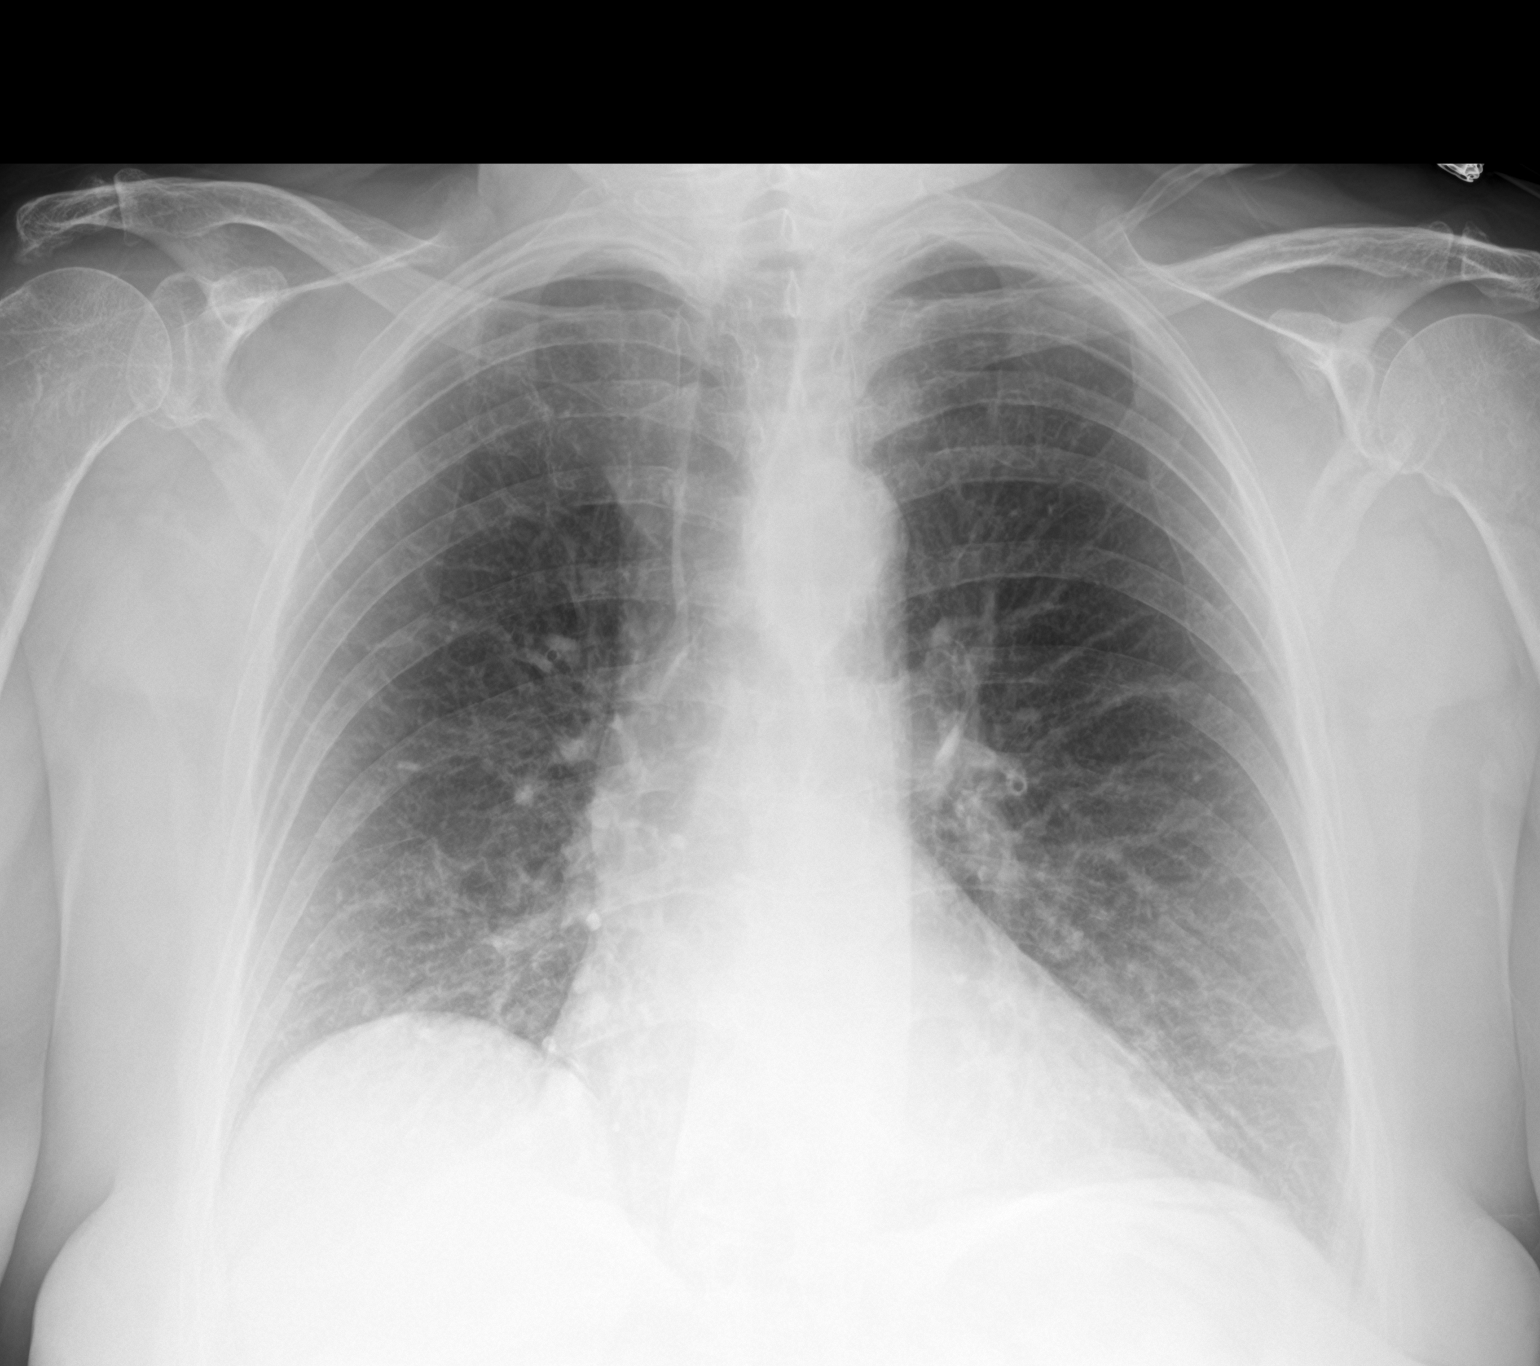

[2 of 2 positions shown; findings below may reference images not displayed]

FINDINGS: The cardiac silhouette is borderline enlarged, accentuated by AP
technique. Aortic atherosclerosis is noted. There is new mild
elevation of the right hemidiaphragm increased AP diameter of the
chest is again seen consistent with hyperinflation and underlying
COPD. Airway thickening and interstitial coarsening have increased
from the prior study. There is minimal scarring or atelectasis in
the left lung base. No lobar consolidation, edema, pleural effusion,
or pneumothorax is identified. No acute osseous abnormality is seen.
IMPRESSION: Increased bronchitic changes.

## 2018-10-29 IMAGING — DX DG CHEST 1V PORT
1 series · 1 of 1 positions shown · non-contrast
Comparison: Portable exam 6714 hours compared to 12/03/2015 at 4711
hours

CLINICAL DATA: Sudden onset of increased shortness of breath and
dyspnea, history hypertension, COPD, former smoker

EXAM:
PORTABLE CHEST 1 VIEW

[chest ap]
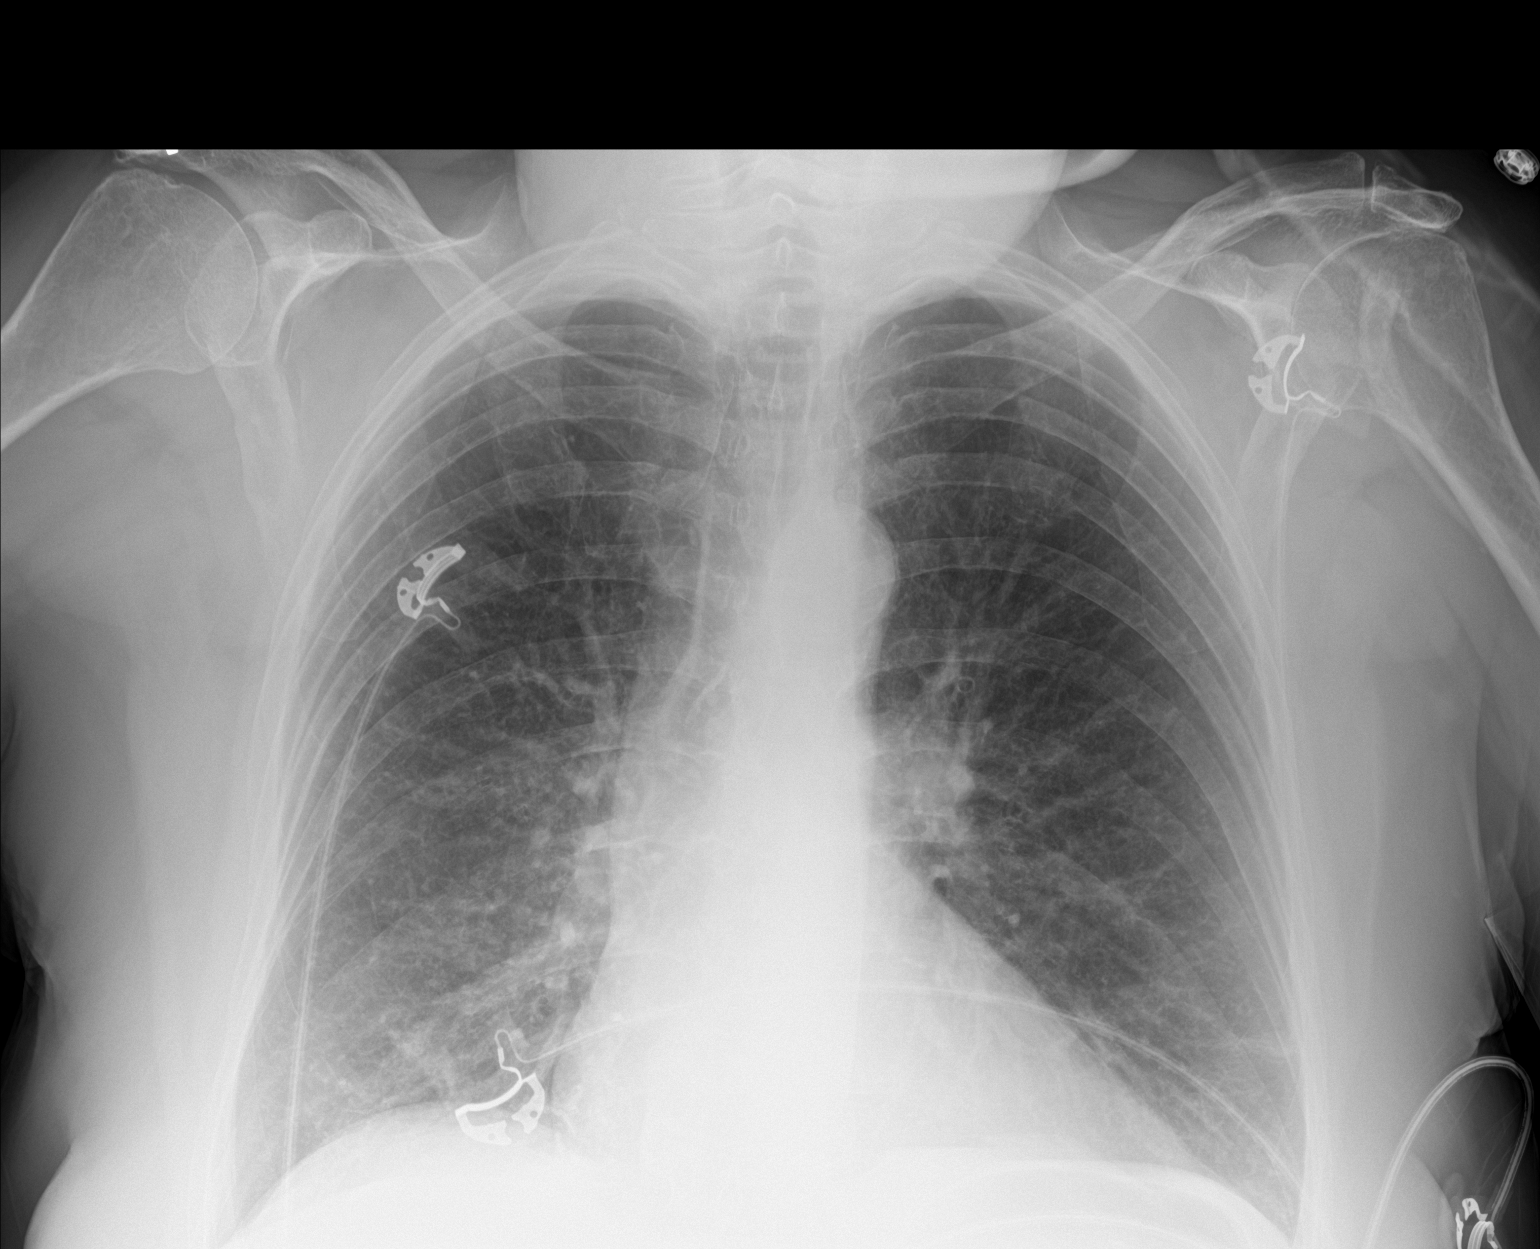

[1 of 1 positions shown; findings below may reference images not displayed]

FINDINGS: Normal heart size, mediastinal contours, and pulmonary vascularity.

Bronchitic changes with slight increase in RIGHT perihilar markings
since the earlier exam question developing pneumonia.

Linear subsegmental atelectasis LEFT base.

Remaining lungs clear.

No pleural effusion or pneumothorax.
IMPRESSION: Bronchitic changes with questionable RIGHT basilar infiltrate.

## 2018-11-06 ENCOUNTER — Other Ambulatory Visit: Payer: Self-pay | Admitting: Interventional Cardiology

## 2018-11-17 ENCOUNTER — Other Ambulatory Visit: Payer: Self-pay | Admitting: Primary Care

## 2018-11-17 DIAGNOSIS — J449 Chronic obstructive pulmonary disease, unspecified: Secondary | ICD-10-CM

## 2018-11-25 ENCOUNTER — Ambulatory Visit: Payer: 59 | Admitting: Primary Care

## 2018-11-25 ENCOUNTER — Encounter: Payer: Self-pay | Admitting: Adult Health

## 2018-11-25 ENCOUNTER — Other Ambulatory Visit: Payer: Self-pay

## 2018-11-25 ENCOUNTER — Ambulatory Visit (INDEPENDENT_AMBULATORY_CARE_PROVIDER_SITE_OTHER): Payer: 59 | Admitting: Adult Health

## 2018-11-25 DIAGNOSIS — Z23 Encounter for immunization: Secondary | ICD-10-CM

## 2018-11-25 DIAGNOSIS — J9611 Chronic respiratory failure with hypoxia: Secondary | ICD-10-CM | POA: Diagnosis not present

## 2018-11-25 DIAGNOSIS — J449 Chronic obstructive pulmonary disease, unspecified: Secondary | ICD-10-CM

## 2018-11-25 NOTE — Assessment & Plan Note (Signed)
COPD prior to recurrent exacerbations doing well on current regimen.  Patient is not had an exacerbation in a good while.  We will try to further decrease prednisone and slowly taper to off  Plan  Patient Instructions  Continue Stiolto 2 puffs once daily, rinse after use.  Continue Pulmicort nebulizer twice daily Continue Daliresp daily Decrease Prednisone 5mg  Twice weekly (Monday and Thursday ) for 1 month then weekly on Mondays for 1 month then stop.  Albuterol Inhaler or Neb as needed.  Flu shot today .  Follow up with Dr. Elsworth Soho  In 3 months and As needed

## 2018-11-25 NOTE — Progress Notes (Signed)
@Patient  ID: , female    DOB: 12/01/1950, 68 y.o.   MRN: [id number]  Chief Complaint  Patient presents with  . Follow-up    COPD    Referring provider: [id number]  HPI: 68 year old female former smoker (quit 2012) followed for moderate COPD, chronic rhinitis,  And chronic respiratory failure  Medical history is significant for hypertension and coronary artery disease  TEST/EVENTS :  03/23/16: FVC 2.28 L (80%),FEV1 1.49 L (69%), ratio 66 sig bronchodilator response NO DLCO 07/13/15: FVC 2.34 L (82%) FEV1 1.46 L (67%)ratio63sigbronchodilator response DLCO uncorrected 62%  High-resolution CT chest November 2019 showed no ILD.  Moderate emphysema.  Few scattered thin parenchymal bands at the lung bases  Testing: Alpha-1 antitrypsin: MM (169)   11/25/2018 Follow up: COPD, and O2 RF  Patient returns for a 65-month follow-up.  Patient has COPD and is prone to recurrent exacerbations.  She says since last visit she has been doing better.  She has not had any recent flare of cough or wheezing.  She does get short of breath with minimum activity.  She uses oxygen with activity on occasion.  And sleeps with oxygen at 2 L. She is on Stiolto 2 puffs daily.  Pulmicort nebulizer twice daily.  And Daliresp.  She is also on chronic prednisone 5 mg 3 days a week. Patient would like to get her flu shot today.  Appetite has been good.  No nausea vomiting or diarrhea.   No Known Allergies  Immunization History  Administered Date(s) Administered  . Fluad Quad(high Dose 65+) 11/25/2018  . Influenza Split 10/18/2015  . Influenza, High Dose Seasonal PF 10/24/2016  . Influenza,inj,Quad PF,6+ Mos 09/19/2017  . Pneumococcal Conjugate-13 01/30/2013  . Pneumococcal Polysaccharide-23 02/27/2011    Past Medical History:  Diagnosis Date  . Acute DVT (deep venous thrombosis) (HCC)   . Allergic rhinitis   . Atrophic vaginitis   . COPD (chronic obstructive pulmonary  disease) (HCC)   . Hypertension   . Low back pain   . Umbilical hernia     Tobacco History: Social History   Tobacco Use  Smoking Status Former Smoker  . Packs/day: 1.50  . Years: 46.00  . Pack years: 69.00  . Types: Cigarettes  . Start date: 01/31/1964  . Quit date: 01/30/2010  . Years since quitting: 8.8  Smokeless Tobacco Never Used   Counseling given: Not Answered   Outpatient Medications Prior to Visit  Medication Sig Dispense Refill  . acetaminophen (TYLENOL) 500 MG tablet Take 500 mg by mouth 2 (two) times daily as needed for moderate pain or headache.    . albuterol (PROVENTIL HFA;VENTOLIN HFA) 108 (90 Base) MCG/ACT inhaler Inhale 2 puffs into the lungs every 6 (six) hours as needed for wheezing or shortness of breath.     03/31/2010 albuterol (PROVENTIL) (2.5 MG/3ML) 0.083% nebulizer solution Take 2.5 mg by nebulization every 6 (six) hours as needed for wheezing or shortness of breath.    Marland Kitchen aspirin 81 MG tablet Take 81 mg by mouth daily.    Marland Kitchen atorvastatin (LIPITOR) 40 MG tablet Take 1 tablet (40 mg total) by mouth daily at 6 PM. 30 tablet 0  . Azelastine-Fluticasone 137-50 MCG/ACT SUSP Place 1 spray into the nose 2 (two) times daily. 1 Bottle 1  . budesonide (PULMICORT) 0.5 MG/2ML nebulizer solution Take 2 mLs (0.5 mg total) by nebulization 2 (two) times daily. 120 mL 6  . montelukast (SINGULAIR) 10 MG tablet TAKE 1 TABLET BY  MOUTH EVERYDAY AT BEDTIME 30 tablet 5  . nitroGLYCERIN (NITROSTAT) 0.4 MG SL tablet Place 1 tablet (0.4 mg total) under the tongue every 5 (five) minutes as needed for chest pain. 90 tablet 3  . Polyethyl Glycol-Propyl Glycol (SYSTANE OP) Apply 1-2 drops to eye daily as needed (dry eyes).    . predniSONE (DELTASONE) 10 MG tablet TAKE 1/2 TABLET BY MOUTH MONDAYS, WEDNESDAYS AND FRIDAYS 12 tablet 4  . roflumilast (DALIRESP) 500 MCG TABS tablet TAKE 1 TABLET (500 MCG TOTAL) BY MOUTH DAILY. 30 tablet 5  . Spacer/Aero-Holding Chambers (AEROCHAMBER MV) inhaler Use as  instructed 1 each 0  . STIOLTO RESPIMAT 2.5-2.5 MCG/ACT AERS INHALE 2 PUFFS BY MOUTH INTO THE LUNGS DAILY 4 g 3  . traMADol (ULTRAM) 50 MG tablet 1/2-1 tab every 6 hours prn pain 40 tablet 0   No facility-administered medications prior to visit.      Review of Systems:   Constitutional:   No  weight loss, night sweats,  Fevers, chills, + fatigue, or  lassitude.  HEENT:   No headaches,  Difficulty swallowing,  Tooth/dental problems, or  Sore throat,                No sneezing, itching, ear ache, nasal congestion, post nasal drip,   CV:  No chest pain,  Orthopnea, PND, swelling in lower extremities, anasarca, dizziness, palpitations, syncope.   GI  No heartburn, indigestion, abdominal pain, nausea, vomiting, diarrhea, change in bowel habits, loss of appetite, bloody stools.   Resp:   No chest wall deformity  Skin: no rash or lesions.  GU: no dysuria, change in color of urine, no urgency or frequency.  No flank pain, no hematuria   MS:  No joint pain or swelling.  No decreased range of motion.  No back pain.    Physical Exam  BP 138/76 (BP Location: Left Arm, Cuff Size: Normal)   Pulse 84   Temp (!) 97.4 F (36.3 C) (Oral)   Ht 5\' 2"  (1.575 m)   Wt 198 lb 12.8 oz (90.2 kg)   SpO2 96%   BMI 36.36 kg/m   GEN: A/Ox3; pleasant , NAD, obese per BMI   HEENT:  /AT,  EACs-clear, TMs-wnl, NOSE-clear, THROAT-clear, no lesions, no postnasal drip or exudate noted.   NECK:  Supple w/ fair ROM; no JVD; normal carotid impulses w/o bruits; no thyromegaly or nodules palpated; no lymphadenopathy.    RESP diminished breath sounds in the bases   no accessory muscle use, no dullness to percussion  CARD:  RRR, no m/r/g, no peripheral edema, pulses intact, no cyanosis or clubbing.  GI:   Soft & nt; nml bowel sounds; no organomegaly or masses detected.   Musco: Warm bil, no deformities or joint swelling noted.   Neuro: alert, no focal deficits noted.    Skin: Warm, no lesions or  rashes    Lab Results:  CBC  BMET  BNP No results found for: BNP  Imaging: No results found.    PFT Results Latest Ref Rng & Units 03/23/2016 07/13/2015  FVC-Pre L 1.90 2.09  FVC-Predicted Pre % 67 73  FVC-Post L 2.28 2.34  FVC-Predicted Post % 80 82  Pre FEV1/FVC % % 65 69  Post FEV1/FCV % % 66 63  FEV1-Pre L 1.23 1.45  FEV1-Predicted Pre % 57 66  FEV1-Post L 1.49 1.46  DLCO UNC% % - 62  DLCO COR %Predicted % - 69  TLC L - 5.56  TLC %  Predicted % - 117  RV % Predicted % - 153    No results found for: NITRICOXIDE      Assessment & Plan:   Moderate COPD (chronic obstructive pulmonary disease) (HCC) COPD prior to recurrent exacerbations doing well on current regimen.  Patient is not had an exacerbation in a good while.  We will try to further decrease prednisone and slowly taper to off  Plan  Patient Instructions  Continue Stiolto 2 puffs once daily, rinse after use.  Continue Pulmicort nebulizer twice daily Continue Daliresp daily Decrease Prednisone 5mg  Twice weekly (Monday and Thursday ) for 1 month then weekly on Mondays for 1 month then stop.  Albuterol Inhaler or Neb as needed.  Flu shot today .  Follow up with Dr. Elsworth Soho  In 3 months and As needed         Chronic respiratory failure with hypoxia ([place]) Continue on oxygen with activity and at bedtime at 2 L     Rexene Edison, NP 11/25/2018

## 2018-11-25 NOTE — Patient Instructions (Addendum)
Continue Stiolto 2 puffs once daily, rinse after use.  Continue Pulmicort nebulizer twice daily Continue Daliresp daily Decrease Prednisone 5mg  Twice weekly (Monday and Thursday ) for 1 month then weekly on Mondays for 1 month then stop.  Albuterol Inhaler or Neb as needed.  Flu shot today .  Follow up with Dr. Elsworth Soho  In 3 months and As needed

## 2018-11-25 NOTE — Assessment & Plan Note (Signed)
Continue on oxygen with activity and at bedtime at 2 L

## 2018-11-30 ENCOUNTER — Other Ambulatory Visit: Payer: Self-pay | Admitting: Interventional Cardiology

## 2019-01-18 ENCOUNTER — Other Ambulatory Visit: Payer: Self-pay | Admitting: Interventional Cardiology

## 2019-02-06 ENCOUNTER — Other Ambulatory Visit: Payer: Self-pay | Admitting: Primary Care

## 2019-02-06 DIAGNOSIS — J449 Chronic obstructive pulmonary disease, unspecified: Secondary | ICD-10-CM

## 2019-02-21 ENCOUNTER — Other Ambulatory Visit: Payer: Self-pay | Admitting: Interventional Cardiology

## 2019-02-27 ENCOUNTER — Ambulatory Visit (INDEPENDENT_AMBULATORY_CARE_PROVIDER_SITE_OTHER): Payer: 59 | Admitting: Pulmonary Disease

## 2019-02-27 ENCOUNTER — Other Ambulatory Visit: Payer: Self-pay

## 2019-02-27 ENCOUNTER — Ambulatory Visit: Payer: 59 | Admitting: Pulmonary Disease

## 2019-02-27 ENCOUNTER — Encounter: Payer: Self-pay | Admitting: Pulmonary Disease

## 2019-02-27 DIAGNOSIS — J449 Chronic obstructive pulmonary disease, unspecified: Secondary | ICD-10-CM

## 2019-02-27 DIAGNOSIS — J9611 Chronic respiratory failure with hypoxia: Secondary | ICD-10-CM | POA: Diagnosis not present

## 2019-02-27 NOTE — Assessment & Plan Note (Signed)
Stable, continue on oxygen 24/7

## 2019-02-27 NOTE — Assessment & Plan Note (Signed)
Continue current regimen of Stiolto and Pulmicort In the future, we will consider changing her to triple therapy combination Prednisone has been successfully tapered to off.  We discussed signs and symptoms of exacerbations and strategy of early treatment

## 2019-02-27 NOTE — Progress Notes (Signed)
I connected with  Erin Morrison on 02/27/19 by phone and verified that I am speaking with the correct person using two identifiers.   I discussed the limitations of evaluation and management by telemedicine. The patient expressed understanding and agreed to proceed.   Chief Complaint  Patient presents with  . Follow-up    Televisit- Pt states her breathing is doing well today. She is using her albuterol inhaler once per night on average. She has not used the albuterol nebs. She came off pred approx 1 month ago.     69 yo ex-smoker for follow-up of moderate COPD and chronic respiratory failure on O2 since 2017 smoker 1.5-2ppd x 30+ years (quit 2012 )   She is on Stiolto 2 puffs daily + Pulmicort nebulizer twice daily and Daliresp. Prednisone was tapered to off  She has had no interim flareups or hospitalizations. Last office visit 10/2018 reviewed  Significant tests/ events reviewed  HRCT 11/2017 moderate emphysema, mild scarring at lung bases, postinflammatory, no ILD  PFT 03/2016: FVC 2.28 L (80%),FEV1 1.49 L (69%), ratio 66 sig bronchodilator response NO DLCO 07/2015: FVC 2.34L (82%) FEV1 1.46L (67%)ratio63sigbronchodilator response DLCO uncorrected 62%  Alpha-1 antitrypsin: MM (169)       neg for any significant sore throat, dysphagia, itching, sneezing, nasal congestion or excess/ purulent secretions, fever, chills, sweats, unintended wt loss, pleuritic or exertional cp, hempoptysis, orthopnea pnd or change in chronic leg swelling. Also denies presyncope, palpitations, heartburn, abdominal pain, nausea, vomiting, diarrhea or change in bowel or urinary habits, dysuria,hematuria, rash, arthralgias, visual complaints, headache, numbness weakness or ataxia.    Exam-no audible shortness of breath or wheezing  Total encounter time x 21 m

## 2019-03-05 ENCOUNTER — Other Ambulatory Visit: Payer: Self-pay | Admitting: Primary Care

## 2019-03-08 ENCOUNTER — Other Ambulatory Visit: Payer: Self-pay | Admitting: Primary Care

## 2019-04-01 ENCOUNTER — Other Ambulatory Visit: Payer: Self-pay | Admitting: Primary Care

## 2019-04-01 DIAGNOSIS — J449 Chronic obstructive pulmonary disease, unspecified: Secondary | ICD-10-CM

## 2019-07-30 ENCOUNTER — Other Ambulatory Visit: Payer: Self-pay | Admitting: Primary Care

## 2019-07-30 DIAGNOSIS — J449 Chronic obstructive pulmonary disease, unspecified: Secondary | ICD-10-CM

## 2019-08-28 NOTE — Progress Notes (Signed)
@Patient  ID: Erin Morrison, female    DOB: 03/27/1950, 69 y.o.   MRN: [id number]  Chief Complaint  Patient presents with  . Follow-up    COPD    Referring provider: Arlyss QueenConroy, Nathan, PA-C  HPI: 69 year old female, former smoker 1.5-2ppd x 30+ years (quit 2012). PMH moderate COPD, chronic allergic rhinitis, elevated diaphragm, chronic resp failure, GERD, HTN, CAD, NSTEMI. Patient of Dr. Vassie LollAlva. She maintained on Stiolto + Pulmicort and Daliresp 500mcg.   Previous [place] encounter: 03/25/2018 Patient presents today for follow-up visit. Accompanied by younger female family member. She is feeling ok, no acute complaints. Ambulated halls to exam room without oxygen and turned on POC while sitting. Breathing is baseline, states that she can see a difference in her breathing with weaning off daily prednisone. Her main goal is to be able to get through normal day to day activities without feeling short of breath.   04/10/2018 Patient presents today for 2 week follow-up. Accompanied by family member. Feels her breathing is a little better. Pulmicort, Perforomist and [place] were stopped and she was switched over to Stiolto 2 puffs daily. Using less Albuterol; she was using nebulizer 3-4 times a day, now only requires 1-2 times a day. She has her oxygen with her today but did not need it while walking into visit. Uses as needed on exertion and at night. Continues prednisone 5mg  MWF. Previously did not tolerate decrease in daily prednisone dose. Complains of bilateral lower back pain and urinary frequency, checking urine culture.   08/05/2018 Patient presents today for 3-4 month follow-up. Daughter present on speaker phone. Reports that she has been having more shortness of breath since May. Unsure if it is weather related. Coughing up some yellow mucus. Some confusion whether or not she has been taking Stiolto because she reports that she did not notice a difference on it. Continues prednisone 5mg  daily,  Daliresp and Singulair as prescribed. She has not been very active, has been to pulmonary rehab in the past and not interested in returning. Denies fever, exposure to covid, flu-like illness. Wheezing throughout lung fields.Treated for COPD exacerbation with doxycycline and prednisone taper. Continues stiolto and asked to resume Pulmicort nebulizer.   08/21/2018 Patient called today for 2 week follow-up for COPD exacerbation. States that she is feeling much improved. Shortness of breath and cough have resolved. Using stiolto and Pulmicort nebulizer as prescribed. Needs refill daliresp.   02/27/19- Dr. Vassie LollAlva 69 yo ex-smoker for follow-up of moderate COPD and chronic respiratory failure on O2 since 2017 smoker 1.5-2ppd x 30+ years (quit 2012 ) She is on Stiolto 2 puffs daily +Pulmicort nebulizer twice daily and Daliresp. Prednisone was tapered to off She has had no interim flareups or hospitalizations. Last office visit 10/2018 reviewed   08/29/2019- Interim hx Patient presents today for 6 month follow-up. Doing fairly well, no recent exacerbations in the last 6 months or hospital stays. She is currently taking STIOLTO and Pulmicort. Husband reports this cost them $300 dollars a month. Recently change insurance plans. They received a letter stating that D[facility]. She has two weeks left of Daliresp 500mcg medication. She has noticed a big improvement in COPD symptoms since starting medication and she has had a decrease in COPD exacerbations and hospitalizations since being on it.    Significant tests/ events reviewed HRCT 11/2017 moderate emphysema, mild scarring at lung bases, postinflammatory, no ILD  PFT's: 03/23/16: FVC 2.28 L (80%),FEV1 1.49 L (  69%), ratio 66 sig bronchodilator response NO DLCO 07/13/15: FVC 2.34 L (82%) FEV1 1.46 L (67%)ratio63sigbronchodilator response DLCO uncorrected 62%  Testing: Alpha-1 antitrypsin: MM (169)    No  Known Allergies  Immunization History  Administered Date(s) Administered  . Fluad Quad(high Dose 65+) 11/25/2018  . Influenza Split 10/18/2015  . Influenza, High Dose Seasonal PF 10/24/2016  . Influenza,inj,Quad PF,6+ Mos 09/19/2017  . PFIZER SARS-COV-2 Vaccination 08/26/2019  . Pneumococcal Conjugate-13 01/30/2013  . Pneumococcal Polysaccharide-23 02/27/2011    Past Medical History:  Diagnosis Date  . Acute DVT (deep venous thrombosis) (HCC)   . Allergic rhinitis   . Atrophic vaginitis   . COPD (chronic obstructive pulmonary disease) (HCC)   . Hypertension   . Low back pain   . Umbilical hernia     Tobacco History: Social History   Tobacco Use  Smoking Status Former Smoker  . Packs/day: 1.50  . Years: 46.00  . Pack years: 69.00  . Types: Cigarettes  . Start date: 01/31/1964  . Quit date: 01/30/2010  . Years since quitting: 9.5  Smokeless Tobacco Never Used   Counseling given: Not Answered   Outpatient Medications Prior to Visit  Medication Sig Dispense Refill  . acetaminophen (TYLENOL) 500 MG tablet Take 500 mg by mouth 2 (two) times daily as needed for moderate pain or headache.    . albuterol (PROVENTIL) (2.5 MG/3ML) 0.083% nebulizer solution Take 2.5 mg by nebulization every 6 (six) hours as needed for wheezing or shortness of breath.    Marland Kitchen aspirin 81 MG tablet Take 81 mg by mouth daily.    Marland Kitchen atorvastatin (LIPITOR) 40 MG tablet TAKE 1 TABLET BY MOUTH EVERY DAY AT 6PM 15 tablet 0  . Azelastine-Fluticasone 137-50 MCG/ACT SUSP Place 1 spray into the nose 2 (two) times daily. 1 Bottle 1  . budesonide (PULMICORT) 0.5 MG/2ML nebulizer solution TAKE 2 MLS (0.5 MG TOTAL) BY NEBULIZATION 2 (TWO) TIMES DAILY. 120 mL 6  . montelukast (SINGULAIR) 10 MG tablet TAKE 1 TABLET BY MOUTH EVERYDAY AT BEDTIME 30 tablet 5  . Polyethyl Glycol-Propyl Glycol (SYSTANE OP) Apply 1-2 drops to eye daily as needed (dry eyes).    . roflumilast (DALIRESP) 500 MCG TABS tablet TAKE 1 TABLET BY MOUTH  EVERY DAY 30 tablet 3  . Spacer/Aero-Holding Chambers (AEROCHAMBER MV) inhaler Use as instructed 1 each 0  . STIOLTO RESPIMAT 2.5-2.5 MCG/ACT AERS INHALE 2 PUFFS BY MOUTH INTO THE LUNGS DAILY 4 g 3  . traMADol (ULTRAM) 50 MG tablet 1/2-1 tab every 6 hours prn pain 40 tablet 0  . albuterol (PROVENTIL HFA;VENTOLIN HFA) 108 (90 Base) MCG/ACT inhaler Inhale 2 puffs into the lungs every 6 (six) hours as needed for wheezing or shortness of breath.     . nitroGLYCERIN (NITROSTAT) 0.4 MG SL tablet Place 1 tablet (0.4 mg total) under the tongue every 5 (five) minutes as needed for chest pain. 90 tablet 3   No facility-administered medications prior to visit.    Review of Systems  Review of Systems  Constitutional: Negative.   Respiratory: Positive for shortness of breath. Negative for cough, chest tightness and wheezing.    Physical Exam  BP (!) 130/70 (BP Location: Left Arm, Cuff Size: Normal)   Pulse 74   Temp (!) 97.3 F (36.3 C) (Oral)   Ht 5\' 2"  (1.575 m)   Wt (!) 205 lb (93 kg)   SpO2 94%   BMI 37.49 kg/m  Physical Exam Constitutional:      Appearance:  Normal appearance. She is not ill-appearing.  HENT:     Head: Normocephalic and atraumatic.     Mouth/Throat:     Mouth: Mucous membranes are moist.     Pharynx: Oropharynx is clear.  Cardiovascular:     Rate and Rhythm: Normal rate and regular rhythm.  Pulmonary:     Effort: Pulmonary effort is normal.     Breath sounds: Normal breath sounds.     Comments: CTA; O2 2L pulsed  Skin:    General: Skin is warm and dry.  Neurological:     General: No focal deficit present.     Mental Status: She is oriented to person, place, and time. Mental status is at baseline.  Psychiatric:        Mood and Affect: Mood normal.        Behavior: Behavior normal.        Thought Content: Thought content normal.        Judgment: Judgment normal.      Lab Results:  CBC    Component Value Date/Time   WBC 11.6 (H) 04/18/2017 1048   WBC  13.5 (H) 12/08/2015 0330   RBC 4.69 04/18/2017 1048   RBC 4.98 12/08/2015 0330   HGB 14.0 04/18/2017 1048   HCT 42.0 04/18/2017 1048   PLT 319 04/18/2017 1048   MCV 90 04/18/2017 1048   MCV 94 02/26/2011 0424   MCH 29.9 04/18/2017 1048   MCH 30.3 12/08/2015 0330   MCHC 33.3 04/18/2017 1048   MCHC 32.5 12/08/2015 0330   RDW 13.1 04/18/2017 1048   RDW 13.5 02/26/2011 0424   LYMPHSABS 1.6 02/24/2016 1230   LYMPHSABS 0.6 (L) 02/26/2011 0424   MONOABS 0.4 12/04/2015 0511   MONOABS 0.1 02/26/2011 0424   EOSABS 0.1 02/24/2016 1230   EOSABS 0.0 02/26/2011 0424   BASOSABS 0.0 02/24/2016 1230   BASOSABS 0.0 02/26/2011 0424    BMET    Component Value Date/Time   NA 141 04/18/2017 1048   NA 146 (H) 02/26/2011 0424   K 4.5 04/18/2017 1048   K 3.7 02/26/2011 0424   CL 104 04/18/2017 1048   CL 112 (H) 02/26/2011 0424   CO2 21 04/18/2017 1048   CO2 23 02/26/2011 0424   GLUCOSE 123 (H) 04/18/2017 1048   GLUCOSE 147 (H) 12/08/2015 0330   GLUCOSE 137 (H) 02/26/2011 0424   BUN 13 04/18/2017 1048   BUN 12 02/26/2011 0424   CREATININE 0.69 04/18/2017 1048   CREATININE 0.58 (L) 02/26/2011 0424   CALCIUM 9.6 04/18/2017 1048   CALCIUM 9.3 02/26/2011 0424   GFRNONAA 90 04/18/2017 1048   GFRNONAA >60 02/26/2011 0424   GFRAA 103 04/18/2017 1048   GFRAA >60 02/26/2011 0424    BNP No results found for: BNP  ProBNP    Component Value Date/Time   PROBNP 45 04/18/2017 1048    Imaging: No results found.   Assessment & Plan:   Moderate COPD (chronic obstructive pulmonary disease) (HCC) - Stable interval; No recent exacerbations in the last 6 months - Continue Stiolto respimat 2 puffs twice daily ( we will check with pharmacist if there is a cheaper alternative on insurance plan) - Continue Pulmicort nebulizer twice daily - Needs prior authorization for Daliresp re: COPD moderate - Orders: Due for repeat full PFTs (last done in 2018 and clinical symptoms have worsened in the  last 3 years) - Follow-up 6 months with Dr. Vassie Loll or sooner if you develop worsening respiratory symptoms  Glenford Bayley, NP 08/29/2019

## 2019-08-29 ENCOUNTER — Encounter: Payer: Self-pay | Admitting: Primary Care

## 2019-08-29 ENCOUNTER — Other Ambulatory Visit: Payer: Self-pay | Admitting: Primary Care

## 2019-08-29 ENCOUNTER — Telehealth: Payer: Self-pay | Admitting: Primary Care

## 2019-08-29 ENCOUNTER — Ambulatory Visit: Payer: Medicare Other | Admitting: Primary Care

## 2019-08-29 ENCOUNTER — Other Ambulatory Visit: Payer: Self-pay

## 2019-08-29 VITALS — BP 130/70 | HR 74 | Temp 97.3°F | Ht 62.0 in | Wt 205.0 lb

## 2019-08-29 DIAGNOSIS — J449 Chronic obstructive pulmonary disease, unspecified: Secondary | ICD-10-CM | POA: Diagnosis not present

## 2019-08-29 MED ORDER — ALBUTEROL SULFATE HFA 108 (90 BASE) MCG/ACT IN AERS: 2.0000 | INHALATION_SPRAY | Freq: Four times a day (QID) | RESPIRATORY_TRACT | 1 refills | Status: DC | PRN

## 2019-08-29 NOTE — Telephone Encounter (Signed)
Can you do benefits investigation for LABA/LAMA or one of the triple therapy inhalers? Stiolto +pulmicort is costing them 300 dollars   We also started PA for daliresp 

## 2019-08-29 NOTE — Telephone Encounter (Signed)
Thanks Rachael!

## 2019-08-29 NOTE — Assessment & Plan Note (Addendum)
-   Stable interval; No recent exacerbations in the last 6 months - Continue Stiolto respimat 2 puffs once daily ( we will check with pharmacist if there is a cheaper alternative on insurance plan) - Continue Pulmicort nebulizer twice daily - Needs prior authorization for Daliresp re: COPD moderate - Orders: Due for repeat full PFTs (last done in 2018 and clinical symptoms have worsened in the last 3 years) - Follow-up 6 months with Dr. Vassie Loll or sooner if you develop worsening respiratory symptoms

## 2019-08-29 NOTE — Patient Instructions (Addendum)
Recommendations: - Continue Stiolto respimat 2 puffs once daily ( I will check with pharmacist if there is a cheaper inhaler on your plan) - Continue Pulmicort nebulizer twice daily  Orders: - Needs prior authorization for Daliresp re: COPD moderate - Full PFTs in 6 months  Follow-up: - 6 months with Dr. Vassie Loll or sooner if you develop worsening respiratory symptoms

## 2019-08-29 NOTE — Telephone Encounter (Signed)
Ran 1 month supply for : Stiolto, Anoro, Bevespi, Trelegy and Breztri: All have a $47.00 copay. ° °Ran test claim for Budesonide 0.5mg/2ml- 120ml for 30 days- copay is $29.84 ° °Patient could have previously had a deductible that has now been met. Per [employer] Medicare website, 2021 deductible was about $445 for most plans.

## 2019-08-29 NOTE — Telephone Encounter (Signed)
NEEDS PA started tried but something was going on with insurance will attempt again Monday august 2

## 2019-11-03 ENCOUNTER — Other Ambulatory Visit: Payer: Self-pay | Admitting: Pulmonary Disease

## 2019-11-28 IMAGING — DX DG CHEST 2V
2 series · 2 of 2 positions shown · non-contrast
Comparison: Chest x-ray of 01/13/2016

CLINICAL DATA: Former smoking history, history of COPD

EXAM:
CHEST  2 VIEW

[chest pa]
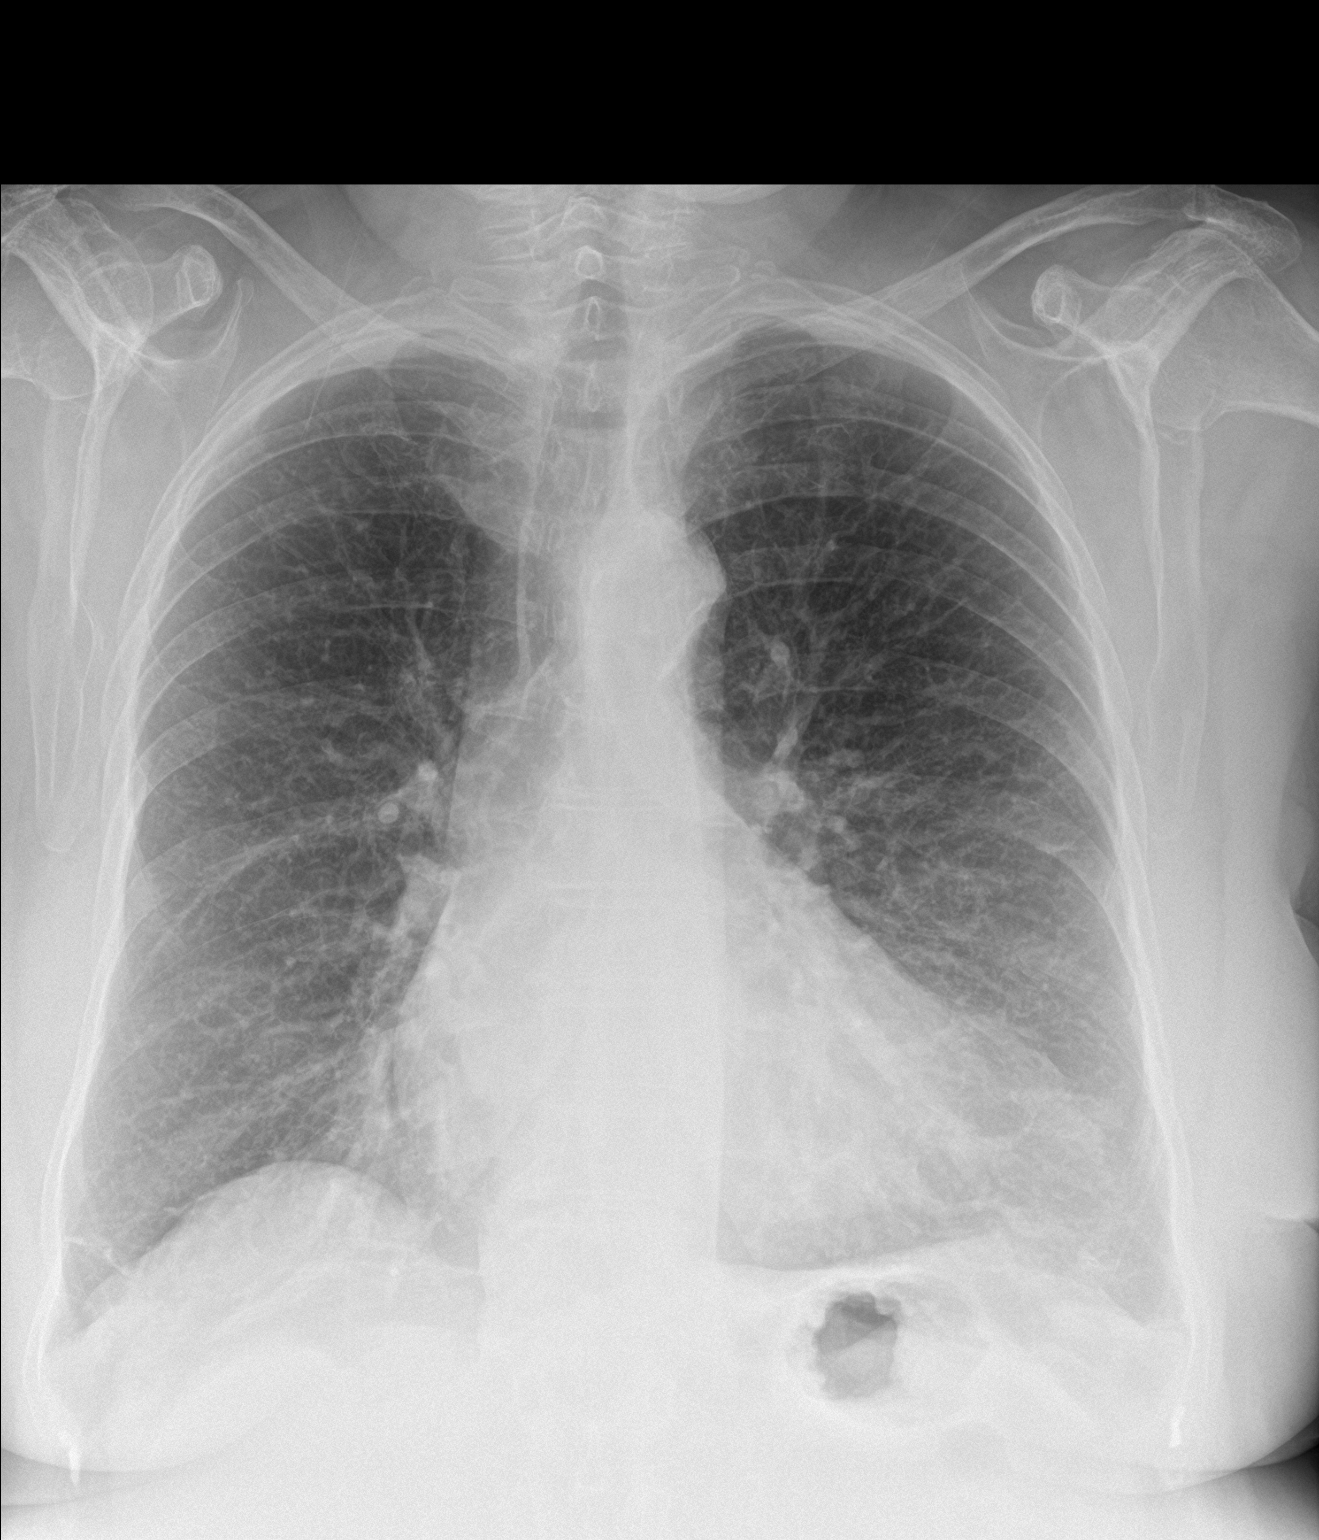

[chest lat]
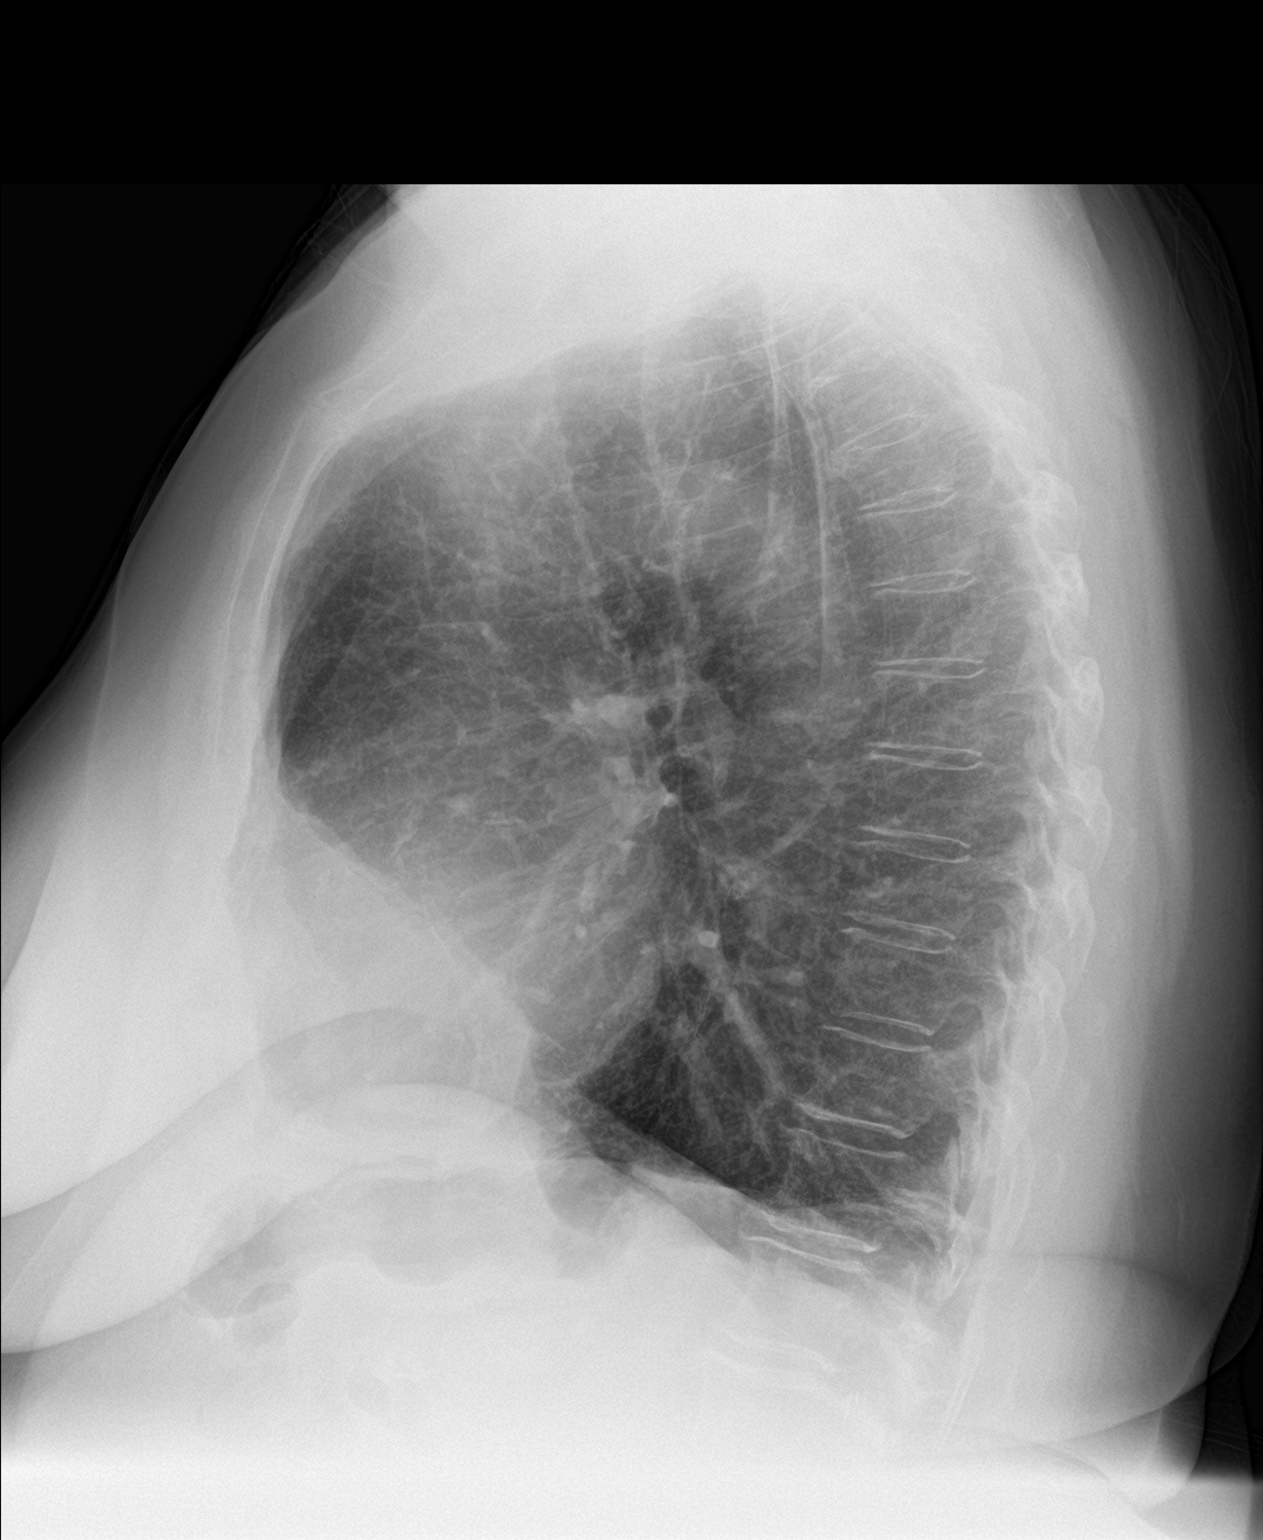

[2 of 2 positions shown; findings below may reference images not displayed]

FINDINGS: Linear atelectasis or scarring is noted at the lung bases. No
pneumonia or effusion is seen. The lungs remain somewhat
hyperaerated. Mediastinal and hilar contours are unremarkable and
the heart is within upper limits of normal. No bony abnormality is
seen.
IMPRESSION: 1. No change in hyper aeration of the lungs. No definite active
process.
2. Bibasilar linear atelectasis or scarring.

## 2019-11-29 ENCOUNTER — Other Ambulatory Visit: Payer: Self-pay | Admitting: Primary Care

## 2019-11-29 DIAGNOSIS — J449 Chronic obstructive pulmonary disease, unspecified: Secondary | ICD-10-CM

## 2020-02-25 ENCOUNTER — Telehealth: Payer: Self-pay | Admitting: Pulmonary Disease

## 2020-02-25 DIAGNOSIS — J449 Chronic obstructive pulmonary disease, unspecified: Secondary | ICD-10-CM

## 2020-02-25 MED ORDER — DALIRESP 500 MCG PO TABS: 500.0000 ug | ORAL_TABLET | Freq: Every day | ORAL | 5 refills | Status: DC

## 2020-02-25 NOTE — Telephone Encounter (Signed)
Refill has been sent to pharmacy.  

## 2020-03-18 ENCOUNTER — Other Ambulatory Visit: Payer: Self-pay | Admitting: Primary Care

## 2020-03-18 DIAGNOSIS — J449 Chronic obstructive pulmonary disease, unspecified: Secondary | ICD-10-CM

## 2020-03-25 ENCOUNTER — Encounter: Payer: Self-pay | Admitting: Pulmonary Disease

## 2020-03-25 ENCOUNTER — Ambulatory Visit: Payer: Medicare Other | Admitting: Pulmonary Disease

## 2020-03-25 ENCOUNTER — Ambulatory Visit (INDEPENDENT_AMBULATORY_CARE_PROVIDER_SITE_OTHER): Payer: Medicare Other | Admitting: Pulmonary Disease

## 2020-03-25 ENCOUNTER — Other Ambulatory Visit: Payer: Self-pay

## 2020-03-25 DIAGNOSIS — J449 Chronic obstructive pulmonary disease, unspecified: Secondary | ICD-10-CM

## 2020-03-25 DIAGNOSIS — J9611 Chronic respiratory failure with hypoxia: Secondary | ICD-10-CM | POA: Diagnosis not present

## 2020-03-25 LAB — PULMONARY FUNCTION TEST
DL/VA % pred: 93 %
DL/VA: 3.91 ml/min/mmHg/L
DLCO cor % pred: 83 %
DLCO cor: 15.1 ml/min/mmHg
DLCO unc % pred: 83 %
DLCO unc: 15.1 ml/min/mmHg
FEF 25-75 Post: 0.81 L/sec
FEF 25-75 Pre: 0.68 L/sec
FEF2575-%Change-Post: 19 %
FEF2575-%Pred-Post: 46 %
FEF2575-%Pred-Pre: 39 %
FEV1-%Change-Post: 6 %
FEV1-%Pred-Post: 69 %
FEV1-%Pred-Pre: 65 %
FEV1-Post: 1.43 L
FEV1-Pre: 1.34 L
FEV1FVC-%Change-Post: 1 %
FEV1FVC-%Pred-Pre: 82 %
FEV6-%Change-Post: 3 %
FEV6-%Pred-Post: 85 %
FEV6-%Pred-Pre: 82 %
FEV6-Post: 2.21 L
FEV6-Pre: 2.13 L
FEV6FVC-%Change-Post: -1 %
FEV6FVC-%Pred-Post: 103 %
FEV6FVC-%Pred-Pre: 104 %
FVC-%Change-Post: 4 %
FVC-%Pred-Post: 81 %
FVC-%Pred-Pre: 78 %
FVC-Post: 2.23 L
FVC-Pre: 2.13 L
Post FEV1/FVC ratio: 64 %
Post FEV6/FVC ratio: 99 %
Pre FEV1/FVC ratio: 63 %
Pre FEV6/FVC Ratio: 100 %
RV % pred: 122 %
RV: 2.59 L
TLC % pred: 100 %
TLC: 4.76 L

## 2020-03-25 MED ORDER — BREZTRI AEROSPHERE 160-9-4.8 MCG/ACT IN AERO: 2.0000 | INHALATION_SPRAY | Freq: Two times a day (BID) | RESPIRATORY_TRACT | 0 refills | Status: DC

## 2020-03-25 NOTE — Patient Instructions (Signed)
Full Pulmonary function test performed today. 

## 2020-03-25 NOTE — Assessment & Plan Note (Signed)
Her dyspnea is quite out of proportion  to her lung function No evidence of cardiac disease previous cardiac cath did not show significant CAD. She has gained at least 20 pounds from 190 to her current weight of 210 pounds over the last 2 years and admits to sedentary lifestyle so I does suspect a lot of deconditioning going on.  She lives far away from [facility] and would not want to join the pulmonary rehab program there, I encouraged her to start an exercise program on her own.  She does have a stationary bike which she will try to use

## 2020-03-25 NOTE — Assessment & Plan Note (Signed)
Lung function is stable compared to 2018, lung capacity is at 69%  Sample of Breztri -take 2 puffs twice daily, rinse mouth after use. This will take the place of Stiolto and Pulmicort , let me know if this works and we can send in a prescription.

## 2020-03-25 NOTE — Patient Instructions (Signed)
  Lung function is stable compared to 2018, lung capacity is at 69%  Sample of Breztri -take 2 puffs twice daily, rinse mouth after use. This will take the place of Stiolto and Pulmicort , let me know if this works and we can send in a prescription.  Ambulatory saturation to check oxygen level  You have significant deconditioning and weight gain -start on an exercise program

## 2020-03-25 NOTE — Progress Notes (Signed)
   Subjective:    Patient ID: Erin Morrison, female    DOB: 12/26/1950, 70 y.o.   MRN: [id number]  HPI  70 yo ex-smoker for follow-up of moderate COPD and chronic respiratory failure on O2 since 2017 smoker 1.5-2ppd x 30+ years (quit 2012 )  PMH -noncritical CAD She is on Stiolto 2 puffs daily +Pulmicort nebulizer twice daily and Daliresp. Prednisone was tapered to off She continues to complain of significant shortness of breath.  She has a sedentary lifestyle, she is short of breath walking around the house.  She is using oxygen 24/7 She has gained 20 pounds from 190 in 2019 to her current weight of 210 pounds  Denies pedal edema, orthopnea or proximal nocturnal dyspnea.  No hospitalization in the past year  Oxygen saturation dropped to 87% on first lab and recovered to 92% on 2 L pulse  Significant tests/ events reviewed  HRCT 11/2017 moderate emphysema, mild scarring at lung bases, postinflammatory, no ILD  PFTs 03/2020 ratio 63, FEV1 65%, TLC 100%, DLCO 83% -Stable compared to 2018  PFT02/2018: FVC 2.28 L (80%),FEV1 1.49 L (69%), ratio 66 sig bronchodilator response NO DLCO 07/2015: FVC 2.34L (82%) FEV1 1.46L (67%)ratio63sigbronchodilator response DLCO uncorrected 62%  Alpha-1 antitrypsin: MM (169)  Review of Systems neg for any significant sore throat, dysphagia, itching, sneezing, nasal congestion or excess/ purulent secretions, fever, chills, sweats, unintended wt loss, pleuritic or exertional cp, hempoptysis, orthopnea pnd or change in chronic leg swelling. Also denies presyncope, palpitations, heartburn, abdominal pain, nausea, vomiting, diarrhea or change in bowel or urinary habits, dysuria,hematuria, rash, arthralgias, visual complaints, headache, numbness weakness or ataxia.     Objective:   Physical Exam  Gen. Pleasant, obese, elderly,in no distress ENT - no lesions, no post nasal drip Neck: No JVD, no thyromegaly, no carotid bruits Lungs: no use  of accessory muscles, no dullness to percussion, decreased without rales or rhonchi  Cardiovascular: Rhythm regular, heart sounds  normal, no murmurs or gallops, no peripheral edema Musculoskeletal: No deformities, no cyanosis or clubbing , no tremors       Assessment & Plan:

## 2020-03-25 NOTE — Progress Notes (Signed)
Full PFT performed in lobby.

## 2020-04-17 ENCOUNTER — Other Ambulatory Visit: Payer: Self-pay | Admitting: Pulmonary Disease

## 2020-06-23 ENCOUNTER — Other Ambulatory Visit: Payer: Self-pay | Admitting: Primary Care

## 2020-07-21 ENCOUNTER — Other Ambulatory Visit: Payer: Self-pay | Admitting: Primary Care

## 2020-07-21 DIAGNOSIS — J449 Chronic obstructive pulmonary disease, unspecified: Secondary | ICD-10-CM

## 2020-07-23 ENCOUNTER — Ambulatory Visit: Payer: Medicare Other | Admitting: Primary Care

## 2020-07-23 ENCOUNTER — Other Ambulatory Visit: Payer: Self-pay

## 2020-07-23 ENCOUNTER — Encounter: Payer: Self-pay | Admitting: Primary Care

## 2020-07-23 DIAGNOSIS — Z01811 Encounter for preprocedural respiratory examination: Secondary | ICD-10-CM

## 2020-07-23 DIAGNOSIS — J9611 Chronic respiratory failure with hypoxia: Secondary | ICD-10-CM | POA: Diagnosis not present

## 2020-07-23 DIAGNOSIS — J449 Chronic obstructive pulmonary disease, unspecified: Secondary | ICD-10-CM

## 2020-07-23 MED ORDER — BREZTRI AEROSPHERE 160-9-4.8 MCG/ACT IN AERO: 2.0000 | INHALATION_SPRAY | Freq: Two times a day (BID) | RESPIRATORY_TRACT | 3 refills | Status: DC

## 2020-07-23 MED ORDER — BREZTRI AEROSPHERE 160-9-4.8 MCG/ACT IN AERO: 2.0000 | INHALATION_SPRAY | Freq: Two times a day (BID) | RESPIRATORY_TRACT | 0 refills | Status: DC

## 2020-07-23 NOTE — Patient Instructions (Addendum)
You will be considered intermediate risk for surgery d/t hx COPD and respiratory failure   Surgical recommendations: Short duration of surgery as much as possible and avoid paralytic if possible 2.   Recovery in step down or ICU with Pulmonary consultation if needed  3.   DVT prophylaxis 4.  Aggressive pulmonary toilet with o2, bronchodilatation, and incentive spirometry and early ambulation   COPD:  - Trial Breztri 2 puffs morning and evening (hold Stiolto +pulmicort while taking) - Use albuterol rescue inhaler 2 puffs every 6 hours as needed for breakthrough shortness of breath/wheezing - Continue Daliresp daily  Chronic respiratory failure: - Continue to wear oxygen, titrate between 2-4L to maintained O2 >88-90%  Follow-up:  - 4 week video or televisit with [facility] NP to assess response to new inhaler  - 4 months with Dr. Vassie Loll or sooner if needed

## 2020-07-23 NOTE — Progress Notes (Signed)
@Patient  ID: , female    DOB: 05/14/1950, 70 y.o.   MRN: [id number]  Chief Complaint  Patient presents with   Follow-up    Sob with activity.     Referring provider: [id number], Lonie Peak  HPI: 70 year old female, former smoker 1.5-2ppd x 30+ years (quit 2012). PMH moderate COPD, chronic allergic rhinitis, elevated diaphragm, chronic resp failure, GERD, HTN, CAD, NSTEMI. Patient of Dr. 2013. She maintained on Stiolto + Pulmicort and Daliresp Vassie Loll.   Previous [place] encounter: 7/30/20218/01/2019, NP Patient presents today for 6 month follow-up. Doing fairly well, no recent exacerbations in the last 6 months or hospital stays. She is currently taking STIOLTO and Pulmicort. Husband reports this cost them $300 dollars a month. Recently change insurance plans. They received a letter stating that Daliresp will not be covered by [facility] medicare advantage. She has two weeks left of Daliresp [facility] medication. She has noticed a big improvement in COPD symptoms since starting medication and she has had a decrease in COPD exacerbations and hospitalizations since being on it.   03/25/20- Dr. 03/27/20 She is on Stiolto 2 puffs daily + Pulmicort nebulizer twice daily and Daliresp. Prednisone was tapered to off She continues to complain of significant shortness of breath.  She has a sedentary lifestyle, she is short of breath walking around the house.  She is using oxygen 24/7 She has gained 20 pounds from 190 in 2019 to her current weight of 210 pounds  Denies pedal edema, orthopnea or proximal nocturnal dyspnea.  No hospitalization in the past year   Oxygen saturation dropped to 87% on first lab and recovered to 92% on 2 L pulse    07/23/2020- Interim hx  Patient presents today for 4 month follow-up. She is at her baseline, no acute respiratory complaints today. No significant changes since last visit. She continues to experience moderate dyspnea with activity. She is using stationary bike  and walking. She is also working on weight loss. She is currently on Stiolto + Pulmicort but is wondering if she should try Breztri that Dr. 07/25/2020 had previously recommended. She continues to take Daliresp. She is on 2-4L oxygen 24/7; she has had no new O2 requirements. She is having consult for hernia repair in the upcoming week. Denies cough, chest congestion, chest tightness or wheezing. CAT score 13.   Significant tests/ events reviewed   HRCT 11/2017 moderate emphysema, mild scarring at lung bases, postinflammatory, no ILD   PFTs 03/2020 ratio 63, FEV1 65%, TLC 100%, DLCO 83% -Stable compared to 2018  PFT 03/2016: FVC 2.28 L (80%),FEV1 1.49 L (69%), ratio 66 sig bronchodilator response NO DLCO 07/2015: FVC 2.34 L (82%) FEV1 1.46 L (67%) ratio 63 sig bronchodilator response DLCO uncorrected 62%   Alpha-1 antitrypsin: MM (169)   No Known Allergies  Immunization History  Administered Date(s) Administered   Fluad Quad(high Dose 65+) 11/25/2018   Influenza Split 10/18/2015   Influenza, High Dose Seasonal PF 10/24/2016   Influenza,inj,Quad PF,6+ Mos 09/19/2017   PFIZER(Purple Top)SARS-COV-2 Vaccination 08/26/2019, 09/16/2019   Pneumococcal Conjugate-13 01/30/2013   Pneumococcal Polysaccharide-23 02/27/2011    Past Medical History:  Diagnosis Date   Acute DVT (deep venous thrombosis) (HCC)    Allergic rhinitis    Atrophic vaginitis    COPD (chronic obstructive pulmonary disease) (HCC)    Hypertension    Low back pain    Umbilical hernia     Tobacco History: Social History   Tobacco Use  Smoking Status  Former   Packs/day: 1.50   Years: 46.00   Pack years: 69.00   Types: Cigarettes   Start date: 01/31/1964   Quit date: 01/30/2010   Years since quitting: 10.5  Smokeless Tobacco Never   Counseling given: Not Answered   Outpatient Medications Prior to Visit  Medication Sig Dispense Refill   acetaminophen (TYLENOL) 500 MG tablet Take 500 mg by mouth 2 (two) times daily as  needed for moderate pain or headache.     albuterol (PROVENTIL) (2.5 MG/3ML) 0.083% nebulizer solution Take 2.5 mg by nebulization every 6 (six) hours as needed for wheezing or shortness of breath.     albuterol (VENTOLIN HFA) 108 (90 Base) MCG/ACT inhaler Inhale 2 puffs into the lungs every 6 (six) hours as needed for wheezing or shortness of breath. 18 g 1   aspirin 81 MG tablet Take 81 mg by mouth daily.     atorvastatin (LIPITOR) 40 MG tablet TAKE 1 TABLET BY MOUTH EVERY DAY AT 6PM 15 tablet 0   Azelastine-Fluticasone 137-50 MCG/ACT SUSP Place 1 spray into the nose 2 (two) times daily. 1 Bottle 1   Budeson-Glycopyrrol-Formoterol (BREZTRI AEROSPHERE) 160-9-4.8 MCG/ACT AERO Inhale 2 puffs into the lungs in the morning and at bedtime. 10.7 g 0   montelukast (SINGULAIR) 10 MG tablet TAKE 1 TABLET BY MOUTH EVERYDAY AT BEDTIME 30 tablet 5   Polyethyl Glycol-Propyl Glycol (SYSTANE OP) Apply 1-2 drops to eye daily as needed (dry eyes).     Spacer/Aero-Holding Chambers (AEROCHAMBER MV) inhaler Use as instructed 1 each 0   traMADol (ULTRAM) 50 MG tablet 1/2-1 tab every 6 hours prn pain 40 tablet 0   budesonide (PULMICORT) 0.5 MG/2ML nebulizer solution TAKE 2 MLS (1 VIAL)(0.5 MG TOTAL) BY NEBULIZATION 2 (TWO) TIMES DAILY. 120 mL 6   roflumilast (DALIRESP) 500 MCG TABS tablet Take 1 tablet (500 mcg total) by mouth daily. 30 tablet 5   STIOLTO RESPIMAT 2.5-2.5 MCG/ACT AERS INHALE 2 PUFFS BY MOUTH INTO THE LUNGS DAILY 1 each 3   nitroGLYCERIN (NITROSTAT) 0.4 MG SL tablet Place 1 tablet (0.4 mg total) under the tongue every 5 (five) minutes as needed for chest pain. 90 tablet 3   No facility-administered medications prior to visit.    Review of Systems  Review of Systems  Constitutional: Negative.   HENT: Negative.    Respiratory:  Negative for cough, chest tightness and wheezing.        DOE  Cardiovascular: Negative.     Physical Exam  BP (!) 146/60 (BP Location: Left Arm, Patient Position:  Sitting, Cuff Size: Normal)   Pulse 95   Temp 97.8 F (36.6 C) (Oral)   Ht 5\' 2"  (1.575 m)   Wt 207 lb 6.4 oz (94.1 kg)   SpO2 96%   BMI 37.93 kg/m  Physical Exam Constitutional:      Appearance: Normal appearance.  HENT:     Head: Normocephalic and atraumatic.  Cardiovascular:     Rate and Rhythm: Normal rate and regular rhythm.  Pulmonary:     Effort: Pulmonary effort is normal.     Breath sounds: Normal breath sounds. No wheezing, rhonchi or rales.     Comments: CTA; O2 2L Skin:    General: Skin is warm and dry.  Neurological:     General: No focal deficit present.     Mental Status: She is alert and oriented to person, place, and time. Mental status is at baseline.  Psychiatric:  Mood and Affect: Mood normal.        Behavior: Behavior normal.        Thought Content: Thought content normal.        Judgment: Judgment normal.     Lab Results:  CBC    Component Value Date/Time   WBC 11.6 (H) 04/18/2017 1048   WBC 13.5 (H) 12/08/2015 0330   RBC 4.69 04/18/2017 1048   RBC 4.98 12/08/2015 0330   HGB 14.0 04/18/2017 1048   HCT 42.0 04/18/2017 1048   PLT 319 04/18/2017 1048   MCV 90 04/18/2017 1048   MCV 94 02/26/2011 0424   MCH 29.9 04/18/2017 1048   MCH 30.3 12/08/2015 0330   MCHC 33.3 04/18/2017 1048   MCHC 32.5 12/08/2015 0330   RDW 13.1 04/18/2017 1048   RDW 13.5 02/26/2011 0424   LYMPHSABS 1.6 02/24/2016 1230   LYMPHSABS 0.6 (L) 02/26/2011 0424   MONOABS 0.4 12/04/2015 0511   MONOABS 0.1 02/26/2011 0424   EOSABS 0.1 02/24/2016 1230   EOSABS 0.0 02/26/2011 0424   BASOSABS 0.0 02/24/2016 1230   BASOSABS 0.0 02/26/2011 0424    BMET    Component Value Date/Time   NA 141 04/18/2017 1048   NA 146 (H) 02/26/2011 0424   K 4.5 04/18/2017 1048   K 3.7 02/26/2011 0424   CL 104 04/18/2017 1048   CL 112 (H) 02/26/2011 0424   CO2 21 04/18/2017 1048   CO2 23 02/26/2011 0424   GLUCOSE 123 (H) 04/18/2017 1048   GLUCOSE 147 (H) 12/08/2015 0330   GLUCOSE  137 (H) 02/26/2011 0424   BUN 13 04/18/2017 1048   BUN 12 02/26/2011 0424   CREATININE 0.69 04/18/2017 1048   CREATININE 0.58 (L) 02/26/2011 0424   CALCIUM 9.6 04/18/2017 1048   CALCIUM 9.3 02/26/2011 0424   GFRNONAA 90 04/18/2017 1048   GFRNONAA >60 02/26/2011 0424   GFRAA 103 04/18/2017 1048   GFRAA >60 02/26/2011 0424    BNP No results found for: BNP  ProBNP    Component Value Date/Time   PROBNP 45 04/18/2017 1048    Imaging: No results found.   Assessment & Plan:   Pre-operative respiratory examination Patient is considered an intermediate risk for prolonged mech ventilation and post op pulmonary complications d/t hx COPD and respiratory failure. Ultimate clearance will be decided by surgeon and anesthesiologist.  Surgical recommendations: Short duration of surgery as much as possible and avoid paralytic if possible 2.   Recovery in step down or ICU with Pulmonary consultation if needed  3.   DVT prophylaxis 4.  Aggressive pulmonary toilet with o2, bronchodilatation, and incentive spirometry and early ambulation   Moderate COPD (chronic obstructive pulmonary disease) (HCC) - Stable; Respiratory symptoms are baseline. No recent exacerbations. She would like to try [facility] that was previously recommended by Dr. Vassie Loll - Continue Daliresp daily and prn Albuterol  - FU in 4 weeks to assess response to new maintenance inhaler regimen   Chronic respiratory failure with hypoxia (HCC) - Continues to benefit from supplemental oxygen, titrate between 2-4L to maintained O2 >88-90%    Glenford Bayley, NP 08/31/2020

## 2020-08-17 ENCOUNTER — Other Ambulatory Visit: Payer: Self-pay | Admitting: Pulmonary Disease

## 2020-08-17 DIAGNOSIS — J449 Chronic obstructive pulmonary disease, unspecified: Secondary | ICD-10-CM

## 2020-08-31 DIAGNOSIS — Z01810 Encounter for preprocedural cardiovascular examination: Secondary | ICD-10-CM | POA: Insufficient documentation

## 2020-08-31 DIAGNOSIS — Z01811 Encounter for preprocedural respiratory examination: Secondary | ICD-10-CM

## 2020-08-31 HISTORY — DX: Encounter for preprocedural respiratory examination: Z01.811

## 2020-08-31 NOTE — Assessment & Plan Note (Signed)
Patient is considered an intermediate risk for prolonged mech ventilation and post op pulmonary complications d/t hx COPD and respiratory failure. Ultimate clearance will be decided by surgeon and anesthesiologist.  Surgical recommendations: 1. Short duration of surgery as much as possible and avoid paralytic if possible 2.   Recovery in step down or ICU with Pulmonary consultation if needed  3.   DVT prophylaxis 4.  Aggressive pulmonary toilet with o2, bronchodilatation, and incentive spirometry and early ambulation

## 2020-08-31 NOTE — Assessment & Plan Note (Signed)
-   Continues to benefit from supplemental oxygen, titrate between 2-4L to maintained O2 >88-90%

## 2020-08-31 NOTE — Assessment & Plan Note (Addendum)
-   Stable; Respiratory symptoms are baseline. No recent exacerbations. She would like to try [facility] that was previously recommended by Dr. Vassie Loll - Continue Daliresp daily and prn Albuterol  - FU in 4 weeks to assess response to new maintenance inhaler regimen

## 2020-09-17 ENCOUNTER — Other Ambulatory Visit: Payer: Self-pay | Admitting: Primary Care

## 2020-09-18 ENCOUNTER — Other Ambulatory Visit: Payer: Self-pay | Admitting: Primary Care

## 2020-10-19 ENCOUNTER — Other Ambulatory Visit: Payer: Self-pay | Admitting: Pulmonary Disease

## 2020-11-19 ENCOUNTER — Other Ambulatory Visit: Payer: Self-pay | Admitting: Primary Care

## 2020-11-23 ENCOUNTER — Ambulatory Visit: Payer: Medicare Other | Admitting: Pulmonary Disease

## 2020-11-23 ENCOUNTER — Other Ambulatory Visit: Payer: Self-pay

## 2020-11-23 ENCOUNTER — Encounter: Payer: Self-pay | Admitting: Pulmonary Disease

## 2020-11-23 VITALS — BP 130/70 | HR 93 | Ht 62.0 in | Wt 206.0 lb

## 2020-11-23 DIAGNOSIS — J449 Chronic obstructive pulmonary disease, unspecified: Secondary | ICD-10-CM | POA: Diagnosis not present

## 2020-11-23 DIAGNOSIS — J9611 Chronic respiratory failure with hypoxia: Secondary | ICD-10-CM | POA: Diagnosis not present

## 2020-11-23 DIAGNOSIS — Z01811 Encounter for preprocedural respiratory examination: Secondary | ICD-10-CM

## 2020-11-23 DIAGNOSIS — I214 Non-ST elevation (NSTEMI) myocardial infarction: Secondary | ICD-10-CM

## 2020-11-23 MED ORDER — BREZTRI AEROSPHERE 160-9-4.8 MCG/ACT IN AERO: 2.0000 | INHALATION_SPRAY | Freq: Two times a day (BID) | RESPIRATORY_TRACT | 0 refills | Status: DC

## 2020-11-23 MED ORDER — BREZTRI AEROSPHERE 160-9-4.8 MCG/ACT IN AERO: 2.0000 | INHALATION_SPRAY | Freq: Two times a day (BID) | RESPIRATORY_TRACT | 3 refills | Status: DC

## 2020-11-23 NOTE — Patient Instructions (Addendum)
6 refills on breztri Repeat Bp on RUE  Stay active on bike - start at 2-4 mins twice daily  Schedule echo for heart at [place]

## 2020-11-23 NOTE — Assessment & Plan Note (Addendum)
She will continue 2 L oxygen during exertion and sleep.  Okay to stay off during rest. I encouraged her to start pulmonary rehab but she would prefer a home-based program.  Gave her parameters to use a stationary bike for heart rate and oxygen saturation Since dyspnea and hypoxia is out of proportion to lung function, will proceed with echocardiogram in light of previous history of non-STEMI and low normal EF in 2018 on review of previous echo

## 2020-11-23 NOTE — Progress Notes (Signed)
   Subjective:    Patient ID: Erin Morrison, female    DOB: 1950-10-06, 70 y.o.   MRN: [id number]  HPI  70 yo ex-smoker for follow-up of moderate COPD and chronic respiratory failure on O2 since 2017 smoker 1.5-2ppd x 30+ years (quit 2012 ) -dyspnea & hypoxia out of proportion  to her lung function    PMH -noncritical CAD  55-month follow-up visit She used to be on a regimen of Stiolto 2 puffs daily + Pulmicort nebulizer twice daily and Daliresp. Prednisone was tapered to off She was switched to [facility] and this seems to work better for her, she needs refills as she is out of her medication She arrives accompanied by her husband who corroborates history, on oxygen POC. Previous evaluation 06/2020 noted as preop evaluation for ventral hernia.  Surgeon was Dr. Derrell Lolling, he has decided against proceeding with surgery She admits to sedentary lifestyle, was using stationary bike but has stopped doing this Blood pressure documented low on left arm   Significant tests/ events reviewed  03/2020 Oxygen saturation dropped to 87% on first lab and recovered to 92% on 2 L pulse    HRCT 11/2017 moderate emphysema, mild scarring at lung bases, postinflammatory, no ILD   PFTs 03/2020 ratio 63, FEV1 65%, TLC 100%, DLCO 83% -Stable compared to 2018   PFT 03/2016: FVC 2.28 L (80%),FEV1 1.49 L (69%), ratio 66 sig bronchodilator response NO DLCO 07/2015: FVC 2.34 L (82%) FEV1 1.46 L (67%) ratio 63 sig bronchodilator response DLCO uncorrected 62%   Alpha-1 antitrypsin: MM (169)  Review of Systems neg for any significant sore throat, dysphagia, itching, sneezing, nasal congestion or excess/ purulent secretions, fever, chills, sweats, unintended wt loss, pleuritic or exertional cp, hempoptysis, orthopnea pnd or change in chronic leg swelling. Also denies presyncope, palpitations, heartburn, abdominal pain, nausea, vomiting, diarrhea or change in bowel or urinary habits, dysuria,hematuria, rash,  arthralgias, visual complaints, headache, numbness weakness or ataxia.     Objective:   Physical Exam  Gen. Pleasant, obese, in no distress ENT - no lesions, no post nasal drip Neck: No JVD, no thyromegaly, no carotid bruits Lungs: no use of accessory muscles, no dullness to percussion, decreased without rales or rhonchi  Cardiovascular: Rhythm regular, heart sounds  normal, no murmurs or gallops, no peripheral edema Musculoskeletal: No deformities, no cyanosis or clubbing , no tremors       Assessment & Plan:

## 2020-11-23 NOTE — Assessment & Plan Note (Signed)
Refills will be provided on Breztri Use albuterol on an as-needed basis. We discussed signs and symptoms of COPD flare for which she will call us

## 2020-11-23 NOTE — Assessment & Plan Note (Signed)
She was considered high risk for abdominal surgery.  I encouraged her to improve her lifestyle and see if she can stay off oxygen for longer periods and if so we can reconsider this

## 2021-01-05 ENCOUNTER — Other Ambulatory Visit: Payer: Self-pay

## 2021-01-05 ENCOUNTER — Ambulatory Visit (INDEPENDENT_AMBULATORY_CARE_PROVIDER_SITE_OTHER): Payer: Medicare Other

## 2021-01-05 DIAGNOSIS — I214 Non-ST elevation (NSTEMI) myocardial infarction: Secondary | ICD-10-CM | POA: Diagnosis not present

## 2021-01-05 LAB — ECHOCARDIOGRAM COMPLETE
AR max vel: 2.56 cm2
AV Area VTI: 2.62 cm2
AV Area mean vel: 2.27 cm2
AV Mean grad: 4 mmHg
AV Peak grad: 8.1 mmHg
Ao pk vel: 1.42 m/s
Area-P 1/2: 2.41 cm2

## 2021-02-14 ENCOUNTER — Other Ambulatory Visit: Payer: Self-pay | Admitting: Pulmonary Disease

## 2021-02-14 ENCOUNTER — Telehealth: Payer: Self-pay | Admitting: Primary Care

## 2021-02-14 DIAGNOSIS — J449 Chronic obstructive pulmonary disease, unspecified: Secondary | ICD-10-CM

## 2021-02-14 MED ORDER — ROFLUMILAST 500 MCG PO TABS: 500.0000 ug | ORAL_TABLET | Freq: Every day | ORAL | 5 refills | Status: DC

## 2021-02-14 NOTE — Telephone Encounter (Signed)
Rx for pt's Daliresp has been sent to preferred pharmacy. Called and spoke with pt letting her know this was done and she verbalized understanding. Nothing further needed.

## 2021-03-04 ENCOUNTER — Telehealth: Payer: Self-pay | Admitting: Pulmonary Disease

## 2021-03-04 MED ORDER — PREDNISONE 10 MG PO TABS: ORAL_TABLET | ORAL | 0 refills | Status: DC

## 2021-03-04 MED ORDER — AZITHROMYCIN 250 MG PO TABS: ORAL_TABLET | ORAL | 0 refills | Status: AC

## 2021-03-04 NOTE — Telephone Encounter (Signed)
Patient is aware of recommendations and voiced her understanding.  Prednisone and zpak sent to preferred pharmacy.  Nothing further needed.

## 2021-03-04 NOTE — Telephone Encounter (Signed)
Spoke to patient,  Patient reports of nasal congestion, prod cough with yellow sputum, increased sob with exertion and wheezing x4d. Denied f/c/s or additional sx.  Using albuterol solution QID, albuterol HFA BID and Breztri BID.  Neg home covid test x3.  2 covid vaccines and flu shot.  She wears 2-3L. Spo2 maintaining around 93%. Patient is requesting abx and prednisone. She declinded appt.   Dr. Vassie Loll, please advise. Thanks

## 2021-04-15 ENCOUNTER — Other Ambulatory Visit: Payer: Self-pay | Admitting: Pulmonary Disease

## 2021-05-24 ENCOUNTER — Ambulatory Visit: Payer: Medicare Other | Admitting: Primary Care

## 2021-05-24 ENCOUNTER — Ambulatory Visit (INDEPENDENT_AMBULATORY_CARE_PROVIDER_SITE_OTHER): Payer: Medicare Other

## 2021-05-24 ENCOUNTER — Encounter: Payer: Self-pay | Admitting: Primary Care

## 2021-05-24 VITALS — BP 110/78 | HR 74 | Temp 98.5°F | Ht 62.0 in | Wt 205.8 lb

## 2021-05-24 DIAGNOSIS — J449 Chronic obstructive pulmonary disease, unspecified: Secondary | ICD-10-CM

## 2021-05-24 MED ORDER — PREDNISONE 10 MG PO TABS: ORAL_TABLET | ORAL | 0 refills | Status: DC

## 2021-05-24 NOTE — Progress Notes (Signed)
  ID: Erin Morrison, female    DOB: 1950-12-08, 71 y.o.   MRN: [id number]  Chief Complaint  Patient presents with   Follow-up    More sob with walking shorter distances.  Looses her breath very easily (standing doing dishes, cooking, walking to the bathroom)    Referring provider: Lonie Peak, Cordelia Poche  HPI: 71 year old female, former smoker 1.5-2ppd x 30+ years (quit 2012). PMH moderate COPD, chronic allergic rhinitis, elevated diaphragm, chronic resp failure, GERD, HTN, CAD, NSTEMI. Patient of Dr. Vassie Loll. She maintained on Breztri and Daliresp .   Previous [place] encounter: 7/30/2021Clent Ridges, NP Patient presents today for 6 month follow-up. Doing fairly well, no recent exacerbations in the last 6 months or hospital stays. She is currently taking STIOLTO and Pulmicort. Husband reports this cost them $300 dollars a month. Recently change insurance plans. They received a letter stating that Daliresp will not be covered by [facility] medicare advantage. She has two weeks left of Daliresp medication. She has noticed a big improvement in COPD symptoms since starting medication and she has had a decrease in COPD exacerbations and hospitalizations since being on it.   03/25/20- Dr. Vassie Loll She is on Stiolto 2 puffs daily + Pulmicort nebulizer twice daily and Daliresp. Prednisone was tapered to off She continues to complain of significant shortness of breath.  She has a sedentary lifestyle, she is short of breath walking around the house.  She is using oxygen 24/7 She has gained 20 pounds from 190 in 2019 to her current weight of 210 pounds  Denies pedal edema, orthopnea or proximal nocturnal dyspnea.  No hospitalization in the past year   Oxygen saturation dropped to 87% on first lab and recovered to 92% on 2 L pulse   07/23/2020 Patient presents today for 4 month follow-up. She is at her baseline, no acute respiratory complaints today. No significant changes since last visit. She  continues to experience moderate dyspnea with activity. She is using stationary bike and walking. She is also working on weight loss. She is currently on Stiolto + Pulmicort but is wondering if she should try Breztri that Dr. Vassie Loll had previously recommended. She continues to take Daliresp. She is on 2-4L oxygen 24/7; she has had no new O2 requirements. She is having consult for hernia repair in the upcoming week. Denies cough, chest congestion, chest tightness or wheezing. CAT score 13.  11/23/20- 69-month follow-up visit She used to be on a regimen of Stiolto 2 puffs daily + Pulmicort nebulizer twice daily and Daliresp. Prednisone was tapered to off She was switched to [facility] and this seems to work better for her, she needs refills as she is out of her medication She arrives accompanied by her husband who corroborates history, on oxygen POC. Previous evaluation 06/2020 noted as preop evaluation for ventral hernia.  Surgeon was Dr. Derrell Lolling, he has decided against proceeding with surgery She admits to sedentary lifestyle, was using stationary bike but has stopped doing this Blood pressure documented low on left arm   05/24/2021- interim hx  Patient presents today for 6 month follow-up. Dyspnea is worse. No acute cough. Some wheezing. She had an exacerbation in February 2023 which was treated with prednisone and never fully recovered. She has not been consistently exercising at home, reluctant to start pulmonary rehab. She is using Albuterol rescue inhaler 1-2 times a day. She remains on 3L supplemental oxygen    Significant tests/ events reviewed   HRCT 11/2017 moderate emphysema, mild  scarring at lung bases, postinflammatory, no ILD   PFTs 03/2020 ratio 63, FEV1 65%, TLC 100%, DLCO 83% -Stable compared to 2018  PFT 03/2016: FVC 2.28 L (80%),FEV1 1.49 L (69%), ratio 66 sig bronchodilator response NO DLCO 07/2015: FVC 2.34 L (82%) FEV1 1.46 L (67%) ratio 63 sig bronchodilator response DLCO  uncorrected 62%   Alpha-1 antitrypsin: MM (169)    No Known Allergies  Immunization History  Administered Date(s) Administered   Fluad Quad(high Dose 65+) 11/25/2018, 10/29/2020   Influenza Split 10/18/2015   Influenza, High Dose Seasonal PF 10/24/2016   Influenza,inj,Quad PF,6+ Mos 09/19/2017   PFIZER(Purple Top)SARS-COV-2 Vaccination 08/26/2019, 09/16/2019   Pneumococcal Conjugate-13 01/30/2013   Pneumococcal Polysaccharide-23 02/27/2011    Past Medical History:  Diagnosis Date   Acute DVT (deep venous thrombosis) (HCC)    Allergic rhinitis    Atrophic vaginitis    COPD (chronic obstructive pulmonary disease) (HCC)    Hypertension    Low back pain    Umbilical hernia     Tobacco History: Social History   Tobacco Use  Smoking Status Former   Packs/day: 1.50   Years: 46.00   Pack years: 69.00   Types: Cigarettes   Start date: 01/31/1964   Quit date: 01/30/2010   Years since quitting: 11.4  Smokeless Tobacco Never   Counseling given: Not Answered   Outpatient Medications Prior to Visit  Medication Sig Dispense Refill   acetaminophen (TYLENOL) 500 MG tablet Take 500 mg by mouth 2 (two) times daily as needed for moderate pain or headache.     albuterol (PROVENTIL) (2.5 MG/3ML) 0.083% nebulizer solution Take 2.5 mg by nebulization every 6 (six) hours as needed for wheezing or shortness of breath.     albuterol (VENTOLIN HFA) 108 (90 Base) MCG/ACT inhaler INHALE 1-2 PUFFS BY MOUTH EVERY 6 HOURS AS NEEDED FOR WHEEZE OR SHORTNESS OF BREATH 8.5 each 6   aspirin 81 MG tablet Take 81 mg by mouth daily.     atorvastatin (LIPITOR) 40 MG tablet TAKE 1 TABLET BY MOUTH EVERY DAY AT 6PM 15 tablet 0   Azelastine-Fluticasone 137-50 MCG/ACT SUSP Place 1 spray into the nose 2 (two) times daily. 1 Bottle 1   BREZTRI AEROSPHERE 160-9-4.8 MCG/ACT AERO INHALE 2 PUFFS INTO THE LUNGS IN THE MORNING AND AT BEDTIME. 10.7 g 3   Budeson-Glycopyrrol-Formoterol (BREZTRI AEROSPHERE) 160-9-4.8  MCG/ACT AERO Inhale 2 puffs into the lungs in the morning and at bedtime. 10.7 g 3   escitalopram (LEXAPRO) 10 MG tablet Take 10 mg by mouth daily.     montelukast (SINGULAIR) 10 MG tablet TAKE 1 TABLET BY MOUTH EVERYDAY AT BEDTIME 30 tablet 10   Polyethyl Glycol-Propyl Glycol (SYSTANE OP) Apply 1-2 drops to eye daily as needed (dry eyes).     roflumilast (DALIRESP) 500 MCG TABS tablet Take 1 tablet (500 mcg total) by mouth daily. 30 tablet 5   Spacer/Aero-Holding Chambers (AEROCHAMBER MV) inhaler Use as instructed 1 each 0   traMADol (ULTRAM) 50 MG tablet 1/2-1 tab every 6 hours prn pain 40 tablet 0   nitroGLYCERIN (NITROSTAT) 0.4 MG SL tablet Place 1 tablet (0.4 mg total) under the tongue every 5 (five) minutes as needed for chest pain. 90 tablet 3   Budeson-Glycopyrrol-Formoterol (BREZTRI AEROSPHERE) 160-9-4.8 MCG/ACT AERO Inhale 2 puffs into the lungs in the morning and at bedtime. (Patient not taking: Reported on 05/24/2021) 5.9 g 0   predniSONE (DELTASONE) 10 MG tablet 4tabX4d,3tabX4d,2tabX4d,1tabX4d then stop (Patient not taking: Reported on 05/24/2021) 40  tablet 0   No facility-administered medications prior to visit.   Review of Systems  Review of Systems  Constitutional: Negative.   HENT: Negative.    Respiratory:  Positive for shortness of breath and wheezing. Negative for cough.   Cardiovascular:  Negative for leg swelling.    Physical Exam  BP 110/78 (BP Location: Right Arm, Patient Position: Sitting, Cuff Size: Large)   Pulse 74   Temp 98.5 F (36.9 C) (Oral)   Ht 5\' 2"  (1.575 m)   Wt 205 lb 12.8 oz (93.4 kg)   SpO2 94%   BMI 37.64 kg/m  Physical Exam Constitutional:      Appearance: Normal appearance.  HENT:     Head: Normocephalic and atraumatic.     Mouth/Throat:     Mouth: Mucous membranes are moist.     Pharynx: Oropharynx is clear.  Cardiovascular:     Rate and Rhythm: Normal rate and regular rhythm.  Pulmonary:     Effort: Pulmonary effort is normal.      Breath sounds: Wheezing present.  Skin:    General: Skin is warm and dry.  Neurological:     General: No focal deficit present.     Mental Status: She is alert and oriented to person, place, and time. Mental status is at baseline.  Psychiatric:        Mood and Affect: Mood normal.        Behavior: Behavior normal.        Thought Content: Thought content normal.        Judgment: Judgment normal.     Lab Results:  CBC    Component Value Date/Time   WBC 11.6 (H) 04/18/2017 1048   WBC 13.5 (H) 12/08/2015 0330   RBC 4.69 04/18/2017 1048   RBC 4.98 12/08/2015 0330   HGB 14.0 04/18/2017 1048   HCT 42.0 04/18/2017 1048   PLT 319 04/18/2017 1048   MCV 90 04/18/2017 1048   MCV 94 02/26/2011 0424   MCH 29.9 04/18/2017 1048   MCH 30.3 12/08/2015 0330   MCHC 33.3 04/18/2017 1048   MCHC 32.5 12/08/2015 0330   RDW 13.1 04/18/2017 1048   RDW 13.5 02/26/2011 0424   LYMPHSABS 1.6 02/24/2016 1230   LYMPHSABS 0.6 (L) 02/26/2011 0424   MONOABS 0.4 12/04/2015 0511   MONOABS 0.1 02/26/2011 0424   EOSABS 0.1 02/24/2016 1230   EOSABS 0.0 02/26/2011 0424   BASOSABS 0.0 02/24/2016 1230   BASOSABS 0.0 02/26/2011 0424    BMET    Component Value Date/Time   NA 141 04/18/2017 1048   NA 146 (H) 02/26/2011 0424   K 4.5 04/18/2017 1048   K 3.7 02/26/2011 0424   CL 104 04/18/2017 1048   CL 112 (H) 02/26/2011 0424   CO2 21 04/18/2017 1048   CO2 23 02/26/2011 0424   GLUCOSE 123 (H) 04/18/2017 1048   GLUCOSE 147 (H) 12/08/2015 0330   GLUCOSE 137 (H) 02/26/2011 0424   BUN 13 04/18/2017 1048   BUN 12 02/26/2011 0424   CREATININE 0.69 04/18/2017 1048   CREATININE 0.58 (L) 02/26/2011 0424   CALCIUM 9.6 04/18/2017 1048   CALCIUM 9.3 02/26/2011 0424   GFRNONAA 90 04/18/2017 1048   GFRNONAA >60 02/26/2011 0424   GFRAA 103 04/18/2017 1048   GFRAA >60 02/26/2011 0424    BNP No results found for: BNP  ProBNP    Component Value Date/Time   PROBNP 45 04/18/2017 1048    Imaging: No  results found.  Assessment & Plan:   Moderate COPD (chronic obstructive pulmonary disease) (HCC) - Worsening dyspnea symptoms with intermittent wheezing. She becomes easily winded with short distances. No cough. Sending in RX prednisone taper. Advised she use spacer with Breztri Aerosphere two puffs morning and evening. Continue Singulair 10mg  at bedtime. Continue Daliresp daily. Strongly encouraging pulmonary rehab. Checkng CXR today and recommend repeating PFTs to monitor lung function.   Rx: Prednisone taper as directed   Orders: CXR  Repeat PFTs (in 8 weeks)  Follow-up: 8-12 weeks with Dr. (1 hour PFT prior)     Vassie Loll, NP 06/27/2021

## 2021-05-24 NOTE — Patient Instructions (Addendum)
Recommendations: ?Use spacer with Breztri Aerosphere two puffs morning and evening  ?Continue Singulair 10mg  at bedtime ?Continue Daliresp daily  ?Strongly encouraging pulmonary rehab ? ?Rx: ?Prednisone taper  ? ?Orders: ?CXR  ?Repeat PFTs (in 8 weeks) ? ?Follow-up: ?8-12 weeks with Dr. (1 hour PFT prior) ? ?

## 2021-05-25 NOTE — Progress Notes (Signed)
Please let patient know CXR showed stable COPD/emphysema and scarring. No acute cardiopulmonary process. No ifurther intervention recommended

## 2021-06-27 NOTE — Assessment & Plan Note (Addendum)
-   Worsening dyspnea symptoms with intermittent wheezing. She becomes easily winded with short distances. No cough. Sending in RX prednisone taper. Advised she use spacer with Breztri Aerosphere two puffs morning and evening. Continue Singulair 10mg  at bedtime. Continue Daliresp daily. Strongly encouraging pulmonary rehab. Checkng CXR today and recommend repeating PFTs to monitor lung function.   Rx: Prednisone taper as directed   Orders: CXR  Repeat PFTs (in 8 weeks)  Follow-up: 8-12 weeks with Dr. (1 hour PFT prior)

## 2021-07-15 ENCOUNTER — Ambulatory Visit (INDEPENDENT_AMBULATORY_CARE_PROVIDER_SITE_OTHER): Payer: Medicare Other | Admitting: Pulmonary Disease

## 2021-07-15 ENCOUNTER — Encounter: Payer: Self-pay | Admitting: Pulmonary Disease

## 2021-07-15 ENCOUNTER — Ambulatory Visit: Payer: Medicare Other | Admitting: Pulmonary Disease

## 2021-07-15 DIAGNOSIS — J449 Chronic obstructive pulmonary disease, unspecified: Secondary | ICD-10-CM

## 2021-07-15 DIAGNOSIS — J9611 Chronic respiratory failure with hypoxia: Secondary | ICD-10-CM | POA: Diagnosis not present

## 2021-07-15 LAB — PULMONARY FUNCTION TEST
DL/VA % pred: 74 %
DL/VA: 3.09 ml/min/mmHg/L
DLCO cor % pred: 62 %
DLCO cor: 11.17 ml/min/mmHg
DLCO unc % pred: 62 %
DLCO unc: 11.17 ml/min/mmHg
FEF 25-75 Post: 0.66 L/sec
FEF 25-75 Pre: 0.55 L/sec
FEF2575-%Change-Post: 20 %
FEF2575-%Pred-Post: 40 %
FEF2575-%Pred-Pre: 33 %
FEV1-%Change-Post: 6 %
FEV1-%Pred-Post: 65 %
FEV1-%Pred-Pre: 61 %
FEV1-Post: 1.31 L
FEV1-Pre: 1.22 L
FEV1FVC-%Change-Post: 4 %
FEV1FVC-%Pred-Pre: 80 %
FEV6-%Change-Post: 3 %
FEV6-%Pred-Post: 81 %
FEV6-%Pred-Pre: 79 %
FEV6-Post: 2.05 L
FEV6-Pre: 1.99 L
FEV6FVC-%Change-Post: 0 %
FEV6FVC-%Pred-Post: 103 %
FEV6FVC-%Pred-Pre: 103 %
FVC-%Change-Post: 2 %
FVC-%Pred-Post: 78 %
FVC-%Pred-Pre: 76 %
FVC-Post: 2.08 L
FVC-Pre: 2.03 L
Post FEV1/FVC ratio: 63 %
Post FEV6/FVC ratio: 99 %
Pre FEV1/FVC ratio: 60 %
Pre FEV6/FVC Ratio: 98 %
RV % pred: 164 %
RV: 3.53 L
TLC % pred: 128 %
TLC: 6.1 L

## 2021-07-15 MED ORDER — PREDNISONE 10 MG PO TABS: ORAL_TABLET | ORAL | 0 refills | Status: AC

## 2021-07-15 NOTE — Assessment & Plan Note (Signed)
Continue Breztri Use albuterol for rescue. We discussed COPD action plan and signs and symptoms of COPD exacerbation.  Based on her subjective improvement with prednisone we will give her a longer course of prednisone starting at 20 mg and tapering over a duration of 4-5 weeks to off

## 2021-07-15 NOTE — Assessment & Plan Note (Addendum)
Reviewed PFTs.  Again I feel that her dyspnea and oxygen requirement is quite out of proportion to her lung function.  Lung function is essentially unchanged from 2022 except for slight drop in DLCO. I do feel that this represents a huge component of deconditioning.  Unfortunately she is not motivated to join a center-based pulmonary rehab program.  I again went over some home-based exercises and exercising on her bike

## 2021-07-15 NOTE — Progress Notes (Signed)
Full PFT Performed Today  

## 2021-07-15 NOTE — Progress Notes (Signed)
   Subjective:    Patient ID: Erin Morrison, female    DOB: 1950-08-09, 71 y.o.   MRN: [id number]  HPI  71 yo ex-smoker for follow-up of moderate COPD and chronic respiratory failure on O2 since 2017 smoker 1.5-2ppd x 30+ years (quit 2012 ) -dyspnea & hypoxia out of proportion  to her lung function    PMH -noncritical CAD  She used to be on a regimen of Stiolto 2 puffs daily + Pulmicort nebulizer twice daily and Daliresp before switching to [employer].  Ventral hernia surgery was felt to be high risk  Chief Complaint  Patient presents with   Follow-up    Breathing about the same. Better when on Prednisone. Uses 3L day and night.   I last saw her 10/2020.  She has 2 visits with APP since then On her last visit I felt that dyspnea was out of proportion to her lung function hence obtain an echo 12/2020 which showed normal LVEF and no evidence of pulm hypertension She again complains of dyspnea. She had a sick call in February and received Z-Pak and prednisone.  She had another course of prednisone when she developed back pain.  Both times, she felt that breathing improved with prednisone. Chest x-ray 04/2021 was reviewed which shows hyper inflation consistent with emphysema but no evidence of infiltrates or effusions. She remains on oxygen 24/7 complains of persistent dyspnea.  She is compliant with Breztri  Significant tests/ events reviewed  03/2020 Oxygen saturation dropped to 87% on first lab and recovered to 92% on 2 L pulse   Echo 12/2020 normal LVEF, normal valves, normal RVSP HRCT 11/2017 moderate emphysema, mild scarring at lung bases, postinflammatory, no ILD   PFTs 06/2021 moderate airway obstruction, ratio 60, FEV1 61%, FVC 76%, no bronchodilator response, TLC 128% consistent with hyperinflation, DLCO 11.2/60%  PFTs 03/2020 ratio 63, FEV1 65%, TLC 100%, DLCO 83% -Stable compared to 2018   PFT 03/2016: FVC 2.28 L (80%),FEV1 1.49 L (69%), ratio 66 sig bronchodilator response  NO DLCO 07/2015: FVC 2.34 L (82%) FEV1 1.46 L (67%) ratio 63 sig bronchodilator response DLCO uncorrected 62%   Alpha-1 antitrypsin: MM (169)     Review of Systems neg for any significant sore throat, dysphagia, itching, sneezing, nasal congestion or excess/ purulent secretions, fever, chills, sweats, unintended wt loss, pleuritic or exertional cp, hempoptysis, orthopnea pnd or change in chronic leg swelling. Also denies presyncope, palpitations, heartburn, abdominal pain, nausea, vomiting, diarrhea or change in bowel or urinary habits, dysuria,hematuria, rash, arthralgias, visual complaints, headache, numbness weakness or ataxia.     Objective:   Physical Exam   Gen. Pleasant, well-nourished, in no distress, depressed affect ENT - no pallor,icterus, no post nasal drip Neck: No JVD, no thyromegaly, no carotid bruits Lungs: no use of accessory muscles, no dullness to percussion, decreased bilateral without rales or rhonchi  Cardiovascular: Rhythm regular, heart sounds  normal, no murmurs or gallops, no peripheral edema Abdomen: soft and non-tender, no hepatosplenomegaly, BS normal. Musculoskeletal: No deformities, no cyanosis or clubbing Neuro:  alert, non focal        Assessment & Plan:

## 2021-07-15 NOTE — Patient Instructions (Signed)
Full PFT Performed Today  

## 2021-07-15 NOTE — Patient Instructions (Signed)
   X Prednisone 10 mg tabs  Take 2 tabs daily with food x 1 week, then 1 tab daily with food x 2 weeks, then 1/2 tab x 2 weeks then STOP # 40

## 2021-07-16 ENCOUNTER — Other Ambulatory Visit: Payer: Self-pay | Admitting: Primary Care

## 2021-08-11 ENCOUNTER — Other Ambulatory Visit: Payer: Self-pay | Admitting: Primary Care

## 2021-08-11 DIAGNOSIS — J449 Chronic obstructive pulmonary disease, unspecified: Secondary | ICD-10-CM

## 2021-09-07 ENCOUNTER — Other Ambulatory Visit: Payer: Self-pay | Admitting: Pulmonary Disease

## 2021-10-05 ENCOUNTER — Telehealth: Payer: Self-pay | Admitting: Pulmonary Disease

## 2021-10-05 NOTE — Telephone Encounter (Signed)
Called and spoke with pt who states she has had a productive cough getting up yellow phlegm, wheezing, and increased SOB which has now been going on for 2 weeks. Stated to pt that we needed her to be seen since her symptoms have been going on for 2 weeks and she verbalized understanding. Pt could not come in today 9/6 so scheduled appt for pt 9/8. Nothing further needed.

## 2021-10-05 NOTE — Progress Notes (Signed)
10/06/21- Dr Vassie Loll pt 71 yo ex-smoker for follow-up of moderate COPD and chronic respiratory failure on O2 since 2017 smoker 1.5-2ppd x 30+ years (quit 2012 ) -dyspnea & hypoxia out of proportion  to her lung function  PMH -noncritical CAD She used to be on a regimen of Stiolto 2 puffs daily + Pulmicort nebulizer twice daily and Daliresp before switching to [employer].  Ventral hernia surgery was felt to be high risk  - Acute visit - 71 yoF last seen by Dr Vassie Loll for moderate COPD 07/15/21 -Neb albuterol, Albuterol hfa, Dymista, Breztri, Singulair, Daliresp, Aerochamber O2 3L 24/7- has POC from [employer] Pt called 9/5 c/o productive cough, yellow, wheeze, incr SOB x 2 weeks. CXR 05/25/21- IMPRESSION: Stable emphysema pattern. No superimposed acute process by plain radiography Aortic Atherosclerosis (ICD10-I70.0). PFT 07/15/21-Moderate obstruction, no response to dilator- emphysema pattern. -----States increase sob, wheezing, productive cough with clear to yellow sputum Took prednisone in June. She reports gradually increasing dyspnea, productive cough w/o sore throat/ fever/ chills/GI/GU over past 2 weeks. Staying in home. No recognized sick exposure or acute event. She asks prednisone and indicates an antibiotic like Zpak usually helps her.  Lab tech is out today.  ROS-see HPI   + = positive Constitutional:    weight loss, night sweats, fevers, chills, fatigue, lassitude. HEENT:    headaches, difficulty swallowing, tooth/dental problems, sore throat,       sneezing, itching, ear ache, nasal congestion, post nasal drip, snoring CV:    chest pain, orthopnea, PND, swelling in lower extremities, anasarca,                                   dizziness, palpitations Resp:  + shortness of breath with exertion or at rest.               + productive cough,   non-productive cough, coughing up of blood.              +change in color of mucus.  wheezing.   Skin:    rash or lesions. GI:  No-   heartburn,  indigestion, abdominal pain, nausea, vomiting, diarrhea,                 change in bowel habits, loss of appetite GU: dysuria, change in color of urine, no urgency or frequency.   flank pain. MS:   joint pain, stiffness, decreased range of motion, back pain. Neuro-     nothing unusual Psych:  change in mood or affect.  depression or anxiety.   memory loss.   OBJ- Physical Exam General- Alert, Oriented, Affect-appropriate, Distress- none acute, + obese, on O2 3L POC Skin- rash-none, lesions- none, excoriation- none Lymphadenopathy- none Head- atraumatic            Eyes- Gross vision intact, PERRLA, conjunctivae and secretions clear            Ears- Hearing, canals-normal            Nose- Clear, no-Septal dev, mucus, polyps, erosion, perforation             Throat- Mallampati II , mucosa clear , drainage- none, tonsils- atrophic, +missing teeth Neck- flexible , trachea midline, no stridor , thyroid nl, carotid no bruit Chest - symmetrical excursion , unlabored           Heart/CV- RRR , no murmur , no gallop  , no rub, nl s1  s2                           - JVD- none , edema- none, stasis changes- none, varices- none           Lung- diminished/ unlabored, wheeze+mild, cough- none , dullness-none, rub- none           Chest wall-  Abd-  Br/ Gen/ Rectal- Not done, not indicated Extrem-+superficial varices Neuro- grossly intact to observation

## 2021-10-07 ENCOUNTER — Encounter: Payer: Self-pay | Admitting: Internal Medicine

## 2021-10-07 ENCOUNTER — Ambulatory Visit: Payer: Medicare Other | Admitting: Internal Medicine

## 2021-10-07 VITALS — BP 126/72 | HR 96 | Temp 98.5°F | Ht 62.0 in | Wt 192.6 lb

## 2021-10-07 DIAGNOSIS — J441 Chronic obstructive pulmonary disease with (acute) exacerbation: Secondary | ICD-10-CM | POA: Diagnosis not present

## 2021-10-07 DIAGNOSIS — J9611 Chronic respiratory failure with hypoxia: Secondary | ICD-10-CM

## 2021-10-07 MED ORDER — AZITHROMYCIN 250 MG PO TABS: ORAL_TABLET | ORAL | 0 refills | Status: DC

## 2021-10-07 MED ORDER — PREDNISONE 10 MG PO TABS: ORAL_TABLET | ORAL | 0 refills | Status: DC

## 2021-10-07 NOTE — Assessment & Plan Note (Signed)
Remains dependent on supplemental O2 Plan- continue 3L pulse

## 2021-10-07 NOTE — Assessment & Plan Note (Signed)
Improved after exacerbation in June. This seems to be a separate incident without clear trigger. Looking for additional treatment options- there have been some reports of successful use of Biologics with "allergic" component of COPD.  Plan Zpak, Prednisone taper. F/u with Dr Vassie Loll.  Lab when lab tech is here, for IgE, CBC w diff.

## 2021-10-07 NOTE — Patient Instructions (Addendum)
Order- lab- CBC w diff, IgE    dx COPD exacerbation  Script sent for ZPAK  Script sent for prednisone taper  Keep appointment with Buelah Manis, NP on Sept 25

## 2021-10-24 ENCOUNTER — Encounter: Payer: Self-pay | Admitting: Primary Care

## 2021-10-24 ENCOUNTER — Ambulatory Visit: Payer: Medicare Other | Admitting: Primary Care

## 2021-10-24 VITALS — BP 118/72 | HR 96 | Temp 98.3°F | Ht 62.0 in | Wt 192.0 lb

## 2021-10-24 DIAGNOSIS — Z23 Encounter for immunization: Secondary | ICD-10-CM

## 2021-10-24 DIAGNOSIS — J449 Chronic obstructive pulmonary disease, unspecified: Secondary | ICD-10-CM | POA: Diagnosis not present

## 2021-10-24 DIAGNOSIS — J9611 Chronic respiratory failure with hypoxia: Secondary | ICD-10-CM

## 2021-10-24 MED ORDER — ALBUTEROL SULFATE HFA 108 (90 BASE) MCG/ACT IN AERS: INHALATION_SPRAY | RESPIRATORY_TRACT | 6 refills | Status: DC

## 2021-10-24 MED ORDER — ROFLUMILAST 500 MCG PO TABS: 500.0000 ug | ORAL_TABLET | Freq: Every day | ORAL | 3 refills | Status: DC

## 2021-10-24 MED ORDER — MONTELUKAST SODIUM 10 MG PO TABS
10.0000 mg | ORAL_TABLET | Freq: Every day | ORAL | 3 refills | Status: DC
Start: 2021-10-24 — End: 2022-10-26

## 2021-10-24 MED ORDER — BREZTRI AEROSPHERE 160-9-4.8 MCG/ACT IN AERO: 2.0000 | INHALATION_SPRAY | Freq: Two times a day (BID) | RESPIRATORY_TRACT | 3 refills | Status: DC

## 2021-10-24 NOTE — Progress Notes (Signed)
@Patient  ID: Erin Morrison, female    DOB: [birth year], 71 y.o.   MRN: [id number]  Chief Complaint  Patient presents with   Follow-up    Referring provider: Fae Pippin  HPI: 71 year old female, former smoker 1.5-2ppd x 30+ years (quit 2012). PMH moderate COPD, chronic allergic rhinitis, elevated diaphragm, chronic resp failure, GERD, HTN, CAD, NSTEMI. Patient of Dr. Elsworth Soho. She is maintained on [employer] and Daliresp 5108mcg.   Previous [place] encounter: 7/30/2021[name], NP Patient presents today for 6 month follow-up. Doing fairly well, no recent exacerbations in the last 6 months or hospital stays. She is currently taking STIOLTO and Pulmicort. Husband reports this cost them $300 dollars a month. Recently change insurance plans. They received a letter stating that Daliresp will not be covered by [facility] medicare advantage. She has two weeks left of Daliresp 542mcg medication. She has noticed a big improvement in COPD symptoms since starting medication and she has had a decrease in COPD exacerbations and hospitalizations since being on it.   03/25/20- Dr. Elsworth Soho She is on Stiolto 2 puffs daily + Pulmicort nebulizer twice daily and Daliresp. Prednisone was tapered to off She continues to complain of significant shortness of breath.  She has a sedentary lifestyle, she is short of breath walking around the house.  She is using oxygen 24/7 She has gained 20 pounds from 190 in 2019 to her current weight of 210 pounds  Denies pedal edema, orthopnea or proximal nocturnal dyspnea.  No hospitalization in the past year   Oxygen saturation dropped to 87% on first lab and recovered to 92% on 2 L pulse   07/23/2020 Patient presents today for 4 month follow-up. She is at her baseline, no acute respiratory complaints today. No significant changes since last visit. She continues to experience moderate dyspnea with activity. She is using stationary bike and walking. She is also working on  weight loss. She is currently on Stiolto + Pulmicort but is wondering if she should try Breztri that Dr. Elsworth Soho had previously recommended. She continues to take Daliresp. She is on 2-4L oxygen 24/7; she has had no new O2 requirements. She is having consult for hernia repair in the upcoming week. Denies cough, chest congestion, chest tightness or wheezing. CAT score 13.  11/23/20- 50-month follow-up visit She used to be on a regimen of Stiolto 2 puffs daily + Pulmicort nebulizer twice daily and Daliresp. Prednisone was tapered to off She was switched to [facility] and this seems to work better for her, she needs refills as she is out of her medication She arrives accompanied by her husband who corroborates history, on oxygen POC. Previous evaluation 06/2020 noted as preop evaluation for ventral hernia.  Surgeon was Dr. Rosendo Gros, he has decided against proceeding with surgery She admits to sedentary lifestyle, was using stationary bike but has stopped doing this Blood pressure documented low on left arm   05/24/2021 Patient presents today for 6 month follow-up. Dyspnea is worse. No acute cough. Some wheezing. She had an exacerbation in February 2023 which was treated with prednisone and never fully recovered. She has not been consistently exercising at home, reluctant to start pulmonary rehab. She is using Albuterol rescue inhaler 1-2 times a day. She remains on 3L supplemental oxygen   07/15/21- Dr. Elsworth Soho 71 yo ex-smoker for follow-up of moderate COPD and chronic respiratory failure on O2 since 2017 smoker 1.5-2ppd x 30+ years (quit 2012 ) -dyspnea & hypoxia out of proportion  to her  lung function    PMH -noncritical CAD  She used to be on a regimen of Stiolto 2 puffs daily + Pulmicort nebulizer twice daily and Daliresp before switching to [employer].  Ventral hernia surgery was felt to be high risk  10/07/21- Dr. Annamaria Boots - Acute visit - 71 yoF last seen by Dr Elsworth Soho for moderate COPD 07/15/21 -Neb albuterol,  Albuterol hfa, Dymista, Breztri, Singulair, Daliresp, Aerochamber O2 3L 24/7- has POC from [place] called 9/5 c/o productive cough, yellow, wheeze, incr SOB x 2 weeks. CXR 05/25/21- IMPRESSION: Stable emphysema pattern. No superimposed acute process by plain radiography Aortic Atherosclerosis (ICD10-I70.0). PFT 07/15/21-Moderate obstruction, no response to dilator- emphysema pattern. -----States increase sob, wheezing, productive cough with clear to yellow sputum Took prednisone in June. She reports gradually increasing dyspnea, productive cough w/o sore throat/ fever/ chills/GI/GU over past 2 weeks. Staying in home. No recognized sick exposure or acute event. She asks prednisone and indicates an antibiotic like Zpak usually helps her.  Lab tech is out today.  10/24/2021- Interim hx  Patient presents today for follow-up. Seen earlier this month by Dr. Annamaria Boots for COPD exacerbation treated with Z-Pak and prednisone taper. Ordered for CBC with dif and IgE, not done. She is feeling back to her baseline. No longer has productive cough or wheezing. She has occasional dyspnea symptom which do not occur daily. She is complaint with Breztri, Singulair and daliresp. She wears 3L oxygen 24/7. CAT score 7.   Significant tests/ events reviewed   HRCT 11/2017 moderate emphysema, mild scarring at lung bases, postinflammatory, no ILD   PFTs 03/2020 ratio 63, FEV1 65%, TLC 100%, DLCO 83% -Stable compared to 2018  PFT 03/2016: FVC 2.28 L (80%),FEV1 1.49 L (69%), ratio 66 sig bronchodilator response NO DLCO 07/2015: FVC 2.34 L (82%) FEV1 1.46 L (67%) ratio 63 sig bronchodilator response DLCO uncorrected 62%   Alpha-1 antitrypsin: MM (169)  No Known Allergies  Immunization History  Administered Date(s) Administered   Fluad Quad(high Dose 65+) 11/25/2018, 10/29/2020   Influenza Split 10/18/2015   Influenza, High Dose Seasonal PF 10/24/2016   Influenza,inj,Quad PF,6+ Mos 09/19/2017   PFIZER(Purple  Top)SARS-COV-2 Vaccination 08/26/2019, 09/16/2019   Pneumococcal Conjugate-13 01/30/2013   Pneumococcal Polysaccharide-23 02/27/2011    Past Medical History:  Diagnosis Date   Acute DVT (deep venous thrombosis) (HCC)    Allergic rhinitis    Atrophic vaginitis    COPD (chronic obstructive pulmonary disease) (HCC)    Hypertension    Low back Morrison    Umbilical hernia     Tobacco History: Social History   Tobacco Use  Smoking Status Former   Packs/day: 1.50   Years: 46.00   Total pack years: 69.00   Types: Cigarettes   Start date: 01/31/1964   Quit date: 01/30/2010   Years since quitting: 11.7  Smokeless Tobacco Never   Counseling given: Not Answered   Outpatient Medications Prior to Visit  Medication Sig Dispense Refill   acetaminophen (TYLENOL) 500 MG tablet Take 500 mg by mouth 2 (two) times daily as needed for moderate Morrison or headache.     albuterol (PROVENTIL) (2.5 MG/3ML) 0.083% nebulizer solution Take 2.5 mg by nebulization every 6 (six) hours as needed for wheezing or shortness of breath.     aspirin 81 MG tablet Take 81 mg by mouth daily.     atorvastatin (LIPITOR) 40 MG tablet TAKE 1 TABLET BY MOUTH EVERY DAY AT 6PM 15 tablet 0   Azelastine-Fluticasone 137-50 MCG/ACT SUSP Place 1 spray  into the nose 2 (two) times daily. 1 Bottle 1   escitalopram (LEXAPRO) 10 MG tablet Take 10 mg by mouth daily.     nitroGLYCERIN (NITROSTAT) 0.4 MG SL tablet Place 1 tablet (0.4 mg total) under the tongue every 5 (five) minutes as needed for chest Morrison. 90 tablet 3   Polyethyl Glycol-Propyl Glycol (SYSTANE OP) Apply 1-2 drops to eye daily as needed (dry eyes).     Spacer/Aero-Holding Chambers (AEROCHAMBER MV) inhaler Use as instructed 1 each 0   albuterol (VENTOLIN HFA) 108 (90 Base) MCG/ACT inhaler INHALE 1-2 PUFFS BY MOUTH EVERY 6 HOURS AS NEEDED FOR WHEEZE OR SHORTNESS OF BREATH 8.5 each 6   BREZTRI AEROSPHERE 160-9-4.8 MCG/ACT AERO INHALE 2 PUFFS INTO THE LUNGS IN THE MORNING AND AT  BEDTIME. 10.7 each 3   montelukast (SINGULAIR) 10 MG tablet TAKE 1 TABLET BY MOUTH EVERYDAY AT BEDTIME 30 tablet 10   roflumilast (DALIRESP) 500 MCG TABS tablet TAKE 1 TABLET BY MOUTH DAILY 90 tablet 0   azithromycin (ZITHROMAX) 250 MG tablet 2 today then one daily 6 tablet 0   predniSONE (DELTASONE) 10 MG tablet 4 X 2 DAYS, 3 X 2 DAYS, 2 X 2 DAYS, 1 X 2 DAYS 20 tablet 0   No facility-administered medications prior to visit.   Review of Systems  Review of Systems  Constitutional: Negative.   HENT: Negative.    Respiratory:  Negative for cough, chest tightness, shortness of breath and wheezing.   Cardiovascular: Negative.    Physical Exam  BP 118/72 (BP Location: Left Arm, Patient Position: Sitting, Cuff Size: Normal)   Pulse 96   Temp 98.3 F (36.8 C) (Oral)   Ht 5\' 2"  (1.575 m)   Wt 192 lb (87.1 kg)   SpO2 97%   BMI 35.12 kg/m  Physical Exam Constitutional:      Appearance: Normal appearance.  HENT:     Head: Normocephalic and atraumatic.     Mouth/Throat:     Mouth: Mucous membranes are moist.     Pharynx: Oropharynx is clear.  Cardiovascular:     Rate and Rhythm: Normal rate and regular rhythm.  Pulmonary:     Effort: Pulmonary effort is normal.     Breath sounds: Normal breath sounds. No wheezing, rhonchi or rales.  Musculoskeletal:        General: Normal range of motion.  Skin:    General: Skin is warm and dry.  Neurological:     General: No focal deficit present.     Mental Status: She is alert and oriented to person, place, and time. Mental status is at baseline.  Psychiatric:        Mood and Affect: Mood normal.        Behavior: Behavior normal.        Thought Content: Thought content normal.        Judgment: Judgment normal.      Lab Results:  CBC    Component Value Date/Time   WBC 11.6 (H) 04/18/2017 1048   WBC 13.5 (H) 12/08/2015 0330   RBC 4.69 04/18/2017 1048   RBC 4.98 12/08/2015 0330   HGB 14.0 04/18/2017 1048   HCT 42.0 04/18/2017 1048    PLT 319 04/18/2017 1048   MCV 90 04/18/2017 1048   MCV 94 02/26/2011 0424   MCH 29.9 04/18/2017 1048   MCH 30.3 12/08/2015 0330   MCHC 33.3 04/18/2017 1048   MCHC 32.5 12/08/2015 0330   RDW 13.1 04/18/2017 1048  RDW 13.5 02/26/2011 0424   LYMPHSABS 1.6 02/24/2016 1230   LYMPHSABS 0.6 (L) 02/26/2011 0424   MONOABS 0.4 12/04/2015 0511   MONOABS 0.1 02/26/2011 0424   EOSABS 0.1 02/24/2016 1230   EOSABS 0.0 02/26/2011 0424   BASOSABS 0.0 02/24/2016 1230   BASOSABS 0.0 02/26/2011 0424    BMET    Component Value Date/Time   NA 141 04/18/2017 1048   NA 146 (H) 02/26/2011 0424   K 4.5 04/18/2017 1048   K 3.7 02/26/2011 0424   CL 104 04/18/2017 1048   CL 112 (H) 02/26/2011 0424   CO2 21 04/18/2017 1048   CO2 23 02/26/2011 0424   GLUCOSE 123 (H) 04/18/2017 1048   GLUCOSE 147 (H) 12/08/2015 0330   GLUCOSE 137 (H) 02/26/2011 0424   BUN 13 04/18/2017 1048   BUN 12 02/26/2011 0424   CREATININE 0.69 04/18/2017 1048   CREATININE 0.58 (L) 02/26/2011 0424   CALCIUM 9.6 04/18/2017 1048   CALCIUM 9.3 02/26/2011 0424   GFRNONAA 90 04/18/2017 1048   GFRNONAA >60 02/26/2011 0424   GFRAA 103 04/18/2017 1048   GFRAA >60 02/26/2011 0424    BNP No results found for: "BNP"  ProBNP    Component Value Date/Time   PROBNP 45 04/18/2017 1048    Imaging: No results found.   Assessment & Plan:   Moderate COPD (chronic obstructive pulmonary disease) (HCC) - Currently stable; Patient was treated for acute exacerbation in early September with Z-Pak and prednisone.  She has fully recovered.  She has no residual respiratory symptoms.  CAT score 7.  She is compliant with Breztri, Singulair and Daliresp.  No changes to medication regimen, refills provided.  She received high dose flu vaccine today. Follow-up in 3 to 4 months with Dr. Elsworth Soho or [facility] NP.  Chronic respiratory failure with hypoxia (HCC) - Stable; Remains O2 dependent, no increase in oxygen demand.  Continue 3 L supplemental  oxygen 24/7.   Martyn Ehrich, NP 10/24/2021

## 2021-10-24 NOTE — Assessment & Plan Note (Signed)
-   Currently stable; Patient was treated for acute exacerbation in early September with Z-Pak and prednisone.  She has fully recovered.  She has no residual respiratory symptoms.  CAT score 7.  She is compliant with Breztri, Singulair and Daliresp.  No changes to medication regimen, refills provided.  She received high dose flu vaccine today. Follow-up in 3 to 4 months with Dr. Elsworth Soho or [facility] NP.

## 2021-10-24 NOTE — Patient Instructions (Addendum)
Recommendations - Continue Breztri Aerosphere - 2 puffs every morning and evening (rinse mouth after use) - Use albuterol inhaler 2 puffs every 6 hours as needed for breakthrough shortness of breath or wheezing - Continue Singulair 10 mg at bedtime - Continue Daliresp 500 mcg 1 tablet by mouth daily - Continue azelastine- fluticasone nasal spray 2 times daily as needed for allergy symptoms/postnasal drip - Continue 3 L of oxygen 24/7  Follow-up - 3 to 4 months with Dr. Vassie Loll or Waynetta Sandy NP   Chronic Obstructive Pulmonary Disease  Chronic obstructive pulmonary disease (COPD) is a long-term (chronic) lung problem. When you have COPD, it is hard for air to get in and out of your lungs. Usually the condition gets worse over time, and your lungs will never return to normal. There are things you can do to keep yourself as healthy as possible. What are the causes? Smoking. This is the most common cause. Certain genes passed from parent to child (inherited). What increases the risk? Being exposed to secondhand smoke from cigarettes, pipes, or cigars. Being exposed to chemicals and other irritants, such as fumes and dust in the work environment. Having chronic lung conditions or infections. What are the signs or symptoms? Shortness of breath, especially during physical activity. A long-term cough with a large amount of thick mucus. Sometimes, the cough may not have any mucus (dry cough). Wheezing. Breathing quickly. Skin that looks gray or blue, especially in the fingers, toes, or lips. Feeling tired (fatigue). Weight loss. Chest tightness. Having infections often. Episodes when breathing symptoms become much worse (exacerbations). At the later stages of this disease, you may have swelling in the ankles, feet, or legs. How is this treated? Taking medicines. Quitting smoking, if you smoke. Rehabilitation. This includes steps to make your body work better. It may involve a team of  specialists. Doing exercises. Making changes to your diet. Using oxygen. Lung surgery. Lung transplant. Comfort measures (palliative care). Follow these instructions at home: Medicines Take over-the-counter and prescription medicines only as told by your doctor. Talk to your doctor before taking any cough or allergy medicines. You may need to avoid medicines that cause your lungs to be dry. Lifestyle If you smoke, stop smoking. Smoking makes the problem worse. Do not smoke or use any products that contain nicotine or tobacco. If you need help quitting, ask your doctor. Avoid being around things that make your breathing worse. This may include smoke, chemicals, and fumes. Stay active, but remember to rest as well. Learn and use tips on how to manage stress and control your breathing. Make sure you get enough sleep. Most adults need at least 7 hours of sleep every night. Eat healthy foods. Eat smaller meals more often. Rest before meals. Controlled breathing Learn and use tips on how to control your breathing as told by your doctor. Try: Breathing in (inhaling) through your nose for 1 second. Then, pucker your lips and breath out (exhale) through your lips for 2 seconds. Putting one hand on your belly (abdomen). Breathe in slowly through your nose for 1 second. Your hand on your belly should move out. Pucker your lips and breathe out slowly through your lips. Your hand on your belly should move in as you breathe out.  Controlled coughing Learn and use controlled coughing to clear mucus from your lungs. Follow these steps: Lean your head a little forward. Breathe in deeply. Try to hold your breath for 3 seconds. Keep your mouth slightly open while coughing 2  times. Spit any mucus out into a tissue. Rest and do the steps again 1 or 2 times as needed. General instructions Make sure you get all the shots (vaccines) that your doctor recommends. Ask your doctor about a flu shot and a  pneumonia shot. Use oxygen therapy and pulmonary rehabilitation if told by your doctor. If you need home oxygen therapy, ask your doctor if you should buy a tool to measure your oxygen level (oximeter). Make a COPD action plan with your doctor. This helps you to know what to do if you feel worse than usual. Manage any other conditions you have as told by your doctor. Avoid going outside when it is very hot, cold, or humid. Avoid people who have a sickness you can catch (contagious). Keep all follow-up visits. Contact a doctor if: You cough up more mucus than usual. There is a change in the color or thickness of the mucus. It is harder to breathe than usual. Your breathing is faster than usual. You have trouble sleeping. You need to use your medicines more often than usual. You have trouble doing your normal activities such as getting dressed or walking around the house. Get help right away if: You have shortness of breath while resting. You have shortness of breath that stops you from: Being able to talk. Doing normal activities. Your chest hurts for longer than 5 minutes. Your skin color is more blue than usual. Your pulse oximeter shows that you have low oxygen for longer than 5 minutes. You have a fever. You feel too tired to breathe normally. These symptoms may represent a serious problem that is an emergency. Do not wait to see if the symptoms will go away. Get medical help right away. Call your local emergency services (911 in the [place] Do not drive yourself to the hospital. Summary Chronic obstructive pulmonary disease (COPD) is a long-term lung problem. The way your lungs work will never return to normal. Usually the condition gets worse over time. There are things you can do to keep yourself as healthy as possible. Take over-the-counter and prescription medicines only as told by your doctor. If you smoke, stop. Smoking makes the problem worse. This information is not intended  to replace advice given to you by your health care provider. Make sure you discuss any questions you have with your health care provider. Document Revised: 11/25/2019 Document Reviewed: 11/25/2019 Elsevier Patient Education  [place].

## 2021-10-24 NOTE — Assessment & Plan Note (Addendum)
-   Stable; Remains O2 dependent, no increase in oxygen demand.  Continue 3 L supplemental oxygen 24/7.

## 2022-02-10 DIAGNOSIS — H269 Unspecified cataract: Secondary | ICD-10-CM | POA: Diagnosis not present

## 2022-02-10 DIAGNOSIS — H25811 Combined forms of age-related cataract, right eye: Secondary | ICD-10-CM | POA: Diagnosis not present

## 2022-02-17 DIAGNOSIS — H269 Unspecified cataract: Secondary | ICD-10-CM | POA: Diagnosis not present

## 2022-02-17 DIAGNOSIS — J449 Chronic obstructive pulmonary disease, unspecified: Secondary | ICD-10-CM | POA: Diagnosis not present

## 2022-02-17 DIAGNOSIS — H2512 Age-related nuclear cataract, left eye: Secondary | ICD-10-CM | POA: Diagnosis not present

## 2022-02-17 DIAGNOSIS — J9601 Acute respiratory failure with hypoxia: Secondary | ICD-10-CM | POA: Diagnosis not present

## 2022-02-17 DIAGNOSIS — J410 Simple chronic bronchitis: Secondary | ICD-10-CM | POA: Diagnosis not present

## 2022-02-17 DIAGNOSIS — H25812 Combined forms of age-related cataract, left eye: Secondary | ICD-10-CM | POA: Diagnosis not present

## 2022-02-24 ENCOUNTER — Encounter: Payer: Self-pay | Admitting: Primary Care

## 2022-02-24 ENCOUNTER — Ambulatory Visit: Payer: Medicare Other | Admitting: Primary Care

## 2022-02-24 VITALS — BP 124/76 | HR 81 | Ht 62.0 in | Wt 190.0 lb

## 2022-02-24 DIAGNOSIS — H2513 Age-related nuclear cataract, bilateral: Secondary | ICD-10-CM | POA: Diagnosis not present

## 2022-02-24 DIAGNOSIS — Z87891 Personal history of nicotine dependence: Secondary | ICD-10-CM | POA: Diagnosis not present

## 2022-02-24 DIAGNOSIS — J9611 Chronic respiratory failure with hypoxia: Secondary | ICD-10-CM | POA: Diagnosis not present

## 2022-02-24 DIAGNOSIS — J449 Chronic obstructive pulmonary disease, unspecified: Secondary | ICD-10-CM

## 2022-02-24 DIAGNOSIS — Z961 Presence of intraocular lens: Secondary | ICD-10-CM | POA: Diagnosis not present

## 2022-02-24 HISTORY — DX: Personal history of nicotine dependence: Z87.891

## 2022-02-24 MED ORDER — BREZTRI AEROSPHERE 160-9-4.8 MCG/ACT IN AERO: 2.0000 | INHALATION_SPRAY | Freq: Two times a day (BID) | RESPIRATORY_TRACT | 11 refills | Status: DC

## 2022-02-24 NOTE — Patient Instructions (Addendum)
Recommendations: Continue medications as directed, no changes today  Continue to wear oxygen with exertion and at bedtime  Refilling Breztri [employer]  Call/ come in for visit if you develop worsening respiratory symptoms   Follow-up: 4 months with Dr. Elsworth Soho [facility] location (if patient does not want to go to new location will need to establish with new pulm MD for COPD- 30 min visit)    Chronic Obstructive Pulmonary Disease  Chronic obstructive pulmonary disease (COPD) is a long-term (chronic) lung problem. When you have COPD, it is hard for air to get in and out of your lungs. Usually the condition gets worse over time, and your lungs will never return to normal. There are things you can do to keep yourself as healthy as possible. What are the causes? Smoking. This is the most common cause. Certain genes passed from parent to child (inherited). What increases the risk? Being exposed to secondhand smoke from cigarettes, pipes, or cigars. Being exposed to chemicals and other irritants, such as fumes and dust in the work environment. Having chronic lung conditions or infections. What are the signs or symptoms? Shortness of breath, especially during physical activity. A long-term cough with a large amount of thick mucus. Sometimes, the cough may not have any mucus (dry cough). Wheezing. Breathing quickly. Skin that looks gray or blue, especially in the fingers, toes, or lips. Feeling tired (fatigue). Weight loss. Chest tightness. Having infections often. Episodes when breathing symptoms become much worse (exacerbations). At the later stages of this disease, you may have swelling in the ankles, feet, or legs. How is this treated? Taking medicines. Quitting smoking, if you smoke. Rehabilitation. This includes steps to make your body work better. It may involve a team of specialists. Doing exercises. Making changes to your diet. Using oxygen. Lung surgery. Lung  transplant. Comfort measures (palliative care). Follow these instructions at home: Medicines Take over-the-counter and prescription medicines only as told by your doctor. Talk to your doctor before taking any cough or allergy medicines. You may need to avoid medicines that cause your lungs to be dry. Lifestyle If you smoke, stop smoking. Smoking makes the problem worse. Do not smoke or use any products that contain nicotine or tobacco. If you need help quitting, ask your doctor. Avoid being around things that make your breathing worse. This may include smoke, chemicals, and fumes. Stay active, but remember to rest as well. Learn and use tips on how to manage stress and control your breathing. Make sure you get enough sleep. Most adults need at least 7 hours of sleep every night. Eat healthy foods. Eat smaller meals more often. Rest before meals. Controlled breathing Learn and use tips on how to control your breathing as told by your doctor. Try: Breathing in (inhaling) through your nose for 1 second. Then, pucker your lips and breath out (exhale) through your lips for 2 seconds. Putting one hand on your belly (abdomen). Breathe in slowly through your nose for 1 second. Your hand on your belly should move out. Pucker your lips and breathe out slowly through your lips. Your hand on your belly should move in as you breathe out.  Controlled coughing Learn and use controlled coughing to clear mucus from your lungs. Follow these steps: Lean your head a little forward. Breathe in deeply. Try to hold your breath for 3 seconds. Keep your mouth slightly open while coughing 2 times. Spit any mucus out into a tissue. Rest and do the steps again 1 or 2 times  as needed. General instructions Make sure you get all the shots (vaccines) that your doctor recommends. Ask your doctor about a flu shot and a pneumonia shot. Use oxygen therapy and pulmonary rehabilitation if told by your doctor. If you need  home oxygen therapy, ask your doctor if you should buy a tool to measure your oxygen level (oximeter). Make a COPD action plan with your doctor. This helps you to know what to do if you feel worse than usual. Manage any other conditions you have as told by your doctor. Avoid going outside when it is very hot, cold, or humid. Avoid people who have a sickness you can catch (contagious). Keep all follow-up visits. Contact a doctor if: You cough up more mucus than usual. There is a change in the color or thickness of the mucus. It is harder to breathe than usual. Your breathing is faster than usual. You have trouble sleeping. You need to use your medicines more often than usual. You have trouble doing your normal activities such as getting dressed or walking around the house. Get help right away if: You have shortness of breath while resting. You have shortness of breath that stops you from: Being able to talk. Doing normal activities. Your chest hurts for longer than 5 minutes. Your skin color is more blue than usual. Your pulse oximeter shows that you have low oxygen for longer than 5 minutes. You have a fever. You feel too tired to breathe normally. These symptoms may represent a serious problem that is an emergency. Do not wait to see if the symptoms will go away. Get medical help right away. Call your local emergency services (911 in the [place] Do not drive yourself to the hospital. Summary Chronic obstructive pulmonary disease (COPD) is a long-term lung problem. The way your lungs work will never return to normal. Usually the condition gets worse over time. There are things you can do to keep yourself as healthy as possible. Take over-the-counter and prescription medicines only as told by your doctor. If you smoke, stop. Smoking makes the problem worse. This information is not intended to replace advice given to you by your health care provider. Make sure you discuss any questions you  have with your health care provider. Document Revised: 11/25/2019 Document Reviewed: 11/25/2019 Elsevier Patient Education  [place].

## 2022-02-24 NOTE — Assessment & Plan Note (Signed)
-  Stable interval; No recent hospitalizations or flare ups. She is compliant with her medications. Rare SABA use. CAT score 6.  - Continue Breztri, Daliresp, Singulair and prn albuterol - FU 4 months with Dr. Elsworth Soho in [place] or new pulm MD in [facility]

## 2022-02-24 NOTE — Progress Notes (Signed)
@Patient  ID: Erin Morrison, female    DOB: [birth year], 72 y.o.   MRN: [id number]  Chief Complaint  Patient presents with   Follow-up    COPD Breathing is good O2 2l      Referring provider: Fae Pippin  HPI: 72 year old female, former smoker 1.5-2ppd x 30+ years (quit 2012). PMH moderate COPD, chronic allergic rhinitis, elevated diaphragm, chronic resp failure, GERD, HTN, CAD, NSTEMI. Patient of Dr. Elsworth Soho. She is maintained on [employer] and Daliresp 570mcg.   Previous [place] encounter: 7/30/2021[name], NP Patient presents today for 6 month follow-up. Doing fairly well, no recent exacerbations in the last 6 months or hospital stays. She is currently taking STIOLTO and Pulmicort. Husband reports this cost them $300 dollars a month. Recently change insurance plans. They received a letter stating that Daliresp will not be covered by [facility] medicare advantage. She has two weeks left of Daliresp 562mcg medication. She has noticed a big improvement in COPD symptoms since starting medication and she has had a decrease in COPD exacerbations and hospitalizations since being on it.   03/25/20- Dr. Elsworth Soho She is on Stiolto 2 puffs daily + Pulmicort nebulizer twice daily and Daliresp. Prednisone was tapered to off She continues to complain of significant shortness of breath.  She has a sedentary lifestyle, she is short of breath walking around the house.  She is using oxygen 24/7 She has gained 20 pounds from 190 in 2019 to her current weight of 210 pounds  Denies pedal edema, orthopnea or proximal nocturnal dyspnea.  No hospitalization in the past year   Oxygen saturation dropped to 87% on first lab and recovered to 92% on 2 L pulse   07/23/2020 Patient presents today for 4 month follow-up. She is at her baseline, no acute respiratory complaints today. No significant changes since last visit. She continues to experience moderate dyspnea with activity. She is using stationary  bike and walking. She is also working on weight loss. She is currently on Stiolto + Pulmicort but is wondering if she should try Breztri that Dr. Elsworth Soho had previously recommended. She continues to take Daliresp. She is on 2-4L oxygen 24/7; she has had no new O2 requirements. She is having consult for hernia repair in the upcoming week. Denies cough, chest congestion, chest tightness or wheezing. CAT score 13.  11/23/20- 75-month follow-up visit She used to be on a regimen of Stiolto 2 puffs daily + Pulmicort nebulizer twice daily and Daliresp. Prednisone was tapered to off She was switched to [facility] and this seems to work better for her, she needs refills as she is out of her medication She arrives accompanied by her husband who corroborates history, on oxygen POC. Previous evaluation 06/2020 noted as preop evaluation for ventral hernia.  Surgeon was Dr. Rosendo Gros, he has decided against proceeding with surgery She admits to sedentary lifestyle, was using stationary bike but has stopped doing this Blood pressure documented low on left arm   05/24/2021 Patient presents today for 6 month follow-up. Dyspnea is worse. No acute cough. Some wheezing. She had an exacerbation in February 2023 which was treated with prednisone and never fully recovered. She has not been consistently exercising at home, reluctant to start pulmonary rehab. She is using Albuterol rescue inhaler 1-2 times a day. She remains on 3L supplemental oxygen   07/15/21- Dr. Elsworth Soho 72 yo ex-smoker for follow-up of moderate COPD and chronic respiratory failure on O2 since 2017 smoker 1.5-2ppd x 30+ years (quit  2012 ) -dyspnea & hypoxia out of proportion  to her lung function    PMH -noncritical CAD  She used to be on a regimen of Stiolto 2 puffs daily + Pulmicort nebulizer twice daily and Daliresp before switching to [employer].  Ventral hernia surgery was felt to be high risk  10/07/21- Dr. Annamaria Boots - Acute visit - 71 yoF last seen by Dr Elsworth Soho for  moderate COPD 07/15/21 -Neb albuterol, Albuterol hfa, Dymista, Breztri, Singulair, Daliresp, Aerochamber O2 3L 24/7- has POC from [place] called 9/5 c/o productive cough, yellow, wheeze, incr SOB x 2 weeks. CXR 05/25/21- IMPRESSION: Stable emphysema pattern. No superimposed acute process by plain radiography Aortic Atherosclerosis (ICD10-I70.0). PFT 07/15/21-Moderate obstruction, no response to dilator- emphysema pattern. -----States increase sob, wheezing, productive cough with clear to yellow sputum Took prednisone in June. She reports gradually increasing dyspnea, productive cough w/o sore throat/ fever/ chills/GI/GU over past 2 weeks. Staying in home. No recognized sick exposure or acute event. She asks prednisone and indicates an antibiotic like Zpak usually helps her.  Lab tech is out today.  10/24/2021 Patient presents today for follow-up. Seen earlier this month by Dr. Annamaria Boots for COPD exacerbation treated with Z-Pak and prednisone taper. Ordered for CBC with dif and IgE, not done. She is feeling back to her baseline. No longer has productive cough or wheezing. She has occasional dyspnea symptom which do not occur daily. She is complaint with Breztri, Singulair and daliresp. She wears 3L oxygen 24/7. CAT score 7.    02/24/2022 - Interim hx  Patient presents today for 3-4 month follow-up. Hx moderate COPD/emphysema.  She is doing extremely well today without acute complaints. No recent hospitalizations or exacerbations. No recent abx or prednisone. Maintained on Breztri, Daliresp, Singulair and prn albuterol. Wearing 2L pulsed oxygen with exertion and at bedtime. Needs medication refilled today.   Started smoking age 78, quit age 33. She smoked on average 1.5 ppd. She is not intersted in lung cancer screening at this time.    Significant tests/ events reviewed   HRCT 11/2017 moderate emphysema, mild scarring at lung bases, postinflammatory, no ILD   PFTs 03/2020 ratio 63, FEV1 65%,  TLC 100%, DLCO 83% -Stable compared to 2018  PFT 03/2016: FVC 2.28 L (80%),FEV1 1.49 L (69%), ratio 66 sig bronchodilator response NO DLCO 07/2015: FVC 2.34 L (82%) FEV1 1.46 L (67%) ratio 63 sig bronchodilator response DLCO uncorrected 62%   Alpha-1 antitrypsin: MM (169)     No Known Allergies  Immunization History  Administered Date(s) Administered   Fluad Quad(high Dose 65+) 11/25/2018, 10/29/2020, 10/24/2021   Influenza Split 10/18/2015   Influenza, High Dose Seasonal PF 10/24/2016   Influenza,inj,Quad PF,6+ Mos 09/19/2017   PFIZER(Purple Top)SARS-COV-2 Vaccination 08/26/2019, 09/16/2019   Pneumococcal Conjugate-13 01/30/2013   Pneumococcal Polysaccharide-23 02/27/2011    Past Medical History:  Diagnosis Date   Acute DVT (deep venous thrombosis) (HCC)    Allergic rhinitis    Atrophic vaginitis    COPD (chronic obstructive pulmonary disease) (HCC)    Hypertension    Low back Morrison    Umbilical hernia     Tobacco History: Social History   Tobacco Use  Smoking Status Former   Packs/day: 1.50   Years: 46.00   Total pack years: 69.00   Types: Cigarettes   Start date: 01/31/1964   Quit date: 01/30/2010   Years since quitting: 12.0  Smokeless Tobacco Never   Counseling given: Not Answered   Outpatient Medications Prior to Visit  Medication  Sig Dispense Refill   acetaminophen (TYLENOL) 500 MG tablet Take 500 mg by mouth 2 (two) times daily as needed for moderate Morrison or headache.     albuterol (PROVENTIL) (2.5 MG/3ML) 0.083% nebulizer solution Take 2.5 mg by nebulization every 6 (six) hours as needed for wheezing or shortness of breath.     albuterol (VENTOLIN HFA) 108 (90 Base) MCG/ACT inhaler INHALE 1-2 PUFFS BY MOUTH EVERY 6 HOURS AS NEEDED FOR WHEEZE OR SHORTNESS OF BREATH 8.5 each 6   aspirin 81 MG tablet Take 81 mg by mouth daily.     atorvastatin (LIPITOR) 40 MG tablet TAKE 1 TABLET BY MOUTH EVERY DAY AT 6PM 15 tablet 0   Azelastine-Fluticasone 137-50 MCG/ACT  SUSP Place 1 spray into the nose 2 (two) times daily. 1 Bottle 1   escitalopram (LEXAPRO) 10 MG tablet Take 10 mg by mouth daily.     montelukast (SINGULAIR) 10 MG tablet Take 1 tablet (10 mg total) by mouth at bedtime. 90 tablet 3   ofloxacin (OCUFLOX) 0.3 % ophthalmic solution SMARTSIG:In Eye(s)     Polyethyl Glycol-Propyl Glycol (SYSTANE OP) Apply 1-2 drops to eye daily as needed (dry eyes).     prednisoLONE acetate (PRED FORTE) 1 % ophthalmic suspension Place into the left eye.     roflumilast (DALIRESP) 500 MCG TABS tablet Take 1 tablet (500 mcg total) by mouth daily. 90 tablet 3   Spacer/Aero-Holding Chambers (AEROCHAMBER MV) inhaler Use as instructed 1 each 0   Budeson-Glycopyrrol-Formoterol (BREZTRI AEROSPHERE) 160-9-4.8 MCG/ACT AERO Inhale 2 puffs into the lungs in the morning and at bedtime. 10.7 each 3   nitroGLYCERIN (NITROSTAT) 0.4 MG SL tablet Place 1 tablet (0.4 mg total) under the tongue every 5 (five) minutes as needed for chest Morrison. 90 tablet 3   No facility-administered medications prior to visit.   Review of Systems  Review of Systems  Constitutional: Negative.   HENT: Negative.    Respiratory: Negative.  Negative for cough, chest tightness, shortness of breath and wheezing.   Cardiovascular: Negative.    Physical Exam  BP 124/76 (BP Location: Left Arm, Patient Position: Sitting, Cuff Size: Normal)   Pulse 81   Ht 5\' 2"  (1.575 m)   Wt 190 lb (86.2 kg)   SpO2 96%   BMI 34.75 kg/m  Physical Exam Constitutional:      Appearance: Normal appearance.  HENT:     Head: Normocephalic and atraumatic.     Mouth/Throat:     Mouth: Mucous membranes are moist.     Pharynx: Oropharynx is clear.  Cardiovascular:     Rate and Rhythm: Normal rate and regular rhythm.  Pulmonary:     Effort: Pulmonary effort is normal.     Breath sounds: No rhonchi or rales.     Comments: Distant wheeze  Musculoskeletal:        General: Normal range of motion.  Skin:    General: Skin  is warm and dry.  Neurological:     General: No focal deficit present.     Mental Status: She is alert and oriented to person, place, and time. Mental status is at baseline.  Psychiatric:        Mood and Affect: Mood normal.        Behavior: Behavior normal.        Thought Content: Thought content normal.        Judgment: Judgment normal.      Lab Results:  CBC    Component Value Date/Time  WBC 11.6 (H) 04/18/2017 1048   WBC 13.5 (H) 12/08/2015 0330   RBC 4.69 04/18/2017 1048   RBC 4.98 12/08/2015 0330   HGB 14.0 04/18/2017 1048   HCT 42.0 04/18/2017 1048   PLT 319 04/18/2017 1048   MCV 90 04/18/2017 1048   MCV 94 02/26/2011 0424   MCH 29.9 04/18/2017 1048   MCH 30.3 12/08/2015 0330   MCHC 33.3 04/18/2017 1048   MCHC 32.5 12/08/2015 0330   RDW 13.1 04/18/2017 1048   RDW 13.5 02/26/2011 0424   LYMPHSABS 1.6 02/24/2016 1230   LYMPHSABS 0.6 (L) 02/26/2011 0424   MONOABS 0.4 12/04/2015 0511   MONOABS 0.1 02/26/2011 0424   EOSABS 0.1 02/24/2016 1230   EOSABS 0.0 02/26/2011 0424   BASOSABS 0.0 02/24/2016 1230   BASOSABS 0.0 02/26/2011 0424    BMET    Component Value Date/Time   NA 141 04/18/2017 1048   NA 146 (H) 02/26/2011 0424   K 4.5 04/18/2017 1048   K 3.7 02/26/2011 0424   CL 104 04/18/2017 1048   CL 112 (H) 02/26/2011 0424   CO2 21 04/18/2017 1048   CO2 23 02/26/2011 0424   GLUCOSE 123 (H) 04/18/2017 1048   GLUCOSE 147 (H) 12/08/2015 0330   GLUCOSE 137 (H) 02/26/2011 0424   BUN 13 04/18/2017 1048   BUN 12 02/26/2011 0424   CREATININE 0.69 04/18/2017 1048   CREATININE 0.58 (L) 02/26/2011 0424   CALCIUM 9.6 04/18/2017 1048   CALCIUM 9.3 02/26/2011 0424   GFRNONAA 90 04/18/2017 1048   GFRNONAA >60 02/26/2011 0424   GFRAA 103 04/18/2017 1048   GFRAA >60 02/26/2011 0424    BNP No results found for: "BNP"  ProBNP    Component Value Date/Time   PROBNP 45 04/18/2017 1048    Imaging: No results found.   Assessment & Plan:   Moderate COPD  (chronic obstructive pulmonary disease) (HCC) - Stable interval; No recent hospitalizations or flare ups. She is compliant with her medications. Rare SABA use. CAT score 6.  - Continue Breztri, Daliresp, Singulair and prn albuterol - FU 4 months with Dr. Vassie Loll in [place] or new pulm MD in [facility]   Chronic respiratory failure with hypoxia (HCC) - Stable; Continue 2L with exertion and at bedtime  - Declined pulmonary rehab - Encourage patient stay active   Former smoker - Former smoker, quit 12 years ago - Last CT chest imaging in November 2019 without pulmonary nodules, moderate emphysema/ mild scarring - Declined referral to lung cancer screening program   Glenford Bayley, NP 02/24/2022

## 2022-02-24 NOTE — Assessment & Plan Note (Addendum)
-  Stable; Continue 2L with exertion and at bedtime  - Declined pulmonary rehab - Encourage patient stay active

## 2022-02-24 NOTE — Assessment & Plan Note (Addendum)
-  Former smoker, quit 12 years ago - Last CT chest imaging in November 2019 without pulmonary nodules, moderate emphysema/ mild scarring - Declined referral to lung cancer screening program

## 2022-03-20 DIAGNOSIS — J9601 Acute respiratory failure with hypoxia: Secondary | ICD-10-CM | POA: Diagnosis not present

## 2022-03-20 DIAGNOSIS — J410 Simple chronic bronchitis: Secondary | ICD-10-CM | POA: Diagnosis not present

## 2022-03-20 DIAGNOSIS — J449 Chronic obstructive pulmonary disease, unspecified: Secondary | ICD-10-CM | POA: Diagnosis not present

## 2022-03-27 DIAGNOSIS — H43393 Other vitreous opacities, bilateral: Secondary | ICD-10-CM | POA: Diagnosis not present

## 2022-04-18 DIAGNOSIS — J410 Simple chronic bronchitis: Secondary | ICD-10-CM | POA: Diagnosis not present

## 2022-04-18 DIAGNOSIS — J9601 Acute respiratory failure with hypoxia: Secondary | ICD-10-CM | POA: Diagnosis not present

## 2022-04-18 DIAGNOSIS — J449 Chronic obstructive pulmonary disease, unspecified: Secondary | ICD-10-CM | POA: Diagnosis not present

## 2022-05-19 DIAGNOSIS — J410 Simple chronic bronchitis: Secondary | ICD-10-CM | POA: Diagnosis not present

## 2022-05-19 DIAGNOSIS — J449 Chronic obstructive pulmonary disease, unspecified: Secondary | ICD-10-CM | POA: Diagnosis not present

## 2022-05-19 DIAGNOSIS — J9601 Acute respiratory failure with hypoxia: Secondary | ICD-10-CM | POA: Diagnosis not present

## 2022-06-01 ENCOUNTER — Other Ambulatory Visit: Payer: Self-pay | Admitting: Primary Care

## 2022-06-18 DIAGNOSIS — J9601 Acute respiratory failure with hypoxia: Secondary | ICD-10-CM | POA: Diagnosis not present

## 2022-06-18 DIAGNOSIS — J449 Chronic obstructive pulmonary disease, unspecified: Secondary | ICD-10-CM | POA: Diagnosis not present

## 2022-06-18 DIAGNOSIS — J410 Simple chronic bronchitis: Secondary | ICD-10-CM | POA: Diagnosis not present

## 2022-06-20 DIAGNOSIS — J449 Chronic obstructive pulmonary disease, unspecified: Secondary | ICD-10-CM | POA: Diagnosis not present

## 2022-06-20 DIAGNOSIS — F32A Depression, unspecified: Secondary | ICD-10-CM | POA: Diagnosis not present

## 2022-06-20 DIAGNOSIS — Z139 Encounter for screening, unspecified: Secondary | ICD-10-CM | POA: Diagnosis not present

## 2022-06-20 DIAGNOSIS — I2510 Atherosclerotic heart disease of native coronary artery without angina pectoris: Secondary | ICD-10-CM | POA: Diagnosis not present

## 2022-06-29 ENCOUNTER — Encounter (HOSPITAL_BASED_OUTPATIENT_CLINIC_OR_DEPARTMENT_OTHER): Payer: Self-pay | Admitting: Pulmonary Disease

## 2022-06-29 ENCOUNTER — Ambulatory Visit (HOSPITAL_BASED_OUTPATIENT_CLINIC_OR_DEPARTMENT_OTHER): Payer: Medicare Other | Admitting: Pulmonary Disease

## 2022-06-29 VITALS — BP 130/70 | HR 78 | Temp 98.2°F | Ht 62.0 in | Wt 183.0 lb

## 2022-06-29 DIAGNOSIS — J449 Chronic obstructive pulmonary disease, unspecified: Secondary | ICD-10-CM | POA: Diagnosis not present

## 2022-06-29 DIAGNOSIS — J441 Chronic obstructive pulmonary disease with (acute) exacerbation: Secondary | ICD-10-CM | POA: Diagnosis not present

## 2022-06-29 DIAGNOSIS — J9611 Chronic respiratory failure with hypoxia: Secondary | ICD-10-CM

## 2022-06-29 NOTE — Progress Notes (Signed)
   Subjective:    Patient ID: Erin Morrison, female    DOB: [birth year], 72 y.o.   MRN: [id number]  HPI  72 yo ex-smoker for follow-up of moderate COPD and chronic respiratory failure on O2 since 2017 smoker 1.5-2ppd x 30+ years (quit 2012 ) -dyspnea & hypoxia out of proportion  to her lung function    PMH -noncritical CAD Ventral hernia surgery was felt to be high risk   She used to be on a regimen of Stiolto 2 puffs daily + Pulmicort nebulizer twice daily and Daliresp before switching to [employer].    65-month follow-up visit. Accompanied by her husband today who corroborates history.  Last office visit with me was 06/2021 and we gave her a longer course of prednisone. She had a follow-up visit with APP in 01/2022 which I reviewed She has lost 20 pounds.  Breathing is stable. She remains on 2 L pulse  Significant tests/ events reviewed   03/2020 Oxygen saturation dropped to 87% on first lap and recovered to 92% on 2 L pulse   HRCT 11/2017 moderate emphysema, mild scarring at lung bases, postinflammatory, no ILD   PFTs 06/2021 moderate airway obstruction, ratio 60, FEV1 61%, FVC 76%, no bronchodilator response, TLC 128% consistent with hyperinflation, DLCO 11.2/60%   PFTs 03/2020 ratio 63, FEV1 65%, TLC 100%, DLCO 83% -Stable compared to 2018   PFT 03/2016: FVC 2.28 L (80%),FEV1 1.49 L (69%), ratio 66 sig bronchodilator response NO DLCO 07/2015: FVC 2.34 L (82%) FEV1 1.46 L (67%) ratio 63 sig bronchodilator response DLCO uncorrected 62%   Alpha-1 antitrypsin: MM (169)  Review of Systems neg for any significant sore throat, dysphagia, itching, sneezing, nasal congestion or excess/ purulent secretions, fever, chills, sweats, unintended wt loss, pleuritic or exertional cp, hempoptysis, orthopnea pnd or change in chronic leg swelling. Also denies presyncope, palpitations, heartburn, abdominal pain, nausea, vomiting, diarrhea or change in bowel or urinary habits, dysuria,hematuria, rash,  arthralgias, visual complaints, headache, numbness weakness or ataxia.     Objective:   Physical Exam  Gen. Pleasant, obese, in no distress ENT - no lesions, no post nasal drip Neck: No JVD, no thyromegaly, no carotid bruits Lungs: no use of accessory muscles, no dullness to percussion, decreased without rales or rhonchi  Cardiovascular: Rhythm regular, heart sounds  normal, no murmurs or gallops, no peripheral edema Musculoskeletal: No deformities, no cyanosis or clubbing , no tremors       Assessment & Plan:

## 2022-06-29 NOTE — Assessment & Plan Note (Signed)
I offered her lung cancer screening program but she would "rather not know".  We discussed risks and benefits

## 2022-06-29 NOTE — Assessment & Plan Note (Signed)
Continue 2 L pulse and 2 L during sleep

## 2022-06-29 NOTE — Assessment & Plan Note (Signed)
Continue on Breztri. We discussed action plan for COPD and signs and symptoms of COPD exacerbation. She will use generic albuterol for rescue

## 2022-06-29 NOTE — Patient Instructions (Signed)
Refills on Breztri as needed.  You will qualify for lung cancer screening program

## 2022-07-19 DIAGNOSIS — J449 Chronic obstructive pulmonary disease, unspecified: Secondary | ICD-10-CM | POA: Diagnosis not present

## 2022-07-19 DIAGNOSIS — J410 Simple chronic bronchitis: Secondary | ICD-10-CM | POA: Diagnosis not present

## 2022-07-19 DIAGNOSIS — J9601 Acute respiratory failure with hypoxia: Secondary | ICD-10-CM | POA: Diagnosis not present

## 2022-08-18 DIAGNOSIS — J9601 Acute respiratory failure with hypoxia: Secondary | ICD-10-CM | POA: Diagnosis not present

## 2022-08-18 DIAGNOSIS — J449 Chronic obstructive pulmonary disease, unspecified: Secondary | ICD-10-CM | POA: Diagnosis not present

## 2022-08-18 DIAGNOSIS — J410 Simple chronic bronchitis: Secondary | ICD-10-CM | POA: Diagnosis not present

## 2022-09-18 DIAGNOSIS — J9601 Acute respiratory failure with hypoxia: Secondary | ICD-10-CM | POA: Diagnosis not present

## 2022-09-18 DIAGNOSIS — J410 Simple chronic bronchitis: Secondary | ICD-10-CM | POA: Diagnosis not present

## 2022-09-18 DIAGNOSIS — J449 Chronic obstructive pulmonary disease, unspecified: Secondary | ICD-10-CM | POA: Diagnosis not present

## 2022-09-22 ENCOUNTER — Telehealth: Payer: Self-pay | Admitting: Pulmonary Disease

## 2022-09-22 NOTE — Telephone Encounter (Signed)
Patient's husband is calling. Patient needs a copy of prescription for her oxygen per her insurance. She needs the copy so that it can be covered by her insurance. Please call and advise [phone].

## 2022-09-26 NOTE — Telephone Encounter (Signed)
Printed out and put up front. I have LMOM with husband that it is ready for pick up.

## 2022-10-19 DIAGNOSIS — J9601 Acute respiratory failure with hypoxia: Secondary | ICD-10-CM | POA: Diagnosis not present

## 2022-10-19 DIAGNOSIS — J449 Chronic obstructive pulmonary disease, unspecified: Secondary | ICD-10-CM | POA: Diagnosis not present

## 2022-10-19 DIAGNOSIS — J410 Simple chronic bronchitis: Secondary | ICD-10-CM | POA: Diagnosis not present

## 2022-10-24 ENCOUNTER — Other Ambulatory Visit: Payer: Self-pay | Admitting: Primary Care

## 2022-10-25 ENCOUNTER — Telehealth: Payer: Self-pay | Admitting: Pulmonary Disease

## 2022-10-25 NOTE — Telephone Encounter (Signed)
montelukast (SINGULAIR) 10 MG tablet [[id number]]    Please call in refill. She has already called [facility] is [facility] [place]

## 2022-10-30 NOTE — Telephone Encounter (Signed)
Singular sent on 10/26/22 per chart.

## 2022-11-14 ENCOUNTER — Telehealth: Payer: Self-pay | Admitting: Pulmonary Disease

## 2022-11-14 DIAGNOSIS — J9611 Chronic respiratory failure with hypoxia: Secondary | ICD-10-CM

## 2022-11-14 NOTE — Telephone Encounter (Signed)
Patient was seen 06/29/22 and there hasn't been an 02 order placed since 2018. We need new 02 order to send [employer]

## 2022-11-14 NOTE — Telephone Encounter (Signed)
Erin Morrison states needs order for oxygen. Erin Morrison phone number is [phone].

## 2022-11-17 NOTE — Telephone Encounter (Signed)
New order placed

## 2022-11-17 NOTE — Telephone Encounter (Signed)
Sorry.  Order placed.

## 2022-11-18 DIAGNOSIS — J9601 Acute respiratory failure with hypoxia: Secondary | ICD-10-CM | POA: Diagnosis not present

## 2022-11-18 DIAGNOSIS — J410 Simple chronic bronchitis: Secondary | ICD-10-CM | POA: Diagnosis not present

## 2022-11-18 DIAGNOSIS — J449 Chronic obstructive pulmonary disease, unspecified: Secondary | ICD-10-CM | POA: Diagnosis not present

## 2022-12-19 DIAGNOSIS — J449 Chronic obstructive pulmonary disease, unspecified: Secondary | ICD-10-CM | POA: Diagnosis not present

## 2022-12-19 DIAGNOSIS — J9601 Acute respiratory failure with hypoxia: Secondary | ICD-10-CM | POA: Diagnosis not present

## 2022-12-19 DIAGNOSIS — J410 Simple chronic bronchitis: Secondary | ICD-10-CM | POA: Diagnosis not present

## 2022-12-29 ENCOUNTER — Ambulatory Visit: Payer: Medicare Other | Admitting: Primary Care

## 2023-01-01 ENCOUNTER — Ambulatory Visit: Payer: Medicare Other | Admitting: Primary Care

## 2023-01-01 ENCOUNTER — Encounter: Payer: Self-pay | Admitting: Primary Care

## 2023-01-01 DIAGNOSIS — J449 Chronic obstructive pulmonary disease, unspecified: Secondary | ICD-10-CM

## 2023-01-01 MED ORDER — ROFLUMILAST 500 MCG PO TABS: 500.0000 ug | ORAL_TABLET | Freq: Every day | ORAL | 3 refills | Status: DC

## 2023-01-01 MED ORDER — BREZTRI AEROSPHERE 160-9-4.8 MCG/ACT IN AERO: 2.0000 | INHALATION_SPRAY | Freq: Two times a day (BID) | RESPIRATORY_TRACT | 11 refills | Status: DC

## 2023-01-01 MED ORDER — ALBUTEROL SULFATE HFA 108 (90 BASE) MCG/ACT IN AERS: 2.0000 | INHALATION_SPRAY | Freq: Four times a day (QID) | RESPIRATORY_TRACT | 3 refills | Status: DC | PRN

## 2023-01-01 NOTE — Patient Instructions (Addendum)
No changes today Continue Inhalers and all medications as directed Refills provided Recommend you get RSV vaccine through your pharmacy  Follow-up 6 months with Dr. Vassie Loll or sooner if needed

## 2023-01-01 NOTE — Progress Notes (Signed)
@Patient  ID: Levonne Hubert, female    DOB: 1950/10/03, 72 y.o.   MRN: [id number]  No chief complaint on file.   Referring provider: Lonie Peak, Cordelia Poche  HPI: 72 year old female, former smoker 1.5-2ppd x 30+ years (quit 2012). PMH moderate COPD, chronic allergic rhinitis, elevated diaphragm, chronic resp failure, GERD, HTN, CAD, NSTEMI. Patient of Dr. Vassie Loll. She is maintained on [employer] and Daliresp .   Previous [place] encounter: 7/30/2021Clent Ridges, NP Patient presents today for 6 month follow-up. Doing fairly well, no recent exacerbations in the last 6 months or hospital stays. She is currently taking STIOLTO and Pulmicort. Husband reports this cost them $300 dollars a month. Recently change insurance plans. They received a letter stating that Daliresp will not be covered by [facility] medicare advantage. She has two weeks left of Daliresp medication. She has noticed a big improvement in COPD symptoms since starting medication and she has had a decrease in COPD exacerbations and hospitalizations since being on it.   03/25/20- Dr. Vassie Loll She is on Stiolto 2 puffs daily + Pulmicort nebulizer twice daily and Daliresp. Prednisone was tapered to off She continues to complain of significant shortness of breath.  She has a sedentary lifestyle, she is short of breath walking around the house.  She is using oxygen 24/7 She has gained 20 pounds from 190 in 2019 to her current weight of 210 pounds  Denies pedal edema, orthopnea or proximal nocturnal dyspnea.  No hospitalization in the past year   Oxygen saturation dropped to 87% on first lab and recovered to 92% on 2 L pulse   07/23/2020 Patient presents today for 4 month follow-up. She is at her baseline, no acute respiratory complaints today. No significant changes since last visit. She continues to experience moderate dyspnea with activity. She is using stationary bike and walking. She is also working on weight loss. She is  currently on Stiolto + Pulmicort but is wondering if she should try Breztri that Dr. Vassie Loll had previously recommended. She continues to take Daliresp. She is on 2-4L oxygen 24/7; she has had no new O2 requirements. She is having consult for hernia repair in the upcoming week. Denies cough, chest congestion, chest tightness or wheezing. CAT score 13.  11/23/20- 59-month follow-up visit She used to be on a regimen of Stiolto 2 puffs daily + Pulmicort nebulizer twice daily and Daliresp. Prednisone was tapered to off She was switched to [facility] and this seems to work better for her, she needs refills as she is out of her medication She arrives accompanied by her husband who corroborates history, on oxygen POC. Previous evaluation 06/2020 noted as preop evaluation for ventral hernia.  Surgeon was Dr. Derrell Lolling, he has decided against proceeding with surgery She admits to sedentary lifestyle, was using stationary bike but has stopped doing this Blood pressure documented low on left arm   05/24/2021 Patient presents today for 6 month follow-up. Dyspnea is worse. No acute cough. Some wheezing. She had an exacerbation in February 2023 which was treated with prednisone and never fully recovered. She has not been consistently exercising at home, reluctant to start pulmonary rehab. She is using Albuterol rescue inhaler 1-2 times a day. She remains on 3L supplemental oxygen   07/15/21- Dr. Vassie Loll 72 yo ex-smoker for follow-up of moderate COPD and chronic respiratory failure on O2 since 2017 smoker 1.5-2ppd x 30+ years (quit 2012 ) -dyspnea & hypoxia out of proportion  to her lung function  PMH -noncritical CAD  She used to be on a regimen of Stiolto 2 puffs daily + Pulmicort nebulizer twice daily and Daliresp before switching to [employer].  Ventral hernia surgery was felt to be high risk  10/07/21- Dr. Maple Hudson - Acute visit - 71 yoF last seen by Dr Vassie Loll for moderate COPD 07/15/21 -Neb albuterol, Albuterol hfa,  Dymista, Breztri, Singulair, Daliresp, Aerochamber O2 3L 24/7- has POC from [facility] Pt called 9/5 c/o productive cough, yellow, wheeze, incr SOB x 2 weeks. CXR 05/25/21- IMPRESSION: Stable emphysema pattern. No superimposed acute process by plain radiography Aortic Atherosclerosis (ICD10-I70.0). PFT 07/15/21-Moderate obstruction, no response to dilator- emphysema pattern. -----States increase sob, wheezing, productive cough with clear to yellow sputum Took prednisone in June. She reports gradually increasing dyspnea, productive cough w/o sore throat/ fever/ chills/GI/GU over past 2 weeks. Staying in home. No recognized sick exposure or acute event. She asks prednisone and indicates an antibiotic like Zpak usually helps her.  Lab tech is out today.  10/24/2021 Patient presents today for follow-up. Seen earlier this month by Dr. Maple Hudson for COPD exacerbation treated with Z-Pak and prednisone taper. Ordered for CBC with dif and IgE, not done. She is feeling back to her baseline. No longer has productive cough or wheezing. She has occasional dyspnea symptom which do not occur daily. She is complaint with Breztri, Singulair and daliresp. She wears 3L oxygen 24/7. CAT score 7.    02/24/2022  Patient presents today for 3-4 month follow-up. Hx moderate COPD/emphysema.  She is doing extremely well today without acute complaints. No recent hospitalizations or exacerbations. No recent abx or prednisone. Maintained on Breztri, Daliresp, Singulair and prn albuterol. Wearing 2L pulsed oxygen with exertion and at bedtime. Needs medication refilled today.   Started smoking age 42, quit age 39. She smoked on average 1.5 ppd. She is not intersted in lung cancer screening at this time.   06/29/22- Dr. Vassie Loll  19-month follow-up visit. Accompanied by her husband today who corroborates history.  Last office visit with me was 06/2021 and we gave her a longer course of prednisone. She had a follow-up visit with APP in  01/2022 which I reviewed She has lost 20 pounds.  Breathing is stable. She remains on 2 L pulse     01/01/2023- Interim hx  Discussed the use of AI scribe software for clinical note transcription with the patient, who gave verbal consent to proceed.  History of Present Illness   The patient, followed for COPD and emphysema, reports no significant change in her breathing since the last visit six months ago. She continues to use [employer] inhaler twice daily, Daliresp, and Singulair. She uses an Albuterol rescue inhaler once or twice a week. She denies any recent flare-ups of her breathing or need for antibiotics since the last visit.  The patient has been on oxygen therapy, using two to three liters as needed. She quit smoking at the age of 31. She also reports a significant weight loss, which she attributes to changes in her eating habits. She eats until she is full and then stops, without following a specific diet.  The patient had her flu shot and COVID booster in November. She has not yet received the one-time RSV vaccine but expresses interest in getting it. She has not been on prednisone recently. She requests refills for her current medications.      Significant tests/ events reviewed   03/2020 Oxygen saturation dropped to 87% on first lap and recovered to 92% on 2  L pulse   HRCT 11/2017 moderate emphysema, mild scarring at lung bases, postinflammatory, no ILD   PFTs 06/2021 moderate airway obstruction, ratio 60, FEV1 61%, FVC 76%, no bronchodilator response, TLC 128% consistent with hyperinflation, DLCO 11.2/60%   PFTs 03/2020 ratio 63, FEV1 65%, TLC 100%, DLCO 83% -Stable compared to 2018   PFT 03/2016: FVC 2.28 L (80%),FEV1 1.49 L (69%), ratio 66 sig bronchodilator response NO DLCO 07/2015: FVC 2.34 L (82%) FEV1 1.46 L (67%) ratio 63 sig bronchodilator response DLCO uncorrected 62%   Alpha-1 antitrypsin: MM (169)    No Known Allergies  Immunization History  Administered  Date(s) Administered   Fluad Quad(high Dose 65+) 11/25/2018, 10/29/2020, 10/24/2021   Influenza Split 10/18/2015   Influenza, High Dose Seasonal PF 10/24/2016   Influenza,inj,Quad PF,6+ Mos 09/19/2017   PFIZER(Purple Top)SARS-COV-2 Vaccination 08/26/2019, 09/16/2019   Pneumococcal Conjugate-13 01/30/2013   Pneumococcal Polysaccharide-23 02/27/2011    Past Medical History:  Diagnosis Date   Acute DVT (deep venous thrombosis) (HCC)    Allergic rhinitis    Atrophic vaginitis    COPD (chronic obstructive pulmonary disease) (HCC)    Hypertension    Low back pain    Umbilical hernia     Tobacco History: Social History   Tobacco Use  Smoking Status Former   Current packs/day: 0.00   Average packs/day: 1.5 packs/day for 46.0 years (69.0 ttl pk-yrs)   Types: Cigarettes   Start date: 01/31/1964   Quit date: 01/30/2010   Years since quitting: 12.9  Smokeless Tobacco Never   Counseling given: Not Answered   Outpatient Medications Prior to Visit  Medication Sig Dispense Refill   acetaminophen (TYLENOL) 500 MG tablet Take 500 mg by mouth 2 (two) times daily as needed for moderate pain or headache.     albuterol (PROVENTIL) (2.5 MG/3ML) 0.083% nebulizer solution Take 2.5 mg by nebulization every 6 (six) hours as needed for wheezing or shortness of breath.     ALBUTEROL SULFATE HFA IN Inhale 2 puffs into the lungs every 6 (six) hours as needed.     aspirin 81 MG tablet Take 81 mg by mouth daily.     atorvastatin (LIPITOR) 40 MG tablet TAKE 1 TABLET BY MOUTH EVERY DAY AT 6PM 15 tablet 0   Azelastine-Fluticasone 137-50 MCG/ACT SUSP Place 1 spray into the nose 2 (two) times daily. (Patient not taking: Reported on 06/29/2022) 1 Bottle 1   Budeson-Glycopyrrol-Formoterol (BREZTRI AEROSPHERE) 160-9-4.8 MCG/ACT AERO Inhale 2 puffs into the lungs in the morning and at bedtime. 10.7 each 11   escitalopram (LEXAPRO) 10 MG tablet Take 10 mg by mouth daily.     montelukast (SINGULAIR) 10 MG tablet TAKE  1 TABLET BY MOUTH EVERYDAY AT BEDTIME 90 tablet 3   nitroGLYCERIN (NITROSTAT) 0.4 MG SL tablet Place 1 tablet (0.4 mg total) under the tongue every 5 (five) minutes as needed for chest pain. 90 tablet 3   ofloxacin (OCUFLOX) 0.3 % ophthalmic solution SMARTSIG:In Eye(s) (Patient not taking: Reported on 06/29/2022)     Polyethyl Glycol-Propyl Glycol (SYSTANE OP) Apply 1-2 drops to eye daily as needed (dry eyes). (Patient not taking: Reported on 06/29/2022)     prednisoLONE acetate (PRED FORTE) 1 % ophthalmic suspension Place into the left eye. (Patient not taking: Reported on 06/29/2022)     roflumilast (DALIRESP) 500 MCG TABS tablet Take 1 tablet (500 mcg total) by mouth daily. 90 tablet 3   Spacer/Aero-Holding Chambers (AEROCHAMBER MV) inhaler Use as instructed (Patient not taking: Reported on 06/29/2022)  1 each 0   No facility-administered medications prior to visit.   Review of Systems  Review of Systems  Constitutional: Negative.   HENT: Negative.    Respiratory: Negative.    Cardiovascular: Negative.     Physical Exam  There were no vitals taken for this visit. Physical Exam Constitutional:      Appearance: Normal appearance.  HENT:     Head: Normocephalic and atraumatic.     Mouth/Throat:     Mouth: Mucous membranes are moist.     Pharynx: Oropharynx is clear.  Cardiovascular:     Rate and Rhythm: Normal rate and regular rhythm.  Pulmonary:     Effort: Pulmonary effort is normal.     Breath sounds: Normal breath sounds. No wheezing, rhonchi or rales.  Musculoskeletal:        General: Normal range of motion.  Skin:    General: Skin is warm and dry.  Neurological:     General: No focal deficit present.     Mental Status: She is alert and oriented to person, place, and time. Mental status is at baseline.  Psychiatric:        Mood and Affect: Mood normal.        Behavior: Behavior normal.        Thought Content: Thought content normal.        Judgment: Judgment normal.       Lab Results:  CBC    Component Value Date/Time   WBC 11.6 (H) 04/18/2017 1048   WBC 13.5 (H) 12/08/2015 0330   RBC 4.69 04/18/2017 1048   RBC 4.98 12/08/2015 0330   HGB 14.0 04/18/2017 1048   HCT 42.0 04/18/2017 1048   PLT 319 04/18/2017 1048   MCV 90 04/18/2017 1048   MCV 94 02/26/2011 0424   MCH 29.9 04/18/2017 1048   MCH 30.3 12/08/2015 0330   MCHC 33.3 04/18/2017 1048   MCHC 32.5 12/08/2015 0330   RDW 13.1 04/18/2017 1048   RDW 13.5 02/26/2011 0424   LYMPHSABS 1.6 02/24/2016 1230   LYMPHSABS 0.6 (L) 02/26/2011 0424   MONOABS 0.4 12/04/2015 0511   MONOABS 0.1 02/26/2011 0424   EOSABS 0.1 02/24/2016 1230   EOSABS 0.0 02/26/2011 0424   BASOSABS 0.0 02/24/2016 1230   BASOSABS 0.0 02/26/2011 0424    BMET    Component Value Date/Time   NA 141 04/18/2017 1048   NA 146 (H) 02/26/2011 0424   K 4.5 04/18/2017 1048   K 3.7 02/26/2011 0424   CL 104 04/18/2017 1048   CL 112 (H) 02/26/2011 0424   CO2 21 04/18/2017 1048   CO2 23 02/26/2011 0424   GLUCOSE 123 (H) 04/18/2017 1048   GLUCOSE 147 (H) 12/08/2015 0330   GLUCOSE 137 (H) 02/26/2011 0424   BUN 13 04/18/2017 1048   BUN 12 02/26/2011 0424   CREATININE 0.69 04/18/2017 1048   CREATININE 0.58 (L) 02/26/2011 0424   CALCIUM 9.6 04/18/2017 1048   CALCIUM 9.3 02/26/2011 0424   GFRNONAA 90 04/18/2017 1048   GFRNONAA >60 02/26/2011 0424   GFRAA 103 04/18/2017 1048   GFRAA >60 02/26/2011 0424    BNP No results found for: "BNP"  ProBNP    Component Value Date/Time   PROBNP 45 04/18/2017 1048    Imaging: No results found.   Assessment & Plan:   1. Moderate COPD (chronic obstructive pulmonary disease) (HCC) - roflumilast (DALIRESP) 500 MCG TABS tablet; Take 1 tablet (500 mcg total) by mouth daily.  Dispense: 90  tablet; Refill: 3  COPD/Emphysema Stable, no recent exacerbations. Using [employer] two puffs BID, Daliresp , and Singulair. Albuterol rescue inhaler used 1-2 times per week. -Refill  Daliresp, [employer], and Albuterol at [facility] in [place] -Consider RSV vaccine at pharmacy.  Weight Loss Continued weight loss since last visit, appears to be intentional and through healthy lifestyle changes.  -Encourage continued healthy lifestyle habits.  General Health Maintenance Received flu shot and COVID booster in November. -Continue regular follow-ups every six months, or sooner if any issues arise.      Glenford Bayley, NP 01/01/2023

## 2023-01-18 DIAGNOSIS — J9601 Acute respiratory failure with hypoxia: Secondary | ICD-10-CM | POA: Diagnosis not present

## 2023-01-18 DIAGNOSIS — J449 Chronic obstructive pulmonary disease, unspecified: Secondary | ICD-10-CM | POA: Diagnosis not present

## 2023-01-18 DIAGNOSIS — J410 Simple chronic bronchitis: Secondary | ICD-10-CM | POA: Diagnosis not present

## 2023-01-22 DIAGNOSIS — I2510 Atherosclerotic heart disease of native coronary artery without angina pectoris: Secondary | ICD-10-CM | POA: Diagnosis not present

## 2023-01-22 DIAGNOSIS — J449 Chronic obstructive pulmonary disease, unspecified: Secondary | ICD-10-CM | POA: Diagnosis not present

## 2023-01-26 DIAGNOSIS — D649 Anemia, unspecified: Secondary | ICD-10-CM | POA: Diagnosis not present

## 2023-01-26 DIAGNOSIS — Z1212 Encounter for screening for malignant neoplasm of rectum: Secondary | ICD-10-CM | POA: Diagnosis not present

## 2023-02-01 ENCOUNTER — Telehealth (HOSPITAL_BASED_OUTPATIENT_CLINIC_OR_DEPARTMENT_OTHER): Payer: Self-pay | Admitting: Pulmonary Disease

## 2023-02-01 DIAGNOSIS — J441 Chronic obstructive pulmonary disease with (acute) exacerbation: Secondary | ICD-10-CM

## 2023-02-01 MED ORDER — PREDNISONE 20 MG PO TABS: 40.0000 mg | ORAL_TABLET | Freq: Every day | ORAL | 0 refills | Status: AC

## 2023-02-01 MED ORDER — DOXYCYCLINE HYCLATE 100 MG PO TABS: 100.0000 mg | ORAL_TABLET | Freq: Two times a day (BID) | ORAL | 0 refills | Status: AC

## 2023-02-01 NOTE — Telephone Encounter (Signed)
 Will treat for AECOPD with prednisone  burst and empiric doxycycline  - rx sent to pharmacy for prednisone  40 mg daily for 5 days and doxycycline  1 tab Twice daily for 7 days. Wear sunscreen when outside while on abx. Take with food. Take prednisone  in AM. Use OTC guaifenesin [phone] mg Twice daily for cough/congestion. Use neb treatments 2-3 times a day until symptoms improve. If she fails to improve or symptoms worsen, needs to go to [facility] or ED for further evaluation. Thanks.

## 2023-02-01 NOTE — Telephone Encounter (Signed)
 Spoke with patient she states that she has a way to monitor oxygen and it was 99% when we spoke. She states there is no fever and Covid was negative a couple days ago. Please advise.

## 2023-02-01 NOTE — Telephone Encounter (Signed)
 Patient states would like to speak to the nurse wanting to have an antibiotic called in. Patients LOV was 01/01/23. Symptoms include: hard to breath, coughing up lots of phlem, sore throat, and chest hurts feels like an elephant on it and short of breath onset the last couple of days wondering if can call in an antibiotic  Pharmacy: [facility] in [place]

## 2023-02-02 ENCOUNTER — Encounter (HOSPITAL_BASED_OUTPATIENT_CLINIC_OR_DEPARTMENT_OTHER): Payer: Self-pay

## 2023-02-02 NOTE — Telephone Encounter (Signed)
 LMOM and notified med sent in [place] message.

## 2023-02-18 DIAGNOSIS — J449 Chronic obstructive pulmonary disease, unspecified: Secondary | ICD-10-CM | POA: Diagnosis not present

## 2023-02-18 DIAGNOSIS — J9601 Acute respiratory failure with hypoxia: Secondary | ICD-10-CM | POA: Diagnosis not present

## 2023-02-18 DIAGNOSIS — J410 Simple chronic bronchitis: Secondary | ICD-10-CM | POA: Diagnosis not present

## 2023-03-13 DIAGNOSIS — R634 Abnormal weight loss: Secondary | ICD-10-CM | POA: Diagnosis not present

## 2023-03-13 DIAGNOSIS — D649 Anemia, unspecified: Secondary | ICD-10-CM | POA: Diagnosis not present

## 2023-03-13 DIAGNOSIS — R000 Tachycardia, unspecified: Secondary | ICD-10-CM | POA: Diagnosis not present

## 2023-03-13 DIAGNOSIS — J9611 Chronic respiratory failure with hypoxia: Secondary | ICD-10-CM | POA: Diagnosis not present

## 2023-03-13 DIAGNOSIS — R195 Other fecal abnormalities: Secondary | ICD-10-CM | POA: Diagnosis not present

## 2023-03-14 ENCOUNTER — Encounter: Payer: Self-pay | Admitting: Physician Assistant

## 2023-03-15 ENCOUNTER — Other Ambulatory Visit: Payer: Self-pay | Admitting: Physician Assistant

## 2023-03-15 DIAGNOSIS — R634 Abnormal weight loss: Secondary | ICD-10-CM

## 2023-03-21 DIAGNOSIS — J449 Chronic obstructive pulmonary disease, unspecified: Secondary | ICD-10-CM | POA: Diagnosis not present

## 2023-03-21 DIAGNOSIS — J410 Simple chronic bronchitis: Secondary | ICD-10-CM | POA: Diagnosis not present

## 2023-03-21 DIAGNOSIS — J9601 Acute respiratory failure with hypoxia: Secondary | ICD-10-CM | POA: Diagnosis not present

## 2023-03-23 ENCOUNTER — Ambulatory Visit
Admission: RE | Admit: 2023-03-23 | Discharge: 2023-03-23 | Disposition: A | Discharge status: Home / Self Care | Payer: Medicare HMO | Source: Ambulatory Visit | Attending: Physician Assistant | Admitting: Physician Assistant

## 2023-03-23 DIAGNOSIS — I700 Atherosclerosis of aorta: Secondary | ICD-10-CM | POA: Diagnosis not present

## 2023-03-23 DIAGNOSIS — I7143 Infrarenal abdominal aortic aneurysm, without rupture: Secondary | ICD-10-CM | POA: Diagnosis not present

## 2023-03-23 DIAGNOSIS — I2510 Atherosclerotic heart disease of native coronary artery without angina pectoris: Secondary | ICD-10-CM | POA: Diagnosis not present

## 2023-03-23 DIAGNOSIS — J439 Emphysema, unspecified: Secondary | ICD-10-CM | POA: Diagnosis not present

## 2023-03-23 DIAGNOSIS — R634 Abnormal weight loss: Secondary | ICD-10-CM | POA: Diagnosis not present

## 2023-03-23 DIAGNOSIS — R590 Localized enlarged lymph nodes: Secondary | ICD-10-CM | POA: Diagnosis not present

## 2023-03-23 DIAGNOSIS — J432 Centrilobular emphysema: Secondary | ICD-10-CM | POA: Diagnosis not present

## 2023-04-16 NOTE — Progress Notes (Signed)
 PATIENT NAVIGATOR PROGRESS NOTE  Name: Erin Morrison Date: 04/16/2023 MRN: [id number]  DOB: Dec 05, 1950   Reason for visit:  Introductory Phone Call  Comments:    Called patient to let her know our office received a referral from her PCP Lonie Peak, PA.  Patient stated she is scheduled for a colonoscopy on 04/18/23 with [facility].  Informed patient that her referral to our office would be deferred until the results of her colonoscopy are received. Patient given my direct contact information and instructed to contact office if she had any questions or concerns.  Patient verbalized understanding.   Will follow-up with patient later this week after her colonoscopy has been complete.    Time spent counseling/coordinating care: 15-30 minutes

## 2023-04-18 ENCOUNTER — Other Ambulatory Visit: Payer: Self-pay | Admitting: Gastroenterology

## 2023-04-18 DIAGNOSIS — R195 Other fecal abnormalities: Secondary | ICD-10-CM | POA: Diagnosis not present

## 2023-04-18 DIAGNOSIS — R634 Abnormal weight loss: Secondary | ICD-10-CM | POA: Diagnosis not present

## 2023-04-18 DIAGNOSIS — J9601 Acute respiratory failure with hypoxia: Secondary | ICD-10-CM | POA: Diagnosis not present

## 2023-04-18 DIAGNOSIS — K625 Hemorrhage of anus and rectum: Secondary | ICD-10-CM | POA: Diagnosis not present

## 2023-04-18 DIAGNOSIS — J449 Chronic obstructive pulmonary disease, unspecified: Secondary | ICD-10-CM | POA: Diagnosis not present

## 2023-04-18 DIAGNOSIS — J410 Simple chronic bronchitis: Secondary | ICD-10-CM | POA: Diagnosis not present

## 2023-04-18 DIAGNOSIS — D649 Anemia, unspecified: Secondary | ICD-10-CM | POA: Diagnosis not present

## 2023-04-18 DIAGNOSIS — R933 Abnormal findings on diagnostic imaging of other parts of digestive tract: Secondary | ICD-10-CM | POA: Diagnosis not present

## 2023-04-18 NOTE — H&P (Signed)
 History of Present Illness This is a 73 year old patient, referred for unintentional weight loss, abnormal CT, and new onset anemia.  She also had positive hemocult cards in December.  She has not had any prior colorectal cancer screening.    CT 03/23/2023 with colon wall thickening, right side, findings suspicious for colon carcinoma, with regional lymphadenopathy suspicious for regions metastatic carcinoma.   Medical history includes COPD, oxygen dependent, GERD, DVT, allergic rhinitis, anxiety, MDD, coronary artery disease. Echo 2022, EF 60-65%.   Alarm Symptoms: Anemia: yes, new diagnosis, hgb 9.3 on most recent labs Unintentional Weight Loss: Yes, has lost about 57 lbs over the past 6 months Change in Appetite: Yes, has had a decreased appetite over the past 6 months due to nausea Change in Bowel Habits: Denies Trouble Swallowing: Denies Blood in Stool: positive hemocult, patient has seen bright red blood in stools Black Stools: Denies  Abdominal Pain: Patient is having generalized abdominal cramping Diarrhea/Constipation: Constipation, takes dulcolax N/V: nausea for the past 6 months Reflux: Denies Belching or abdominal bloating: Denies NSAID Use: Denies Blood Thinners: Denies Major medical events over past 6 months: Denies Cardiologist: N/A  Family History of Colon Cancer: Denies.  Current Medications Albuterol Sulfate (2.5 MG/3ML) 0.083% Nebulization Solution 3 mL as needed Inhalation Albuterol Sulfate HFA 108 (90 Base) MCG/ACT Aerosol Solution 2 puffs Inhalation every 6 hours as needed Atorvastatin Calcium 40 MG Tablet 1 tablet Orally Once a day Breztri Aerosphere(Budeson-Glycopyrrol-Formoterol) 160-9-4.8 MCG/ACT Aerosol 2 puffs Inhalation Twice a day Escitalopram Oxalate 10 MG Tablet 1 tablet Orally Once a day Montelukast Sodium 10 MG Tablet 1 tablet Orally Once a day Roflumilast 500 MCG Tablet 1 tablet Orally Once a day Medication List reviewed and reconciled with the  patient  Past Medical History COPD. Anxiety.  Seasonal allergies. SOB.  Surgical History No Surgical History documented.  Family History Father: deceased Mother: deceased Brother 1: alive Brother2: alive Brother 3: alive Sister 1: deceased Sister 2: alive Sister 3: alive No family history of colon cancer, colon polyps or liver disease.  Social History General:  Tobacco use  cigarettes:  Former smoker, Quit in year  2012, Tobacco history last updated  04/18/2023, Vaping  No. Alcohol: no. Recreational drug use: no.  Allergies N.K.D.A.  Hospitalization/Major Diagnostic Procedure Not within the past year 03/2023   Vital Signs Wt: 148.4, Ht: 62, BMI: 27.14, Temp: 97.9, Pulse sitting: 102, BP sitting: 92/58 RA. Examination GENERAL APPEARANCE:  Well developed, well nourished, no active distress, pleasant, no acute distress .  ABDOMEN  Bowel sounds normal, Abdomen not distended, Non-tender to palpation.  PSYCHIATRIC  Alert and oriented x3, mood and affect appear normal.   Assessments 1. Anemia - D64.9 (Primary)    2. Abnormal CT scan, colon - R93.3    3. Unintentional weight loss - R63.4    4. Bright red rectal bleeding - K62.5    5. Fecal occult blood test positive - R19.5    6. Chronic obstructive pulmonary disease (COPD) - J44.9     Treatment 1. Anemia      IMAGING: EGD (Ordered for 04/18/2023)    Notes: Discussed risk of EGD & colonoscopy including perforation, bleeding, infection. All questions regarding procedure answered today. Order placed for EGD and colonoscopy.     2. Abnormal CT scan, colon     IMAGING: Colonoscopy (Ordered for 04/18/2023)     Notes: Discussed risk of EGD & colonoscopy including perforation, bleeding, infection. All questions regarding procedure answered today. Order placed for  EGD and colonoscopy.     3. Unintentional weight loss     IMAGING: EGD (Ordered for 04/18/2023)   IMAGING: Colonoscopy (Ordered for 04/18/2023)    Notes:  Discussed risk of EGD & colonoscopy including perforation, bleeding, infection. All questions regarding procedure answered today. Order placed for EGD and colonoscopy.     4. Bright red rectal bleeding     IMAGING: Colonoscopy (Ordered for 04/18/2023)    Notes: Discussed risk of EGD & colonoscopy including perforation, bleeding, infection. All questions regarding procedure answered today. Order placed for EGD and colonoscopy.     5. Fecal occult blood test positive     IMAGING: Colonoscopy (Ordered for 04/18/2023)    Notes: Discussed risk of EGD & colonoscopy including perforation, bleeding, infection. All questions regarding procedure answered today. Order placed for EGD and colonoscopy.     6. Chronic obstructive pulmonary disease (COPD) [facility].

## 2023-04-20 ENCOUNTER — Other Ambulatory Visit: Payer: Self-pay

## 2023-04-20 ENCOUNTER — Ambulatory Visit (HOSPITAL_COMMUNITY): Admitting: Anesthesiology

## 2023-04-20 ENCOUNTER — Ambulatory Visit (HOSPITAL_COMMUNITY)
Admission: RE | Admit: 2023-04-20 | Discharge: 2023-04-20 | Disposition: A | Discharge status: Home / Self Care | Source: Ambulatory Visit | Attending: Gastroenterology | Admitting: Gastroenterology

## 2023-04-20 ENCOUNTER — Encounter (HOSPITAL_COMMUNITY): Payer: Self-pay | Admitting: Gastroenterology

## 2023-04-20 ENCOUNTER — Encounter (HOSPITAL_COMMUNITY)
Admission: RE | Disposition: A | Discharge status: Home / Self Care | Payer: Self-pay | Source: Ambulatory Visit | Attending: Gastroenterology

## 2023-04-20 DIAGNOSIS — D509 Iron deficiency anemia, unspecified: Secondary | ICD-10-CM | POA: Insufficient documentation

## 2023-04-20 DIAGNOSIS — K449 Diaphragmatic hernia without obstruction or gangrene: Secondary | ICD-10-CM | POA: Diagnosis not present

## 2023-04-20 DIAGNOSIS — K222 Esophageal obstruction: Secondary | ICD-10-CM

## 2023-04-20 DIAGNOSIS — D123 Benign neoplasm of transverse colon: Secondary | ICD-10-CM | POA: Diagnosis not present

## 2023-04-20 DIAGNOSIS — Z9981 Dependence on supplemental oxygen: Secondary | ICD-10-CM | POA: Insufficient documentation

## 2023-04-20 DIAGNOSIS — K2970 Gastritis, unspecified, without bleeding: Secondary | ICD-10-CM | POA: Diagnosis not present

## 2023-04-20 DIAGNOSIS — K635 Polyp of colon: Secondary | ICD-10-CM | POA: Diagnosis not present

## 2023-04-20 DIAGNOSIS — K639 Disease of intestine, unspecified: Secondary | ICD-10-CM | POA: Diagnosis not present

## 2023-04-20 DIAGNOSIS — I252 Old myocardial infarction: Secondary | ICD-10-CM | POA: Insufficient documentation

## 2023-04-20 DIAGNOSIS — F419 Anxiety disorder, unspecified: Secondary | ICD-10-CM | POA: Diagnosis not present

## 2023-04-20 DIAGNOSIS — C183 Malignant neoplasm of hepatic flexure: Secondary | ICD-10-CM

## 2023-04-20 DIAGNOSIS — K2950 Unspecified chronic gastritis without bleeding: Secondary | ICD-10-CM | POA: Insufficient documentation

## 2023-04-20 DIAGNOSIS — K3189 Other diseases of stomach and duodenum: Secondary | ICD-10-CM | POA: Insufficient documentation

## 2023-04-20 DIAGNOSIS — Z87891 Personal history of nicotine dependence: Secondary | ICD-10-CM | POA: Insufficient documentation

## 2023-04-20 DIAGNOSIS — Z6827 Body mass index (BMI) 27.0-27.9, adult: Secondary | ICD-10-CM | POA: Insufficient documentation

## 2023-04-20 DIAGNOSIS — D128 Benign neoplasm of rectum: Secondary | ICD-10-CM | POA: Diagnosis not present

## 2023-04-20 DIAGNOSIS — K621 Rectal polyp: Secondary | ICD-10-CM | POA: Diagnosis not present

## 2023-04-20 DIAGNOSIS — I2510 Atherosclerotic heart disease of native coronary artery without angina pectoris: Secondary | ICD-10-CM | POA: Insufficient documentation

## 2023-04-20 DIAGNOSIS — J449 Chronic obstructive pulmonary disease, unspecified: Secondary | ICD-10-CM | POA: Insufficient documentation

## 2023-04-20 DIAGNOSIS — R634 Abnormal weight loss: Secondary | ICD-10-CM | POA: Insufficient documentation

## 2023-04-20 DIAGNOSIS — K219 Gastro-esophageal reflux disease without esophagitis: Secondary | ICD-10-CM | POA: Diagnosis not present

## 2023-04-20 DIAGNOSIS — C187 Malignant neoplasm of sigmoid colon: Secondary | ICD-10-CM | POA: Insufficient documentation

## 2023-04-20 DIAGNOSIS — Z86718 Personal history of other venous thrombosis and embolism: Secondary | ICD-10-CM | POA: Insufficient documentation

## 2023-04-20 DIAGNOSIS — C20 Malignant neoplasm of rectum: Secondary | ICD-10-CM | POA: Diagnosis not present

## 2023-04-20 DIAGNOSIS — K6389 Other specified diseases of intestine: Secondary | ICD-10-CM | POA: Diagnosis not present

## 2023-04-20 HISTORY — PX: POLYPECTOMY: SHX5525

## 2023-04-20 HISTORY — PX: COLONOSCOPY: SHX5424

## 2023-04-20 HISTORY — PX: ESOPHAGOGASTRODUODENOSCOPY: SHX5428

## 2023-04-20 HISTORY — PX: HEMOSTASIS CLIP PLACEMENT: SHX6857

## 2023-04-20 SURGERY — COLONOSCOPY
Anesthesia: Monitor Anesthesia Care

## 2023-04-20 MED ORDER — SPOT INK MARKER SYRINGE KIT
PACK | SUBMUCOSAL | Status: AC
Start: 2023-04-20 — End: ?
  Filled 2023-04-20: qty 10

## 2023-04-20 MED ORDER — PROPOFOL 500 MG/50ML IV EMUL
INTRAVENOUS | Status: DC | PRN
  Administered 2023-04-20: 100 ug/kg/min via INTRAVENOUS

## 2023-04-20 MED ORDER — SODIUM CHLORIDE 0.9 % IV SOLN: INTRAVENOUS | Status: DC | PRN

## 2023-04-20 MED ORDER — SPOT INK MARKER SYRINGE KIT
PACK | SUBMUCOSAL | Status: DC | PRN
  Administered 2023-04-20: 5 mL via SUBMUCOSAL

## 2023-04-20 MED ORDER — PROPOFOL 10 MG/ML IV BOLUS
INTRAVENOUS | Status: DC | PRN
  Administered 2023-04-20: 40 mg via INTRAVENOUS
  Administered 2023-04-20: 60 mg via INTRAVENOUS
  Administered 2023-04-20: 30 mg via INTRAVENOUS

## 2023-04-20 MED ORDER — SPOT INK MARKER SYRINGE KIT
PACK | SUBMUCOSAL | Status: DC | PRN
  Administered 2023-04-20: 3 mL via SUBMUCOSAL

## 2023-04-20 MED ORDER — PHENYLEPHRINE HCL (PRESSORS) 10 MG/ML IV SOLN
INTRAVENOUS | Status: DC | PRN
  Administered 2023-04-20: 240 ug via INTRAVENOUS
  Administered 2023-04-20 (×5): 160 ug via INTRAVENOUS
  Administered 2023-04-20: 80 ug via INTRAVENOUS

## 2023-04-20 MED ORDER — LIDOCAINE 2% (20 MG/ML) 5 ML SYRINGE
INTRAMUSCULAR | Status: DC | PRN
  Administered 2023-04-20: 60 mg via INTRAVENOUS

## 2023-04-20 NOTE — Anesthesia Preprocedure Evaluation (Addendum)
 Anesthesia Evaluation  Patient identified by MRN, date of birth, ID band Patient awake    Reviewed: Allergy & Precautions, NPO status , Patient's Chart, lab work & pertinent test results, reviewed documented beta blocker date and time   History of Anesthesia Complications Negative for: history of anesthetic complications  Airway Mallampati: III       Dental  (+) Edentulous Upper, Missing, Loose,    Pulmonary COPD,  oxygen dependent, former smoker   breath sounds clear to auscultation       Cardiovascular hypertension, + CAD and + Past MI  (-) pacemaker(-) Cardiac Defibrillator  Rhythm:Regular Rate:Normal  Grossly normal TTE 2022   Neuro/Psych    GI/Hepatic ,GERD  ,,(+) neg Cirrhosis        Endo/Other    Renal/GU Renal disease     Musculoskeletal   Abdominal   Peds  Hematology   Anesthesia Other Findings   Reproductive/Obstetrics                             Anesthesia Physical Anesthesia Plan  ASA: 3  Anesthesia Plan: MAC   Post-op Pain Management:    Induction: Intravenous  PONV Risk Score and Plan: 2 and Ondansetron  Airway Management Planned: Nasal Cannula  Additional Equipment:   Intra-op Plan:   Post-operative Plan:   Informed Consent: I have reviewed the patients History and Physical, chart, labs and discussed the procedure including the risks, benefits and alternatives for the proposed anesthesia with the patient or authorized representative who has indicated his/her understanding and acceptance.     Dental advisory given  Plan Discussed with: CRNA  Anesthesia Plan Comments:        Anesthesia Quick Evaluation

## 2023-04-20 NOTE — Anesthesia Postprocedure Evaluation (Signed)
 Anesthesia Post Note  Patient: Erin Morrison  Procedure(s) Performed: COLONOSCOPY EGD (ESOPHAGOGASTRODUODENOSCOPY) TATTOOING, USING INTRADERMAL PIGMENT POLYPECTOMY     Patient location during evaluation: PACU Anesthesia Type: MAC Level of consciousness: awake and alert Pain management: pain level controlled Vital Signs Assessment: post-procedure vital signs reviewed and stable Respiratory status: spontaneous breathing, nonlabored ventilation, respiratory function stable and patient connected to nasal cannula oxygen Cardiovascular status: stable and blood pressure returned to baseline Postop Assessment: no apparent nausea or vomiting Anesthetic complications: no   No notable events documented.  Last Vitals:  Vitals:   04/20/23 1140 04/20/23 1150  BP: (!) 118/55 (!) 116/48  Pulse: 69 65  Resp: 15 18  Temp:    SpO2: 100% 100%    Last Pain:  Vitals:   04/20/23 1150  TempSrc:   PainSc: 0-No pain                 Mariann Barter

## 2023-04-20 NOTE — Interval H&P Note (Signed)
 History and Physical Interval Note: 73 year old female with abnormal CAT scan suspicious for right-sided colonic neoplasm, blood in stool, anemia, significant unintentional weight loss and bright red blood per rectum for an EGD and colonoscopy with propofol.  04/20/2023 9:07 AM  Erin Morrison  has presented today for EGD and colonoscopy with propofol, with the diagnosis of right-sided colonic neoplasm, blood in stool, anemia, significant unintentional weight loss and bright red blood per rectum.  The various methods of treatment have been discussed with the patient and family. After consideration of risks, benefits and other options for treatment, the patient has consented to  Procedure(s): COLONOSCOPY (N/A) EGD (ESOPHAGOGASTRODUODENOSCOPY) (N/A) as a surgical intervention.  The patient's history has been reviewed, patient examined, no change in status, stable for surgery.  I have reviewed the patient's chart and labs.  Questions were answered to the patient's satisfaction.     Kerin Salen

## 2023-04-20 NOTE — Op Note (Signed)
 [facility] Patient Name: Erin Morrison Procedure Date : 04/20/2023 MRN: [id number] Attending MD: Kerin Salen , MD, [id number] Date of Birth: Nov 04, 1950 CSN: [id number] Age: 73 Admit Type: Inpatient Procedure:                Upper GI endoscopy Indications:              Iron deficiency anemia Providers:                Kerin Salen, MD, Rogue Jury, RN, Kandice Robinsons,                            Technician Referring MD:             Sharrie Rothman Medicines:                Monitored Anesthesia Care Complications:            No immediate complications. Estimated blood loss:                            Minimal. Estimated Blood Loss:     Estimated blood loss was minimal. Procedure:                Pre-Anesthesia Assessment:                           - Prior to the procedure, a History and Physical                            was performed, and patient medications and                            allergies were reviewed. The patient's tolerance of                            previous anesthesia was also reviewed. The risks                            and benefits of the procedure and the sedation                            options and risks were discussed with the patient.                            All questions were answered, and informed consent                            was obtained. Prior Anticoagulants: The patient has                            taken no anticoagulant or antiplatelet agents. ASA                            Grade Assessment: III - A patient with severe  systemic disease. After reviewing the risks and                            benefits, the patient was deemed in satisfactory                            condition to undergo the procedure.                           After obtaining informed consent, the endoscope was                            passed under direct vision. Throughout the                            procedure, the patient's  blood pressure, pulse, and                            oxygen saturations were monitored continuously. The                            [id number]-H190 ([id number]) Olympus endoscope was introduced                            through the mouth, and advanced to the second part                            of duodenum. The upper GI endoscopy was                            accomplished without difficulty. The patient                            tolerated the procedure well. Scope In: Scope Out: Findings:      The upper third of the esophagus, middle third of the esophagus and       lower third of the esophagus were normal.      A widely patent Schatzki ring was found at the gastroesophageal junction.      A 1 cm hiatal hernia was present.      A few localized small erosions with no bleeding and no stigmata of       recent bleeding were found in the gastric antrum. Biopsies were taken       with a cold forceps for Helicobacter pylori testing.      The cardia and gastric fundus were normal on retroflexion.      The examined duodenum was normal. Biopsies for histology were taken with       a cold forceps for evaluation of celiac disease. Impression:               - Normal upper third of esophagus, middle third of                            esophagus and lower third of esophagus.                           -  Widely patent Schatzki ring.                           - 1 cm hiatal hernia.                           - Erosive gastropathy with no bleeding and no                            stigmata of recent bleeding. Biopsied.                           - Normal examined duodenum. Biopsied. Moderate Sedation:      Patient did not receive moderate sedation for this procedure, but       instead received monitored anesthesia care. Recommendation:           - Patient has a contact number available for                            emergencies. The signs and symptoms of potential                            delayed  complications were discussed with the                            patient. Return to normal activities tomorrow.                            Written discharge instructions were provided to the                            patient.                           - Resume regular diet.                           - Continue present medications.                           - Await pathology results.                           - Perform a colonoscopy today. Procedure Code(s):        --- Professional ---                           [phone], Esophagogastroduodenoscopy, flexible,                            transoral; with biopsy, single or multiple Diagnosis Code(s):        --- Professional ---                           K22.2, Esophageal obstruction  K44.9, Diaphragmatic hernia without obstruction or                            gangrene                           K31.89, Other diseases of stomach and duodenum                           D50.9, Iron deficiency anemia, unspecified CPT copyright 2022 American Medical Association. All rights reserved. The codes documented in this report are preliminary and upon coder review may  be revised to meet current compliance requirements. Kerin Salen, MD 04/20/2023 11:22:14 AM This report has been signed electronically. Number of Addenda: 0

## 2023-04-20 NOTE — Transfer of Care (Signed)
 Immediate Anesthesia Transfer of Care Note  Patient: Erin Morrison  Procedure(s) Performed: COLONOSCOPY EGD (ESOPHAGOGASTRODUODENOSCOPY) TATTOOING, USING INTRADERMAL PIGMENT POLYPECTOMY  Patient Location: PACU  Anesthesia Type:MAC  Level of Consciousness: awake and sedated  Airway & Oxygen Therapy: Patient Spontanous Breathing and Patient connected to nasal cannula oxygen  Post-op Assessment: Report given to RN and Post -op Vital signs reviewed and stable  Post vital signs: Reviewed and stable  Last Vitals:  Vitals Value Taken Time  BP 117/57 04/20/23 1120  Temp    Pulse 68 04/20/23 1124  Resp 20 04/20/23 1124  SpO2 100 % 04/20/23 1124  Vitals shown include unfiled device data.  Last Pain:  Vitals:   04/20/23 0840  TempSrc: Temporal  PainSc: 0-No pain         Complications: No notable events documented.

## 2023-04-20 NOTE — Discharge Instructions (Signed)

## 2023-04-20 NOTE — Op Note (Signed)
 [facility] Patient Name: Erin Morrison Procedure Date : 04/20/2023 MRN: [id number] Attending MD: Kerin Salen , MD, [id number] Date of Birth: 1950/05/13 CSN: [id number] Age: 73 Admit Type: Inpatient Procedure:                Colonoscopy Indications:              This is the patient's first colonoscopy,                            Unexplained iron deficiency anemia, Abnormal CT of                            the GI tract, rectal bleeding Providers:                Kerin Salen, MD, Rogue Jury, RN, Kandice Robinsons,                            Technician Referring MD:             Sharrie Rothman Medicines:                Monitored Anesthesia Care Complications:            No immediate complications. Estimated blood loss:                            Minimal. Estimated Blood Loss:     Estimated blood loss was minimal. Procedure:                Pre-Anesthesia Assessment:                           - Prior to the procedure, a History and Physical                            was performed, and patient medications and                            allergies were reviewed. The patient's tolerance of                            previous anesthesia was also reviewed. The risks                            and benefits of the procedure and the sedation                            options and risks were discussed with the patient.                            All questions were answered, and informed consent                            was obtained. Prior Anticoagulants: The patient has                            taken  no anticoagulant or antiplatelet agents. ASA                            Grade Assessment: III - A patient with severe                            systemic disease. After reviewing the risks and                            benefits, the patient was deemed in satisfactory                            condition to undergo the procedure.                           - Prior to the procedure, a  History and Physical                            was performed, and patient medications and                            allergies were reviewed. The patient's tolerance of                            previous anesthesia was also reviewed. The risks                            and benefits of the procedure and the sedation                            options and risks were discussed with the patient.                            All questions were answered, and informed consent                            was obtained. Prior Anticoagulants: The patient has                            taken no anticoagulant or antiplatelet agents. ASA                            Grade Assessment: III - A patient with severe                            systemic disease. After reviewing the risks and                            benefits, the patient was deemed in satisfactory                            condition to undergo the procedure.  After obtaining informed consent, the colonoscope                            was passed under direct vision. Throughout the                            procedure, the patient's blood pressure, pulse, and                            oxygen saturations were monitored continuously. The                            [id number]-HQ190L ([id number]) Olympus colonoscope was                            introduced through the anus and advanced to the the                            hepatic flexure. The colonoscopy was technically                            difficult and complex due to a partially                            obstructing mass. The patient tolerated the                            procedure well. The quality of the bowel                            preparation was fair. The rectum was photographed. Scope In: 10:28:12 AM Scope Out: 11:13:00 AM Total Procedure Duration: 0 hours 44 minutes 48 seconds  Findings:      The perianal and digital rectal examinations were normal.       Multiple large-mouthed diverticula were found in the sigmoid colon.      A frond-like/villous, fungating, infiltrative, sessile, submucosal and       ulcerated partially obstructing mass was found at the hepatic flexure.       The mass was circumferential.      The scope could not be advanced beyond the distal end of the mass, so       true size of the mass could not be determined.      This was biopsied with a cold forceps for histology. Area was tattooed       with an injection of 5 mL of Spot (carbon black).      Three sessile polyps were found in the transverse colon. The polyps were       6 to 9 mm in size. These polyps were removed with a cold snare.       Resection and retrieval were complete.      A greater than 50 mm polyp was found in the rectum at 15 cm from anus.       The polyp was semi-sessile. The polyp was removed with a piecemeal       technique using a hot snare at 20 watts. Resection and retrieval were  complete. One hemostatic clip was successfully placed (MR conditional).       Area was tattooed with an injection of 3 mL of Spot (carbon black).      A 20 mm polyp was found in the rectum, 10 cm from the anus. The polyp       was sessile. The polyp was removed with a piecemeal technique using a       hot snare at 20 watts. Resection and retrieval were complete. One       hemostatic clip was successfully placed (MR conditional).      Non-bleeding internal hemorrhoids were found during retroflexion. Impression:               - Preparation of the colon was fair.                           - Diverticulosis in the sigmoid colon.                           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 Biopsied. Tattooed.                           - Three 6 to 9 mm polyps in the transverse colon,                            removed with a cold snare. Resected and retrieved.                           - One greater than 50 mm polyp  in the rectum(15 cm                            from anus), removed piecemeal using a hot snare.                            Resected and retrieved. Clip (MR conditional) was                            placed. Tattooed.                           - One 20 mm polyp in the rectum(10 cm from anus),                            removed piecemeal using a hot snare. Resected and                            retrieved. Clip (MR conditional) was placed.                           - Non-bleeding internal hemorrhoids. Moderate Sedation:      Patient did not receive moderate sedation for this procedure, but       instead received monitored anesthesia care. Recommendation:           -  Patient has a contact number available for                            emergencies. The signs and symptoms of potential                            delayed complications were discussed with the                            patient. Return to normal activities tomorrow.                            Written discharge instructions were provided to the                            patient.                           - Resume regular diet.                           - Continue present medications.                           - Await pathology results.                           - Patient will likely need surgical evaluation for                            right hemicolectomy and oncology evaluation as soon                            as pathology results are available. Procedure Code(s):        --- Professional ---                           [phone], 52, Colonoscopy, flexible; with removal of                            tumor(s), polyp(s), or other lesion(s) by snare                            technique                           45380, 59,52, Colonoscopy, flexible; with biopsy,                            single or multiple                           45381, 52, Colonoscopy, flexible; with directed                            submucosal injection(s),  any substance Diagnosis Code(s):        ---  Professional ---                           K64.8, Other hemorrhoids                           C18.3, Malignant neoplasm of hepatic flexure                           K56.690, Other partial intestinal obstruction                           D12.3, Benign neoplasm of transverse colon (hepatic                            flexure or splenic flexure)                           D12.8, Benign neoplasm of rectum                           D50.9, Iron deficiency anemia, unspecified                           K57.30, Diverticulosis of large intestine without                            perforation or abscess without bleeding                           R93.3, Abnormal findings on diagnostic imaging of                            other parts of digestive tract CPT copyright 2022 American Medical Association. All rights reserved. The codes documented in this report are preliminary and upon coder review may  be revised to meet current compliance requirements. Kerin Salen, MD 04/20/2023 11:31:24 AM This report has been signed electronically. Number of Addenda: 0

## 2023-04-20 NOTE — Progress Notes (Signed)
 PATIENT NAVIGATOR PROGRESS NOTE  Name: Erin Morrison Date: 04/20/2023 MRN: [id number]  DOB: [birth year]   Reason for visit:  Follow-up  Comments:   Patient underwent colonoscopy this morning, pathology still pending. Received call from [place] at [facility] in [place] stating patient was currently in their office to see Lonie Peak, PA.  Office wanted to know if patient could be scheduled for initial oncology appointment. Patient will be scheduled on 04/25/23 at 2pm with Vincent Gros, NP.  Informed office that our schedulers will call patient to confirm appointment date and time.  Per Colin Mulders, their office will put in a referral for surgical oncologist.  Asked office to let patient know to contact me if they had any further questions or concerns.  Will follow-up with patient when she is in office next week.   Time spent counseling/coordinating care: 30-45 minutes

## 2023-04-22 ENCOUNTER — Encounter (HOSPITAL_COMMUNITY): Payer: Self-pay | Admitting: Gastroenterology

## 2023-04-23 ENCOUNTER — Telehealth: Payer: Self-pay

## 2023-04-23 NOTE — Telephone Encounter (Signed)
 Opened in error

## 2023-04-24 LAB — SURGICAL PATHOLOGY

## 2023-04-24 NOTE — Progress Notes (Unsigned)
 [facility]  [phone]   HEMATOLOGY ONCOLOGY INPATIENT CONSULTATION   Erin Morrison  DOB: 1950/05/03  MR#: [id number]  CSN#: [id number]    Requesting Physician: [facility]  Patient Care Team: Lonie Peak, Cordelia Poche as PCP - General (Physician Assistant) Lyn Records, MD (Inactive) as PCP - Cardiology (Cardiology) Nonah Mattes, RN as Oncology Nurse Navigator  Reason for consult:   History of present illness:     MEDICAL HISTORY:  Past Medical History:  Diagnosis Date   Acute DVT (deep venous thrombosis) (HCC)    Allergic rhinitis    Atrophic vaginitis    COPD (chronic obstructive pulmonary disease) (HCC)    Hypertension    Low back pain    Umbilical hernia     SURGICAL HISTORY: Past Surgical History:  Procedure Laterality Date   arthroscopic knee surgery Right    CARDIAC CATHETERIZATION N/A 03/03/2016   Procedure: Right/Left Heart Cath and Coronary Angiography;  Surgeon: Lyn Records, MD;  Location: [facility] INVASIVE CV LAB;  Service: Cardiovascular;  Laterality: N/A;   COLONOSCOPY N/A 04/20/2023   Procedure: COLONOSCOPY;  Surgeon: Kerin Salen, MD;  Location: [facility] ENDOSCOPY;  Service: Gastroenterology;  Laterality: N/A;   ESOPHAGOGASTRODUODENOSCOPY N/A 04/20/2023   Procedure: EGD (ESOPHAGOGASTRODUODENOSCOPY);  Surgeon: Kerin Salen, MD;  Location: [facility] ENDOSCOPY;  Service: Gastroenterology;  Laterality: N/A;   HEMOSTASIS CLIP PLACEMENT  04/20/2023   Procedure: CONTROL OF HEMORRHAGE, GI TRACT, ENDOSCOPIC, BY CLIPPING OR OVERSEWING;  Surgeon: Kerin Salen, MD;  Location: [facility] ENDOSCOPY;  Service: Gastroenterology;;   POLYPECTOMY  04/20/2023   Procedure: POLYPECTOMY;  Surgeon: Kerin Salen, MD;  Location: [facility] ENDOSCOPY;  Service: Gastroenterology;;   TUBAL LIGATION      SOCIAL HISTORY: Social History   Socioeconomic History   Marital status: Married    Spouse name: Not on file   Number of children: Not on file   Years of education: Not on  file   Highest education level: Not on file  Occupational History   Occupation: Retired  Tobacco Use   Smoking status: Former    Current packs/day: 0.00    Average packs/day: 1.5 packs/day for 46.0 years (69.0 ttl pk-yrs)    Types: Cigarettes    Start date: 01/31/1964    Quit date: 01/30/2010    Years since quitting: 13.2   Smokeless tobacco: Never  Substance and Sexual Activity   Alcohol use: No    Alcohol/week: 0.0 standard drinks of alcohol   Drug use: No   Sexual activity: Never  Other Topics Concern   Not on file  Social History Narrative   [facility]:   Originally from [place]. Moved to [place] in 1986. Has traveled to [facility]. 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 Has an outside dog. No bird or mold exposure. She does have a musty smell in her bedroom.    Social Drivers of Corporate investment banker Strain: Not on file  Food Insecurity: Not on file  Transportation Needs: Not on file  Physical Activity: Not on file  Stress: Not on file  Social Connections: Not on file  Intimate Partner Violence: Not on file    FAMILY HISTORY: Family History  Problem Relation Age of Onset   Emphysema Mother    Cirrhosis Father     ALLERGIES:  has no known allergies.  MEDICATIONS:  Current Outpatient Medications  Medication Sig Dispense Refill   acetaminophen (TYLENOL) 500 MG tablet Take 500  mg by mouth 2 (two) times daily as needed for moderate pain or headache.     albuterol (PROVENTIL) (2.5 MG/3ML) 0.083% nebulizer solution Take 2.5 mg by nebulization every 6 (six) hours as needed for wheezing or shortness of breath.     albuterol (VENTOLIN HFA) 108 (90 Base) MCG/ACT inhaler Inhale 2 puffs into the lungs every 6 (six) hours as needed for wheezing or shortness of breath. 18 g 3   aspirin 81 MG tablet Take 81 mg by mouth daily.     atorvastatin (LIPITOR) 40 MG tablet TAKE 1 TABLET BY MOUTH EVERY DAY AT 6PM 15 tablet 0    Budeson-Glycopyrrol-Formoterol (BREZTRI AEROSPHERE) 160-9-4.8 MCG/ACT AERO Inhale 2 puffs into the lungs in the morning and at bedtime. 10.7 each 11   escitalopram (LEXAPRO) 10 MG tablet Take 10 mg by mouth daily.     montelukast (SINGULAIR) 10 MG tablet TAKE 1 TABLET BY MOUTH EVERYDAY AT BEDTIME 90 tablet 3   roflumilast (DALIRESP) 500 MCG TABS tablet Take 1 tablet (500 mcg total) by mouth daily. 90 tablet 3   No current facility-administered medications for this visit.    REVIEW OF SYSTEMS:   Constitutional: Denies fevers, chills or abnormal night sweats Eyes: Denies blurriness of vision, double vision or watery eyes Ears, nose, mouth, throat, and face: Denies mucositis or sore throat Respiratory: Denies cough, dyspnea or wheezes Cardiovascular: Denies palpitation, chest discomfort or lower extremity swelling Gastrointestinal:  Denies nausea, heartburn or change in bowel habits Skin: Denies abnormal skin rashes Lymphatics: Denies new lymphadenopathy or easy bruising Neurological:Denies numbness, tingling or new weaknesses Behavioral/Psych: Mood is stable, no new changes  All other systems were reviewed with the patient and are negative.  PHYSICAL EXAMINATION: ECOG PERFORMANCE STATUS: {CHL ONC ECOG PS:[phone]}  There were no vitals filed for this visit. There were no vitals filed for this visit.  GENERAL:alert, no distress and comfortable SKIN: skin color, texture, turgor are normal, no rashes or significant lesions EYES: normal, conjunctiva are pink and non-injected, sclera clear OROPHARYNX:no exudate, no erythema and lips, buccal mucosa, and tongue normal  NECK: supple, thyroid normal size, non-tender, without nodularity LYMPH:  no palpable lymphadenopathy in the cervical, axillary or inguinal LUNGS: clear to auscultation and percussion with normal breathing effort HEART: regular rate & rhythm and no murmurs and no lower extremity edema ABDOMEN:abdomen soft, non-tender and  normal bowel sounds Musculoskeletal:no cyanosis of digits and no clubbing  PSYCH: alert & oriented x 3 with fluent speech NEURO: no focal motor/sensory deficits  LABORATORY DATA:  I have reviewed the data as listed Lab Results  Component Value Date   WBC 11.6 (H) 04/18/2017   HGB 14.0 04/18/2017   HCT 42.0 04/18/2017   MCV 90 04/18/2017   PLT 319 04/18/2017   No results for input(s): "NA", "K", "CL", "CO2", "GLUCOSE", "BUN", "CREATININE", "CALCIUM", "GFRNONAA", "GFRAA", "PROT", "ALBUMIN", "AST", "ALT", "ALKPHOS", "BILITOT", "BILIDIR", "IBILI" in the last 8760 hours.  RADIOGRAPHIC STUDIES: I have personally reviewed the radiological images as listed and agreed with the findings in the report. No results found.  ASSESSMENT & PLAN:  ***  Recommendations:   All questions were answered. The patient knows to call the clinic with any problems, questions or concerns.      Carlean Jews, NP 04/24/2023 5:19 PM

## 2023-04-25 ENCOUNTER — Inpatient Hospital Stay: Attending: Nurse Practitioner | Admitting: Nurse Practitioner

## 2023-04-25 ENCOUNTER — Inpatient Hospital Stay

## 2023-04-25 ENCOUNTER — Other Ambulatory Visit

## 2023-04-25 VITALS — BP 112/64 | HR 106 | Temp 98.4°F | Resp 22 | Ht 62.0 in | Wt 147.1 lb

## 2023-04-25 DIAGNOSIS — D509 Iron deficiency anemia, unspecified: Secondary | ICD-10-CM | POA: Insufficient documentation

## 2023-04-25 DIAGNOSIS — C20 Malignant neoplasm of rectum: Secondary | ICD-10-CM

## 2023-04-25 DIAGNOSIS — C183 Malignant neoplasm of hepatic flexure: Secondary | ICD-10-CM | POA: Diagnosis not present

## 2023-04-25 DIAGNOSIS — K5900 Constipation, unspecified: Secondary | ICD-10-CM | POA: Diagnosis not present

## 2023-04-25 DIAGNOSIS — C19 Malignant neoplasm of rectosigmoid junction: Secondary | ICD-10-CM | POA: Insufficient documentation

## 2023-04-25 DIAGNOSIS — J449 Chronic obstructive pulmonary disease, unspecified: Secondary | ICD-10-CM | POA: Insufficient documentation

## 2023-04-25 DIAGNOSIS — Z87891 Personal history of nicotine dependence: Secondary | ICD-10-CM | POA: Diagnosis not present

## 2023-04-25 DIAGNOSIS — Z9981 Dependence on supplemental oxygen: Secondary | ICD-10-CM | POA: Insufficient documentation

## 2023-04-25 HISTORY — DX: Malignant neoplasm of rectum: C20

## 2023-04-25 HISTORY — DX: Malignant neoplasm of hepatic flexure: C18.3

## 2023-04-25 LAB — CMP (CANCER CENTER ONLY)
ALT: 8 U/L (ref 0–44)
AST: 11 U/L — ABNORMAL LOW (ref 15–41)
Albumin: 3.8 g/dL (ref 3.5–5.0)
Alkaline Phosphatase: 77 U/L (ref 38–126)
Anion gap: 8 (ref 5–15)
BUN: 9 mg/dL (ref 8–23)
CO2: 29 mmol/L (ref 22–32)
Calcium: 9.4 mg/dL (ref 8.9–10.3)
Chloride: 103 mmol/L (ref 98–111)
Creatinine: 0.48 mg/dL (ref 0.44–1.00)
GFR, Estimated: >60 mL/min (ref >60)
Glucose, Bld: 95 mg/dL (ref 70–99)
Potassium: 3.9 mmol/L (ref 3.5–5.1)
Sodium: 140 mmol/L (ref 135–145)
Total Bilirubin: 0.5 mg/dL (ref 0.0–1.2)
Total Protein: 7 g/dL (ref 6.5–8.1)

## 2023-04-25 LAB — CBC WITH DIFFERENTIAL (CANCER CENTER ONLY)
Abs Immature Granulocytes: 0.03 10*3/uL (ref 0.00–0.07)
Basophils Absolute: 0.1 10*3/uL (ref 0.0–0.1)
Basophils Relative: 1 %
Eosinophils Absolute: 0.8 10*3/uL — ABNORMAL HIGH (ref 0.0–0.5)
Eosinophils Relative: 8 %
HCT: 29.6 % — ABNORMAL LOW (ref 36.0–46.0)
Hemoglobin: 8.6 g/dL — ABNORMAL LOW (ref 12.0–15.0)
Immature Granulocytes: 0 %
Lymphocytes Relative: 19 %
Lymphs Abs: 1.9 10*3/uL (ref 0.7–4.0)
MCH: 21.9 pg — ABNORMAL LOW (ref 26.0–34.0)
MCHC: 29.1 g/dL — ABNORMAL LOW (ref 30.0–36.0)
MCV: 75.5 fL — ABNORMAL LOW (ref 80.0–100.0)
Monocytes Absolute: 0.7 10*3/uL (ref 0.1–1.0)
Monocytes Relative: 7 %
Neutro Abs: 6.8 10*3/uL (ref 1.7–7.7)
Neutrophils Relative %: 65 %
Platelet Count: 555 10*3/uL — ABNORMAL HIGH (ref 150–400)
RBC: 3.92 MIL/uL (ref 3.87–5.11)
RDW: 20.6 % — ABNORMAL HIGH (ref 11.5–15.5)
WBC Count: 10.3 10*3/uL (ref 4.0–10.5)
nRBC: 0 % (ref 0.0–0.2)

## 2023-04-25 LAB — IRON AND IRON BINDING CAPACITY (CC-WL,HP ONLY)
Iron: 26 ug/dL — ABNORMAL LOW (ref 28–170)
Saturation Ratios: 6 % — ABNORMAL LOW (ref 10.4–31.8)
TIBC: 452 ug/dL — ABNORMAL HIGH (ref 250–450)
UIBC: 426 ug/dL (ref 148–442)

## 2023-04-25 NOTE — Assessment & Plan Note (Addendum)
 The patient was referred from her primary care provider.   03/23/2023 - CT of her chest, abdomen, and pelvis, after she saw him due to a 57 pound weight loss over 4 to 5 month period of time.  CT showed marked right colonic wall thickening with mild surrounding edema, suspicious for colon cancer.  There was borderline ileocolonic mesenteric adenopathy, suspicious for nodal metastases. --Labs showed iron deficiency anemia with hemoglobin 9.0, hematocrit 31.1, iron 15, iron saturation 4%, and ferritin of 21.   04/20/2023 -  -Endoscopy showed normal esophagus with a widely patent Schatzki ring.  There is a 1 cm hiatal hernia.  She has erosive gastropathy and a normal duodenum.   --Biopsies taken from the duodenum show chronic peptic duodenitis, negative for dysplasia.  Biopsies from the stomach show chronic, inactive gastritis.  Biopsies were  negative for H. pylori and negative for dysplasia.  -Colonoscopy yielded a malignant, partially obstructing tumor at the hepatic flexure.  There were three 6 to 9 mm polyps in the transverse colon.  A 5 cm polyp in the rectum (15 cm from the anus) was removed piecemeal.  It was resected and retrieved.  A 2 cm polyp in the rectum (10 cm from the anus) was removed piecemeal.  It was resected and retrieved.  There were nonbleeding, internal hemorrhoids and diverticulosis in the sigmoid colon.   --Biopsy of the tumor at hepatic flexure was positive for invasive adenocarcinoma, moderately differentiated.  MMR is abnormal.   --Biopsies of the polyps from transverse colon showed tubular adenoma, negative for high-grade dysplasia.   --Biopsy of 5 cm polyp in the rectum, 15 cm from the anus, showed invasive adenocarcinoma, moderately differentiated.  It was arising from tubular adenoma with high-grade dysplasia.  MMR protein is preserved.   --Biopsy of 2 cm polyp, 10 cm from the anus, showed tubular adenoma, negative for high-grade dysplasia or invasive carcinoma. -Lab work done  today includes CBC, CMP, CEA, ferritin, iron and iron binding capacity. -She is scheduled to meet with colorectal surgeon, Dr. Cliffton Asters, on 04/30/2023. -Discussed with the patient and her husband that surgery is likely going to be the first step, as tumor in the hepatic flexure of the colon is nearly obstructive.  She will likely need cardiopulmonary clearance prior to surgery.  Will reach out to her pulmonologist to advise them of patient's new diagnosis and possible plan for surgery.  After surgery, she may be treated with chemotherapy and/or immunotherapy.  Will send tissue from tumor sample from hepatic flexure of colon for MSI testing to evaluate for targetable genetic mutations. --Plan to discuss patient's case during multidisciplinary tumor board meeting on 05/02/2023.  At that time, we will contact patient and discuss more definitive plan regarding surgery, chemotherapy, immunotherapy, and other available treatments such as radiation. -The patient understands that part of their treatment will include nutritionist, surgical oncologist, medical oncologist, social work, radiology oncologist, and support staff.  She and her husband voiced understanding and agree to move forward today.

## 2023-04-25 NOTE — Progress Notes (Signed)
 PATIENT NAVIGATOR PROGRESS NOTE  Name: Erin Morrison Date: 04/25/2023 MRN: [id number]  DOB: 1950/03/17   Reason for visit:  New Patient Appt with Vincent Gros, NP  Comments:  Patient seen in office for initial consult with Vincent Gros, NP and Dr. Malachy Mood.  Patient has an appointment scheduled with Dr. Marin Olp at [facility] on 04/30/23 at 9:40am to discuss surgical options.  Patient voiced concern on if she would be a candidate for surgery with her history of COPD and her requirement for supplemental oxygen use.  Will forward a copy of her office visit note from today to Dr. Cliffton Asters and also her pulmonologist Dr. Cyril Mourning to make them aware.   Patient was given a Journey's binder with information regarding her diagnosis, and also my contact information.  Patient was advised to contact office if she had any questions or concerns after her visit today.   Informed patient her case will be discussed at our next GI conference, and that our office would be in touch with her next week to arrange for follow-up.  Patient verbalized understanding and was agreeable to plan.  Will follow-up with patient next week.     Time spent counseling/coordinating care: > 60 minutes

## 2023-04-25 NOTE — Progress Notes (Unsigned)
 Opened in error

## 2023-04-25 NOTE — Assessment & Plan Note (Signed)
 A 5 cm polyp in the rectum (15 cm from the anus) was removed piecemeal.  It was resected and retrieved. -Biopsy of 5 cm polyp in the rectum, 15 cm from the anus, showed invasive adenocarcinoma, moderately differentiated.  It was arising from tubular adenoma with high-grade dysplasia.  MMR protein is preserved.  A 2 cm polyp in the rectum (10 cm from the anus) was removed piecemeal.  It was resected and retrieved.   -Biopsy of 2 cm polyp, 10 cm from the anus, showed tubular adenoma, negative for high-grade dysplasia or invasive carcinoma.

## 2023-04-26 ENCOUNTER — Telehealth: Payer: Self-pay | Admitting: Nurse Practitioner

## 2023-04-26 LAB — FERRITIN: Ferritin: 12 ng/mL (ref 11–307)

## 2023-04-26 LAB — CEA (ACCESS): CEA (CHCC): 273.1 ng/mL — ABNORMAL HIGH (ref 0.00–5.00)

## 2023-04-26 NOTE — Telephone Encounter (Signed)
 Not Available - Charles informed me that Erin Morrison was unavailable at this time.

## 2023-04-27 ENCOUNTER — Telehealth: Payer: Self-pay | Admitting: Nurse Practitioner

## 2023-04-27 NOTE — Telephone Encounter (Signed)
 I spoke with Dois Davenport, patient was unaware of why she needs to return. Dawn advised I speak with Leonette Most but Leonette Most is not available.

## 2023-04-27 NOTE — Progress Notes (Signed)
 Message sent to patient's pulmonologist Dr. Cyril Mourning to inform him on patient's most recent visit with Vincent Gros, NP and Dr. Malachy Mood on 04/25/23.  Patient's records are viewable by pulmonologist's office via Epic. This was done to aid in coordination and continuation of patient's care.

## 2023-04-30 ENCOUNTER — Other Ambulatory Visit: Payer: Self-pay | Admitting: Surgery

## 2023-04-30 ENCOUNTER — Telehealth: Payer: Self-pay | Admitting: *Deleted

## 2023-04-30 ENCOUNTER — Telehealth: Payer: Self-pay | Admitting: Pulmonary Disease

## 2023-04-30 DIAGNOSIS — C189 Malignant neoplasm of colon, unspecified: Secondary | ICD-10-CM | POA: Diagnosis not present

## 2023-04-30 DIAGNOSIS — I214 Non-ST elevation (NSTEMI) myocardial infarction: Secondary | ICD-10-CM | POA: Diagnosis not present

## 2023-04-30 NOTE — Telephone Encounter (Signed)
   Name: Erin Morrison  DOB: 1950-08-18  MRN: [id number]  Primary Cardiologist: Lesleigh Noe, MD (Inactive)  Chart reviewed as part of pre-operative protocol coverage. Because of [name]'s past medical history and time since last visit, she will require a follow-up in-office visit in order to better assess preoperative cardiovascular risk.  Pre-op covering staff: - Please schedule appointment and call patient to inform them. If patient already had an upcoming appointment within acceptable timeframe, please add "pre-op clearance" to the appointment notes so provider is aware. - Please contact requesting surgeon's office via preferred method (i.e, phone, fax) to inform them of need for appointment prior to surgery.  Patient needs to re-establish with our practice. Decisions on an ASA hold can be made at the time of her in-person appointment.   Sharlene Dory, PA-C  04/30/2023, 1:38 PM

## 2023-04-30 NOTE — Telephone Encounter (Signed)
 Please offer an appt @ 3-30 for surgical clearance

## 2023-04-30 NOTE — Telephone Encounter (Signed)
 Pt hadn't been seen since 2019, will require a new pt referral and appointment.  Will route back to [facility] to make them aware to send a new pt referral so we can get pt scheduled.

## 2023-04-30 NOTE — Telephone Encounter (Signed)
   Pre-operative Risk Assessment    Patient Name: Erin Morrison  DOB: 1950-11-12 MRN: [id number]   Date of last office visit: 04/18/2017 Date of next office visit:   Request for Surgical Clearance    Procedure:   Laparoscopic right hemicolectomy,primary repair if 5cm ventral hernia surgery  Date of Surgery:  Clearance TBD                                 Surgeon:  Dr. Marin Olp Surgeon's Group or Practice Name:  [facility] Phone number:  [phone] Fax number:  [phone]   Type of Clearance Requested:   - Medical  - Pharmacy:  Hold Aspirin Not Indicated   Type of Anesthesia:  General    Additional requests/questions:    Signed, Emmit Pomfret   04/30/2023, 1:00 PM

## 2023-05-01 ENCOUNTER — Telehealth: Payer: Self-pay | Admitting: Genetic Counselor

## 2023-05-01 ENCOUNTER — Ambulatory Visit
Admission: RE | Admit: 2023-05-01 | Discharge: 2023-05-01 | Disposition: A | Discharge status: Home / Self Care | Source: Ambulatory Visit | Attending: Surgery | Admitting: Surgery

## 2023-05-01 DIAGNOSIS — C189 Malignant neoplasm of colon, unspecified: Secondary | ICD-10-CM | POA: Diagnosis not present

## 2023-05-01 MED ORDER — IOPAMIDOL (ISOVUE-370) INJECTION 76%
80.0000 mL | Freq: Once | INTRAVENOUS | Status: AC | PRN
  Administered 2023-05-01: 80 mL via INTRAVENOUS

## 2023-05-01 NOTE — Telephone Encounter (Signed)
 Will reach out to our scheduling team to reach ou to the pt with a New Pt appt for preop clearance.

## 2023-05-01 NOTE — Telephone Encounter (Signed)
 GI conference list review: Inbasket message sent to RN Buelah Manis notifying patient is candidate for genetic testing/ genetic counseling referral given her history of both hepatic flexure colon cancer and rectal cancer

## 2023-05-02 ENCOUNTER — Other Ambulatory Visit: Payer: Self-pay

## 2023-05-02 ENCOUNTER — Other Ambulatory Visit: Payer: Self-pay | Admitting: Nurse Practitioner

## 2023-05-02 DIAGNOSIS — C183 Malignant neoplasm of hepatic flexure: Secondary | ICD-10-CM

## 2023-05-02 DIAGNOSIS — C20 Malignant neoplasm of rectum: Secondary | ICD-10-CM

## 2023-05-02 NOTE — Progress Notes (Signed)
 Referral made to genetics counseling today.  -Vincent Gros, NP

## 2023-05-02 NOTE — Telephone Encounter (Signed)
 Pt has new pt appt 05/11/23 with Dr. Bing Matter. I will update all parties involved.

## 2023-05-03 ENCOUNTER — Encounter (HOSPITAL_BASED_OUTPATIENT_CLINIC_OR_DEPARTMENT_OTHER): Payer: Self-pay | Admitting: Pulmonary Disease

## 2023-05-03 ENCOUNTER — Telehealth (HOSPITAL_BASED_OUTPATIENT_CLINIC_OR_DEPARTMENT_OTHER): Payer: Self-pay

## 2023-05-03 ENCOUNTER — Ambulatory Visit (INDEPENDENT_AMBULATORY_CARE_PROVIDER_SITE_OTHER): Admitting: Pulmonary Disease

## 2023-05-03 VITALS — BP 122/74 | HR 102 | Ht 62.0 in | Wt 143.2 lb

## 2023-05-03 DIAGNOSIS — J449 Chronic obstructive pulmonary disease, unspecified: Secondary | ICD-10-CM | POA: Diagnosis not present

## 2023-05-03 DIAGNOSIS — J9611 Chronic respiratory failure with hypoxia: Secondary | ICD-10-CM

## 2023-05-03 DIAGNOSIS — Z01811 Encounter for preprocedural respiratory examination: Secondary | ICD-10-CM

## 2023-05-03 NOTE — Patient Instructions (Signed)
 YOUR PLAN:  -CHRONIC OBSTRUCTIVE PULMONARY DISEASE (COPD): COPD is a chronic lung disease that makes it hard to breathe. Your COPD is well-managed with your current medications (Breztri, Daliret, Singulair) and oxygen therapy. You are at moderate risk for complications during and after surgery due to your lung condition. Continue your current medications and oxygen therapy, use albuterol as needed, and you are cleared for surgery with moderate risk. A pulmonary consultation will be available post-surgery if needed.  -COLON CANCER: Colon cancer is a type of cancer that begins in the large intestine. You have been diagnosed with right-sided colon cancer and are scheduled for a right hemicolectomy. The surgery will be done laparoscopically if possible to minimize recovery time and pain, but a larger incision may be necessary. The surgeon will also assess your lymph nodes to determine if the cancer has spread. We discussed the risks, including potential ventilator support and pneumonia due to your lung condition.

## 2023-05-03 NOTE — Progress Notes (Signed)
 Subjective:    Patient ID: Erin Morrison, female    DOB: Jan 26, 1951, 73 y.o.   MRN: [id number]  HPI  73 yo ex-smoker for follow-up of moderate COPD and chronic respiratory failure on O2 since 2017 smoker 1.5-2ppd x 30+ years (quit 2012 ) -dyspnea & hypoxia out of proportion  to her lung function    PMH -noncritical CAD Ventral hernia surgery was felt to be high risk   She used to be on a regimen of Stiolto 2 puffs daily + Pulmicort nebulizer twice daily and Daliresp before switching to [employer].  4 month FU SUrgical clearance  The patient, with a history of COPD and chronic respiratory failure with hypoxia, is scheduled for a right hemicolectomy due to recently diagnosed colon cancer. The patient has been losing weight since February, prompting a CT scan which revealed the cancer. A subsequent colonoscopy resulted in the removal of five polyps. The patient tolerated the colonoscopy well. The patient's breathing has been stable, with the last exacerbation in January requiring prednisone. The patient denies current dyspnea. The patient is understandably anxious about the upcoming procedures, particularly the potential need for postoperative mechanical ventilation.  Significant tests/ events reviewed   03/2020 Oxygen saturation dropped to 87% on first lap and recovered to 92% on 2 L pulse   HRCT 11/2017 moderate emphysema, mild scarring at lung bases, postinflammatory, no ILD   PFTs 06/2021 moderate airway obstruction, ratio 60, FEV1 61%, FVC 76%, no bronchodilator response, TLC 128% consistent with hyperinflation, DLCO 11.2/60%   PFTs 03/2020 ratio 63, FEV1 65%, TLC 100%, DLCO 83% -Stable compared to 2018   PFT 03/2016: FVC 2.28 L (80%),FEV1 1.49 L (69%), ratio 66 sig bronchodilator response NO DLCO 07/2015: FVC 2.34 L (82%) FEV1 1.46 L (67%) ratio 63 sig bronchodilator response DLCO uncorrected 62%   Alpha-1 antitrypsin: MM (169)  Review of Systems neg for any significant sore  throat, dysphagia, itching, sneezing, nasal congestion or excess/ purulent secretions, fever, chills, sweats, unintended wt loss, pleuritic or exertional cp, hempoptysis, orthopnea pnd or change in chronic leg swelling. Also denies presyncope, palpitations, heartburn, abdominal pain, nausea, vomiting, diarrhea or change in bowel or urinary habits, dysuria,hematuria, rash, arthralgias, visual complaints, headache, numbness weakness or ataxia.     Objective:   Physical Exam  Gen. Pleasant, well-nourished, in no distress, anxious, on O2 ENT - no thrush, no pallor/icterus,no post nasal drip Neck: No JVD, no thyromegaly, no carotid bruits Lungs: no use of accessory muscles, no dullness to percussion, clear without rales or rhonchi  Cardiovascular: Rhythm regular, heart sounds  normal, no murmurs or gallops, no peripheral edema Musculoskeletal: No deformities, no cyanosis or clubbing        Assessment & Plan:  Chronic Obstructive Pulmonary Disease (COPD) Chronic resp failure with hypoxia - on POC COPD is well-managed with Markus Daft, Daliret, and Singulair. On maximum treatment and oxygen therapy. Chronic respiratory failure with hypoxia present. Moderate risk for complications during and after surgery due to compromised lung function. Discussed risk of pneumonia and prolonged mechanical ventilation post-surgery. Pulmonary consultation available post-surgery if needed. - Continue Breztri, Daliret, Singulair - Continue oxygen therapy - Use albuterol as needed   Colon cancer/ surgical clearance Recently diagnosed with right-sided colon cancer, slightly above stage one. Scheduled for right hemicolectomy. Tumor is significant in size. Plan for laparoscopic surgery to minimize recovery time and pain, with potential for larger incision if necessary. Surgeon will assess lymph nodes to determine cancer spread. Discussed risks including potential ventilator support  and pneumonia due to compromised lung  function. - Proceed with right hemicolectomy and hernia repair - Perform laparoscopic surgery if possible - Assess lymph nodes during surgery - Monitor for postoperative respiratory complications, early mob & incentive spirometry - Clear for surgery with moderate risk - Pulmonary consultation post-surgery if needed  Hernia Hernia repair planned concurrently with right hemicolectomy. Preference for laparoscopic repair if feasible. Discussed potential for increased pain and slower recovery if larger incision required. - Perform hernia repair during hemicolectomy - Attempt laparoscopic repair if possible  Follow-up Cardiac clearance appointment scheduled next Friday. Surgery date pending cardiac clearance. Advised to use nebulizer the day before surgery to ensure clear lungs. - Attend cardiac clearance appointment next Friday - Use nebulizer the day before surgery

## 2023-05-03 NOTE — Telephone Encounter (Signed)
 Ov note containing risk assessment from 05/03/23 with Dr Vassie Loll was faxed to [facility]    Surgeon:  Dr. Marin Olp Surgeon's Group or Practice Name:  [facility] Phone number:  [phone] Fax number:  [phone]

## 2023-05-03 NOTE — Telephone Encounter (Signed)
 Patient saw Dr Vassie Loll today for surgical clearance

## 2023-05-04 ENCOUNTER — Ambulatory Visit

## 2023-05-04 VITALS — BP 118/66 | HR 84 | Ht 62.0 in | Wt 146.0 lb

## 2023-05-04 DIAGNOSIS — Z01810 Encounter for preprocedural cardiovascular examination: Secondary | ICD-10-CM

## 2023-05-04 DIAGNOSIS — I82409 Acute embolism and thrombosis of unspecified deep veins of unspecified lower extremity: Secondary | ICD-10-CM | POA: Insufficient documentation

## 2023-05-04 DIAGNOSIS — N952 Postmenopausal atrophic vaginitis: Secondary | ICD-10-CM | POA: Insufficient documentation

## 2023-05-04 DIAGNOSIS — K429 Umbilical hernia without obstruction or gangrene: Secondary | ICD-10-CM | POA: Insufficient documentation

## 2023-05-04 DIAGNOSIS — I2510 Atherosclerotic heart disease of native coronary artery without angina pectoris: Secondary | ICD-10-CM | POA: Diagnosis not present

## 2023-05-04 NOTE — Assessment & Plan Note (Signed)
 Moderate nonobstructive disease cardiac cath February 2018. Stress test with nuclear imaging November 2018 without ischemia..  No history of acute coronary syndrome. Aspirin on hold given anemia and recent rectal cancer diagnosis. Okay to hold perioperatively. Resume aspirin postprocedure felt safe. Continue with atorvastatin 40 mg once daily.

## 2023-05-04 NOTE — Patient Instructions (Signed)
 Medication Instructions:  Your physician recommends that you continue on your current medications as directed. Please refer to the Current Medication list given to you today.  *If you need a refill on your cardiac medications before your next appointment, please call your pharmacy*  Lab Work: None If you have labs (blood work) drawn today and your tests are completely normal, you will receive your results only by: MyChart Message (if you have MyChart) OR A paper copy in the mail If you have any lab test that is abnormal or we need to change your treatment, we will call you to review the results.  Testing/Procedures: None  Follow-Up: At [facility], you and your health needs are our priority.  As part of our continuing mission to provide you with exceptional heart care, our providers are all part of one team.  This team includes your primary Cardiologist (physician) and Advanced Practice Providers or APPs (Physician Assistants and Nurse Practitioners) who all work together to provide you with the care you need, when you need it.  Your next appointment:   Follow up as needed  Provider:   Huntley Dec, MD    We recommend signing up for the patient portal called "MyChart".  Sign up information is provided on this After Visit Summary.  MyChart is used to connect with patients for Virtual Visits (Telemedicine).  Patients are able to view lab/test results, encounter notes, upcoming appointments, etc.  Non-urgent messages can be sent to your provider as well.   To learn more about what you can do with MyChart, go to [email or web address].   Other Instructions None

## 2023-05-04 NOTE — Assessment & Plan Note (Signed)
 She does have significant cardiovascular factors in the form of age, prior history of smoking, moderate nonobstructive coronary artery disease noted on prior cardiac cath from February 2018.  She however does not have any significant cardiac symptoms of chest pain, shortness of breath or heart failure and has good cardiac function on prior echocardiogram and no significant valve disease. No significant EKG changes today. Her overall functional status until about 6 months ago was at her baseline for many years.  She remains at elevated cardiovascular risk for complications perioperatively given her risk factors and history as above. Her overall functional status is somewhat limited due to her underlying severe COPD and now with anemia and ongoing abdominal issues. Further cardiac risk stratification at this time is not expected to change her overall outlook as the surgery is essential given her ongoing symptoms.  - They understand there is a slightly elevated risk for perioperative cardiovascular complications.   Proceed with her urgent noncardiac surgery as being planned. Her overall perioperative risk appears to be significant from pulmonary aspect as well and this has been discussed with her pulmonologist yesterday.

## 2023-05-04 NOTE — Progress Notes (Signed)
 Cardiology Consultation:    Date:  05/04/2023   ID:  Erin Morrison, DOB 1950/12/08, MRN [id number]  PCP:  Erin Peak, PA-C  Cardiologist:  Erin Corporal Randie Bloodgood, MD   Referring MD: Erin Peak, PA-C   No chief complaint on file.    ASSESSMENT AND PLAN:   Erin Morrison a 73 year old woman seen today for preop cardiovascular risk assessment prior to laparoscopic right hemicolectomy and repair of ventral hernia being planned by Erin Morrison tentatively to be done in general anesthesia.  Has history of moderate nonobstructive coronary artery disease involving mostly LAD distribution on cardiac cath in February 2018, normal right and left heart filling pressures at the time, last echocardiogram available to review is from December 2022 with normal biventricular function, grade 1 diastolic dysfunction and no significant valve abnormalities, severe COPD on 2 L home O2 by nasal cannula, has history of hyperlipidemia, umbilical hernia, unintentional weight loss over the last few months associated with abdominal pain now diagnosed with cancerous polyps and is anticipating surgery.   Problem List Items Addressed This Visit     CAD in native artery   Moderate nonobstructive disease cardiac cath February 2018. Stress test with nuclear imaging November 2018 without ischemia..  No history of acute coronary syndrome. Aspirin on hold given anemia and recent rectal cancer diagnosis. Okay to hold perioperatively. Resume aspirin postprocedure felt safe. Continue with atorvastatin 40 mg once daily.      Preop cardiovascular exam - Primary   She does have significant cardiovascular factors in the form of age, prior history of smoking, moderate nonobstructive coronary artery disease noted on prior cardiac cath from February 2018.  She however does not have any significant cardiac symptoms of chest pain, shortness of breath or heart failure and has good cardiac function on prior echocardiogram and  no significant valve disease. No significant EKG changes today. Her overall functional status until about 6 months ago was at her baseline for many years.  She remains at elevated cardiovascular risk for complications perioperatively given her risk factors and history as above. Her overall functional status is somewhat limited due to her underlying severe COPD and now with anemia and ongoing abdominal issues. Further cardiac risk stratification at this time is not expected to change her overall outlook as the surgery is essential given her ongoing symptoms.  - They understand there is a slightly elevated risk for perioperative cardiovascular complications.   Proceed with her urgent noncardiac surgery as being planned. Her overall perioperative risk appears to be significant from pulmonary aspect as well and this has been discussed with her pulmonologist yesterday.       Relevant Orders   EKG 12-Lead (Completed)      History of Present Illness:    Erin Morrison is a 73 y.o. female who is being seen today for the evaluation of preop cardiovascular risk assessment prior to elective laparoscopic right hemicolectomy and repair of ventral hernia in the setting of cancerous rectal polyp identified on recent colonoscopy March 2025 by Erin Morrison.  At the request of Erin Peak, PA-C. Previous follow-up with cardiologist was March 2019 at heart care with Erin Morrison. F/u with Dr. Vassie Morrison at [facility] pulmonology  Has history of chronic diastolic heart failure, coronary angiogram done in the setting of regional wall motion abnormalities and NSTEMI while admitted for respiratory failure from COPD exacerbation, noted showed moderate nonobstructive coronary artery disease and normal right and left heart pressures, severe COPD on home O2 2 L/min  nasal cannula, former smoker [quit in 2012] hyperlipidemia, has an umbilical hernia, unintentional weight loss underwent colonoscopy March 25 identified  cancerous polyp in the rectum, significant anemia hemoglobin 8.6 and hematocrit 29.6 on 04/25/2023.   Here for the visit accompanied by her husband. Denies any symptoms of chest pain.  Functional status is however significantly limited due to deconditioning and COPD.  Mentions she does walk around the house without any significant limitations and does her day-to-day activities.  Outdoors her walking is limited due to O2 requirement.  However over the last 6 months she has been losing significant amount of weight and does have abdominal discomfort which is partly relieved with bowel movement but continues to recur.  Denies any orthopnea, paroxysmal nocturnal dyspnea, pedal edema. Denies any palpitations, lightheadedness, dizziness, syncopal episodes.  EKG in the clinic today shows sinus rhythm heart rate 84/min, PR interval 132 ms, QRS duration 72 ms, QTc normal.  Last echocardiogram available to review is from December 2022 with LVEF 60 to 65%, grade 1 diastolic dysfunction, normal RV size and function, no significant valve abnormalities reported.  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  Coronary angiogram right and left heart cath from February 2018 noted moderate stenosis of proximal to mid LAD 50 to 75%, septal perforator with an 80% lesion, LCx and RCA with luminal irregularities.  Normal filling pressures and normal right heart pressures.  She subsequently had a stress test November 2018 that showed no ischemia.  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   Seen on last chest x-ray 12/2015, not on x-ray from 12/2015     Essential hypertension 06/08/2015   Former smoker 02/24/2022   GERD (gastroesophageal reflux disease) 10/24/2016   H/O steroid therapy 06/08/2015   Hyperlipidemia 12/06/2015   Hypertension    Low back pain    Lower back pain 04/08/2018   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) 04/25/2023   Moderate COPD (chronic obstructive pulmonary disease) (HCC) 06/08/2015      03/23/16: FVC 2.28 L (80%),FEV1 1.49 L (69%), ratio 66 sig bronchodilator response NO DLCO  07/13/15: FVC 2.34 L (82%) FEV1 1.46 L (67%) ratio 63 sig bronchodilator response DLCO uncorrected 62%           NSTEMI (non-ST elevated myocardial infarction) (HCC) 12/06/2015   Setup cath     Pre-operative respiratory examination 08/31/2020   Umbilical hernia    UTI (urinary tract infection) 04/10/2018    Past Surgical History:  Procedure Laterality Date   arthroscopic knee surgery Right    CARDIAC CATHETERIZATION N/A 03/03/2016   Procedure: Right/Left Heart Cath and Coronary Angiography;  Surgeon: Lyn Records, MD;  Location: [facility] INVASIVE CV LAB;  Service: Cardiovascular;  Laterality: N/A;   COLONOSCOPY N/A 04/20/2023   Procedure: COLONOSCOPY;  Surgeon: Kerin Salen, MD;  Location: [facility] ENDOSCOPY;  Service: Gastroenterology;  Laterality: N/A;   ESOPHAGOGASTRODUODENOSCOPY  N/A 04/20/2023   Procedure: EGD (ESOPHAGOGASTRODUODENOSCOPY);  Surgeon: Kerin Salen, MD;  Location: [facility] ENDOSCOPY;  Service: Gastroenterology;  Laterality: N/A;   HEMOSTASIS CLIP PLACEMENT  04/20/2023   Procedure: CONTROL OF HEMORRHAGE, GI TRACT, ENDOSCOPIC, BY CLIPPING OR OVERSEWING;  Surgeon: Kerin Salen, MD;  Location: [facility] ENDOSCOPY;  Service: Gastroenterology;;   POLYPECTOMY  04/20/2023   Procedure: POLYPECTOMY;  Surgeon: Kerin Salen, MD;   Location: [facility] ENDOSCOPY;  Service: Gastroenterology;;   TUBAL LIGATION      Current Medications: Current Meds  Medication Sig   acetaminophen (TYLENOL) 500 MG tablet Take 500 mg by mouth 2 (two) times daily as needed for moderate pain or headache.   albuterol (PROVENTIL) (2.5 MG/3ML) 0.083% nebulizer solution Take 2.5 mg by nebulization every 6 (six) hours as needed for wheezing or shortness of breath.   albuterol (VENTOLIN HFA) 108 (90 Base) MCG/ACT inhaler Inhale 2 puffs into the lungs every 6 (six) hours as needed for wheezing or shortness of breath.   atorvastatin (LIPITOR) 40 MG tablet TAKE 1 TABLET BY MOUTH EVERY DAY AT 6PM   Budeson-Glycopyrrol-Formoterol (BREZTRI AEROSPHERE) 160-9-4.8 MCG/ACT AERO Inhale 2 puffs into the lungs in the morning and at bedtime.   escitalopram (LEXAPRO) 10 MG tablet Take 10 mg by mouth daily.   montelukast (SINGULAIR) 10 MG tablet TAKE 1 TABLET BY MOUTH EVERYDAY AT BEDTIME   roflumilast (DALIRESP) 500 MCG TABS tablet Take 1 tablet (500 mcg total) by mouth daily.     Allergies:   Patient has no known allergies.   Social History   Socioeconomic History   Marital status: Married    Spouse name: Not on file   Number of children: Not on file   Years of education: Not on file   Highest education level: Not on file  Occupational History   Occupation: Retired  Tobacco Use   Smoking status: Former    Current packs/day: 0.00    Average packs/day: 1.5 packs/day for 46.0 years (69.0 ttl pk-yrs)    Types: Cigarettes    Start date: 01/31/1964    Quit date: 01/30/2010    Years since quitting: 13.2   Smokeless tobacco: Never  Substance and Sexual Activity   Alcohol use: No    Alcohol/week: 0.0 standard drinks of alcohol   Drug use: No   Sexual activity: Never  Other Topics Concern   Not on file  Social History Narrative   [facility]:   Originally from [place]. Moved to [place] in 1986. Has traveled to [facility]. 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 Has an outside dog. No bird or mold exposure. She does have a musty smell in her bedroom.    Social Drivers of Corporate investment banker Strain: Not on file  Food Insecurity: Not on file  Transportation Needs: Not on file  Physical Activity: Not on file  Stress: Not on file  Social Connections: Not on file     Family History: The patient's family history includes Cirrhosis in her father; Emphysema in her mother. ROS:   Please see the history of present illness.    All 14 point review of systems negative except as described per history of present illness.  EKGs/Labs/Other Studies Reviewed:    The following studies were reviewed today:   EKG:  EKG Interpretation Date/Time:  Friday May 04 2023 14:36:20 EDT Ventricular Rate:  84 PR Interval:  132 QRS Duration:  72 QT Interval:  382 QTC Calculation:  451 R Axis:   50  Text Interpretation: Sinus rhythm with marked sinus arrhythmia When compared with ECG of 03-Mar-2016 05:53, No significant change was found Confirmed by Huntley Dec reddy [phone]) on 05/04/2023 3:03:15 PM    Recent Labs: 04/25/2023: ALT 8; BUN 9; Creatinine 0.48; Hemoglobin 8.6; Platelet Count 555; Potassium 3.9; Sodium 140  Recent Lipid Panel    Component Value Date/Time   CHOL 124 11/27/2016 1207   TRIG 83 11/27/2016 1207   HDL 55 11/27/2016 1207   CHOLHDL 2.3 11/27/2016 1207   CHOLHDL 3.6 12/04/2015 0511   VLDL 16 12/04/2015 0511   LDLCALC 52 11/27/2016 1207    Physical Exam:    VS:  BP 118/66   Pulse 84   Ht 5\' 2"  (1.575 m)   Wt 146 lb (66.2 kg)   SpO2 94%   BMI 26.70 kg/m     Wt Readings from Last 3 Encounters:  05/04/23 146 lb (66.2 kg)  05/03/23 143 lb 3.2 oz (65 kg)  04/25/23 147 lb 1.6 oz (66.7 kg)     GENERAL: Thin and frail appearing lady not in any acute distress.  On 2 L nasal flow oxygen NECK: No JVD; No carotid bruits CARDIAC: RRR, S1 and S2 present, no murmurs, no rubs, no  gallops CHEST:  Clear to auscultation without rales, wheezing or rhonchi  Extremities: No pitting pedal edema. Pulses bilaterally symmetric with radial 2+ and dorsalis pedis 2+ NEUROLOGIC:  Alert and oriented x 3  Medication Adjustments/Labs and Tests Ordered: Current medicines are reviewed at length with the patient today.  Concerns regarding medicines are outlined above.  Orders Placed This Encounter  Procedures   EKG 12-Lead   No orders of the defined types were placed in this encounter.   Signed, Cecille Amsterdam, MD, MPH, [facility]. 05/04/2023 3:28 PM    [facility]

## 2023-05-07 NOTE — Progress Notes (Signed)
 The proposed treatment discussed in conference is for discussion purpose only and is not a binding recommendation.  The patients have not been physically examined, or presented with their treatment options.  Therefore, final treatment plans cannot be decided.

## 2023-05-10 ENCOUNTER — Telehealth (HOSPITAL_BASED_OUTPATIENT_CLINIC_OR_DEPARTMENT_OTHER): Admitting: Hematology

## 2023-05-10 DIAGNOSIS — C20 Malignant neoplasm of rectum: Secondary | ICD-10-CM | POA: Diagnosis not present

## 2023-05-10 DIAGNOSIS — C183 Malignant neoplasm of hepatic flexure: Secondary | ICD-10-CM | POA: Diagnosis not present

## 2023-05-10 NOTE — Progress Notes (Signed)
 [facility]   [phone] [phone]   Clinic Follow up Note   Patient Care Team: Lonie Peak, PA-C as PCP - General (Physician Assistant) Nonah Mattes, RN as Oncology Nurse Navigator Malachy Mood, MD as Consulting Physician (Hematology and Oncology) Oretha Milch, MD as Consulting Physician (Pulmonary Disease) 05/10/2023  I connected with Erin Morrison on 05/10/23 at 11:20 AM EDT by telephone and verified that I am speaking with the correct person using two identifiers.   I discussed the limitations, risks, security and privacy concerns of performing an evaluation and management service by telephone and the availability of in person appointments. I also discussed with the patient that there may be a patient responsible charge related to this service. The patient expressed understanding and agreed to proceed.   Patient's location:  Home  Provider's location:  Office    CHIEF COMPLAINT: Follow-up of colon cancer   CURRENT THERAPY: Pending  Assessment & Plan Colon cancer with suspected lymph node metastasis Colon cancer in the descending colon with suspicious lymph nodes, indicating possible metastasis. CT scan on May 01, 2023, confirmed these findings. Cleared for surgery by cardiology and pulmonology.  Due to her MSI high disease, preoperative immunotherapy can be offered to shrink the tumor, as it is generally better tolerated than chemotherapy with fewer side effects such as nausea, fatigue, and alopecia. However, she prefers surgery first due to concerns about potential weakness and repeated preoperative evaluations. - Schedule surgery as preferred.  Rectal cancer Previously excised rectal tumor with unclear margins. Pelvic MRI recommended to assess for residual cancer. - Order pelvic MRI for rectal cancer staging before surgery.  Diverticulitis CT scan indicated diverticulitis, unrelated to cancer diagnosis, involving inflammation  of colonic diverticula.  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  Plan -I discussed option of neoadjuvant immunotherapy versus surgery first, patient and her husband prefer surgery first.  I sent a message to Dr. Cliffton Asters. -Plan to see her back in 3 weeks after her surgery. -I ordered a pelvic MRI for rectal cancer staging, plan to get it done in the next week.     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 Marked right-sided colonic wall thickening with relatively mild surrounding edema. Findings suspicious for colon carcinoma. Relatively focal infectious or ischemic colitis felt less likely. 2. Borderline ileocolic mesenteric adenopathy, suspicious for regional nodal metastasis. No evidence of distant metastasis. 3. Infrarenal abdominal aortic aneurysm with mild extension into the right common iliac. Recommend follow-up ultrasound every 3 years.  (Ref.: J Vasc Surg. 2018; 67:2-77 and J Am Coll Radiol 2013;10(10):789-794.) 4. 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 Differential considerations include liposarcoma, teratoma, or extramedullary hematopoiesis. This can be re-evaluated on follow-up CT at 6 months. 5. Incidental findings, including: Aortic atherosclerosis (ICD10-I70.0), coronary artery atherosclerosis and emphysema (ICD10-J43.9). Fat containing ventral abdominopelvic and right inguinal hernias   04/20/2023 Procedure   Endoscopy  Normal esophagus with a widely patent Schatzki ring.  There is a 1 cm hiatal hernia.  She has erosive gastropathy and a normal duodenum.   --Biopsies taken from the duodenum show chronic peptic duodenitis, negative for dysplasia.  Biopsies from the stomach show chronic, inactive gastritis.  Biopsies were  negative for H. pylori and negative for  dysplasia.     04/20/2023 Procedure  Colonoscopy  Malignant, partially obstructing tumor at the hepatic flexure.  There were three 6 to 9 mm polyps in the transverse colon.  A 5 cm polyp in the rectum (15 cm from the anus) was removed piecemeal.  It was resected and retrieved.  A 2 cm polyp in the rectum (10 cm from the anus) was removed piecemeal.  It was resected and retrieved.  There were nonbleeding, internal hemorrhoids and diverticulosis in the sigmoid colon.   --Biopsy of the tumor at hepatic flexure was positive for invasive adenocarcinoma, moderately differentiated.  MMR is abnormal.   --Biopsies of the polyps from transverse colon showed tubular adenoma, negative for high-grade dysplasia.   --Biopsy of 5 cm polyp in the rectum, 15 cm from the anus, showed invasive adenocarcinoma, moderately differentiated.  It was arising from tubular adenoma with high-grade dysplasia.  MMR protein is preserved.   --Biopsy of 2 cm polyp, 10 cm from the anus, showed tubular adenoma, negative for high-grade dysplasia or invasive carcinoma.   04/20/2023 Pathology Results   --Biopsy of the tumor at hepatic flexure was positive for invasive adenocarcinoma, moderately differentiated.  MMR is abnormal.   --Biopsies of the polyps from transverse colon showed tubular adenoma, negative for high-grade dysplasia.   --Biopsy of 5 cm polyp in the rectum, 15 cm from the anus, showed invasive adenocarcinoma, moderately differentiated.  It was arising from tubular adenoma with high-grade dysplasia.  MMR protein is preserved.   --Biopsy of 2 cm polyp, 10 cm from the anus, showed tubular adenoma, negative for high-grade dysplasia or invasive carcinoma.   04/25/2023 Initial Diagnosis   Malignant neoplasm of hepatic flexure of colon [facility])     Discussed the use of AI scribe software for clinical note transcription with the patient, who gave verbal consent to proceed.  History of Present Illness A 73 year old patient with a  history of colon and rectal cancer presents for a follow-up virtual visit. The patient has been cleared for surgery by her cardiologist and pulmonologist. A recent CT scan showed cancer in the descending colon and suspicious lymph nodes around the colon, suggestive of lymph node metastasis. The scan also revealed diverticulitis and a lipoma. The patient and her husband have expressed a preference to proceed with surgery before considering other treatment options such as immunotherapy.     REVIEW OF SYSTEMS:   Constitutional: Denies fevers, chills or abnormal weight loss Eyes: Denies blurriness of vision Ears, nose, mouth, throat, and face: Denies mucositis or sore throat Respiratory: Denies cough, dyspnea or wheezes Cardiovascular: Denies palpitation, chest discomfort or lower extremity swelling Gastrointestinal:  Denies nausea, heartburn or change in bowel habits Skin: Denies abnormal skin rashes Lymphatics: Denies new lymphadenopathy or easy bruising Neurological:Denies numbness, tingling or new weaknesses Behavioral/Psych: Mood is stable, no new changes  All other systems were reviewed with the patient and are negative.  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   Seen on last chest x-ray 12/2015, not on x-ray from 12/2015     Essential hypertension 06/08/2015   Former smoker 02/24/2022  GERD (gastroesophageal reflux disease) 10/24/2016   H/O steroid therapy 06/08/2015   Hyperlipidemia 12/06/2015   Hypertension    Low back pain    Lower back pain 04/08/2018    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) 04/25/2023   Moderate COPD (chronic obstructive pulmonary disease) (HCC) 06/08/2015      03/23/16: FVC 2.28 L (80%),FEV1 1.49 L (69%), ratio 66 sig bronchodilator response NO DLCO  07/13/15: FVC 2.34 L (82%) FEV1 1.46 L (67%) ratio 63 sig bronchodilator response DLCO uncorrected 62%           NSTEMI (non-ST elevated myocardial infarction) (HCC) 12/06/2015   Setup cath     Pre-operative respiratory examination 08/31/2020   Umbilical hernia    UTI (urinary tract infection) 04/10/2018    SURGICAL HISTORY: Past Surgical History:  Procedure Laterality Date   arthroscopic knee surgery Right    CARDIAC CATHETERIZATION N/A 03/03/2016   Procedure: Right/Left Heart Cath and Coronary Angiography;  Surgeon: Lyn Records, MD;  Location: [facility] INVASIVE CV LAB;  Service: Cardiovascular;  Laterality: N/A;   COLONOSCOPY N/A 04/20/2023   Procedure: COLONOSCOPY;  Surgeon: Kerin Salen, MD;  Location: [facility] ENDOSCOPY;  Service: Gastroenterology;  Laterality: N/A;   ESOPHAGOGASTRODUODENOSCOPY N/A 04/20/2023   Procedure: EGD (ESOPHAGOGASTRODUODENOSCOPY);  Surgeon: Kerin Salen, MD;  Location: [facility] ENDOSCOPY;  Service: Gastroenterology;  Laterality: N/A;   HEMOSTASIS CLIP PLACEMENT  04/20/2023   Procedure: CONTROL OF HEMORRHAGE, GI TRACT, ENDOSCOPIC, BY CLIPPING OR OVERSEWING;  Surgeon: Kerin Salen, MD;  Location: [facility] ENDOSCOPY;  Service: Gastroenterology;;   POLYPECTOMY  04/20/2023   Procedure: POLYPECTOMY;  Surgeon: Kerin Salen, MD;  Location: [facility] ENDOSCOPY;  Service: Gastroenterology;;   TUBAL LIGATION      I have reviewed the social history and family history with the patient and they are unchanged from previous note.  ALLERGIES:  has no known allergies.  MEDICATIONS:  Current Outpatient Medications  Medication Sig Dispense Refill   acetaminophen (TYLENOL) 500 MG tablet Take 500 mg by mouth 2 (two) times daily as needed for moderate pain or headache.     albuterol  (PROVENTIL) (2.5 MG/3ML) 0.083% nebulizer solution Take 2.5 mg by nebulization every 6 (six) hours as needed for wheezing or shortness of breath.     albuterol (VENTOLIN HFA) 108 (90 Base) MCG/ACT inhaler Inhale 2 puffs into the lungs every 6 (six) hours as needed for wheezing or shortness of breath. 18 g 3   atorvastatin (LIPITOR) 40 MG tablet TAKE 1 TABLET BY MOUTH EVERY DAY AT 6PM 15 tablet 0   Budeson-Glycopyrrol-Formoterol (BREZTRI AEROSPHERE) 160-9-4.8 MCG/ACT AERO Inhale 2 puffs into the lungs in the morning and at bedtime. 10.7 each 11   escitalopram (LEXAPRO) 10 MG tablet Take 10 mg by mouth daily.     montelukast (SINGULAIR) 10 MG tablet TAKE 1 TABLET BY MOUTH EVERYDAY AT BEDTIME 90 tablet 3   roflumilast (DALIRESP) 500 MCG TABS tablet Take 1 tablet (500 mcg total) by mouth daily. 90 tablet 3   No current facility-administered medications for this visit.    PHYSICAL EXAMINATION: Not performed   LABORATORY DATA:  I have reviewed the data as listed    Latest Ref Rng & Units 04/25/2023    3:39 PM 04/18/2017   10:48 AM 02/24/2016   12:30 PM  CBC  WBC 4.0 - 10.5 K/uL 10.3  11.6  12.3   Hemoglobin 12.0 - 15.0 g/dL 8.6  16.1  09.6   Hematocrit 36.0 - 46.0 % 29.6  42.0  42.1   Platelets 150 - 400 K/uL 555  319  384         Latest Ref Rng & Units 04/25/2023    3:39 PM 04/18/2017   10:48 AM 11/27/2016   12:07 PM  CMP  Glucose 70 - 99 mg/dL 95  027    BUN 8 - 23 mg/dL 9  13    Creatinine 2.53 - 1.00 mg/dL 6.64  4.03    Sodium 474 - 145 mmol/L 140  141    Potassium 3.5 - 5.1 mmol/L 3.9  4.5    Chloride 98 - 111 mmol/L 103  104    CO2 22 - 32 mmol/L 29  21    Calcium 8.9 - 10.3 mg/dL 9.4  9.6    Total Protein 6.5 - 8.1 g/dL 7.0  6.7  6.6   Total Bilirubin 0.0 - 1.2 mg/dL 0.5  0.9  1.1   Alkaline Phos 38 - 126 U/L 77  87  89   AST 15 - 41 U/L 11  18  17    ALT 0 - 44 U/L 8  21  22        RADIOGRAPHIC STUDIES: I have personally reviewed the radiological images as listed and  agreed with the findings in the report. No results found.     I discussed the assessment and treatment plan with the patient. The patient was provided an opportunity to ask questions and all were answered. The patient agreed with the plan and demonstrated an understanding of the instructions.   The patient was advised to call back or seek an in-person evaluation if the symptoms worsen or if the condition fails to improve as anticipated.  I provided 25 minutes of non face-to-face telephone visit time during this encounter, and > 50% was spent counseling as documented under my assessment & plan.     Malachy Mood, MD 05/10/23

## 2023-05-11 ENCOUNTER — Ambulatory Visit: Admitting: Cardiology

## 2023-05-14 ENCOUNTER — Encounter (HOSPITAL_COMMUNITY): Payer: Self-pay

## 2023-05-14 ENCOUNTER — Ambulatory Visit (HOSPITAL_COMMUNITY)
Admission: RE | Admit: 2023-05-14 | Discharge: 2023-05-14 | Disposition: A | Discharge status: Home / Self Care | Source: Ambulatory Visit | Attending: Hematology | Admitting: Hematology

## 2023-05-14 ENCOUNTER — Other Ambulatory Visit (HOSPITAL_COMMUNITY): Payer: Self-pay | Admitting: Hematology

## 2023-05-14 DIAGNOSIS — K5790 Diverticulosis of intestine, part unspecified, without perforation or abscess without bleeding: Secondary | ICD-10-CM | POA: Diagnosis not present

## 2023-05-14 DIAGNOSIS — T81509A Unspecified complication of foreign body accidentally left in body following unspecified procedure, initial encounter: Secondary | ICD-10-CM | POA: Insufficient documentation

## 2023-05-14 DIAGNOSIS — C20 Malignant neoplasm of rectum: Secondary | ICD-10-CM | POA: Diagnosis not present

## 2023-05-14 DIAGNOSIS — Z1389 Encounter for screening for other disorder: Secondary | ICD-10-CM | POA: Diagnosis not present

## 2023-05-15 LAB — MOLECULAR PATHOLOGY

## 2023-05-16 NOTE — Progress Notes (Signed)
 Received phone call from patient's husband stating the patient's staging MR was cancelled on Monday and they wanted to make sure Dr. Maryalice Smaller was aware. Patient's husband also stated they wanted find out what the patient's next steps were.  Informed patient and her husband that Dr. Maryalice Smaller was made aware on Monday and that she let patient's surgeon, Dr. Camilo Cella know.  Patient is scheduled for surgery next month and stated she has a pre-op appointment coming up.  Informed patient to keep her upcoming appointments with Dr. Maurie Southern office and that they would inform her if there will be any changes to her planned care.  Patient and husband verbalized understanding and both agreed to contact office if they had any future questions or concerns.

## 2023-05-19 DIAGNOSIS — J449 Chronic obstructive pulmonary disease, unspecified: Secondary | ICD-10-CM | POA: Diagnosis not present

## 2023-05-19 DIAGNOSIS — J9601 Acute respiratory failure with hypoxia: Secondary | ICD-10-CM | POA: Diagnosis not present

## 2023-05-19 DIAGNOSIS — J410 Simple chronic bronchitis: Secondary | ICD-10-CM | POA: Diagnosis not present

## 2023-05-29 DIAGNOSIS — C189 Malignant neoplasm of colon, unspecified: Secondary | ICD-10-CM | POA: Diagnosis not present

## 2023-06-01 ENCOUNTER — Ambulatory Visit: Payer: Self-pay | Admitting: Surgery

## 2023-06-01 DIAGNOSIS — Z01818 Encounter for other preprocedural examination: Secondary | ICD-10-CM

## 2023-06-01 NOTE — Progress Notes (Signed)
 Surgery orders requested via Epic inbox.

## 2023-06-07 ENCOUNTER — Encounter (HOSPITAL_COMMUNITY): Payer: Self-pay

## 2023-06-07 NOTE — Patient Instructions (Addendum)
 SURGICAL WAITING ROOM VISITATION Patients having surgery or a procedure may have no more than 2 support people in the waiting area - these visitors may rotate.    Children under the age of 33 must have an adult with them who is not the patient.  If the patient needs to stay at the [facility] during part of their recovery, the visitor guidelines for inpatient rooms apply. Pre-op nurse will coordinate an appropriate time for 1 support person to accompany patient in pre-op.  This support person may not rotate.    Please refer to the [facility] website for the visitor guidelines for Inpatients (after your surgery is over and you are in a regular room).       Your procedure is scheduled on: 06-13-23   Report to [facility] Main Entr[facility]    Report to admitting at 6:15 AM   Call this number if you have problems the morning of surgery [phone]   Follow a clear liquid diet the day of prep to prevent dehydration             FOLLOW BOWEL PREP PER MD OFFICE    After Midnight you may have the following liquids until 5:30 AM DAY OF SURGERY  Water Non-Citrus Juices (without pulp, NO RED-Apple, White grape, White cranberry) Black Coffee (NO MILK/CREAM OR CREAMERS, sugar ok)  Clear Tea (NO MILK/CREAM OR CREAMERS, sugar ok) regular and decaf                             Plain Jell-O (NO RED)                                           Fruit ices (not with fruit pulp, NO RED)                                     Popsicles (NO RED)                                                               Sports drinks like Gatorade (NO RED)   Drink 2 Pre-surgery clear Ensure the evening before surgery                    The day of surgery:  Drink ONE (1) Pre-Surgery Clear Ensure by 5:30 AM the morning of surgery. Drink in one sitting. Do not sip.  This drink was given to you during your [facility]  pre-op appointment visit. Nothing else to drink after completing the Pre-Surgery Clear Ensure           If you have questions, please contact your surgeon's office.   FOLLOW BOWEL PREP AND ANY ADDITIONAL PRE OP INSTRUCTIONS YOU RECEIVED FROM YOUR SURGEON'S OFFICE!!!     Oral Hygiene is also important to reduce your risk of infection.                                    Remember -  BRUSH YOUR TEETH THE MORNING OF SURGERY WITH YOUR REGULAR TOOTHPASTE   Do NOT smoke after Midnight   Take these medicines the morning of surgery with A SIP OF WATER:    Escitalopram   Okay to use inhalers/nebulizers   Tylenol  if needed  Stop all vitamins and herbal supplements 7 days before surgery                              You may not have any metal on your body including hair pins, jewelry, and body piercing             Do not wear make-up, lotions, powders, perfumes or deodorant  Do not wear nail polish including gel and S&S, artificial/acrylic nails, or any other type of covering on natural nails including finger and toenails. If you have artificial nails, gel coating, etc. that needs to be removed by a nail salon please have this removed prior to surgery or surgery may need to be canceled/ delayed if the surgeon/ anesthesia feels like they are unable to be safely monitored.   Do not shave  48 hours prior to surgery.        Do not bring valuables to the [facility]. [facility] IS NOT RESPONSIBLE   FOR VALUABLES.   Contacts, dentures or bridgework may not be worn into surgery.   Bring small overnight bag day of surgery.   DO NOT BRING YOUR HOME MEDICATIONS TO THE [facility]. PHARMACY WILL DISPENSE MEDICATIONS LISTED ON YOUR MEDICATION LIST TO YOU DURING YOUR ADMISSION IN THE [facility]!     Special Instructions: Bring a copy of your healthcare power of attorney and living will documents the day of surgery if you haven't scanned them before.              Please read over the following fact sheets you were given: IF YOU HAVE QUESTIONS ABOUT YOUR PRE-OP INSTRUCTIONS PLEASE CALL [phone] [facility]  If you  test positive for Covid or have been in contact with anyone that has tested positive in the last 10 days please notify you surgeon.  [facility] - Preparing for Surgery Before surgery, you can play an important role.  Because skin is not sterile, your skin needs to be as free of germs as possible.  You can reduce the number of germs on your skin by washing with CHG (chlorahexidine gluconate) soap before surgery.  CHG is an antiseptic cleaner which kills germs and bonds with the skin to continue killing germs even after washing. Please DO NOT use if you have an allergy  to CHG or antibacterial soaps.  If your skin becomes reddened/irritated stop using the CHG and inform your nurse when you arrive at Short Stay. Do not shave (including legs and underarms) for at least 48 hours prior to the first CHG shower.  You may shave your face/neck.  Please follow these instructions carefully:  1.  Shower with CHG Soap the night before surgery and the  morning of surgery.  2.  If you choose to wash your hair, wash your hair first as usual with your normal  shampoo.  3.  After you shampoo, rinse your hair and body thoroughly to remove the shampoo.                             4.  Use CHG as you would any other liquid soap.  You can apply chg directly to the skin and wash.  Gently with a scrungie or clean washcloth.  5.  Apply the CHG Soap to your body ONLY FROM THE NECK DOWN.   Do not use on face/ open                           Wound or open sores. Avoid contact with eyes, ears mouth and genitals (private parts).                       Wash face,  Genitals (private parts) with your normal soap.             6.  Wash thoroughly, paying special attention to the area where your  surgery  will be performed.  7.  Thoroughly rinse your body with warm water from the neck down.  8.  DO NOT shower/wash with your normal soap after using and rinsing off the CHG Soap.                9.  Pat yourself dry with a clean towel.             10.  Wear clean pajamas.            11.  Place clean sheets on your bed the night of your first shower and do not  sleep with pets. Day of Surgery : Do not apply any lotions/deodorants the morning of surgery.  Please wear clean clothes to the [facility]/surgery center.  FAILURE TO FOLLOW THESE INSTRUCTIONS MAY RESULT IN THE CANCELLATION OF YOUR SURGERY  PATIENT SIGNATURE_________________________________  NURSE SIGNATURE__________________________________  ________________________________________________________________________    [facility]  An incentive spirometer is a tool that can help keep your lungs clear and active. This tool measures how well you are filling your lungs with each breath. Taking long deep breaths may help reverse or decrease the chance of developing breathing (pulmonary) problems (especially infection) following: A long period of time when you are unable to move or be active. BEFORE THE PROCEDURE  If the spirometer includes an indicator to show your best effort, your nurse or respiratory therapist will set it to a desired goal. If possible, sit up straight or lean slightly forward. Try not to slouch. Hold the incentive spirometer in an upright position. INSTRUCTIONS FOR USE  Sit on the edge of your bed if possible, or sit up as far as you can in bed or on a chair. Hold the incentive spirometer in an upright position. Breathe out normally. Place the mouthpiece in your mouth and seal your lips tightly around it. Breathe in slowly and as deeply as possible, raising the piston or the ball toward the top of the column. Hold your breath for 3-5 seconds or for as long as possible. Allow the piston or ball to fall to the bottom of the column. Remove the mouthpiece from your mouth and breathe out normally. Rest for a few seconds and repeat Steps 1 through 7 at least 10 times every 1-2 hours when you are awake. Take your time and take a few normal breaths  between deep breaths. The spirometer may include an indicator to show your best effort. Use the indicator as a goal to work toward during each repetition. After each set of 10 deep breaths, practice coughing to be sure your lungs are clear. If you have an incision (the cut made at the time of surgery),  support your incision when coughing by placing a pillow or rolled up towels firmly against it. Once you are able to get out of bed, walk around indoors and cough well. You may stop using the incentive spirometer when instructed by your caregiver.  RISKS AND COMPLICATIONS Take your time so you do not get dizzy or light-headed. If you are in pain, you may need to take or ask for pain medication before doing incentive spirometry. It is harder to take a deep breath if you are having pain. AFTER USE Rest and breathe slowly and easily. It can be helpful to keep track of a log of your progress. Your caregiver can provide you with a simple table to help with this. If you are using the spirometer at home, follow these instructions: SEEK MEDICAL CARE IF:  You are having difficultly using the spirometer. You have trouble using the spirometer as often as instructed. Your pain medication is not giving enough relief while using the spirometer. You develop fever of 100.5 F (38.1 C) or higher. SEEK IMMEDIATE MEDICAL CARE IF:  You cough up bloody sputum that had not been present before. You develop fever of 102 F (38.9 C) or greater. You develop worsening pain at or near the incision site. MAKE SURE YOU:  Understand these instructions. Will watch your condition. Will get help right away if you are not doing well or get worse. Document Released: 05/29/2006 Document Revised: 04/10/2011 Document Reviewed: 07/30/2006 [employer] Patient Information 2014 [employer], [place].   ________________________________________________________________________ WHAT IS A BLOOD TRANSFUSION? Blood Transfusion Information  A  transfusion is the replacement of blood or some of its parts. Blood is made up of multiple cells which provide different functions. Red blood cells carry oxygen  and are used for blood loss replacement. White blood cells fight against infection. Platelets control bleeding. Plasma helps clot blood. Other blood products are available for specialized needs, such as hemophilia or other clotting disorders. BEFORE THE TRANSFUSION  Who gives blood for transfusions?  Healthy volunteers who are fully evaluated to make sure their blood is safe. This is blood bank blood. Transfusion therapy is the safest it has ever been in the practice of medicine. Before blood is taken from a donor, a complete history is taken to make sure that person has no history of diseases nor engages in risky social behavior (examples are intravenous drug use or sexual activity with multiple partners). The donor's travel history is screened to minimize risk of transmitting infections, such as malaria. The donated blood is tested for signs of infectious diseases, such as HIV and hepatitis. The blood is then tested to be sure it is compatible with you in order to minimize the chance of a transfusion reaction. If you or a relative donates blood, this is often done in anticipation of surgery and is not appropriate for emergency situations. It takes many days to process the donated blood. RISKS AND COMPLICATIONS Although transfusion therapy is very safe and saves many lives, the main dangers of transfusion include:  Getting an infectious disease. Developing a transfusion reaction. This is an allergic reaction to something in the blood you were given. Every precaution is taken to prevent this. The decision to have a blood transfusion has been considered carefully by your caregiver before blood is given. Blood is not given unless the benefits outweigh the risks. AFTER THE TRANSFUSION Right after receiving a blood transfusion, you will usually  feel much better and more energetic. This is especially true if your red blood  cells have gotten low (anemic). The transfusion raises the level of the red blood cells which carry oxygen , and this usually causes an energy increase. The nurse administering the transfusion will monitor you carefully for complications. HOME CARE INSTRUCTIONS  No special instructions are needed after a transfusion. You may find your energy is better. Speak with your caregiver about any limitations on activity for underlying diseases you may have. SEEK MEDICAL CARE IF:  Your condition is not improving after your transfusion. You develop redness or irritation at the intravenous (IV) site. SEEK IMMEDIATE MEDICAL CARE IF:  Any of the following symptoms occur over the next 12 hours: Shaking chills. You have a temperature by mouth above 102 F (38.9 C), not controlled by medicine. Chest, back, or muscle pain. People around you feel you are not acting correctly or are confused. Shortness of breath or difficulty breathing. Dizziness and fainting. You get a rash or develop hives. You have a decrease in urine output. Your urine turns a dark color or changes to pink, red, or brown. Any of the following symptoms occur over the next 10 days: You have a temperature by mouth above 102 F (38.9 C), not controlled by medicine. Shortness of breath. Weakness after normal activity. The white part of the eye turns yellow (jaundice). You have a decrease in the amount of urine or are urinating less often. Your urine turns a dark color or changes to pink, red, or brown. Document Released: 01/14/2000 Document Revised: 04/10/2011 Document Reviewed: 09/02/2007 [facility] Patient Information 2014 [place], [place].  _______________________________________________________________________

## 2023-06-08 ENCOUNTER — Encounter (HOSPITAL_COMMUNITY): Payer: Self-pay

## 2023-06-08 ENCOUNTER — Encounter (HOSPITAL_COMMUNITY)
Admission: RE | Admit: 2023-06-08 | Discharge: 2023-06-08 | Disposition: A | Discharge status: Home / Self Care | Source: Ambulatory Visit | Attending: Surgery | Admitting: Surgery

## 2023-06-08 ENCOUNTER — Other Ambulatory Visit: Payer: Self-pay

## 2023-06-08 DIAGNOSIS — Z86718 Personal history of other venous thrombosis and embolism: Secondary | ICD-10-CM | POA: Diagnosis not present

## 2023-06-08 DIAGNOSIS — Z01818 Encounter for other preprocedural examination: Secondary | ICD-10-CM | POA: Insufficient documentation

## 2023-06-08 DIAGNOSIS — I10 Essential (primary) hypertension: Secondary | ICD-10-CM | POA: Insufficient documentation

## 2023-06-08 DIAGNOSIS — I2510 Atherosclerotic heart disease of native coronary artery without angina pectoris: Secondary | ICD-10-CM | POA: Diagnosis not present

## 2023-06-08 DIAGNOSIS — C189 Malignant neoplasm of colon, unspecified: Secondary | ICD-10-CM | POA: Diagnosis not present

## 2023-06-08 DIAGNOSIS — J449 Chronic obstructive pulmonary disease, unspecified: Secondary | ICD-10-CM | POA: Diagnosis not present

## 2023-06-08 DIAGNOSIS — K439 Ventral hernia without obstruction or gangrene: Secondary | ICD-10-CM | POA: Diagnosis not present

## 2023-06-08 DIAGNOSIS — Z87891 Personal history of nicotine dependence: Secondary | ICD-10-CM | POA: Insufficient documentation

## 2023-06-08 HISTORY — DX: Pneumonia, unspecified organism: J18.9

## 2023-06-08 HISTORY — DX: Anxiety disorder, unspecified: F41.9

## 2023-06-08 HISTORY — DX: Anemia, unspecified: D64.9

## 2023-06-08 HISTORY — DX: Dependence on supplemental oxygen: Z99.81

## 2023-06-08 LAB — CBC WITH DIFFERENTIAL/PLATELET
Abs Immature Granulocytes: 0.03 10*3/uL (ref 0.00–0.07)
Basophils Absolute: 0.1 10*3/uL (ref 0.0–0.1)
Basophils Relative: 1 %
Eosinophils Absolute: 0.5 10*3/uL (ref 0.0–0.5)
Eosinophils Relative: 5 %
HCT: 31.2 % — ABNORMAL LOW (ref 36.0–46.0)
Hemoglobin: 8.9 g/dL — ABNORMAL LOW (ref 12.0–15.0)
Immature Granulocytes: 0 %
Lymphocytes Relative: 19 %
Lymphs Abs: 1.8 10*3/uL (ref 0.7–4.0)
MCH: 21 pg — ABNORMAL LOW (ref 26.0–34.0)
MCHC: 28.5 g/dL — ABNORMAL LOW (ref 30.0–36.0)
MCV: 73.8 fL — ABNORMAL LOW (ref 80.0–100.0)
Monocytes Absolute: 0.7 10*3/uL (ref 0.1–1.0)
Monocytes Relative: 7 %
Neutro Abs: 6.8 10*3/uL (ref 1.7–7.7)
Neutrophils Relative %: 68 %
Platelets: 504 10*3/uL — ABNORMAL HIGH (ref 150–400)
RBC: 4.23 MIL/uL (ref 3.87–5.11)
RDW: 17.2 % — ABNORMAL HIGH (ref 11.5–15.5)
WBC: 9.9 10*3/uL (ref 4.0–10.5)
nRBC: 0 % (ref 0.0–0.2)

## 2023-06-08 LAB — COMPREHENSIVE METABOLIC PANEL WITH GFR
ALT: 9 U/L (ref 0–44)
AST: 11 U/L — ABNORMAL LOW (ref 15–41)
Albumin: 3.4 g/dL — ABNORMAL LOW (ref 3.5–5.0)
Alkaline Phosphatase: 75 U/L (ref 38–126)
Anion gap: 10 (ref 5–15)
BUN: 13 mg/dL (ref 8–23)
CO2: 24 mmol/L (ref 22–32)
Calcium: 9.4 mg/dL (ref 8.9–10.3)
Chloride: 103 mmol/L (ref 98–111)
Creatinine, Ser: 0.43 mg/dL — ABNORMAL LOW (ref 0.44–1.00)
GFR, Estimated: >60 mL/min (ref >60)
Glucose, Bld: 100 mg/dL — ABNORMAL HIGH (ref 70–99)
Potassium: 4.4 mmol/L (ref 3.5–5.1)
Sodium: 137 mmol/L (ref 135–145)
Total Bilirubin: 0.5 mg/dL (ref 0.0–1.2)
Total Protein: 6.9 g/dL (ref 6.5–8.1)

## 2023-06-08 NOTE — Progress Notes (Addendum)
 PCP - Aloha Arnold, PA-C Cardiologist - Dr. Odelia Bender 05-04-23 EPIC PULM- Dr. Celene Coins, MD clearance 05-03-23 EPIC  PPM/ICD -  Device Orders -  Rep Notified -   Chest x-ray - CT chest 03-23-23 epic EKG - 05-04-23 epic Stress Test - 12-01-16 ECHO - 01-05-21  Cardiac Cath - 03-03-16  Sleep Study -  CPAP - n/a  Fasting Blood Sugar -  Checks Blood Sugar __n/a___ times a day  Blood Thinner Instructions:n/a Aspirin  Instructions:  ERAS Protcol - PRE-SURGERY Ensure x3  Pt. Has Bowel prep instructions-yes  COVID vaccine -yes  Activity--Able to walk around home with o2  2-3L on with no CP or SOB  Anesthesia review: Nstemi ( pt. Denies), CAD, COPD, HTN, Home o2 2-3L  continuous, Hgb 8.9 ,PLT 504  Patient denies shortness of breath, fever, cough and chest pain at PAT appointment   All instructions explained to the patient, with a verbal understanding of the material. Patient agrees to go over the instructions while at home for a better understanding. Patient also instructed to self quarantine after being tested for COVID-19. The opportunity to ask questions was provided.

## 2023-06-11 NOTE — Progress Notes (Signed)
 Anesthesia Chart Review   Case: [id number] Date/Time: 06/13/23 0815   Procedures:      COLECTOMY, HAND-ASSISTED, LAPAROSCOPIC (Right) - LAPAROSCOPIC RIGHT HEMICOLECTOMY     REPAIR, HERNIA, VENTRAL - VENTRAL HERNIA REPAIR   Anesthesia type: General   Diagnosis:      Malignant neoplasm of colon, unspecified part of colon (HCC) [C18.9]     Ventral hernia without obstruction or gangrene [K43.9]   Pre-op diagnosis: COLON CANCER   Location: [facility] ROOM 02 / [facility]   Surgeons: Melvenia Stabs, MD       DISCUSSION:73 y.o. former smoker with h/o HTN, COPD on 2L home O2, DVT, nonobstructive CAD, colon cancer scheduled for above procedure 06/13/2023 with Dr. Beatris Lincoln.   Per pulmonology preoperative evaluation 05/03/2023, "- Proceed with right hemicolectomy and hernia repair - Perform laparoscopic surgery if possible - Assess lymph nodes during surgery - Monitor for postoperative respiratory complications, early mob & incentive spirometry - Clear for surgery with moderate risk - Pulmonary consultation post-surgery if needed"  Per cardiology preoperative evaluation 05/04/2023, "She does have significant cardiovascular factors in the form of age, prior history of smoking, moderate nonobstructive coronary artery disease noted on prior cardiac cath from February 2018.   She however does not have any significant cardiac symptoms of chest pain, shortness of breath or heart failure and has good cardiac function on prior echocardiogram and no significant valve disease. No significant EKG changes today. Her overall functional status until about 6 months ago was at her baseline for many years.   She remains at elevated cardiovascular risk for complications perioperatively given her risk factors and history as above. Her overall functional status is somewhat limited due to her underlying severe COPD and now with anemia and ongoing abdominal issues. Further cardiac risk stratification at this time is  not expected to change her overall outlook as the surgery is essential given her ongoing symptoms.   - They understand there is a slightly elevated risk for perioperative cardiovascular complications.   Proceed with her urgent noncardiac surgery as being planned. Her overall perioperative risk appears to be significant from pulmonary aspect as well and this has been discussed with her pulmonologist yesterday."   VS: BP 135/74   Pulse 75   Temp 36.8 C (Oral)   Resp 16   Ht 5\' 2"  (1.575 m)   Wt 62.6 kg   SpO2 99%   BMI 25.24 kg/m   PROVIDERS: Aloha Arnold, PA-C is PCP    LABS: Labs reviewed: Acceptable for surgery. (all labs ordered are listed, but only abnormal results are displayed)  Labs Reviewed  CBC WITH DIFFERENTIAL/PLATELET - Abnormal; Notable for the following components:      Result Value   Hemoglobin 8.9 (*)    HCT 31.2 (*)    MCV 73.8 (*)    MCH 21.0 (*)    MCHC 28.5 (*)    RDW 17.2 (*)    Platelets 504 (*)    All other components within normal limits  COMPREHENSIVE METABOLIC PANEL WITH GFR - Abnormal; Notable for the following components:   Glucose, Bld 100 (*)    Creatinine, Ser 0.43 (*)    Albumin 3.4 (*)    AST 11 (*)    All other components within normal limits  TYPE AND SCREEN     IMAGES:   EKG:   CV: Echo 01/05/2021 1. Left ventricular ejection fraction, by estimation, is 60 to 65%. The  left ventricle has normal function. The left  ventricle has no regional  wall motion abnormalities. Left ventricular diastolic parameters are  consistent with Grade I diastolic  dysfunction (impaired relaxation).   2. Right ventricular systolic function is normal. The right ventricular  size is normal.   3. The mitral valve is grossly normal. No evidence of mitral valve  regurgitation.   4. The aortic valve was not well visualized. Aortic valve regurgitation  is not visualized.   5. The inferior vena cava is normal in size with greater than 50%   respiratory variability, suggesting right atrial pressure of 3 mmHg.   Myocardial Perfusion 12/01/2016 Nuclear stress EF: 54%. There was no ST segment deviation noted during stress. This is a low risk study. The left ventricular ejection fraction is mildly decreased (45-54%).   1. EF 54%, normal wall motion.  2. Fixed small, mild basal to mid inferior perfusion defect.  Given normal inferior wall motion, suspect attenuation rather than infarction.  No ischemia noted.    Low risk study.  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   Seen on last chest x-ray 12/2015, not on x-ray from 12/2015     Essential hypertension 06/08/2015   Former smoker 02/24/2022   GERD (gastroesophageal reflux disease) 10/24/2016   H/O steroid therapy 06/08/2015   Hyperlipidemia 12/06/2015   Hypertension    Low back pain    Lower back pain 04/08/2018   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 Denies   On home oxygen  therapy    Pneumonia    2012   Pre-operative respiratory examination 08/31/2020   Umbilical hernia    UTI (urinary tract infection) 04/10/2018    Past Surgical History:  Procedure  Laterality Date   arthroscopic knee surgery Right    CARDIAC CATHETERIZATION N/A 03/03/2016   Procedure: Right/Left Heart Cath and Coronary Angiography;  Surgeon: Arty Binning, MD;  Location: [facility] INVASIVE CV LAB;  Service: Cardiovascular;  Laterality: N/A;   COLONOSCOPY N/A 04/20/2023   Procedure: COLONOSCOPY;  Surgeon: Genell Ken, MD;  Location: [facility] ENDOSCOPY;  Service: Gastroenterology;  Laterality: N/A;   ESOPHAGOGASTRODUODENOSCOPY N/A 04/20/2023   Procedure: EGD (ESOPHAGOGASTRODUODENOSCOPY);  Surgeon: Genell Ken, MD;  Location: [facility] ENDOSCOPY;  Service: Gastroenterology;  Laterality: N/A;   HEMOSTASIS CLIP PLACEMENT  04/20/2023   Procedure: CONTROL OF HEMORRHAGE, GI TRACT, ENDOSCOPIC, BY CLIPPING OR OVERSEWING;  Surgeon: Genell Ken, MD;  Location: [facility] ENDOSCOPY;  Service: Gastroenterology;;   POLYPECTOMY  04/20/2023   Procedure: POLYPECTOMY;  Surgeon: Genell Ken, MD;  Location: [facility] ENDOSCOPY;  Service: Gastroenterology;;   TUBAL LIGATION      MEDICATIONS:  acetaminophen  (TYLENOL ) 500 MG tablet   albuterol  (PROVENTIL ) (2.5 MG/3ML) 0.083% nebulizer solution   albuterol  (VENTOLIN  HFA) 108 (90 Base) MCG/ACT inhaler   atorvastatin  (LIPITOR) 40 MG tablet   Budeson-Glycopyrrol-Formoterol  (BREZTRI  AEROSPHERE) 160-9-4.8 MCG/ACT AERO   escitalopram (LEXAPRO) 10 MG tablet   Ferrous Sulfate (IRON PO)   montelukast  (SINGULAIR ) 10 MG tablet   roflumilast  (DALIRESP ) 500 MCG TABS tablet   No current facility-administered medications for this encounter.    Chick Cotton Ward, PA-C [facility] Pre-Surgical Testing [phone]

## 2023-06-11 NOTE — Anesthesia Preprocedure Evaluation (Signed)
 Anesthesia Evaluation  Patient identified by MRN, date of birth, ID band Patient awake    Reviewed: Allergy  & Precautions, NPO status , Patient's Chart, lab work & pertinent test results  History of Anesthesia Complications Negative for: history of anesthetic complications  Airway Mallampati: II  TM Distance: >3 FB Neck ROM: Full    Dental  (+) Edentulous Upper, Missing,    Pulmonary COPD (2LPM [place] home oxygen ),  COPD inhaler and oxygen  dependent, former smoker Quit smoking 2012, 69 pack year history  "Moderate risk" per pulm   Pulmonary exam normal        Cardiovascular hypertension, + CAD (cath 2018) and + Past MI (NSTEMI 2017)  Normal cardiovascular exam  Echo 01/05/2021: LVEF 60-65%, grade 1 diastolic dysfunction, normal valves   Myocardial Perfusion 12/01/2016  Nuclear stress EF: 54%.  There was no ST segment deviation noted during stress.  This is a low risk study.  The left ventricular ejection fraction is mildly decreased (45-54%). 1. EF 54%, normal wall motion.  2. Fixed small, mild basal to mid inferior perfusion defect.  Given normal inferior wall motion, suspect attenuation rather than infarction.  No ischemia noted.  Low risk study.   Cath 2018:  Moderate proximal to mid LAD disease range and in the 50-75% range. The LAD supplies only the anterior wall down to the distal third. First septal perforator contains 80% obstruction.  Circumflex is large and contains luminal irregularities.  RCA is dominant pink and pains luminal irregularities.  LV function is normal. Filling pressures are normal.  Normal right heart pressures.    Neuro/Psych  PSYCHIATRIC DISORDERS Anxiety     negative neurological ROS     GI/Hepatic Neg liver ROS,GERD  ,,CRC   Endo/Other  negative endocrine ROS    Renal/GU negative Renal ROS  negative genitourinary   Musculoskeletal negative musculoskeletal ROS (+)    Abdominal    Peds  Hematology  (+) Blood dyscrasia, anemia Hb 8.9, plt 504   Anesthesia Other Findings   Reproductive/Obstetrics negative OB ROS                              Anesthesia Physical Anesthesia Plan  ASA: 4  Anesthesia Plan: General   Post-op Pain Management: Tylenol  PO (pre-op)*   Induction: Intravenous  PONV Risk Score and Plan: 4 or greater and Ondansetron , Dexamethasone and Treatment may vary due to age or medical condition  Airway Management Planned: Oral ETT  Additional Equipment: None  Intra-op Plan:   Post-operative Plan: Extubation in OR  Informed Consent: I have reviewed the patients History and Physical, chart, labs and discussed the procedure including the risks, benefits and alternatives for the proposed anesthesia with the patient or authorized representative who has indicated his/her understanding and acceptance.     Dental advisory given  Plan Discussed with: CRNA  Anesthesia Plan Comments:         Anesthesia Quick Evaluation

## 2023-06-12 ENCOUNTER — Encounter (HOSPITAL_COMMUNITY): Payer: Self-pay | Admitting: Surgery

## 2023-06-12 LAB — TYPE AND SCREEN
ABO/RH(D): O NEG
Antibody Screen: NEGATIVE

## 2023-06-13 ENCOUNTER — Other Ambulatory Visit: Payer: Self-pay

## 2023-06-13 ENCOUNTER — Inpatient Hospital Stay (HOSPITAL_COMMUNITY): Payer: Self-pay | Admitting: Physician Assistant

## 2023-06-13 ENCOUNTER — Inpatient Hospital Stay (HOSPITAL_COMMUNITY)
Admission: RE | Admit: 2023-06-13 | Discharge: 2023-06-16 | DRG: 330 | Disposition: A | Discharge status: Home / Self Care | Attending: Surgery | Admitting: Surgery

## 2023-06-13 ENCOUNTER — Encounter (HOSPITAL_COMMUNITY): Payer: Self-pay | Admitting: Surgery

## 2023-06-13 ENCOUNTER — Inpatient Hospital Stay (HOSPITAL_COMMUNITY): Payer: Self-pay | Admitting: Anesthesiology

## 2023-06-13 ENCOUNTER — Encounter (HOSPITAL_COMMUNITY)
Admission: RE | Disposition: A | Discharge status: Home / Self Care | Payer: Self-pay | Source: Home / Self Care | Attending: Surgery

## 2023-06-13 DIAGNOSIS — I252 Old myocardial infarction: Secondary | ICD-10-CM

## 2023-06-13 DIAGNOSIS — Z9981 Dependence on supplemental oxygen: Secondary | ICD-10-CM

## 2023-06-13 DIAGNOSIS — Z9049 Acquired absence of other specified parts of digestive tract: Secondary | ICD-10-CM | POA: Diagnosis not present

## 2023-06-13 DIAGNOSIS — Z825 Family history of asthma and other chronic lower respiratory diseases: Secondary | ICD-10-CM

## 2023-06-13 DIAGNOSIS — J449 Chronic obstructive pulmonary disease, unspecified: Secondary | ICD-10-CM | POA: Diagnosis not present

## 2023-06-13 DIAGNOSIS — C20 Malignant neoplasm of rectum: Secondary | ICD-10-CM | POA: Diagnosis present

## 2023-06-13 DIAGNOSIS — I10 Essential (primary) hypertension: Secondary | ICD-10-CM | POA: Diagnosis not present

## 2023-06-13 DIAGNOSIS — K5900 Constipation, unspecified: Secondary | ICD-10-CM | POA: Diagnosis not present

## 2023-06-13 DIAGNOSIS — F32A Depression, unspecified: Secondary | ICD-10-CM | POA: Diagnosis present

## 2023-06-13 DIAGNOSIS — Z86718 Personal history of other venous thrombosis and embolism: Secondary | ICD-10-CM | POA: Diagnosis not present

## 2023-06-13 DIAGNOSIS — Z87891 Personal history of nicotine dependence: Secondary | ICD-10-CM | POA: Diagnosis not present

## 2023-06-13 DIAGNOSIS — J9611 Chronic respiratory failure with hypoxia: Secondary | ICD-10-CM | POA: Diagnosis present

## 2023-06-13 DIAGNOSIS — F419 Anxiety disorder, unspecified: Secondary | ICD-10-CM | POA: Diagnosis present

## 2023-06-13 DIAGNOSIS — Z79899 Other long term (current) drug therapy: Secondary | ICD-10-CM

## 2023-06-13 DIAGNOSIS — C183 Malignant neoplasm of hepatic flexure: Secondary | ICD-10-CM | POA: Diagnosis not present

## 2023-06-13 DIAGNOSIS — C189 Malignant neoplasm of colon, unspecified: Secondary | ICD-10-CM

## 2023-06-13 DIAGNOSIS — K429 Umbilical hernia without obstruction or gangrene: Secondary | ICD-10-CM | POA: Diagnosis present

## 2023-06-13 DIAGNOSIS — G8918 Other acute postprocedural pain: Secondary | ICD-10-CM | POA: Diagnosis not present

## 2023-06-13 DIAGNOSIS — K5730 Diverticulosis of large intestine without perforation or abscess without bleeding: Secondary | ICD-10-CM | POA: Diagnosis present

## 2023-06-13 DIAGNOSIS — K432 Incisional hernia without obstruction or gangrene: Secondary | ICD-10-CM | POA: Diagnosis not present

## 2023-06-13 DIAGNOSIS — J986 Disorders of diaphragm: Secondary | ICD-10-CM | POA: Diagnosis present

## 2023-06-13 DIAGNOSIS — I2510 Atherosclerotic heart disease of native coronary artery without angina pectoris: Secondary | ICD-10-CM | POA: Diagnosis present

## 2023-06-13 DIAGNOSIS — Z87440 Personal history of urinary (tract) infections: Secondary | ICD-10-CM

## 2023-06-13 DIAGNOSIS — D62 Acute posthemorrhagic anemia: Secondary | ICD-10-CM | POA: Diagnosis not present

## 2023-06-13 DIAGNOSIS — K439 Ventral hernia without obstruction or gangrene: Secondary | ICD-10-CM

## 2023-06-13 DIAGNOSIS — K219 Gastro-esophageal reflux disease without esophagitis: Secondary | ICD-10-CM | POA: Diagnosis present

## 2023-06-13 DIAGNOSIS — Z9851 Tubal ligation status: Secondary | ICD-10-CM

## 2023-06-13 DIAGNOSIS — Z006 Encounter for examination for normal comparison and control in clinical research program: Secondary | ICD-10-CM

## 2023-06-13 DIAGNOSIS — Z8701 Personal history of pneumonia (recurrent): Secondary | ICD-10-CM

## 2023-06-13 DIAGNOSIS — Z7951 Long term (current) use of inhaled steroids: Secondary | ICD-10-CM

## 2023-06-13 DIAGNOSIS — E785 Hyperlipidemia, unspecified: Secondary | ICD-10-CM | POA: Diagnosis not present

## 2023-06-13 DIAGNOSIS — J439 Emphysema, unspecified: Secondary | ICD-10-CM | POA: Diagnosis present

## 2023-06-13 HISTORY — PX: VENTRAL HERNIA REPAIR: SHX424

## 2023-06-13 HISTORY — PX: COLON RESECTION: SHX5231

## 2023-06-13 LAB — ABO/RH: ABO/RH(D): O NEG

## 2023-06-13 SURGERY — COLECTOMY, HAND-ASSISTED, LAPAROSCOPIC
Anesthesia: General | Laterality: Right

## 2023-06-13 MED ORDER — ENSURE SURGERY PO LIQD
237.0000 mL | Freq: Two times a day (BID) | ORAL | Status: DC
  Administered 2023-06-13 – 2023-06-16 (×6): 237 mL via ORAL

## 2023-06-13 MED ORDER — ONDANSETRON HCL 4 MG PO TABS: 4.0000 mg | ORAL_TABLET | Freq: Four times a day (QID) | ORAL | Status: DC | PRN

## 2023-06-13 MED ORDER — SUGAMMADEX SODIUM 200 MG/2ML IV SOLN
INTRAVENOUS | Status: DC | PRN
  Administered 2023-06-13: 200 mg via INTRAVENOUS

## 2023-06-13 MED ORDER — BUPIVACAINE-EPINEPHRINE 0.25% -1:200000 IJ SOLN
INTRAMUSCULAR | Status: DC | PRN
  Administered 2023-06-13: 30 mL

## 2023-06-13 MED ORDER — LACTATED RINGERS IV SOLN: INTRAVENOUS | Status: DC

## 2023-06-13 MED ORDER — DEXAMETHASONE SODIUM PHOSPHATE 10 MG/ML IJ SOLN
INTRAMUSCULAR | Status: AC
  Filled 2023-06-13: qty 1

## 2023-06-13 MED ORDER — DEXMEDETOMIDINE HCL IN NACL 80 MCG/20ML IV SOLN
INTRAVENOUS | Status: DC | PRN
  Administered 2023-06-13 (×2): 8 ug via INTRAVENOUS

## 2023-06-13 MED ORDER — ONDANSETRON HCL 4 MG/2ML IJ SOLN
INTRAMUSCULAR | Status: AC
  Filled 2023-06-13: qty 2

## 2023-06-13 MED ORDER — ALBUMIN HUMAN 5 % IV SOLN
INTRAVENOUS | Status: AC
  Filled 2023-06-13: qty 250

## 2023-06-13 MED ORDER — PROPOFOL 500 MG/50ML IV EMUL
INTRAVENOUS | Status: AC
Start: 2023-06-13 — End: ?
  Filled 2023-06-13: qty 50

## 2023-06-13 MED ORDER — ALUM & MAG HYDROXIDE-SIMETH 200-200-20 MG/5ML PO SUSP: 30.0000 mL | Freq: Four times a day (QID) | ORAL | Status: DC | PRN

## 2023-06-13 MED ORDER — ROFLUMILAST 500 MCG PO TABS
500.0000 ug | ORAL_TABLET | Freq: Every day | ORAL | Status: DC
  Administered 2023-06-13 – 2023-06-16 (×4): 500 ug via ORAL
  Filled 2023-06-13 (×4): qty 1

## 2023-06-13 MED ORDER — CHLORHEXIDINE GLUCONATE 0.12 % MT SOLN
15.0000 mL | Freq: Once | OROMUCOSAL | Status: AC
  Administered 2023-06-13: 15 mL via OROMUCOSAL

## 2023-06-13 MED ORDER — HYDROMORPHONE HCL 1 MG/ML IJ SOLN
INTRAMUSCULAR | Status: AC
  Filled 2023-06-13: qty 1

## 2023-06-13 MED ORDER — ENSURE PRE-SURGERY PO LIQD
592.0000 mL | Freq: Once | ORAL | Status: DC
Start: 2023-06-13 — End: 2023-06-13

## 2023-06-13 MED ORDER — TRAMADOL HCL 50 MG PO TABS
50.0000 mg | ORAL_TABLET | Freq: Four times a day (QID) | ORAL | Status: DC | PRN
  Administered 2023-06-13 – 2023-06-15 (×5): 50 mg via ORAL
  Filled 2023-06-13 (×5): qty 1

## 2023-06-13 MED ORDER — CHLORHEXIDINE GLUCONATE CLOTH 2 % EX PADS: 6.0000 | MEDICATED_PAD | Freq: Once | CUTANEOUS | Status: DC

## 2023-06-13 MED ORDER — SIMETHICONE 80 MG PO CHEW: 40.0000 mg | CHEWABLE_TABLET | Freq: Four times a day (QID) | ORAL | Status: DC | PRN

## 2023-06-13 MED ORDER — HEPARIN SODIUM (PORCINE) 5000 UNIT/ML IJ SOLN
5000.0000 [IU] | Freq: Three times a day (TID) | INTRAMUSCULAR | Status: DC
  Administered 2023-06-13 – 2023-06-16 (×8): 5000 [IU] via SUBCUTANEOUS
  Filled 2023-06-13 (×8): qty 1

## 2023-06-13 MED ORDER — PHENYLEPHRINE HCL-NACL 20-0.9 MG/250ML-% IV SOLN
INTRAVENOUS | Status: AC
  Filled 2023-06-13: qty 250

## 2023-06-13 MED ORDER — ONDANSETRON HCL 4 MG/2ML IJ SOLN
INTRAMUSCULAR | Status: DC | PRN
  Administered 2023-06-13: 4 mg via INTRAVENOUS

## 2023-06-13 MED ORDER — ACETAMINOPHEN 500 MG PO TABS
1000.0000 mg | ORAL_TABLET | ORAL | Status: AC
  Administered 2023-06-13: 1000 mg via ORAL
  Filled 2023-06-13: qty 2

## 2023-06-13 MED ORDER — KETAMINE HCL 50 MG/5ML IJ SOSY
PREFILLED_SYRINGE | INTRAMUSCULAR | Status: DC | PRN
Start: 2023-06-13 — End: 2023-06-13
  Administered 2023-06-13 (×2): 10 mg via INTRAVENOUS

## 2023-06-13 MED ORDER — DEXAMETHASONE SODIUM PHOSPHATE 10 MG/ML IJ SOLN
INTRAMUSCULAR | Status: DC | PRN
  Administered 2023-06-13: 10 mg via INTRAVENOUS

## 2023-06-13 MED ORDER — FENTANYL CITRATE (PF) 100 MCG/2ML IJ SOLN
INTRAMUSCULAR | Status: DC | PRN
Start: 2023-06-13 — End: 2023-06-13
  Administered 2023-06-13: 50 ug via INTRAVENOUS
  Administered 2023-06-13 (×2): 25 ug via INTRAVENOUS

## 2023-06-13 MED ORDER — BUPIVACAINE LIPOSOME 1.3 % IJ SUSP
INTRAMUSCULAR | Status: AC
  Filled 2023-06-13: qty 20

## 2023-06-13 MED ORDER — ALVIMOPAN 12 MG PO CAPS
12.0000 mg | ORAL_CAPSULE | Freq: Two times a day (BID) | ORAL | Status: DC
  Administered 2023-06-14 – 2023-06-15 (×3): 12 mg via ORAL
  Filled 2023-06-13 (×3): qty 1

## 2023-06-13 MED ORDER — ALVIMOPAN 12 MG PO CAPS
12.0000 mg | ORAL_CAPSULE | ORAL | Status: AC
  Administered 2023-06-13: 12 mg via ORAL
  Filled 2023-06-13: qty 1

## 2023-06-13 MED ORDER — IBUPROFEN 400 MG PO TABS: 600.0000 mg | ORAL_TABLET | Freq: Four times a day (QID) | ORAL | Status: DC | PRN

## 2023-06-13 MED ORDER — DIPHENHYDRAMINE HCL 12.5 MG/5ML PO ELIX: 12.5000 mg | ORAL_SOLUTION | Freq: Four times a day (QID) | ORAL | Status: DC | PRN

## 2023-06-13 MED ORDER — BUPIVACAINE LIPOSOME 1.3 % IJ SUSP
INTRAMUSCULAR | Status: DC | PRN
  Administered 2023-06-13: 20 mL

## 2023-06-13 MED ORDER — AMISULPRIDE (ANTIEMETIC) 5 MG/2ML IV SOLN: 10.0000 mg | Freq: Once | INTRAVENOUS | Status: DC | PRN

## 2023-06-13 MED ORDER — HYDRALAZINE HCL 20 MG/ML IJ SOLN: 10.0000 mg | INTRAMUSCULAR | Status: DC | PRN

## 2023-06-13 MED ORDER — BUDESON-GLYCOPYRROL-FORMOTEROL 160-9-4.8 MCG/ACT IN AERO
2.0000 | INHALATION_SPRAY | Freq: Two times a day (BID) | RESPIRATORY_TRACT | Status: DC
Start: 2023-06-13 — End: 2023-06-16
  Administered 2023-06-13 – 2023-06-16 (×6): 2 via RESPIRATORY_TRACT
  Filled 2023-06-13: qty 5.9

## 2023-06-13 MED ORDER — FERROUS SULFATE 325 (65 FE) MG PO TABS
325.0000 mg | ORAL_TABLET | Freq: Every day | ORAL | Status: DC
  Administered 2023-06-14 – 2023-06-16 (×3): 325 mg via ORAL
  Filled 2023-06-13 (×3): qty 1

## 2023-06-13 MED ORDER — LIDOCAINE HCL (PF) 2 % IJ SOLN
INTRAMUSCULAR | Status: AC
  Filled 2023-06-13: qty 5

## 2023-06-13 MED ORDER — OXYCODONE HCL 5 MG PO TABS: 5.0000 mg | ORAL_TABLET | Freq: Once | ORAL | Status: DC | PRN

## 2023-06-13 MED ORDER — HYDROMORPHONE HCL 1 MG/ML IJ SOLN
0.2500 mg | INTRAMUSCULAR | Status: DC | PRN
  Administered 2023-06-13 (×2): 0.25 mg via INTRAVENOUS

## 2023-06-13 MED ORDER — BISACODYL 5 MG PO TBEC: 20.0000 mg | DELAYED_RELEASE_TABLET | Freq: Once | ORAL | Status: DC

## 2023-06-13 MED ORDER — 0.9 % SODIUM CHLORIDE (POUR BTL) OPTIME
TOPICAL | Status: DC | PRN
  Administered 2023-06-13: 2000 mL

## 2023-06-13 MED ORDER — KETAMINE HCL 50 MG/5ML IJ SOSY
PREFILLED_SYRINGE | INTRAMUSCULAR | Status: AC
  Filled 2023-06-13: qty 5

## 2023-06-13 MED ORDER — ENSURE PRE-SURGERY PO LIQD: 296.0000 mL | Freq: Once | ORAL | Status: DC

## 2023-06-13 MED ORDER — LIDOCAINE HCL (CARDIAC) PF 100 MG/5ML IV SOSY
PREFILLED_SYRINGE | INTRAVENOUS | Status: DC | PRN
  Administered 2023-06-13: 60 mg via INTRAVENOUS

## 2023-06-13 MED ORDER — ALBUTEROL SULFATE HFA 108 (90 BASE) MCG/ACT IN AERS
INHALATION_SPRAY | RESPIRATORY_TRACT | Status: AC
  Filled 2023-06-13: qty 6.7

## 2023-06-13 MED ORDER — ESCITALOPRAM OXALATE 20 MG PO TABS
10.0000 mg | ORAL_TABLET | Freq: Every day | ORAL | Status: DC
  Administered 2023-06-14 – 2023-06-16 (×3): 10 mg via ORAL
  Filled 2023-06-13 (×3): qty 1

## 2023-06-13 MED ORDER — ROCURONIUM BROMIDE 100 MG/10ML IV SOLN
INTRAVENOUS | Status: DC | PRN
  Administered 2023-06-13: 50 mg via INTRAVENOUS
  Administered 2023-06-13: 10 mg via INTRAVENOUS

## 2023-06-13 MED ORDER — ONDANSETRON HCL 4 MG/2ML IJ SOLN: 4.0000 mg | Freq: Once | INTRAMUSCULAR | Status: DC | PRN

## 2023-06-13 MED ORDER — EPHEDRINE SULFATE-NACL 50-0.9 MG/10ML-% IV SOSY
PREFILLED_SYRINGE | INTRAVENOUS | Status: DC | PRN
  Administered 2023-06-13: 5 mg via INTRAVENOUS

## 2023-06-13 MED ORDER — ACETAMINOPHEN 500 MG PO TABS: 1000.0000 mg | ORAL_TABLET | Freq: Once | ORAL | Status: DC

## 2023-06-13 MED ORDER — ALBUTEROL SULFATE HFA 108 (90 BASE) MCG/ACT IN AERS
INHALATION_SPRAY | RESPIRATORY_TRACT | Status: DC | PRN
  Administered 2023-06-13: 6 via RESPIRATORY_TRACT

## 2023-06-13 MED ORDER — NEOMYCIN SULFATE 500 MG PO TABS: 1000.0000 mg | ORAL_TABLET | ORAL | Status: DC

## 2023-06-13 MED ORDER — ONDANSETRON HCL 4 MG/2ML IJ SOLN: 4.0000 mg | Freq: Four times a day (QID) | INTRAMUSCULAR | Status: DC | PRN

## 2023-06-13 MED ORDER — FENTANYL CITRATE (PF) 100 MCG/2ML IJ SOLN
INTRAMUSCULAR | Status: AC
  Filled 2023-06-13: qty 2

## 2023-06-13 MED ORDER — ORAL CARE MOUTH RINSE: 15.0000 mL | Freq: Once | OROMUCOSAL | Status: AC

## 2023-06-13 MED ORDER — HEPARIN SODIUM (PORCINE) 5000 UNIT/ML IJ SOLN
5000.0000 [IU] | Freq: Once | INTRAMUSCULAR | Status: AC
  Administered 2023-06-13: 5000 [IU] via SUBCUTANEOUS
  Filled 2023-06-13: qty 1

## 2023-06-13 MED ORDER — MONTELUKAST SODIUM 10 MG PO TABS
10.0000 mg | ORAL_TABLET | Freq: Every day | ORAL | Status: DC
  Administered 2023-06-13 – 2023-06-15 (×3): 10 mg via ORAL
  Filled 2023-06-13 (×3): qty 1

## 2023-06-13 MED ORDER — ALBUTEROL SULFATE (2.5 MG/3ML) 0.083% IN NEBU: 2.5000 mg | INHALATION_SOLUTION | Freq: Four times a day (QID) | RESPIRATORY_TRACT | Status: DC | PRN

## 2023-06-13 MED ORDER — BUPIVACAINE LIPOSOME 1.3 % IJ SUSP: 20.0000 mL | Freq: Once | INTRAMUSCULAR | Status: DC

## 2023-06-13 MED ORDER — BUPIVACAINE-EPINEPHRINE (PF) 0.25% -1:200000 IJ SOLN
INTRAMUSCULAR | Status: AC
  Filled 2023-06-13: qty 30

## 2023-06-13 MED ORDER — DIPHENHYDRAMINE HCL 50 MG/ML IJ SOLN: 12.5000 mg | Freq: Four times a day (QID) | INTRAMUSCULAR | Status: DC | PRN

## 2023-06-13 MED ORDER — SODIUM CHLORIDE 0.9 % IV SOLN
2.0000 g | INTRAVENOUS | Status: AC
  Administered 2023-06-13: 2 g via INTRAVENOUS
  Filled 2023-06-13: qty 2

## 2023-06-13 MED ORDER — METRONIDAZOLE 500 MG PO TABS: 1000.0000 mg | ORAL_TABLET | ORAL | Status: DC

## 2023-06-13 MED ORDER — POLYETHYLENE GLYCOL 3350 17 GM/SCOOP PO POWD: 238.0000 g | Freq: Once | ORAL | Status: DC

## 2023-06-13 MED ORDER — ATORVASTATIN CALCIUM 20 MG PO TABS
40.0000 mg | ORAL_TABLET | Freq: Every day | ORAL | Status: DC
  Administered 2023-06-14 – 2023-06-16 (×3): 40 mg via ORAL
  Filled 2023-06-13 (×3): qty 2

## 2023-06-13 MED ORDER — PROPOFOL 10 MG/ML IV BOLUS
INTRAVENOUS | Status: DC | PRN
  Administered 2023-06-13: 80 mg via INTRAVENOUS
  Administered 2023-06-13 (×2): 20 mg via INTRAVENOUS

## 2023-06-13 MED ORDER — ALBUMIN HUMAN 5 % IV SOLN: INTRAVENOUS | Status: DC | PRN

## 2023-06-13 MED ORDER — ACETAMINOPHEN 500 MG PO TABS
1000.0000 mg | ORAL_TABLET | Freq: Four times a day (QID) | ORAL | Status: DC
  Administered 2023-06-13 – 2023-06-16 (×11): 1000 mg via ORAL
  Filled 2023-06-13 (×11): qty 2

## 2023-06-13 MED ORDER — HYDROMORPHONE HCL 1 MG/ML IJ SOLN
0.5000 mg | INTRAMUSCULAR | Status: DC | PRN
  Administered 2023-06-14 (×2): 0.5 mg via INTRAVENOUS
  Filled 2023-06-13 (×2): qty 0.5

## 2023-06-13 MED ORDER — OXYCODONE HCL 5 MG/5ML PO SOLN: 5.0000 mg | Freq: Once | ORAL | Status: DC | PRN

## 2023-06-13 SURGICAL SUPPLY — 75 items
BAG COUNTER SPONGE SURGICOUNT (BAG) IMPLANT
BLADE EXTENDED COATED 6.5IN (ELECTRODE) IMPLANT
CABLE HIGH FREQUENCY MONO STRZ (ELECTRODE) IMPLANT
CATH MUSHROOM 30FR (CATHETERS) IMPLANT
CELLS DAT CNTRL 66122 CELL SVR (MISCELLANEOUS) IMPLANT
CHLORAPREP W/TINT 26 (MISCELLANEOUS) ×2 IMPLANT
CLIP APPLIE 5 13 M/L LIGAMAX5 (MISCELLANEOUS) IMPLANT
CLIP APPLIE ROT 10 11.4 M/L (STAPLE) IMPLANT
COVER SURGICAL LIGHT HANDLE (MISCELLANEOUS) ×2 IMPLANT
DERMABOND ADVANCED .7 DNX12 (GAUZE/BANDAGES/DRESSINGS) ×2 IMPLANT
DERMABOND ADVANCED .7 DNX6 (GAUZE/BANDAGES/DRESSINGS) IMPLANT
DISSECTOR BLUNT TIP ENDO 5MM (MISCELLANEOUS) IMPLANT
DRAIN CHANNEL 19F RND (DRAIN) IMPLANT
DRAPE LAPAROSCOPIC ABDOMINAL (DRAPES) ×2 IMPLANT
DRAPE SURG IRRIG POUCH 19X23 (DRAPES) ×2 IMPLANT
DRSG OPSITE POSTOP 4X10 (GAUZE/BANDAGES/DRESSINGS) IMPLANT
DRSG OPSITE POSTOP 4X6 (GAUZE/BANDAGES/DRESSINGS) IMPLANT
DRSG OPSITE POSTOP 4X8 (GAUZE/BANDAGES/DRESSINGS) IMPLANT
ELECT REM PT RETURN 15FT ADLT (MISCELLANEOUS) ×2 IMPLANT
EVACUATOR SILICONE 100CC (DRAIN) IMPLANT
GAUZE SPONGE 4X4 12PLY STRL (GAUZE/BANDAGES/DRESSINGS) IMPLANT
GLOVE BIO SURGEON STRL SZ7.5 (GLOVE) ×4 IMPLANT
GLOVE INDICATOR 8.0 STRL GRN (GLOVE) ×4 IMPLANT
GOWN STRL REUS W/ TWL XL LVL3 (GOWN DISPOSABLE) ×8 IMPLANT
HOLDER FOLEY CATH W/STRAP (MISCELLANEOUS) ×2 IMPLANT
IRRIGATION SUCT STRKRFLW 2 WTP (MISCELLANEOUS) ×2 IMPLANT
KIT BASIN OR (CUSTOM PROCEDURE TRAY) ×2 IMPLANT
KIT TURNOVER KIT A (KITS) IMPLANT
LIGASURE IMPACT 36 18CM CVD LR (INSTRUMENTS) IMPLANT
NDL HYPO 22X1.5 SAFETY MO (MISCELLANEOUS) IMPLANT
NDL INSUFFLATION 14GA 120MM (NEEDLE) IMPLANT
NEEDLE HYPO 22X1.5 SAFETY MO (MISCELLANEOUS) IMPLANT
NEEDLE INSUFFLATION 14GA 120MM (NEEDLE) IMPLANT
NS IRRIG 1000ML POUR BTL (IV SOLUTION) ×2 IMPLANT
PACK COLON (CUSTOM PROCEDURE TRAY) ×2 IMPLANT
PACK GENERAL/GYN (CUSTOM PROCEDURE TRAY) ×2 IMPLANT
PAD POSITIONING PINK XL (MISCELLANEOUS) ×2 IMPLANT
PENCIL SMOKE EVACUATOR (MISCELLANEOUS) IMPLANT
RELOAD PROXIMATE 75MM BLUE (ENDOMECHANICALS) ×4 IMPLANT
RELOAD STAPLE 75 3.8 BLU REG (ENDOMECHANICALS) IMPLANT
RETRACTOR WND ALEXIS 18 MED (MISCELLANEOUS) IMPLANT
SCISSORS LAP 5X35 DISP (ENDOMECHANICALS) ×2 IMPLANT
SEALER TISSUE G2 STRG ARTC 35C (ENDOMECHANICALS) ×2 IMPLANT
SET TUBE SMOKE EVAC HIGH FLOW (TUBING) ×2 IMPLANT
SLEEVE ADV FIXATION 5X100MM (TROCAR) ×4 IMPLANT
SPIKE FLUID TRANSFER (MISCELLANEOUS) ×2 IMPLANT
SPONGE DRAIN TRACH 4X4 STRL 2S (GAUZE/BANDAGES/DRESSINGS) IMPLANT
STAPLER 90 3.5 STD SLIM (STAPLE) IMPLANT
STAPLER GUN LINEAR PROX 60 (STAPLE) IMPLANT
STAPLER PROXIMATE 75MM BLUE (STAPLE) IMPLANT
STAPLER SKIN PROX 35W (STAPLE) IMPLANT
SUT ETHIBOND 0 MO6 C/R (SUTURE) ×2 IMPLANT
SUT ETHILON 3 0 PS 1 (SUTURE) IMPLANT
SUT MNCRL AB 4-0 PS2 18 (SUTURE) IMPLANT
SUT PDS AB 1 CT1 27 (SUTURE) IMPLANT
SUT PDS AB 1 TP1 54 (SUTURE) IMPLANT
SUT PDS AB 1 TP1 96 (SUTURE) IMPLANT
SUT PROLENE 2 0 KS (SUTURE) ×2 IMPLANT
SUT PROLENE 2 0 SH DA (SUTURE) ×2 IMPLANT
SUT SILK 2 0 SH CR/8 (SUTURE) ×2 IMPLANT
SUT SILK 2-0 18XBRD TIE 12 (SUTURE) ×2 IMPLANT
SUT SILK 3 0 SH CR/8 (SUTURE) ×2 IMPLANT
SUT SILK 3-0 18XBRD TIE 12 (SUTURE) ×2 IMPLANT
SUT VIC AB 2-0 SH 27X BRD (SUTURE) IMPLANT
SUT VIC AB 3-0 SH 18 (SUTURE) IMPLANT
SUT VIC AB 3-0 SH 27X BRD (SUTURE) IMPLANT
SUT VICRYL 0 UR6 27IN ABS (SUTURE) IMPLANT
SUT VICRYL AB 2 0 TIES (SUTURE) ×2 IMPLANT
SYR 20ML LL LF (SYRINGE) IMPLANT
SYSTEM LAPSCP GELPORT 120MM (MISCELLANEOUS) IMPLANT
SYSTEM WOUND ALEXIS 18CM MED (MISCELLANEOUS) IMPLANT
TOWEL OR 17X26 10 PK STRL BLUE (TOWEL DISPOSABLE) ×2 IMPLANT
TRAY IRRIG W/60CC SYR STRL (SET/KITS/TRAYS/PACK) ×2 IMPLANT
TROCAR ADV FIXATION 5X100MM (TROCAR) ×2 IMPLANT
TROCAR BALLN 12MMX100 BLUNT (TROCAR) IMPLANT

## 2023-06-13 NOTE — Anesthesia Procedure Notes (Signed)
 Procedure Name: Intubation Date/Time: 06/13/2023 8:32 AM  Performed by: Judd Northern, RNPre-anesthesia Checklist: Patient identified, Emergency Drugs available, Suction available, Patient being monitored and Timeout performed Patient Re-evaluated:Patient Re-evaluated prior to induction Oxygen  Delivery Method: Circle system utilized Preoxygenation: Pre-oxygenation with 100% oxygen  Induction Type: IV induction Ventilation: Mask ventilation without difficulty Laryngoscope Size: Mac and 3 Grade View: Grade I Tube type: Oral Tube size: 7.0 mm Number of attempts: 1 Airway Equipment and Method: Stylet Placement Confirmation: ETT inserted through vocal cords under direct vision, positive ETCO2, CO2 detector and breath sounds checked- equal and bilateral Secured at: 22 cm Tube secured with: Tape Dental Injury: Teeth and Oropharynx as per pre-operative assessment

## 2023-06-13 NOTE — Transfer of Care (Signed)
 Immediate Anesthesia Transfer of Care Note  Patient: Erin Morrison  Procedure(s) Performed: COLECTOMY, HAND-ASSISTED, LAPAROSCOPIC (Right) REPAIR, HERNIA, VENTRAL  Patient Location: PACU  Anesthesia Type:General  Level of Consciousness: sedated  Airway & Oxygen  Therapy: Patient Spontanous Breathing and Patient connected to face mask oxygen   Post-op Assessment: Report given to RN and Post -op Vital signs reviewed and stable  Post vital signs: Reviewed and stable  Last Vitals:  Vitals Value Taken Time  BP 146/70 06/13/23 1037  Temp    Pulse 72 06/13/23 1040  Resp 14 06/13/23 1040  SpO2 97 % 06/13/23 1040  Vitals shown include unfiled device data.  Last Pain:  Vitals:   06/13/23 0650  TempSrc:   PainSc: 0-No pain         Complications: No notable events documented.

## 2023-06-13 NOTE — Anesthesia Postprocedure Evaluation (Signed)
 Anesthesia Post Note  Patient: Erin Morrison  Procedure(s) Performed: COLECTOMY, HAND-ASSISTED, LAPAROSCOPIC (Right) REPAIR, HERNIA, VENTRAL     Patient location during evaluation: PACU Anesthesia Type: General Level of consciousness: awake and alert Pain management: pain level controlled Vital Signs Assessment: post-procedure vital signs reviewed and stable Respiratory status: spontaneous breathing, nonlabored ventilation and respiratory function stable Cardiovascular status: blood pressure returned to baseline Postop Assessment: no apparent nausea or vomiting Anesthetic complications: no   No notable events documented.  Last Vitals:  Vitals:   06/13/23 1145 06/13/23 1200  BP: 118/69 127/71  Pulse: 68 68  Resp: 13 12  Temp:    SpO2: 98% 96%    Last Pain:  Vitals:   06/13/23 1215  TempSrc:   PainSc: 5                  Rayfield Cairo

## 2023-06-13 NOTE — Evaluation (Signed)
 Occupational Therapy Evaluation Patient Details Name: Erin Morrison MRN: [id number] DOB: [birth year] Today's Date: 06/13/2023   History of Present Illness   Patient is a 73 year old female who presented on 5/14 for chronic nausea and weight loss. Patient underwent Laparoscopic right hemicolectomy and incisional hernia repair. Patient noted to have hernia and colon cancer during intervention. PMH: COPD 2L/min, HLD.     Clinical Impressions Patient is a 73 year old female who was admitted for above. Patient was living at home independently with husband prior level. Currently, patient is TD for LB dressing/bathing tasks, with min A for transfers with RW. Patient would continue to benefit from skilled OT services at this time while admitted and after d/c to address noted deficits in order to improve overall safety and independence in ADLs. Plan is for patient to d/c home with family support when medically stable. OT to continue to follow.      If plan is discharge home, recommend the following:   A little help with walking and/or transfers;A little help with bathing/dressing/bathroom;Assistance with cooking/housework;Direct supervision/assist for medications management;Assist for transportation;Help with stairs or ramp for entrance;Direct supervision/assist for financial management     Functional Status Assessment   Patient has had a recent decline in their functional status and demonstrates the ability to make significant improvements in function in a reasonable and predictable amount of time.     Equipment Recommendations   Other (comment) (total hip kit, RW)      Precautions/Restrictions   Precautions Recall of Precautions/Restrictions: Intact Precaution/Restrictions Comments: abdominal Restrictions Weight Bearing Restrictions Per Provider Order: No     Mobility Bed Mobility Overal bed mobility: Needs Assistance Bed Mobility: Supine to Sit     Supine to sit: Min  assist     General bed mobility comments: education on log rolling with increased time to complete task.          Balance Overall balance assessment: Mild deficits observed, not formally tested           ADL either performed or assessed with clinical judgement   ADL Overall ADL's : Needs assistance/impaired     Grooming: Sitting;Set up   Upper Body Bathing: Sitting;Minimal assistance   Lower Body Bathing: Sitting/lateral leans;Maximal assistance   Upper Body Dressing : Sitting;Minimal assistance   Lower Body Dressing: Sitting/lateral leans;Maximal assistance   Toilet Transfer: Minimal assistance;Stand-pivot;Rolling walker (2 wheels) Toilet Transfer Details (indicate cue type and reason): to recliner in room. reporting dizziness with movement. patients vitals stable. Toileting- Clothing Manipulation and Hygiene: Sitting/lateral lean;Maximal assistance               Vision   Vision Assessment?: No apparent visual deficits            Pertinent Vitals/Pain Pain Assessment Pain Assessment: Faces Faces Pain Scale: Hurts little more Pain Location: abdomen with movement Pain Descriptors / Indicators: Grimacing, Constant Pain Intervention(s): Limited activity within patient's tolerance, Monitored during session, Premedicated before session     Extremity/Trunk Assessment Upper Extremity Assessment Upper Extremity Assessment: Overall WFL for tasks assessed   Lower Extremity Assessment Lower Extremity Assessment: Defer to PT evaluation   Cervical / Trunk Assessment Cervical / Trunk Assessment: Kyphotic   Communication     Cognition Arousal: Alert Behavior During Therapy: WFL for tasks assessed/performed Cognition: Difficult to assess             OT - Cognition Comments: patient was plesant and cooperative during session. patient had four children and husband  present in room with movement.                 Following commands: Intact                   Home Living Family/patient expects to be discharged to:: Private residence Living Arrangements: Spouse/significant other Available Help at Discharge: Family Type of Home: House Home Access: Stairs to enter Secretary/administrator of Steps: 2 Entrance Stairs-Rails: None Home Layout: One level     Bathroom Shower/Tub: Runner, broadcasting/film/video: None          Prior Functioning/Environment Prior Level of Function : Independent/Modified Independent                    OT Problem List: Decreased strength;Impaired balance (sitting and/or standing);Decreased knowledge of precautions;Decreased knowledge of use of DME or AE;Decreased activity tolerance;Decreased safety awareness   OT Treatment/Interventions: Self-care/ADL training;DME and/or AE instruction;Therapeutic activities;Balance training;Therapeutic exercise;Energy conservation;Patient/family education      OT Goals(Current goals can be found in the care plan section)   Acute Rehab OT Goals Patient Stated Goal: to get better OT Goal Formulation: With patient/family Time For Goal Achievement: 06/27/23 Potential to Achieve Goals: Fair   OT Frequency:  Min 2X/week       AM-PAC OT "6 Clicks" Daily Activity     Outcome Measure Help from another person eating meals?: A Little Help from another person taking care of personal grooming?: A Little Help from another person toileting, which includes using toliet, bedpan, or urinal?: A Lot Help from another person bathing (including washing, rinsing, drying)?: A Lot Help from another person to put on and taking off regular upper body clothing?: A Little Help from another person to put on and taking off regular lower body clothing?: A Lot 6 Click Score: 15   End of Session Equipment Utilized During Treatment: Rolling walker (2 wheels);Oxygen  Nurse Communication: Mobility status  Activity Tolerance: Patient tolerated treatment well;Patient limited  by pain Patient left: in chair;with call bell/phone within reach;with family/visitor present  OT Visit Diagnosis: Unsteadiness on feet (R26.81);Other abnormalities of gait and mobility (R26.89);Pain                Time: 2130-8657 OT Time Calculation (min): 17 min Charges:  OT General Charges $OT Visit: 1 Visit OT Evaluation $OT Eval Low Complexity: 1 Low  Mannix Kroeker OTR/L, MS Acute Rehabilitation Department Office# [phone]   Jame Maze 06/13/2023, 3:57 PM

## 2023-06-13 NOTE — Consult Note (Signed)
 Initial Consultation Note   Patient: Erin Morrison VHQ:[id number] DOB: [birth year] PCP: Aloha Arnold, PA-C DOA: 06/13/2023 DOS: the patient was seen and examined on 06/13/2023 Primary service: Melvenia Stabs, MD  Referring physician: Beatris Lincoln, MD. Reason for consult: Post-op medical management.  Assessment and Plan: Principal Problem:   S/P right hemicolectomy and:   Umbilical hernia hernia repair Continue postop management per primary team.  Active Problems:   Elevated diaphragm   Chronic respiratory failure with hypoxia (HCC)   Moderate COPD (chronic obstructive pulmonary disease) (HCC) Continue supplemental oxygen  as needed. Continue oppressed 3 severe 2 puffs twice daily. Bronchodilators as needed.    Essential hypertension Monitor blood pressure. As needed antihypertensives.    Hyperlipidemia Continue atorvastatin  40 mg p.o. daily.    GERD (gastroesophageal reflux disease) Antiacid, H2 blocker or PPI as needed.    CAD in native artery Denied chest pain. On atorvastatin . Follow-up with cardiology as an outpatient.    Anxiety Continue escitalopram 10 mg p.o. daily.   TRH will continue to follow the patient.  HPI: Erin Morrison is a 73 y.o. female with past medical history of   DVT, allergic rhinitis, anemia, anxiety, atrophic vaginitis, CAD, history of NSTEMI, rectal cancer, tobacco use in remission, history of pneumonia, COPD, chronic respiratory failure with hypoxia, elevated diaphragm, umbilical hernia, essential hypertension, lower back pain, history of UTI who was referred by Dr. Feliberto Hopping to Dr. Camilo Cella due to colon cancer recurrence in the hepatic flexure undergoing laparoscopic right hemicolectomy and incisional hernia repair.  We are being consulted for medical management.  When seen in PACU.  The patient denied any significant pain or nausea.  No dyspnea, chest pain, palpitations, diaphoresis, PND, orthopnea or recent pitting edema of the lower  extremities.  No dyspnea, cough, wheezing or hemoptysis.  No polyuria, polydipsia, polyphagia or blurred vision.  Review of Systems: As mentioned in the history of present illness. All other systems reviewed and are negative. 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   Seen on last chest x-ray 12/2015, not on x-ray from 12/2015     Essential hypertension 06/08/2015   Former smoker 02/24/2022   GERD (gastroesophageal reflux disease) 10/24/2016   H/O steroid therapy 06/08/2015   Hyperlipidemia 12/06/2015   Hypertension    Low back pain    Lower back pain 04/08/2018   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 Denies   On home oxygen  therapy    Pneumonia    2012   Pre-operative respiratory examination 08/31/2020   Umbilical hernia    UTI (urinary tract infection)  04/10/2018   Past Surgical History:  Procedure Laterality Date   arthroscopic knee surgery Right    CARDIAC CATHETERIZATION N/A 03/03/2016   Procedure: Right/Left Heart Cath and Coronary Angiography;  Surgeon: Arty Binning, MD;  Location: [facility] INVASIVE CV LAB;  Service: Cardiovascular;  Laterality: N/A;   COLONOSCOPY N/A  04/20/2023   Procedure: COLONOSCOPY;  Surgeon: Genell Ken, MD;  Location: [facility] ENDOSCOPY;  Service: Gastroenterology;  Laterality: N/A;   ESOPHAGOGASTRODUODENOSCOPY N/A 04/20/2023   Procedure: EGD (ESOPHAGOGASTRODUODENOSCOPY);  Surgeon: Genell Ken, MD;  Location: [facility] ENDOSCOPY;  Service: Gastroenterology;  Laterality: N/A;   HEMOSTASIS CLIP PLACEMENT  04/20/2023   Procedure: CONTROL OF HEMORRHAGE, GI TRACT, ENDOSCOPIC, BY CLIPPING OR OVERSEWING;  Surgeon: Genell Ken, MD;  Location: [facility] ENDOSCOPY;  Service: Gastroenterology;;   POLYPECTOMY  04/20/2023   Procedure: POLYPECTOMY;  Surgeon: Genell Ken, MD;  Location: [facility] ENDOSCOPY;  Service: Gastroenterology;;   TUBAL LIGATION     Social History:  reports that she quit smoking about 13 years ago. Her smoking use included cigarettes. She started smoking about 59 years ago. She has a 69 pack-year smoking history. She has never used smokeless tobacco. She reports that she does not drink alcohol and does not use drugs.  No Known Allergies  Family History  Problem Relation Age of Onset   Emphysema Mother    Cirrhosis Father     Prior to Admission medications   Medication Sig Start Date End Date Taking? Authorizing Provider  acetaminophen  (TYLENOL ) 500 MG tablet Take 1,000 mg by mouth 2 (two) times daily as needed for moderate pain (pain score 4-6) or headache.   Yes [provider]  albuterol  (PROVENTIL ) (2.5 MG/3ML) 0.083% nebulizer solution Take 2.5 mg by nebulization every 6 (six) hours as needed for wheezing or shortness of breath.   Yes [provider]  albuterol  (VENTOLIN  HFA) 108 (90 Base) MCG/ACT inhaler Inhale 2 puffs into the lungs every 6 (six) hours as needed for wheezing or shortness of breath. 01/01/23  Yes Antonio Baumgarten, NP  atorvastatin  (LIPITOR) 40 MG tablet TAKE 1 TABLET BY MOUTH EVERY DAY AT 6PM 02/21/19  Yes Arty Binning, MD  Budeson-Glycopyrrol-Formoterol  (BREZTRI  AEROSPHERE) 160-9-4.8 MCG/ACT AERO Inhale 2  puffs into the lungs in the morning and at bedtime. 01/01/23  Yes Antonio Baumgarten, NP  escitalopram (LEXAPRO) 10 MG tablet Take 10 mg by mouth daily. 05/16/21  Yes [provider]  Ferrous Sulfate (IRON PO) Take 25 mg by mouth daily.   Yes [provider]  montelukast  (SINGULAIR ) 10 MG tablet TAKE 1 TABLET BY MOUTH EVERYDAY AT BEDTIME 10/26/22  Yes Antonio Baumgarten, NP  roflumilast  (DALIRESP ) 500 MCG TABS tablet Take 1 tablet (500 mcg total) by mouth daily. 01/01/23  Yes Antonio Baumgarten, NP    Physical Exam: Vitals:   06/13/23 1100 06/13/23 1110 06/13/23 1115 06/13/23 1120  BP: (!) 112/57  123/62   Pulse: 67 69 63 64  Resp: 12 14 11 10   Temp:      TempSrc:      SpO2: 94% 93% 94% 96%  Weight:      Height:       Physical Exam Vitals and nursing note reviewed.  Constitutional:      General: She is awake. She is not in acute distress.    Appearance: Normal appearance. She is ill-appearing.  HENT:     Head: Normocephalic.     Nose: No rhinorrhea.     Mouth/Throat:  Mouth: Mucous membranes are moist.  Eyes:     Pupils: Pupils are equal, round, and reactive to light.  Neck:     Vascular: No JVD.  Cardiovascular:     Rate and Rhythm: Normal rate and regular rhythm.     Heart sounds: S1 normal and S2 normal.  Pulmonary:     Effort: Pulmonary effort is normal.     Breath sounds: Normal breath sounds. No wheezing, rhonchi or rales.  Abdominal:     General: Bowel sounds are normal. There is no distension.     Palpations: Abdomen is soft.     Tenderness: There is no abdominal tenderness.  Musculoskeletal:     Cervical back: Neck supple.     Right lower leg: No edema.     Left lower leg: No edema.  Skin:    General: Skin is warm and dry.  Neurological:     General: No focal deficit present.     Mental Status: She is alert and oriented to person, place, and time.  Psychiatric:        Mood and Affect: Mood normal.        Behavior: Behavior normal.  Behavior is cooperative.     Data Reviewed:   Results are pending, will review when available.  01/05/2021 Echo report. IMPRESSIONS:   1. Left ventricular ejection fraction, by estimation, is 60 to 65%. The  left ventricle has normal function. The left ventricle has no regional  wall motion abnormalities. Left ventricular diastolic parameters are  consistent with Grade I diastolic  dysfunction (impaired relaxation).   2. Right ventricular systolic function is normal. The right ventricular  size is normal.   3. The mitral valve is grossly normal. No evidence of mitral valve  regurgitation.   4. The aortic valve was not well visualized. Aortic valve regurgitation  is not visualized.   5. The inferior vena cava is normal in size with greater than 50%  respiratory variability, suggesting right atrial pressure of 3 mmHg.   Family Communication:  Primary team communication:  Thank you very much for involving us  in the care of your patient.  Author: Danice Dural, MD 06/13/2023 11:39 AM  For on call review [email or web address].   This document was prepared using Dragon voice recognition software and may contain some unintended transcription errors.

## 2023-06-13 NOTE — Plan of Care (Signed)

## 2023-06-13 NOTE — Op Note (Signed)
 PATIENT: Erin Morrison  73 y.o. female  Patient Care Team: Aloha Arnold, PA-C as PCP - General (Physician Assistant) Gerald Kitty, Isiah Mare, RN as Oncology Nurse Navigator Sonja Yah-ta-hey, MD as Consulting Physician (Hematology and Oncology) Lind Repine, MD as Consulting Physician (Pulmonary Disease)  PREOP DIAGNOSIS: Invasive adenocarcinoma, hepatic flexure  POSTOP DIAGNOSIS: Same  PROCEDURE:  Laparoscopic right hemicolectomy  Incisional hernia repair, reducible - 5 x 5 cm  SURGEON: Renford Cartwright. Uriyah Massimo, MD  ASSISTANT: Joyce Nixon, MD  An experienced assistant was required given the complexity of this procedure and the standard of surgical care. My assistant helped with exposure through counter tension, suctioning, ligation and retraction to better visualize the surgical field. My assistant expedited sewing during the case by following my sutures. Wherever I use the term "we" in the report, my assistant actively helped me with that portion of the procedure.   ANESTHESIA: General endotracheal  EBL: 10 mL Total I/O In: 350 [IV Piggyback:350] Out: 110 [Urine:100; Blood:10]  DRAINS: None  SPECIMEN: Right colon (including distal ileum, cecum/appendix, ascending colon, hepatic flexure, and proximal half of transverse colon  COUNTS: Sponge, needle and instrument counts were reported correct x2  FINDINGS:  Colon cancer: Large mass at the level of the hepatic flexure which does not appear to involve any surrounding structures. No evident metastatic disease on visceral, parietal peritoneum, or liver. Laparoscopic right hemicolectomy carried out uneventfully. Hernia: Reducible incisional hernia 5 x 5 cm.  Incisional hernia in the supraumbilical position is also closed and this was done primarily given the contaminated nature of the index procedure.  NARRATIVE:  The patient was identified & brought into the operating room, placed supine on the operating table and SCDs were applied to  the lower extremities. General endotracheal anesthesia was induced. She was positioned supine with arms tucked. Antibiotics were administered. A foley catheter was placed under sterile conditions. Hair in the region of planned surgery was clipped. The abdomen was prepped and draped in a sterile fashion. A timeout was performed confirming our patient and plan.   Beginning with the extraction port, a supraumbilical incision was made and carried down to the midline fascia. This was then incised with electrocautery. The peritoneum was identified and elevated between clamps and carefully opened sharply.  I then able to identify the hernia which is well inferior to this but were able to ensure that it is reduced.  The hernia sac was then carefully opened.  We incorporated the fascia at this level to our planned extraction as well.  The supraumbilical hernia measures 5 x 5 cm in size.  A small Alexis wound protector with a cap and associated port was then placed. The abdomen was insufflated to 15 mmHg with CO2. A laparoscope was placed and camera inspection revealed no evidence of injury. Bilateral TAP blocks were then performed under laparoscopic visualization using a mixture of 0.25% marcaine with epinepherine + Exparel.  3 additional ports were then placed under direct laparoscopic visualization - two in the left hemiabdomen and one in the right abdomen. The abdomen was surveyed. The liver and peritoneum appeared normal.  There were no signs of metastatic disease.  She was positioned in trendelenburg with left side down. The ileocolic pedicle was identified. Gentle blunt dissection commenced around the pedicle and the duodenum was identified and freed from the surrounding structures. Ileocolic pedicle is circumferentially cleared. We then also freed the appendix off its attachments to the pelvic wall. I mobilized the terminal ileum, taking care to  avoid injuring any retroperitoneal structures.  After this we began  to mobilize laterally down the Liza Czerwinski line of Toldt and then took down the hepatic flexure using the Enseal device. We mobilized the omentum off of the right transverse colon. The entire colon was then flipped medially and mobilized off of the retroperitoneal structures until I could visualize the lateral edge of the duodenum underneath. The ileocolic pedicle is ultimately ligated divided using the laparoscopic Enseal device.  The pedicles inspected noted to be hemostatic.  At this point, the abdomen was desufflated and the terminal ileum and right colon delivered through the wound protector. The distal ileum was then transected using a GIA blue load stapler. The remaining mesentery was divided using the Enseal device. The divided mesentery was inspected and noted to be hemostatic. The distal point of transection was then identified on the transverse colon at a location that included the right branch of the middle colics, leaving the main middle colic feeding the remaining transverse colon. This was transected using another blue load GIA stapler.  Margins were inspected and grossly negative, at least 5 cm on either side.  The specimen was then passed off. Attention was turned to creating the anastomosis. The terminal ileum and transverse colon were inspected for orientation to ensure no twisting nor bowel included in the mesenteric defect. An anastomosis was created between the terminal ileum and the transverse colon using a 75 mm GIA blue load stapler. The staple line was inspected and noted to be hemostatic.  The common enterotomy channel was closed using a TA 60 blue load stapler. Hemostasis was achieved at the staple line using 3-0 silk U-stitches. 3-0 silk sutures were used to imbricate the corners of the staple line as well.  A 2-0 silk suture was placed securing the "crotch" of the anastomosis. The anastomosis was palpated and noted to be widely patent. This was then placed back into the abdomen. The abdomen  was then irrigated with sterile saline and hemostasis verified. The omentum was then brought down over the anastomosis. The wound protector cap was replaced and Co2 reinsufflated. The laparoscopic ports were removed under direct visualization and the sites noted to be hemostatic. The Alexis wound protector was removed, counts were reported correct, and we switched to clean instruments, gowns and drapes.  During this changeover, we did go to the back table and inspected the specimen.  We did open the specimen distally and again have widely negative margins.  There is a large fungating mass at the level of the hepatic flexure.  The incisional hernias were inspected.  We are able to free the subcutaneous tissue from the underlying fascia.  Given the semicontaminated nature of this procedure, we did opt to proceed with a primary closure.  Additionally, with her other comorbidities, I felt that any sort of mesh based repair would increase the time of the procedure which with her baseline oxygen  requirements is not trivial.  We opted for a primary closure of the incisional/ventral hernia.  The hernia was closed using #1 PDS suture.  Were also then able to close the extraction site with a running #1 PDS suture..  The skin of all incision sites was closed with 4-0 monocryl subcuticular suture. Dermabond was placed on the port sites and a sterile dressing was placed over the abdominal incision. All counts were reported correct. She was then awakened from anesthesia, extubated, and sent to the post anesthesia care unit in stable condition.   DISPOSITION: PACU in satisfactory condition

## 2023-06-13 NOTE — H&P (Signed)
 CC: Here today for surgery  HPI: Erin Morrison is an 73 y.o. female with history of COPD (on 2L [place] O2), HLD, whom is seen in the office today as a referral by Dr. Feliberto Hopping for evaluation of colon cancer, hepatic flexure.   She underwent a CT scan with her primary-chest abdomen pelvis without contrast-as noted below, for chronic nausea and weight loss.  CT CAP without contrast 03/23/23 (read by radiology 04/12/23) IMPRESSION: 1. Marked right-sided colonic wall thickening with relatively mild surrounding edema. Findings suspicious for colon carcinoma. Relatively focal infectious or ischemic colitis felt less likely. 2. Borderline ileocolic mesenteric adenopathy, suspicious for regional nodal metastasis. No evidence of distant metastasis. 3. Infrarenal abdominal aortic aneurysm with mild extension into the right common iliac. Recommend follow-up ultrasound every 3 years. (Ref.: J Vasc Surg. 2018; 67:2-77 and J Am Coll Radiol 2013;10(10):789-794.) 4. 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 Differential considerations include liposarcoma, teratoma, or extramedullary hematopoiesis. This can be re-evaluated on follow-up CT at 6 months. 5. Incidental findings, including: Aortic atherosclerosis (ICD10-I70.0), coronary artery atherosclerosis and emphysema (ICD10-J43.9). Fat containing ventral abdominopelvic and right inguinal hernias  Colonoscopy with Dr. Feliberto Hopping 04/20/23 - Preparation of the colon was fair. - Diverticulosis in the sigmoid colon. - Malignant partially obstructing tumor at the hepatic flexure. Biopsied. Tattooed. - Three 6 to 9 mm polyps in the transverse colon, removed with a cold snare. Resected and retrieved. - One greater than 50 mm polyp in the rectum (15 cm from anus), removed piecemeal using a hot snare. Resected and retrieved. Clip (MR conditional) was placed. Tattooed. - One 20 mm polyp in the rectum(10 cm from anus), removed  piecemeal using a hot snare. Resected and retrieved. Clip (MR conditional) was placed. - Non-bleeding internal hemorrhoids.  PATH A. DUODENUM, BIOPSY: Duodenal mucosa with foveolar metaplasia and Brunner gland hyperplasia consistent with chronic peptic duodenitis. Negative for dysplasia.  B. STOMACH, ANTRUM, BIOPSY: Gastric antral / oxyntic mucosa with focal chronic inactive gastritis. No H. pylori identified on HE stain. Negative for intestinal metaplasia or dysplasia.  C. HEPATICD FLEXURE, BIOPSY: Invasive adenocarcinoma, moderately differentiated.  D. COLON, TRANSVERSE, POLYPECTOMY: Tubular adenoma. Negative for high-grade dysplasia.  E. RECTUM, @ 15 CM, POLYPECTOMY: Invasive adenocarcinoma, moderately differentiated, arising in tubular adenoma with high-grade dysplasia.  F. RECTUM, @ 10 CM, POLYPECTOMY: Tubular adenoma. Negative for high-grade dysplasia or invasive carcinoma.   Mismatch Repair Protein (IHC)  Specimen: Adenocarcinoma, hepatic flexure (C)  SUMMARY INTERPRETATION: ABNORMAL  There is loss of the major and minor MMR proteins MLH1 and PMS2. The loss of expression may be secondary to promoter hyper-methylation, gene mutation or other genetic event. BRAF mutation testing and/or MLH1 methylation testing is indicated. The presence of a BRAF mutation and/or MLH1 hypermethylation is indicative of a sporadic-type tumor. The absence of either BRAF mutation and/or presence of normal methylation indicate the possible presence of a hereditary germline mutation (e.g. Lynch syndrome) and referral to genetic counseling is warranted. It is recommended that the loss of protein expression be correlated with molecular based MSI testing.  IHC EXPRESSION RESULTS TEST RESULT MLH1: LOSS OF NUCLEAR EXPRESSION MSH2: Preserved nuclear expression MSH6: Preserved nuclear expression PMS2: LOSS OF NUCLEAR EXPRESSION   Mismatch Repair Protein (IHC)  Specimen: Adenocarcinoma,  rectum (E)  SUMMARY INTERPRETATION: NORMAL There is preserved expression of the major MMR proteins. There is a very low probability that microsatellite instability (MSI) is present. However, certain clinically significant MMR protein mutations may result  in preservation of nuclear expression. It is recommended that the preservation of protein expression be correlated with molecular based MSI testing.  IHC EXPRESSION RESULTS TEST RESULT MLH1: Preserved nuclear expression MSH2: Preserved nuclear expression MSH6: Preserved nuclear expression PMS2: Preserved nuclear expression   Subsequently referred to see us  to discuss potential surgery.  History of Present Illness  She initially underwent a CT scan at the end of February due to unintentional weight loss, which revealed abnormalities leading to a colonoscopy. The colonoscopy identified a cancerous lesion in the right side of the colon at the hepatic flexure and a cancerous polyp in the rectum, which was removed during the procedure. No blood in stool and this was her first colonoscopy. She experiences constipation and cramping, which have been ongoing issues for many months now  She has a history of chronic obstructive pulmonary disease (COPD) attributed to a long history of smoking, having smoked a pack a day from age 34 until quitting in 2012. Her COPD significantly limits her physical activity, as she is mostly sedentary and experiences breathing difficulties. She is under the care of Dr. Celene Coins for her pulmonary issues.  No history of heart attack or stroke but reports dizziness. She has a history of cardiac evaluation by Dr. Felipe Horton approximately three years ago, which included a cardiac catheterization revealing minor blockage that was not concerning at the time.  She has not had any major abdominal surgeries except for a tubal ligation. She also has a history of a umbilical hernia, which was previously evaluated by Dr. Melton Squires, but  surgery was deferred due to concerns about her respiratory status.  She has met with both her pulmonologist and cardiologist to have cleared her for surgery albeit at moderate risk. She clearly understands all this and understands the procedure has significant risk to her from a comorbidity standpoint. She has also met with medical oncology. Despite the mutation, after discussion with her and her husband, all parties feel it is in her best interest to undergo an attempt at resection if at all possible.  Remains highly motivated to proceed with surgery.  She denies any changes in health or health history since we met in the office. No new medications/allergies. She states she is ready for surgery today.   PMH: COPD, HLD  PSH: Tubal ligation - periumbilical  FHx: Denies any known family history of colorectal, breast, endometrial or ovarian cancer  Social Hx: Reformed smoker - ~50 pack yr hx; denies use of EtOH/illicit drug. 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   Seen on last chest x-ray 12/2015, not on x-ray from 12/2015     Essential hypertension 06/08/2015   Former smoker 02/24/2022   GERD (gastroesophageal reflux disease) 10/24/2016   H/O steroid therapy 06/08/2015   Hyperlipidemia 12/06/2015   Hypertension    Low back pain    Lower back pain 04/08/2018   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) 04/25/2023   Moderate COPD (chronic obstructive  pulmonary disease) (HCC) 06/08/2015      03/23/16: FVC 2.28 L  (80%),FEV1 1.49 L (69%), ratio 66 sig bronchodilator response NO DLCO  07/13/15: FVC 2.34 L (82%) FEV1 1.46 L (67%) ratio 63 sig bronchodilator response DLCO uncorrected 62%           NSTEMI (non-ST elevated myocardial infarction) (HCC) 12/06/2015   Setup cath       Pt. Denies   On home oxygen  therapy    Pneumonia    2012   Pre-operative respiratory examination 08/31/2020   Umbilical hernia    UTI (urinary tract infection) 04/10/2018    Past Surgical History:  Procedure Laterality Date   arthroscopic knee surgery Right    CARDIAC CATHETERIZATION N/A 03/03/2016   Procedure: Right/Left Heart Cath and Coronary Angiography;  Surgeon: Arty Binning, MD;  [facility] INVASIVE CV LAB;  Service: Cardiovascular;  Laterality: N/A;   COLONOSCOPY N/A 04/20/2023   Procedure: COLONOSCOPY;  Surgeon: Genell Ken, MD;  Location: [facility] ENDOSCOPY;  Service: Gastroenterology;  Laterality: N/A;   ESOPHAGOGASTRODUODENOSCOPY N/A 04/20/2023   Procedure: EGD (ESOPHAGOGASTRODUODENOSCOPY);  Surgeon: Genell Ken, MD;  Location: [facility] ENDOSCOPY;  Service: Gastroenterology;  Laterality: N/A;   HEMOSTASIS CLIP PLACEMENT  04/20/2023   Procedure: CONTROL OF HEMORRHAGE, GI TRACT, ENDOSCOPIC, BY CLIPPING OR OVERSEWING;  Surgeon: Genell Ken, MD;  Location: [facility] ENDOSCOPY;  Service: Gastroenterology;;   POLYPECTOMY  04/20/2023   Procedure: POLYPECTOMY;  Surgeon: Genell Ken, MD;  Location: [facility] ENDOSCOPY;  Service: Gastroenterology;;   TUBAL LIGATION      Family History  Problem Relation Age of Onset   Emphysema Mother    Cirrhosis Father     Social:  reports that she quit smoking about 13 years ago. Her smoking use included cigarettes. She started smoking about 59 years ago. She has a 69 pack-year smoking history. She has never used smokeless tobacco. She reports that she does not drink alcohol and does not use drugs.  Allergies: No Known Allergies  Medications: I have reviewed the patient's current medications.  No results  found for this or any previous visit (from the past 48 hours).  No results found.   PE Blood pressure (!) 148/73, pulse 99, temperature 97.8 F (36.6 C), temperature source Oral, resp. rate 16, height 5\' 2"  (1.575 m), weight 62 kg, SpO2 95%. Constitutional: NAD; conversant Eyes: Moist conjunctiva; no lid lag; anicteric Lungs: Normal respiratory effort CV: RRR GI: Abd soft, NT/ND; + supraumbilical ventral hernia ~ 5 cm in greatest dimension; no palpable hepatosplenomegaly Psychiatric: Appropriate affect  No results found for this or any previous visit (from the past 48 hours).  No results found.  A/P: Gracianna K Hildebrandt is an 73 y.o. female with hx of COPD, CAD, HLD here for surgery regarding of hepatic flexure adenocarcinoma  - CT chest/abdomen/pelvis with IV contrast for staging purposes as her previous scan was not with contrast.  - Pulmonary clearance-Dr. Villa Greaser; cardiology clearance-[facility]/Dr. Felipe Horton - obtained - elevated risk but no factors found to be modifiable. She understands all of this well.  - We spent time reviewing everything with her. She does have a piecemeal removed malignant polyp of the rectum at approximately 15 cm by Dr. Odelia Bender estimation. We discussed that standard of care for that lesion is typically only segmental resection of this portion of the colon but given her complicating factors including her significant COPD, we discussed a more tailored approach to her care, favoring observation which both myself, medical/oncology and she are all on board  with  -The anatomy and physiology of the GI tract was reviewed with the patient. The pathophysiology of colon cancer was discussed as well with associated pictures. -We have discussed various different treatment options going forward including surgery (the most definitive) to address this -laparoscopic right hemicolectomy; primary repair of ventral hernia  -The planned procedure, material risks (including, but not limited  to, pain, bleeding, infection, scarring, need for blood transfusion, damage to surrounding structures- blood vessels/nerves/viscus/organs, damage to ureter, urine leak, leak from anastomosis, need for additional procedures, scenarios where a stoma may be necessary and where it may be permanent, worsening of pre-existing medical conditions, chronic diarrhea, constipation secondary to narcotic use, hernia/recurrent hernia, recurrence, pneumonia, heart attack, stroke, death) benefits and alternatives to surgery were discussed at length. The patient's questions were answered to her and her husband's satisfaction, they voiced understanding and elected to proceed with surgery. Additionally, we discussed typical postoperative expectations and the recovery process.  - We discussed that in her particular case there are additional concerns including her respiratory status. We also spent time discussing that even if she is cleared with pulmonary, there is a increased risk for her needing to have prolonged endotracheal tube and even situations where things like tracheostomy could be necessary.  - With regards to the piecemeal removed malignant polyp in the rectum, we discussed the importance of close surveillance moving forward. We did discuss an increased risk for recurrence that location and rationale for surgery being to reduce that particular chance. That said, doing both a right hemicolectomy as well as an LAR would increase her risk for both pulmonary and colorectal type complications. She was in agreement with this and would like to proceed with a planned right hemicolectomy alone as opposed to double colectomy. This makes sense to me as well.   Beatris Lincoln, MD [facility], [facility]

## 2023-06-14 ENCOUNTER — Encounter (HOSPITAL_COMMUNITY): Payer: Self-pay | Admitting: Surgery

## 2023-06-14 DIAGNOSIS — Z9049 Acquired absence of other specified parts of digestive tract: Secondary | ICD-10-CM | POA: Diagnosis not present

## 2023-06-14 LAB — HEPATIC FUNCTION PANEL
ALT: 14 U/L (ref 0–44)
AST: 14 U/L — ABNORMAL LOW (ref 15–41)
Albumin: 3.3 g/dL — ABNORMAL LOW (ref 3.5–5.0)
Alkaline Phosphatase: 69 U/L (ref 38–126)
Bilirubin, Direct: <0.1 mg/dL (ref 0.0–0.2)
Total Bilirubin: 0.6 mg/dL (ref 0.0–1.2)
Total Protein: 6.2 g/dL — ABNORMAL LOW (ref 6.5–8.1)

## 2023-06-14 LAB — CBC
HCT: 27 % — ABNORMAL LOW (ref 36.0–46.0)
Hemoglobin: 7.8 g/dL — ABNORMAL LOW (ref 12.0–15.0)
MCH: 20.8 pg — ABNORMAL LOW (ref 26.0–34.0)
MCHC: 28.9 g/dL — ABNORMAL LOW (ref 30.0–36.0)
MCV: 72 fL — ABNORMAL LOW (ref 80.0–100.0)
Platelets: 438 10*3/uL — ABNORMAL HIGH (ref 150–400)
RBC: 3.75 MIL/uL — ABNORMAL LOW (ref 3.87–5.11)
RDW: 17.1 % — ABNORMAL HIGH (ref 11.5–15.5)
WBC: 11.2 10*3/uL — ABNORMAL HIGH (ref 4.0–10.5)
nRBC: 0 % (ref 0.0–0.2)

## 2023-06-14 LAB — BASIC METABOLIC PANEL WITH GFR
Anion gap: 9 (ref 5–15)
BUN: 9 mg/dL (ref 8–23)
CO2: 25 mmol/L (ref 22–32)
Calcium: 8.9 mg/dL (ref 8.9–10.3)
Chloride: 100 mmol/L (ref 98–111)
Creatinine, Ser: <0.3 mg/dL — ABNORMAL LOW (ref 0.44–1.00)
Glucose, Bld: 101 mg/dL — ABNORMAL HIGH (ref 70–99)
Potassium: 3.8 mmol/L (ref 3.5–5.1)
Sodium: 134 mmol/L — ABNORMAL LOW (ref 135–145)

## 2023-06-14 LAB — PHOSPHORUS: Phosphorus: 3.9 mg/dL (ref 2.5–4.6)

## 2023-06-14 LAB — MAGNESIUM: Magnesium: 2 mg/dL (ref 1.7–2.4)

## 2023-06-14 NOTE — Plan of Care (Signed)
  Problem: Education: Goal: Knowledge of General Education information will improve Description: Including pain rating scale, medication(s)/side effects and non-pharmacologic comfort measures Outcome: Progressing   Problem: Clinical Measurements: Goal: Will remain free from infection Outcome: Progressing Goal: Diagnostic test results will improve Outcome: Progressing   Problem: Education: Goal: Knowledge of General Education information will improve Description: Including pain rating scale, medication(s)/side effects and non-pharmacologic comfort measures Outcome: Progressing   Problem: Clinical Measurements: Goal: Will remain free from infection Outcome: Progressing Goal: Diagnostic test results will improve Outcome: Progressing

## 2023-06-14 NOTE — Consult Note (Signed)
 Followup Consultation Note   Patient: Erin Morrison ZOX:[id number] DOB: 17-Apr-1950 PCP: Aloha Arnold, PA-C DOA: 06/13/2023 DOS: the patient was seen and examined on 06/14/2023 Primary service: Melvenia Stabs, MD  Referring physician: Dr. Beatris Lincoln Reason for consult: Postop management of medical issues including COPD  Subjective:  Patient states she is doing very well, notes that she is eating and drinking well without nausea or vomiting.  No increased abdominal pain.  Notes she has had 2 bowel movements already today.  Has no acute concerns.  Assessment and Plan:  Postop anemia likely secondary to ABLA Hemoglobin is decreased from 8.9 to 7.8 Patient remains hemodynamically stable with good blood pressure and no tachycardia With start iron when appropriate as an outpatient  Moderate COPD Chronic respiratory failure with hypoxia Patient is stable and at baseline in regards to her COPD Continue Breztri , montelukast  and Daliresp  Oxygen  as needed  Hyperlipidemia CAD Patient denies any chest pain or shortness of breath different from baseline No evidence of acute coronary issues Continue atorvastatin , antiplatelets per cardiologist as an outpatient  Anxiety and depression Continue escitalopram per home doses Patient is coping well with surgery  GERD HTN Well-controlled off medications   TR[facility] will continue to follow the patient.  HPI: Erin Morrison is a 73 y.o. female with past medical history of DVT, allergic rhinitis, anemia, anxiety, atrophic vaginitis, CAD, history of NSTEMI, rectal cancer, tobacco use in remission, history of pneumonia, COPD, chronic respiratory failure with hypoxia, elevated diaphragm, umbilical hernia, essential hypertension, lower back pain, history of UTI who was referred by Dr. Feliberto Hopping to Dr. Camilo Cella due to colon cancer recurrence in the hepatic flexure undergoing laparoscopic right hemicolectomy and incisional hernia repair.    Past  Medical History:  Diagnosis Date   Acute DVT (deep venous thrombosis) (HCC)    Allergic rhinitis    Anemia    Anxiety    Atrophic vaginitis    CAD in native artery 04/17/2017   Cancer of rectum (HCC) 04/25/2023   Chest pain 01/13/2016   Chronic allergic rhinitis 01/13/2016   Chronic respiratory failure with hypoxia (HCC) 05/03/2017   COPD (chronic obstructive pulmonary disease) (HCC)    COPD exacerbation (HCC) 12/03/2015   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   Seen on last chest x-ray 12/2015, not on x-ray from 12/2015     Essential hypertension 06/08/2015   Former smoker 02/24/2022   GERD (gastroesophageal reflux disease) 10/24/2016   H/O steroid therapy 06/08/2015   Hyperlipidemia 12/06/2015   Hypertension    Low back pain    Lower back pain 04/08/2018   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 Denies   On home oxygen  therapy    Pneumonia    2012   Pre-operative respiratory examination 08/31/2020   Umbilical hernia    UTI (urinary tract infection) 04/10/2018   Past Surgical History:  Procedure Laterality Date   arthroscopic knee surgery Right    CARDIAC CATHETERIZATION N/A 03/03/2016   Procedure: Right/Left Heart Cath and Coronary Angiography;  Surgeon: Arty Binning, MD;  Location: [facility] IN[facility]ASIVE [facility];  Service: Cardiovascular;  Laterality: N/A;   COLON RESECTION Right 06/13/2023   Procedure: COLECTOMY, HAND-ASSISTED, LAPAROSCOPIC;  Surgeon: Melvenia Stabs, MD;  Location: [facility] ORS;  Service: General;  Laterality: Right;  LAPAROSCOPIC RIGHT HEMICOLECTOMY   COLONOSCOPY N/A 04/20/2023    Procedure: COLONOSCOPY;  Surgeon: Genell Ken, MD;  Location: [facility] ENDOSCOPY;  Service: Gastroenterology;  Laterality: N/A;   ESOPHAGOGASTRODUODENOSCOPY N/A 04/20/2023   Procedure: EGD (ESOPHAGOGASTRODUODENOSCOPY);  Surgeon: Genell Ken, MD;  Location: [facility] ENDOSCOPY;  Service: Gastroenterology;  Laterality: N/A;   HEMOSTASIS CLIP PLACEMENT  04/20/2023   Procedure: CONTROL OF HEMORRHAGE, GI TRACT, ENDOSCOPIC, BY CLIPPING OR OVERSEWING;  Surgeon: Genell Ken, MD;  Location: [facility] ENDOSCOPY;  Service: Gastroenterology;;   POLYPECTOMY  04/20/2023   Procedure: POLYPECTOMY;  Surgeon: Genell Ken, MD;  Location: [facility] ENDOSCOPY;  Service: Gastroenterology;;   TUBAL LIGATION     VENTRAL HERNIA REPAIR N/A 06/13/2023   Procedure: REPAIR, HERNIA, VENTRAL;  Surgeon: Melvenia Stabs, MD;  Location: [facility] ORS;  Service: General;  Laterality: N/A;  VENTRAL HERNIA REPAIR   Social History:  reports that she quit smoking about 13 years ago. Her smoking use included cigarettes. She started smoking about 59 years ago. She has a 69 pack-year smoking history. She has never used smokeless tobacco. She reports that she does not drink alcohol and does not use drugs.  No Known Allergies  Family History  Problem Relation Age of Onset   Emphysema Mother    Cirrhosis Father     Physical Exam: Vitals:   06/14/23 0500 06/14/23 0523 06/14/23 0808 06/14/23 1334  BP:  120/66  110/63  Pulse:  66  75  Resp:  16    Temp:  (!) 97.5 F (36.4 C)  97.6 F (36.4 C)  TempSrc:  Oral  Oral  SpO2:  96% 97% 97%  Weight: 66.5 kg     Height:       Patient was sitting up in recliner in excellent spirits chatting with her family members Lungs were clear, cardiac rhythm was regular, abdominal exam with mild TTP as would be expected.  Family Communication: Family was at bedside throughout     Author: Magdalene School, MD 06/14/2023 3:05 PM  For on call review [email or web address].

## 2023-06-14 NOTE — Plan of Care (Signed)
  Problem: Education: Goal: Understanding of discharge needs will improve Outcome: Progressing Goal: Verbalization of understanding of the causes of altered bowel function will improve Outcome: Progressing   Problem: Activity: Goal: Ability to tolerate increased activity will improve Outcome: Progressing

## 2023-06-14 NOTE — TOC Initial Note (Signed)
 Transition of Care [facility]) - Initial/Assessment Note    Patient Details  Name: Erin Morrison MRN: [id number] Date of Birth: July 15, 1950  [facility]) CM/SW Contact:    Bari Leys, RN Phone Number: 06/14/2023, 10:41 AM  Clinical Narrative:  Met with patient at bedside to introduce role of TOC/NCM and review for dc planning, pt confirmed she has an established PCP and pharmacy, no current home care services, has home 02 with portable 02 at bedside for dc home, pt reports she feels safe returning home with spouse. PT eval, await recommendation. TOC will continue to follow.                  Expected Discharge Plan: Home/Self Care     Patient Goals and CMS Choice Patient states their goals for this hospitalization and ongoing recovery are:: return home          Expected Discharge Plan and Services       Living arrangements for the past 2 months: Single Family Home                                      Prior Living Arrangements/Services Living arrangements for the past 2 months: Single Family Home Lives with:: Spouse Patient language and need for interpreter reviewed:: Yes Do you feel safe going back to the place where you live?: Yes      Need for Family Participation in Patient Care: Yes (Comment) Care giver support system in place?: Yes (comment) Current home services: DME (Home 02) Criminal Activity/Legal Involvement Pertinent to Current Situation/Hospitalization: No - Comment as needed  Activities of Daily Living   ADL Screening (condition at time of admission) Independently performs ADLs?: Yes (appropriate for developmental age) Is the patient deaf or have difficulty hearing?: No Does the patient have difficulty seeing, even when wearing glasses/contacts?: No Does the patient have difficulty concentrating, remembering, or making decisions?: No  Permission Sought/Granted                  Emotional Assessment Appearance:: Appears stated  age Attitude/Demeanor/Rapport: Engaged Affect (typically observed): Accepting Orientation: : Oriented to Self, Oriented to Place, Oriented to  Time, Oriented to Situation Alcohol / Substance Use: Not Applicable Psych Involvement: No (comment)  Admission diagnosis:  Malignant neoplasm of colon, unspecified part of colon (HCC) [C18.9] Ventral hernia without obstruction or gangrene [K43.9] S/P right hemicolectomy [Z90.49] Patient Active Problem List   Diagnosis Date Noted   S/P right hemicolectomy 06/13/2023   Anxiety 06/13/2023   Acute DVT (deep venous thrombosis) (HCC)    Atrophic vaginitis    Umbilical hernia    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) 04/25/2023   Cancer of rectum (HCC) 04/25/2023   Former smoker 02/24/2022   Preop cardiovascular exam 08/31/2020   UTI (urinary tract infection) 04/10/2018   Lower back pain 04/08/2018   Chronic respiratory failure with hypoxia (HCC) 05/03/2017   CAD in native artery 04/17/2017   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   GERD (gastroesophageal reflux disease) 10/24/2016   Chronic allergic rhinitis 01/13/2016   Chest pain 01/13/2016   NSTEMI (non-ST elevated myocardial infarction) (HCC) 12/06/2015   Hyperlipidemia 12/06/2015   COPD exacerbation (HCC) 12/03/2015   Moderate COPD (chronic obstructive pulmonary disease) (HCC) 06/08/2015   H/O steroid therapy 06/08/2015   Essential hypertension 06/08/2015   PCP:  Aloha Arnold, PA-C Pharmacy:   [facility] [phone] -  [place], [place] - [address] [facility] [address] [place] [address] Phone: [phone] Fax: [phone]  [facility] [place], [place] - [address] [place] [address] Phone: [phone] Fax: [phone]     Social Drivers of Health (SDOH) Social History: SDOH Screenings   Food Insecurity: No Food Insecurity (06/13/2023)  Housing: Low Risk  (06/13/2023)  Transportation Needs: No Transportation  Needs (06/13/2023)  Utilities: Not At Risk (06/13/2023)  Social Connections: Socially Isolated (06/13/2023)  Tobacco Use: Medium Risk (06/13/2023)   SDOH Interventions:     Readmission Risk Interventions    06/14/2023   10:40 AM  Readmission Risk Prevention Plan  Post Dischage Appt Complete  Medication Screening Complete  Transportation Screening Complete

## 2023-06-14 NOTE — Evaluation (Signed)
 Physical Therapy Evaluation Patient Details Name: Erin Morrison MRN: [id number] DOB: [birth year] Today's Date: 06/14/2023  History of Present Illness  Patient is a 73 year old female admitted with colon carcinoma, s/p Laparoscopic right hemicolectomy and incisional hernia repair 06/13/23.  PMH: COPD 2L/min, HLD.  Clinical Impression  Pt is mobilizing well, she ambulated 250' with RW, no loss of balance, with 3L O2. She has 24* assist at home, and has a RW. At baseline she ambulates independently without an assistive device. Reviewed log roll technique for bed mobility.  She is mobilizing well at a modified independent level, PT signing off. Encouraged pt to ambulate in hall at least TID with family assistance for O2 tank.       If plan is discharge home, recommend the following: Assistance with cooking/housework;Assist for transportation   Can travel by private vehicle        Equipment Recommendations None recommended by PT  Recommendations for Other Services       Functional Status Assessment Patient has not had a recent decline in their functional status     Precautions / Restrictions Precautions Precautions: Other (comment) Recall of Precautions/Restrictions: Intact Precaution/Restrictions Comments: abdominal, reviewed log roll technique Restrictions Weight Bearing Restrictions Per Provider Order: No      Mobility  Bed Mobility               General bed mobility comments: up in recliner, verbally reviewed log roll technique    Transfers Overall transfer level: Modified independent Equipment used: Rolling walker (2 wheels) Transfers: Sit to/from Stand Sit to Stand: Modified independent (Device/Increase time)           General transfer comment: used armrests to push up    Ambulation/Gait Ambulation/Gait assistance: Modified independent (Device/Increase time) Gait Distance (Feet): 250 Feet Assistive device: Rolling walker (2 wheels) Gait  Pattern/deviations: WFL(Within Functional Limits) Gait velocity: WFL     General Gait Details: steady, no loss of balance, ambulated with 3L O2, no dyspnea  Stairs            Wheelchair Mobility     Tilt Bed    Modified Rankin (Stroke Patients Only)       Balance Overall balance assessment: Modified Independent                                           Pertinent Vitals/Pain Pain Assessment Pain Score: 6  Pain Location: abdomen with movement Pain Descriptors / Indicators: Grimacing Pain Intervention(s): Limited activity within patient's tolerance, Monitored during session, Premedicated before session, Repositioned    Home Living Family/patient expects to be discharged to:: Private residence Living Arrangements: Spouse/significant other Available Help at Discharge: Family Type of Home: House Home Access: Stairs to enter Entrance Stairs-Rails: None Secretary/administrator of Steps: 2   Home Layout: One level Home Equipment: Agricultural consultant (2 wheels)      Prior Function Prior Level of Function : Independent/Modified Independent             Mobility Comments: no falls in past 6 months, walks without AD, on 3L O2 baseline ADLs Comments: independent; doesn't drive     Extremity/Trunk Assessment   Upper Extremity Assessment Upper Extremity Assessment: Defer to OT evaluation    Lower Extremity Assessment Lower Extremity Assessment: Overall WFL for tasks assessed    Cervical / Trunk Assessment Cervical / Trunk Assessment: Kyphotic  Communication  Cognition                                         Cueing       General Comments      Exercises     Assessment/Plan    PT Assessment Patient does not need any further PT services  PT Problem List         PT Treatment Interventions      PT Goals (Current goals can be found in the Care Plan section)  Acute Rehab PT Goals Patient Stated Goal: likes to  clean her house and visit 13 grandchildren PT Goal Formulation: All assessment and education complete, DC therapy    Frequency       Co-evaluation               AM-PAC PT "6 Clicks" Mobility  Outcome Measure Help needed turning from your back to your side while in a flat bed without using bedrails?: None Help needed moving from lying on your back to sitting on the side of a flat bed without using bedrails?: None Help needed moving to and from a bed to a chair (including a wheelchair)?: None Help needed standing up from a chair using your arms (e.g., wheelchair or bedside chair)?: None Help needed to walk in hospital room?: None Help needed climbing 3-5 steps with a railing? : None 6 Click Score: 24    End of Session Equipment Utilized During Treatment: Gait belt;Oxygen  Activity Tolerance: Patient tolerated treatment well Patient left: in chair;with call bell/phone within reach;with family/visitor present Nurse Communication: Mobility status      Time: 1042-1100 PT Time Calculation (min) (ACUTE ONLY): 18 min   Charges:   PT Evaluation $PT Eval Low Complexity: 1 Low             Lynann Sandman Kistler PT 06/14/2023  Acute Rehabilitation Services  Office [phone]

## 2023-06-14 NOTE — Progress Notes (Signed)
 Subjective No acute events. Feeling quite well. Minimal if any abdominal pain. No n/v. Tolerating full liquids without trouble. No flatus/BM yet.  Objective: Vital signs in last 24 hours: Temp:  [97.5 F (36.4 C)-98.7 F (37.1 C)] 97.5 F (36.4 C) (05/15 0523) Pulse Rate:  [45-78] 66 (05/15 0523) Resp:  [10-16] 16 (05/15 0523) BP: (112-146)/(57-81) 120/66 (05/15 0523) SpO2:  [90 %-99 %] 97 % (05/15 0808) Weight:  [66.5 kg] 66.5 kg (05/15 0500) Last BM Date : 06/12/23  Intake/Output from previous day: 05/14 0701 - 05/15 0700 In: 1549.9 [P.O.:280; I.V.:919.9; IV Piggyback:350] Out: 1210 [Urine:1200; Blood:10] Intake/Output this shift: No intake/output data recorded.  Gen: NAD, comfortable, up in chair eating breakfast CV: RRR Pulm: Normal work of breathing Abd: Soft, NT/ND. Incisions c/d/I with dermabond. No palpable hernias. No drainage Ext: SCDs in place  Lab Results: CBC  Recent Labs    06/14/23 0449  WBC 11.2*  HGB 7.8*  HCT 27.0*  PLT 438*   BMET Recent Labs    06/14/23 0449  NA 134*  K 3.8  CL 100  CO2 25  GLUCOSE 101*  BUN 9  CREATININE <0.30*  CALCIUM  8.9   PT/INR No results for input(s): "LABPROT", "INR" in the last 72 hours. ABG No results for input(s): "PHART", "HCO3" in the last 72 hours.  Invalid input(s): "PCO2", "PO2"  Studies/Results:  Anti-infectives: Anti-infectives (From admission, onward)    Start     Dose/Rate Route Frequency Ordered Stop   06/13/23 1400  neomycin (MYCIFRADIN) tablet 1,000 mg  Status:  Discontinued       Placed in "And" Linked Group   1,000 mg Oral 3 times per day 06/13/23 0626 06/13/23 0627   06/13/23 1400  metroNIDAZOLE (FLAGYL) tablet 1,000 mg  Status:  Discontinued       Placed in "And" Linked Group   1,000 mg Oral 3 times per day 06/13/23 0626 06/13/23 0627   06/13/23 0630  cefoTEtan (CEFOTAN) 2 g in sodium chloride  0.9 % 100 mL IVPB        2 g 200 mL/hr over 30 Minutes Intravenous On call to O.R.  06/13/23 0626 06/13/23 0840        Assessment/Plan: Patient Active Problem List   Diagnosis Date Noted   S/P right hemicolectomy 06/13/2023   Anxiety 06/13/2023   Acute DVT (deep venous thrombosis) (HCC)    Atrophic vaginitis    Umbilical hernia    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) 04/25/2023   Cancer of rectum (HCC) 04/25/2023   Former smoker 02/24/2022   Preop cardiovascular exam 08/31/2020   UTI (urinary tract infection) 04/10/2018   Lower back pain 04/08/2018   Chronic respiratory failure with hypoxia (HCC) 05/03/2017   CAD in native artery 04/17/2017   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   GERD (gastroesophageal reflux disease) 10/24/2016   Chronic allergic rhinitis 01/13/2016   Chest pain 01/13/2016   NSTEMI (non-ST elevated myocardial infarction) (HCC) 12/06/2015   Hyperlipidemia 12/06/2015   COPD exacerbation (HCC) 12/03/2015   Moderate COPD (chronic obstructive pulmonary disease) (HCC) 06/08/2015   H/O steroid therapy 06/08/2015   Essential hypertension 06/08/2015   s/p Procedure(s): COLECTOMY, HAND-ASSISTED, LAPAROSCOPIC REPAIR, HERNIA, VENTRAL 06/13/2023  - Doing great - D/C IVF - Ambulate 5x/day as able; PT/OT - Soft diet - Cont Entereg today - awaiting return of bowel fxn - On 3L [place] (home dose) - [facility] following for assistance in comorbidity mgmt  - appreciate their help - PPX: SQH, SCDs - Path  pending  - We spent time reviewing her procedure, findings and plans. All questions answered   LOS: 1 day   Beatris Lincoln, MD [facility]

## 2023-06-15 DIAGNOSIS — Z9049 Acquired absence of other specified parts of digestive tract: Secondary | ICD-10-CM | POA: Diagnosis not present

## 2023-06-15 LAB — CBC
HCT: 28.4 % — ABNORMAL LOW (ref 36.0–46.0)
Hemoglobin: 8 g/dL — ABNORMAL LOW (ref 12.0–15.0)
MCH: 20.9 pg — ABNORMAL LOW (ref 26.0–34.0)
MCHC: 28.2 g/dL — ABNORMAL LOW (ref 30.0–36.0)
MCV: 74.3 fL — ABNORMAL LOW (ref 80.0–100.0)
Platelets: 434 10*3/uL — ABNORMAL HIGH (ref 150–400)
RBC: 3.82 MIL/uL — ABNORMAL LOW (ref 3.87–5.11)
RDW: 17.1 % — ABNORMAL HIGH (ref 11.5–15.5)
WBC: 10.7 10*3/uL — ABNORMAL HIGH (ref 4.0–10.5)
nRBC: 0 % (ref 0.0–0.2)

## 2023-06-15 LAB — BASIC METABOLIC PANEL WITH GFR
Anion gap: 8 (ref 5–15)
BUN: 9 mg/dL (ref 8–23)
CO2: 27 mmol/L (ref 22–32)
Calcium: 8.3 mg/dL — ABNORMAL LOW (ref 8.9–10.3)
Chloride: 99 mmol/L (ref 98–111)
Creatinine, Ser: 0.43 mg/dL — ABNORMAL LOW (ref 0.44–1.00)
GFR, Estimated: >60 mL/min (ref >60)
Glucose, Bld: 81 mg/dL (ref 70–99)
Potassium: 3.7 mmol/L (ref 3.5–5.1)
Sodium: 134 mmol/L — ABNORMAL LOW (ref 135–145)

## 2023-06-15 MED ORDER — TRAMADOL HCL 50 MG PO TABS: 100.0000 mg | ORAL_TABLET | Freq: Four times a day (QID) | ORAL | Status: DC | PRN

## 2023-06-15 NOTE — Consult Note (Signed)
 Followup Consultation Note   Patient: Erin Morrison:[id number] DOB: 02/11/1950 PCP: Aloha Arnold, PA-C DOA: 06/13/2023 DOS: the patient was seen and examined on 06/15/2023 Primary service: Melvenia Stabs, MD  Referring physician: Dr. Beatris Lincoln Reason for consult: Postop management of medical issues including COPD  Subjective:  Patient complain of pain today, notes that her abdomen is diffusely tender.  Notes she has been taking Tylenol  without good effect.  Patient states she would not want to take any narcotics or any medications that are addictive.  She has taken tramadol  in the past and would like to try that again.  Assessment and Plan:  Postop pain Trial of tramadol  Continue Tylenol  and ibuprofen as already ordered  Postop anemia likely secondary to ABLA H&H stable overnight Patient remains hemodynamically stable with good blood pressure and no tachycardia Would start iron when appropriate as an outpatient  Moderate COPD Chronic respiratory failure with hypoxia Patient is stable and at baseline in regards to her COPD Continue Breztri , montelukast  and Daliresp  Oxygen  as needed  Hyperlipidemia CAD Patient denies any chest pain or shortness of breath different from baseline No evidence of acute coronary issues Continue atorvastatin , antiplatelets per cardiologist as an outpatient  Anxiety and depression Continue escitalopram per home doses Patient is coping well with surgery  GERD HTN Well-controlled off medications   TRH will continue to follow the patient.  HPI: Erin Morrison is a 73 y.o. female with past medical history of DVT, allergic rhinitis, anemia, anxiety, atrophic vaginitis, CAD, history of NSTEMI, rectal cancer, tobacco use in remission, history of pneumonia, COPD, chronic respiratory failure with hypoxia, elevated diaphragm, umbilical hernia, essential hypertension, lower back pain, history of UTI who was referred by Dr. Feliberto Hopping to  Dr. Camilo Cella due to colon cancer recurrence in the hepatic flexure undergoing laparoscopic right hemicolectomy and incisional hernia repair.    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   Seen on last chest x-ray 12/2015, not on x-ray from 12/2015     Essential hypertension 06/08/2015   Former smoker 02/24/2022   GERD (gastroesophageal reflux disease) 10/24/2016   H/O steroid therapy 06/08/2015   Hyperlipidemia 12/06/2015   Hypertension    Low back pain    Lower back pain 04/08/2018   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 Denies   On home oxygen  therapy    Pneumonia    2012   Pre-operative respiratory examination 08/31/2020   Umbilical hernia    UTI (urinary tract infection) 04/10/2018   Past Surgical History:  Procedure Laterality Date   arthroscopic knee surgery  Right    CARDIAC CATHETERIZATION N/A 03/03/2016   Procedure: Right/Left Heart Cath and Coronary Angiography;  Surgeon: Arty Binning, MD;  Location: [facility] INVASIVE CV LAB;  Service: Cardiovascular;  Laterality: N/A;   COLON RESECTION Right 06/13/2023   Procedure: COLECTOMY, HAND-ASSISTED, LAPAROSCOPIC;  Surgeon: Melvenia Stabs, MD;  Location: [facility] ORS;  Service: General;  Laterality: Right;  LAPAROSCOPIC RIGHT HEMICOLECTOMY   COLONOSCOPY N/A 04/20/2023   Procedure: COLONOSCOPY;  Surgeon: Genell Ken, MD;  Location: [facility] ENDOSCOPY;  Service: Gastroenterology;  Laterality: N/A;   ESOPHAGOGASTRODUODENOSCOPY N/A 04/20/2023   Procedure: EGD (ESOPHAGOGASTRODUODENOSCOPY);  Surgeon: Genell Ken, MD;  Location: [facility] ENDOSCOPY;  Service: Gastroenterology;  Laterality: N/A;   HEMOSTASIS CLIP PLACEMENT  04/20/2023   Procedure: CONTROL OF HEMORRHAGE, GI TRACT, ENDOSCOPIC, BY CLIPPING OR OVERSEWING;  Surgeon: Genell Ken, MD;  Location: [facility] ENDOSCOPY;  Service: Gastroenterology;;   POLYPECTOMY  04/20/2023   Procedure: POLYPECTOMY;  Surgeon: Genell Ken, MD;  Location: [facility] ENDOSCOPY;  Service: Gastroenterology;;   TUBAL LIGATION     VENTRAL HERNIA REPAIR N/A 06/13/2023   Procedure: REPAIR, HERNIA, VENTRAL;  Surgeon: Melvenia Stabs, MD;  Location: [facility] ORS;  Service: General;  Laterality: N/A;  VENTRAL HERNIA REPAIR   Social History:  reports that she quit smoking about 13 years ago. Her smoking use included cigarettes. She started smoking about 59 years ago. She has a 69 pack-year smoking history. She has never used smokeless tobacco. She reports that she does not drink alcohol and does not use drugs.  No Known Allergies  Family History  Problem Relation Age of Onset   Emphysema Mother    Cirrhosis Father     Physical Exam: Vitals:   06/14/23 2018 06/15/23 0500 06/15/23 0522 06/15/23 1314  BP: 116/61  (!) 144/79 128/79  Pulse: 75  71 77  Resp: 16  16 17   Temp: 98.1 F (36.7 C)  98 F (36.7 C) 97.7 F (36.5 C)  TempSrc: Oral  Oral   SpO2: 96%  97% 100%  Weight:  67.1 kg    Height:       Patient was sitting up in recliner in excellent spirits chatting with her family members Lungs were clear, cardiac rhythm was regular, abdominal exam with mild TTP as would be expected.  Family Communication: Family was at  bedside throughout     Author: Magdalene School, MD 06/15/2023 2:39 PM  For on call review [email or web address].

## 2023-06-15 NOTE — Plan of Care (Signed)
  Problem: Education: Goal: Understanding of discharge needs will improve Outcome: Progressing Goal: Verbalization of understanding of the causes of altered bowel function will improve Outcome: Progressing   Problem: Activity: Goal: Ability to tolerate increased activity will improve Outcome: Progressing   Problem: Bowel/Gastric: Goal: Gastrointestinal status for postoperative course will improve Outcome: Progressing   Problem: Clinical Measurements: Goal: Will remain free from infection Outcome: Progressing

## 2023-06-15 NOTE — Plan of Care (Signed)
   Problem: Education: Goal: Understanding of discharge needs will improve Outcome: Progressing   Problem: Education: Goal: Knowledge of General Education information will improve Description: Including pain rating scale, medication(s)/side effects and non-pharmacologic comfort measures Outcome: Progressing

## 2023-06-15 NOTE — Progress Notes (Signed)
 Occupational Therapy Treatment and Discharge  Patient Details Name: Erin Morrison MRN: [id number] DOB: [birth year] Today's Date: 06/15/2023   History of present illness Patient is a 73 year old female admitted with colon carcinoma, s/p Laparoscopic right hemicolectomy and incisional hernia repair 06/13/23.  PMH: COPD 2L/min, HLD.   OT comments  Patient seen for skilled OT treatment and discharge session with focus on adaptive strategies and caregiver raining with AE. Patient's husband present for session and able to teach back need for use of gait belt for mobility safety with RW and O2 as well as to use reacher for improved patient functional reach. Patient remained 98% SpO2 on 3 ltrs O2 and has shower seat as needed for home use. No further Acute OT needs with no follow up OT recommendations aside from family assist and support. OT signing off with discharge complete.       If plan is discharge home, recommend the following:  A little help with walking and/or transfers;A little help with bathing/dressing/bathroom;Assistance with cooking/housework;Direct supervision/assist for medications management;Assist for transportation;Help with stairs or ramp for entrance;Direct supervision/assist for financial management   Equipment Recommendations  Other (comment) (husband to purchase reacher online)       Precautions / Restrictions Precautions Precautions: Other (comment) (abdominal) Recall of Precautions/Restrictions: Intact Precaution/Restrictions Comments: abdominal, reviewed log roll technique Restrictions Weight Bearing Restrictions Per Provider Order: No       Mobility Bed Mobility Overal bed mobility:  (patient up in recliner and remained)                  Transfers Overall transfer level: Modified independent Equipment used: Rolling walker (2 wheels) Transfers: Sit to/from Stand, Bed to chair/wheelchair/BSC Sit to Stand: Modified independent (Device/Increase time)      Step pivot transfers: Modified independent (Device/Increase time)     General transfer comment: increased time due to abdoinal precautions     Balance Overall balance assessment: Modified Independent                                         ADL either performed or assessed with clinical judgement   ADL Overall ADL's : Needs assistance/impaired Eating/Feeding: Independent   Grooming: Wash/dry hands;Wash/dry face;Oral care;Sitting;Modified independent   Upper Body Bathing: Modified independent;Sitting   Lower Body Bathing: Sitting/lateral leans;Sit to/from stand;With caregiver independent assisting;Minimal assistance   Upper Body Dressing : Modified independent;Sitting   Lower Body Dressing: Minimal assistance;Sitting/lateral leans;Sit to/from stand Lower Body Dressing Details (indicate cue type and reason): educated on use of reacher and figure 4 techniques with + teach back with patient and husand Toilet Transfer: Supervision/safety;Rolling walker (2 wheels)   Toileting- Clothing Manipulation and Hygiene: Supervision/safety;Sit to/from stand;Sitting/lateral lean       Functional mobility during ADLs: Rolling walker (2 wheels);Modified independent General ADL Comments: education completed with husand present for adapted equip and modified techniques    Extremity/Trunk Assessment Upper Extremity Assessment Upper Extremity Assessment: Overall WFL for tasks assessed   Lower Extremity Assessment Lower Extremity Assessment: Overall WFL for tasks assessed        Vision   Vision Assessment?: No apparent visual deficits         Communication Communication Communication: No apparent difficulties   Cognition Arousal: Alert Behavior During Therapy: WFL for tasks assessed/performed Cognition: No apparent impairments  Following commands: Intact                 General Comments abdominal incision with icing     Pertinent Vitals/ Pain       Pain Assessment Pain Assessment: Faces Pain Score: 4  Pain Location: abdomen with movement Pain Descriptors / Indicators: Grimacing Pain Intervention(s): Limited activity within patient's tolerance, Premedicated before session, Repositioned, Relaxation, Ice applied   Frequency  Min 2X/week        Progress Toward Goals  OT Goals(current goals can now be found in the care plan section)  Progress towards OT goals: Goals met/education completed, patient discharged from OT  ADL Goals Pt Will Perform Lower Body Dressing: with modified independence;with adaptive equipment;sitting/lateral leans Pt Will Transfer to Toilet: with modified independence;regular height toilet;ambulating Pt Will Perform Toileting - Clothing Manipulation and hygiene: with modified independence;sit to/from stand  Plan         AM-PAC OT "6 Clicks" Daily Activity     Outcome Measure   Help from another person eating meals?: None Help from another person taking care of personal grooming?: None Help from another person toileting, which includes using toliet, bedpan, or urinal?: A Little Help from another person bathing (including washing, rinsing, drying)?: A Little Help from another person to put on and taking off regular upper body clothing?: None Help from another person to put on and taking off regular lower body clothing?: A Little 6 Click Score: 21    End of Session Equipment Utilized During Treatment: Rolling walker (2 wheels);Oxygen   OT Visit Diagnosis: Unsteadiness on feet (R26.81);Other abnormalities of gait and mobility (R26.89);Pain   Activity Tolerance Patient tolerated treatment well;Patient limited by pain   Patient Left in chair;with call bell/phone within reach;with family/visitor present   Nurse Communication Mobility status        Time: 7829-5621 OT Time Calculation (min): 19 min  Charges: OT General Charges $OT Visit: 1 Visit OT  Treatments $Self Care/Home Management : 8-22 mins Jernee Murtaugh OT/L Acute Rehabilitation Department  [phone]  06/15/2023, 9:25 AM

## 2023-06-16 LAB — BASIC METABOLIC PANEL WITH GFR
Anion gap: 9 (ref 5–15)
BUN: 8 mg/dL (ref 8–23)
CO2: 27 mmol/L (ref 22–32)
Calcium: 9.2 mg/dL (ref 8.9–10.3)
Chloride: 101 mmol/L (ref 98–111)
Creatinine, Ser: 0.52 mg/dL (ref 0.44–1.00)
GFR, Estimated: >60 mL/min (ref >60)
Glucose, Bld: 93 mg/dL (ref 70–99)
Potassium: 3.6 mmol/L (ref 3.5–5.1)
Sodium: 137 mmol/L (ref 135–145)

## 2023-06-16 LAB — CBC
HCT: 29.9 % — ABNORMAL LOW (ref 36.0–46.0)
Hemoglobin: 8.4 g/dL — ABNORMAL LOW (ref 12.0–15.0)
MCH: 20.8 pg — ABNORMAL LOW (ref 26.0–34.0)
MCHC: 28.1 g/dL — ABNORMAL LOW (ref 30.0–36.0)
MCV: 74 fL — ABNORMAL LOW (ref 80.0–100.0)
Platelets: 498 10*3/uL — ABNORMAL HIGH (ref 150–400)
RBC: 4.04 MIL/uL (ref 3.87–5.11)
RDW: 17.1 % — ABNORMAL HIGH (ref 11.5–15.5)
WBC: 12.3 10*3/uL — ABNORMAL HIGH (ref 4.0–10.5)
nRBC: 0 % (ref 0.0–0.2)

## 2023-06-16 MED ORDER — METHOCARBAMOL 500 MG PO TABS: 500.0000 mg | ORAL_TABLET | Freq: Four times a day (QID) | ORAL | 0 refills | Status: DC

## 2023-06-16 MED ORDER — TRAMADOL HCL 50 MG PO TABS: 50.0000 mg | ORAL_TABLET | Freq: Four times a day (QID) | ORAL | 0 refills | Status: DC | PRN

## 2023-06-16 NOTE — Progress Notes (Signed)
 Discharge instructions given to patient and all questions were answered.

## 2023-06-16 NOTE — Discharge Instructions (Signed)
 COLECTOMY POST OPERATIVE INSTRUCTIONS  Thinking Clearly  The anesthesia may cause you to feel different for 1 or 2 days. Do not drive, drink alcohol, or make any big decisions for at least 2 days.  Nutrition When you wake up, you will be able to drink small amounts of liquid. If you do not feel sick, you can slowly advance your diet to regular foods. Continue to drink lots of fluids, usually about 8 to 10 glasses per day. Eat a high-fiber diet so you don't strain during bowel movements. High-Fiber Foods Foods high in fiber include beans, bran cereals and whole-grain breads, peas, dried fruit (figs, apricots, and dates), raspberries, blackberries, strawberries, sweet corn, broccoli, baked potatoes with skin, plums, pears, apples, greens, and nuts. Activity Slowly increase your activity. Be sure to get up and walk every hour or so to prevent blood clots. No heavy lifting or strenuous activity for 4 weeks following surgery to prevent hernias at your incision sites It is normal to feel tired. You may need more sleep than usual.  Get your rest but make sure to get up and move around frequently to prevent blood clots and pneumonia.  Work and Return to School You can go back to work when you feel well enough. Discuss the timing with your surgeon. You can usually go back to school or work 1 week or less after an laparoscopic surgery and two weeks after an open surgery. If your work requires heavy lifting or strenuous activity you need to be placed on light duty for 4 weeks following surgery. You can return to gym class, sports or other physical activities 4 weeks after surgery.  Wound Care Always wash your hands before and after touching near your incision site. Do not soak in a bathtub until cleared at your follow up appointment. You may take a shower 24 hours after surgery. A small amount of drainage from the incision is normal. If the drainage is thick and yellow or the site is red, you may  have an infection, so call your surgeon. If you have a drain in one of your incisions, it will be taken out in office when the drainage stops. Steri-Strips will fall off in 7 to 10 days or they will be removed during your first office visit. If you have dermabond glue covering over the incision, allow the glue to flake off on its own. Avoid wearing tight or rough clothing. It may rub your incisions and make it harder for them to heal. Protect the new skin, especially from the sun. The sun can burn and cause darker scarring. Your scar will heal in about 4 to 6 weeks and will become softer and continue to fade over the next year.  The cosmetic appearance of the incisions will improve over the course of the first year after surgery. Sensation around your incision will return in a few weeks or months.  Bowel Movements After intestinal surgery, you may have loose watery stools for several days. If watery diarrhea lasts longer than 3 days, contact your surgeon. Pain medication (narcotics) can cause constipation. Increase the fiber in your diet with high-fiber foods if you are constipated. You can take an over the counter stool softener like Colace to avoid constipation.  Additional over the counter medications can also be used if Colace isn't sufficient (for example, Milk of Magnesia or Miralax).  Pain The amount of pain is different for each person. Some people need only 1 to 3 doses of pain control   medication, while others need more. Take alternating doses of tylenol and ibuprofen around the clock for the first five days following surgery.  This will provide a baseline of pain control and help with inflammation.  Take the narcotic pain medication in addition if needed for severe pain.  Contact Your Surgeon at [phone], if you have: Pain that will not go away Pain that gets worse A fever of more than 101F (38.3C) Repeated vomiting Swelling, redness, bleeding, or bad-smelling drainage from your  wound site Strong abdominal pain No bowel movement or unable to pass gas for 3 days Watery diarrhea lasting longer than 3 days  Pain Control The goal of pain control is to minimize pain, keep you moving and help you heal. Your surgical team will work with you on your pain plan. Most often a combination of therapies and medications are used to control your pain. You may also be given medication (local anesthetic) at the surgical site. This may help control your pain for several days. Extreme pain puts extra stress on your body at a time when your body needs to focus on healing. Do not wait until your pain has reached a level "10" or is unbearable before telling your doctor or nurse. It is much easier to control pain before it becomes severe. Following a laparoscopic procedure, pain is sometimes felt in the shoulder. This is due to the gas inserted into your abdomen during the procedure. Moving and walking helps to decrease the gas and the right shoulder pain.  Use the guide below for ways to manage your post-operative pain. Learn more by going to facs.org/safepaincontrol.  How Intense Is My Pain Common Therapies to Feel Better       I hardly notice my pain, and it does not interfere with my activities.  I notice my pain and it distracts me, but I can still do activities (sitting up, walking, standing).  Non-Medication Therapies  Ice (in a bag, applied over clothing at the surgical site), elevation, rest, meditation, massage, distraction (music, TV, play) walking and mild exercise Splinting the abdomen with pillows +  Non-Opioid Medications Acetaminophen (Tylenol) Non-steroidal anti-inflammatory drugs (NSAIDS) Aspirin, Ibuprofen (Motrin, Advil) Naproxen (Aleve) Take these as needed, when you feel pain. Both acetaminophen and NSAIDs help to decrease pain and swelling (inflammation).      My pain is hard to ignore and is more noticeable even when I rest.  My pain interferes with  my usual activities.  Non-Medication Therapies  +  Non-Opioid medications  Take on a regular schedule (around-the-clock) instead of as needed. (For example, Tylenol every 6 hours at 9:00 am, 3:00 pm, 9:00 pm, 3:00 am and Motrin every 6 hours at 12:00 am, 6:00 am, 12:00 pm, 6:00 pm)         I am focused on my pain, and I am not doing my daily activities.  I am groaning in pain, and I cannot sleep. I am unable to do anything.  My pain is as bad as it could be, and nothing else matters.  Non-Medication Therapies  +  Around-the-Clock Non-Opioid Medications  +  Short-acting opioids  Opioids should be used with other medications to manage severe pain. Opioids block pain and give a feeling of euphoria (feel high). Addiction, a serious side effect of opioids, is rare with short-term (a few days) use.  Examples of short-acting opioids include: Tramadol (Ultram), Hydrocodone (Norco, Vicodin), Hydromorphone (Dilaudid), Oxycodone (Oxycontin)     The above directions have been adapted from   the American College [employer] Surgical Patient Education Program.  Please refer to the ACS website if needed: https://www.facs.org/-/media/files/education/patient-ed/colectomy.ashx   Paul Stechschulte, MD [facility] [address], [address], [place], [place]  [address] ?  [address] [address], , [place]   [address] [phone] ? [phone] ? FAX [phone] Web site: [email or web address]  

## 2023-06-16 NOTE — Plan of Care (Signed)
   Problem: Education: Goal: Understanding of discharge needs will improve Outcome: Progressing   Problem: Education: Goal: Knowledge of General Education information will improve Description: Including pain rating scale, medication(s)/side effects and non-pharmacologic comfort measures Outcome: Progressing

## 2023-06-16 NOTE — TOC Transition Note (Signed)
 Transition of Care [facility]) - Discharge Note  Patient Details  Name: Erin Morrison MRN: [id number] Date of Birth: 1950/07/06  [facility] of [facility]) CM/SW Contact:  Zenon Hilda, LCSW Phone Number: 06/16/2023, 10:41 AM  Clinical Narrative: No PT/OT follow up or DME recommended at this time. TOC signing off.  Final next level of care: Home/Self Care Barriers to Discharge: Barriers Resolved  Patient Goals and CMS Choice Patient states their goals for this hospitalization and ongoing recovery are:: return home Choice offered to / list presented to : NA  Discharge Plan and Services Additional resources added to the After Visit Summary for           DME Arranged: N/A DME Agency: NA  Social Drivers of Health (SDOH) Interventions SDOH Screenings   Food Insecurity: No Food Insecurity (06/13/2023)  Housing: Low Risk  (06/13/2023)  Transportation Needs: No Transportation Needs (06/13/2023)  Utilities: Not At Risk (06/13/2023)  Social Connections: Socially Isolated (06/13/2023)  Tobacco Use: Medium Risk (06/13/2023)   Readmission Risk Interventions    06/14/2023   10:40 AM  Readmission Risk Prevention Plan  Post Dischage Appt Complete  Medication Screening Complete  Transportation Screening Complete

## 2023-06-16 NOTE — Discharge Summary (Signed)
 Patient ID: Erin Morrison [id number] 73 y.o. 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) 04/25/2023   Cancer of rectum (HCC) 04/25/2023   Former smoker 02/24/2022   Preop cardiovascular exam 08/31/2020   UTI (urinary tract infection) 04/10/2018   Lower back pain 04/08/2018   Chronic respiratory failure with hypoxia (HCC) 05/03/2017   CAD in native artery 04/17/2017   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   GERD (gastroesophageal reflux disease) 10/24/2016   Chronic allergic rhinitis 01/13/2016   Chest pain 01/13/2016   NSTEMI (non-ST elevated myocardial infarction) (HCC) 12/06/2015   Hyperlipidemia 12/06/2015   COPD exacerbation (HCC) 12/03/2015   Moderate COPD (chronic obstructive pulmonary disease) (HCC) 06/08/2015   H/O steroid therapy 06/08/2015   Essential hypertension 06/08/2015     Discharge Diagnoses:  Patient Active Problem List   Diagnosis Date Noted   S/P right hemicolectomy 06/13/2023   Anxiety 06/13/2023   Acute DVT (deep venous thrombosis) (HCC)    Atrophic vaginitis    Umbilical hernia    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) 04/25/2023   Cancer of rectum (HCC) 04/25/2023   Former smoker 02/24/2022   Preop cardiovascular exam 08/31/2020   UTI (urinary tract infection) 04/10/2018   Lower back pain 04/08/2018   Chronic respiratory failure with hypoxia (HCC) 05/03/2017   CAD in native artery 04/17/2017   Elevated  diaphragm 01/01/2017   GERD (gastroesophageal reflux disease) 10/24/2016   Chronic allergic rhinitis 01/13/2016   Chest pain 01/13/2016   NSTEMI (non-ST elevated myocardial infarction) (HCC) 12/06/2015   Hyperlipidemia 12/06/2015   COPD exacerbation (HCC) 12/03/2015   Moderate COPD (chronic obstructive pulmonary disease) (HCC) 06/08/2015   H/O steroid therapy 06/08/2015   Essential hypertension 06/08/2015    Operations: Procedure(s): COLECTOMY, HAND-ASSISTED, LAPAROSCOPIC REPAIR, HERNIA, VENTRAL  Admission Condition: good  Discharged Condition: good  Indication for Admission: Hepatic flexure colon cancer  Hospital Course:  Laparoscopic right hemicolectomy  Incisional hernia repair, reducible - 5 x 5 cm 06/13/23 - Dr. Camilo Cella  Consults: internal medicine team consulted  Significant Diagnostic Studies: None  Treatments: surgery: as above  Disposition: Home  Patient Instructions:  Allergies as of 06/16/2023   No Known Allergies      Medication List     TAKE these medications    acetaminophen  500 MG tablet Commonly known as: TYLENOL  Take 1,000 mg by mouth 2 (two) times daily as needed for moderate pain (pain score 4-6) or headache.   albuterol  (2.5 MG/3ML) 0.083% nebulizer solution Commonly known as: PROVENTIL  Take 2.5 mg by nebulization every 6 (six) hours as needed for wheezing or shortness of breath.   albuterol  108 (90 Base) MCG/ACT inhaler Commonly known as: VENTOLIN  HFA Inhale 2 puffs into the lungs every 6 (six) hours as needed for wheezing or shortness of breath.   atorvastatin  40 MG tablet Commonly known as: LIPITOR TAKE 1 TABLET BY MOUTH EVERY DAY AT 6PM   Breztri  Aerosphere 160-9-4.8 MCG/ACT Aero inhaler Generic drug: budesonide -glycopyrrolate -formoterol  Inhale 2 puffs into the lungs in  the morning and at bedtime.   escitalopram  10 MG tablet Commonly known as: LEXAPRO  Take 10 mg by mouth daily.   IRON PO Take 25 mg by mouth daily.    methocarbamol 500 MG tablet Commonly known as: ROBAXIN Take 1 tablet (500 mg total) by mouth 4 (four) times daily.   montelukast  10 MG tablet Commonly known as: SINGULAIR  TAKE 1 TABLET BY MOUTH EVERYDAY AT BEDTIME   roflumilast  500 MCG Tabs tablet Commonly known as: DALIRESP  Take 1 tablet (500 mcg total) by mouth daily.   traMADol  50 MG tablet Commonly known as: Ultram  Take 1 tablet (50 mg total) by mouth every 6 (six) hours as needed for severe pain (pain score 7-10).        Activity: no heavy lifting for 4 weeks Diet: regular diet Wound Care: keep wound clean and dry  Follow-up:  With Dr. Camilo Cella Signed: Avon Boers Nija Koopman [facility], [place]   06/16/2023, 10:12 AM

## 2023-06-18 DIAGNOSIS — J449 Chronic obstructive pulmonary disease, unspecified: Secondary | ICD-10-CM | POA: Diagnosis not present

## 2023-06-18 DIAGNOSIS — J9601 Acute respiratory failure with hypoxia: Secondary | ICD-10-CM | POA: Diagnosis not present

## 2023-06-18 DIAGNOSIS — J410 Simple chronic bronchitis: Secondary | ICD-10-CM | POA: Diagnosis not present

## 2023-06-18 LAB — SURGICAL PATHOLOGY

## 2023-06-19 ENCOUNTER — Ambulatory Visit: Payer: Self-pay | Admitting: Surgery

## 2023-06-20 ENCOUNTER — Other Ambulatory Visit: Payer: Self-pay

## 2023-06-22 NOTE — Progress Notes (Signed)
 The proposed treatment discussed in conference is for discussion purpose only and is not a binding recommendation.  The patients have not been physically examined, or presented with their treatment options.  Therefore, final treatment plans cannot be decided.

## 2023-06-27 ENCOUNTER — Other Ambulatory Visit: Payer: Self-pay

## 2023-06-27 DIAGNOSIS — C183 Malignant neoplasm of hepatic flexure: Secondary | ICD-10-CM

## 2023-06-27 DIAGNOSIS — D509 Iron deficiency anemia, unspecified: Secondary | ICD-10-CM

## 2023-06-27 DIAGNOSIS — C20 Malignant neoplasm of rectum: Secondary | ICD-10-CM

## 2023-06-28 ENCOUNTER — Other Ambulatory Visit

## 2023-06-28 ENCOUNTER — Ambulatory Visit: Admitting: Hematology

## 2023-06-29 ENCOUNTER — Inpatient Hospital Stay: Attending: Nurse Practitioner | Admitting: Hematology

## 2023-06-29 ENCOUNTER — Inpatient Hospital Stay: Attending: Nurse Practitioner

## 2023-06-29 VITALS — BP 122/60 | HR 89 | Temp 97.5°F | Resp 13 | Ht 62.0 in | Wt 139.7 lb

## 2023-06-29 DIAGNOSIS — Z87891 Personal history of nicotine dependence: Secondary | ICD-10-CM | POA: Diagnosis not present

## 2023-06-29 DIAGNOSIS — D509 Iron deficiency anemia, unspecified: Secondary | ICD-10-CM

## 2023-06-29 DIAGNOSIS — C20 Malignant neoplasm of rectum: Secondary | ICD-10-CM

## 2023-06-29 DIAGNOSIS — C183 Malignant neoplasm of hepatic flexure: Secondary | ICD-10-CM | POA: Insufficient documentation

## 2023-06-29 DIAGNOSIS — C19 Malignant neoplasm of rectosigmoid junction: Secondary | ICD-10-CM

## 2023-06-29 DIAGNOSIS — D649 Anemia, unspecified: Secondary | ICD-10-CM | POA: Insufficient documentation

## 2023-06-29 LAB — CBC WITH DIFFERENTIAL (CANCER CENTER ONLY)
Abs Immature Granulocytes: 0.04 10*3/uL (ref 0.00–0.07)
Basophils Absolute: 0.1 10*3/uL (ref 0.0–0.1)
Basophils Relative: 1 %
Eosinophils Absolute: 0.3 10*3/uL (ref 0.0–0.5)
Eosinophils Relative: 3 %
HCT: 32.4 % — ABNORMAL LOW (ref 36.0–46.0)
Hemoglobin: 9.3 g/dL — ABNORMAL LOW (ref 12.0–15.0)
Immature Granulocytes: 0 %
Lymphocytes Relative: 23 %
Lymphs Abs: 2.3 10*3/uL (ref 0.7–4.0)
MCH: 21 pg — ABNORMAL LOW (ref 26.0–34.0)
MCHC: 28.7 g/dL — ABNORMAL LOW (ref 30.0–36.0)
MCV: 73.3 fL — ABNORMAL LOW (ref 80.0–100.0)
Monocytes Absolute: 0.7 10*3/uL (ref 0.1–1.0)
Monocytes Relative: 7 %
Neutro Abs: 6.4 10*3/uL (ref 1.7–7.7)
Neutrophils Relative %: 66 %
Platelet Count: 398 10*3/uL (ref 150–400)
RBC: 4.42 MIL/uL (ref 3.87–5.11)
RDW: 18.9 % — ABNORMAL HIGH (ref 11.5–15.5)
WBC Count: 9.8 10*3/uL (ref 4.0–10.5)
nRBC: 0 % (ref 0.0–0.2)

## 2023-06-29 LAB — CMP (CANCER CENTER ONLY)
ALT: 14 U/L (ref 0–44)
AST: 15 U/L (ref 15–41)
Albumin: 4.2 g/dL (ref 3.5–5.0)
Alkaline Phosphatase: 83 U/L (ref 38–126)
Anion gap: 8 (ref 5–15)
BUN: 16 mg/dL (ref 8–23)
CO2: 29 mmol/L (ref 22–32)
Calcium: 9.8 mg/dL (ref 8.9–10.3)
Chloride: 103 mmol/L (ref 98–111)
Creatinine: 0.64 mg/dL (ref 0.44–1.00)
GFR, Estimated: >60 mL/min (ref >60)
Glucose, Bld: 102 mg/dL — ABNORMAL HIGH (ref 70–99)
Potassium: 3.9 mmol/L (ref 3.5–5.1)
Sodium: 140 mmol/L (ref 135–145)
Total Bilirubin: 0.6 mg/dL (ref 0.0–1.2)
Total Protein: 7.3 g/dL (ref 6.5–8.1)

## 2023-06-29 LAB — IRON AND IRON BINDING CAPACITY (CC-WL,HP ONLY)
Iron: 182 ug/dL — ABNORMAL HIGH (ref 28–170)
Saturation Ratios: 37 % — ABNORMAL HIGH (ref 10.4–31.8)
TIBC: 494 ug/dL — ABNORMAL HIGH (ref 250–450)
UIBC: 312 ug/dL (ref 148–442)

## 2023-06-29 LAB — FERRITIN: Ferritin: 22 ng/mL (ref 11–307)

## 2023-06-29 LAB — VITAMIN B12: Vitamin B-12: 257 pg/mL (ref 180–914)

## 2023-06-29 LAB — CEA (ACCESS): CEA (CHCC): 76.98 ng/mL — ABNORMAL HIGH (ref 0.00–5.00)

## 2023-06-29 NOTE — Progress Notes (Signed)
 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  - She underwent right hemicolectomy on Jun 13, 2023. - Due to her advanced age and her medical, we decided to surveillance her rectal cancer. - I recommend a ctDNA guardianReveal, if negative will not offer adjuvant chemo    Assessment & Plan Colon cancer, stage 2 Stage 2 colon cancer with complete surgical resection and negative lymph nodes. There is a 20% risk of recurrence. She prefers traditional monitoring without circulating tumor DNA testing. -She is not interested in adjuvant chemotherapy - Order colonoscopy in three months to monitor for recurrence - Communicate with Dr. Camilo Cella and Dr. Audrie Blind regarding follow-up colonoscopy  Rectal cancer, status post endoscopy removal Rectal cancer was removed during colonoscopy. Concern exists about residual disease due to the inability to perform an MRI previously. She prefers to wait on further imaging. - Order x-ray to check for clip presence - If clip is gone, order MRI to assess rectum - Schedule endoscopy every three months to monitor for recurrence - Alternate endoscopy with MRI every three to six months  Anemia Anemia is  improving post-surgery with current hemoglobin at 9.3. She is on oral iron supplements. Further management depends on iron levels. - Check iron levels - Continue oral iron supplements - Consider IV iron if iron levels are very low  Plan - I reviewed her surgical pathology - I recommend ctDNA test, she declined - She has a follow-up with Dr. Camilo Cella, I will send a message to Dr. Camilo Cella and Dr. West Hammer for sigmoidoscopy to monitor her rectal cancer, which is due in a month - Follow-up in 3 months, I will order pelvic MRI next time.   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 Marked right-sided colonic wall thickening with relatively mild surrounding edema. Findings suspicious for colon carcinoma. Relatively focal infectious or ischemic colitis felt less likely. 2. Borderline ileocolic mesenteric adenopathy, suspicious for regional nodal metastasis. No evidence of distant metastasis. 3. Infrarenal abdominal aortic aneurysm with mild extension into the right common iliac. Recommend follow-up ultrasound every 3 years.  (Ref.: J Vasc Surg. 2018; 67:2-77 and J Am Coll Radiol 2013;10(10):789-794.) 4. 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 Differential considerations include liposarcoma, teratoma, or extramedullary hematopoiesis. This can be re-evaluated on follow-up CT at 6 months. 5. Incidental findings, including: Aortic atherosclerosis (ICD10-I70.0), coronary artery atherosclerosis and emphysema (ICD10-J43.9). Fat containing ventral abdominopelvic and right inguinal hernias   04/20/2023 Procedure   Endoscopy  Normal esophagus with a widely patent Schatzki ring.  There is a 1 cm hiatal hernia.  She has erosive gastropathy and a normal duodenum.   --Biopsies taken  from the duodenum show chronic peptic duodenitis, negative for dysplasia.  Biopsies  from the stomach show chronic, inactive gastritis.  Biopsies were  negative for H. pylori and negative for dysplasia.     04/20/2023 Procedure   Colonoscopy  Malignant, partially obstructing tumor at the hepatic flexure.  There were three 6 to 9 mm polyps in the transverse colon.  A 5 cm polyp in the rectum (15 cm from the anus) was removed piecemeal.  It was resected and retrieved.  A 2 cm polyp in the rectum (10 cm from the anus) was removed piecemeal.  It was resected and retrieved.  There were nonbleeding, internal hemorrhoids and diverticulosis in the sigmoid colon.   --Biopsy of the tumor at hepatic flexure was positive for invasive adenocarcinoma, moderately differentiated.  MMR is abnormal.   --Biopsies of the polyps from transverse colon showed tubular adenoma, negative for high-grade dysplasia.   --Biopsy of 5 cm polyp in the rectum, 15 cm from the anus, showed invasive adenocarcinoma, moderately differentiated.  It was arising from tubular adenoma with high-grade dysplasia.  MMR protein is preserved.   --Biopsy of 2 cm polyp, 10 cm from the anus, showed tubular adenoma, negative for high-grade dysplasia or invasive carcinoma.   04/20/2023 Pathology Results   --Biopsy of the tumor at hepatic flexure was positive for invasive adenocarcinoma, moderately differentiated.  MMR is abnormal.   --Biopsies of the polyps from transverse colon showed tubular adenoma, negative for high-grade dysplasia.   --Biopsy of 5 cm polyp in the rectum, 15 cm from the anus, showed invasive adenocarcinoma, moderately differentiated.  It was arising from tubular adenoma with high-grade dysplasia.  MMR protein is preserved.   --Biopsy of 2 cm polyp, 10 cm from the anus, showed tubular adenoma, negative for high-grade dysplasia or invasive carcinoma.   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  History of Present Illness Erin Morrison is a 73 year old female with colon and rectal cancer who presents for follow-up after surgery.  She is recovering well from surgery performed two weeks ago with no pain or discomfort at the incision site. The colon cancer was removed and identified as stage two with no lymph node involvement. The rectal cancer was removed during a colonoscopy, but further imaging has been delayed due to previous MRI limitations.  She is taking iron supplements as part of her treatment regimen. Recent blood tests show improvement in her blood counts, with a hemoglobin level of 9.3. She continues to monitor her iron levels.  No abnormal bowel movements, bleeding, or other gastrointestinal symptoms.     All other systems were reviewed with the patient and are negative.  MEDICAL HISTORY:  Past Medical History:  Diagnosis Date   Acute DVT (deep venous thrombosis) (HCC)    Allergic rhinitis    Anemia    Anxiety    Atrophic vaginitis    CAD in native artery  04/17/2017   Cancer of rectum (HCC) 04/25/2023   Chest pain 01/13/2016   Chronic allergic rhinitis 01/13/2016   Chronic respiratory failure with hypoxia (HCC) 05/03/2017   COPD (chronic obstructive pulmonary disease) (HCC)    COPD exacerbation (HCC) 12/03/2015   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   Seen on last chest x-ray 12/2015, not on x-ray from 12/2015     Essential hypertension 06/08/2015   Former smoker 02/24/2022   GERD (gastroesophageal reflux disease) 10/24/2016    H/O steroid therapy 06/08/2015   Hyperlipidemia 12/06/2015   Hypertension    Low back pain    Lower back pain 04/08/2018   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 Denies   On home oxygen  therapy    Pneumonia    2012   Pre-operative respiratory examination 08/31/2020   Umbilical hernia    UTI (urinary tract infection) 04/10/2018    SURGICAL HISTORY: Past Surgical History:  Procedure Laterality Date   arthroscopic knee surgery Right    CARDIAC CATHETERIZATION N/A 03/03/2016   Procedure: Right/Left Heart Cath and Coronary Angiography;  Surgeon: Arty Binning, MD;  Location: [facility] INVASIVE CV LAB;  Service: Cardiovascular;  Laterality: N/A;   COLON RESECTION Right 06/13/2023   Procedure: COLECTOMY, HAND-ASSISTED, LAPAROSCOPIC;  Surgeon: Melvenia Stabs, MD;  Location: [facility] ORS;  Service: General;  Laterality: Right;  LAPAROSCOPIC RIGHT HEMICOLECTOMY   COLONOSCOPY N/A 04/20/2023   Procedure: COLONOSCOPY;  Surgeon: Genell Ken, MD;  Location: [facility] ENDOSCOPY;  Service: Gastroenterology;  Laterality: N/A;   ESOPHAGOGASTRODUODENOSCOPY N/A 04/20/2023   Procedure: EGD (ESOPHAGOGASTRODUODENOSCOPY);  Surgeon: Genell Ken, MD;  Location: [facility] ENDOSCOPY;  Service: Gastroenterology;  Laterality: N/A;   HEMOSTASIS CLIP PLACEMENT  04/20/2023   Procedure: CONTROL OF HEMORRHAGE, GI TRACT, ENDOSCOPIC, BY CLIPPING OR OVERSEWING;  Surgeon: Genell Ken, MD;  Location: [facility] ENDOSCOPY;  Service: Gastroenterology;;   POLYPECTOMY  04/20/2023   Procedure: POLYPECTOMY;  Surgeon: Genell Ken, MD;  Location: [facility] ENDOSCOPY;  Service:  Gastroenterology;;   TUBAL LIGATION     VENTRAL HERNIA REPAIR N/A 06/13/2023   Procedure: REPAIR, HERNIA, VENTRAL;  Surgeon: Melvenia Stabs, MD;  Location: [facility] ORS;  Service: General;  Laterality: N/A;  VENTRAL HERNIA REPAIR    I have reviewed the social history and family history with the patient and they are unchanged from previous note.  ALLERGIES:  has no known allergies.  MEDICATIONS:  Current Outpatient Medications  Medication Sig Dispense Refill   acetaminophen  (TYLENOL ) 500 MG tablet Take 1,000 mg by mouth 2 (two) times daily as needed for moderate pain (pain score 4-6) or headache.     albuterol  (PROVENTIL ) (2.5 MG/3ML) 0.083% nebulizer solution Take 2.5 mg by nebulization every 6 (six) hours as needed for wheezing or shortness of breath.     albuterol  (VENTOLIN  HFA) 108 (90 Base) MCG/ACT inhaler Inhale 2 puffs into the lungs every 6 (six) hours as needed for wheezing or shortness of breath. 18 g 3   atorvastatin  (LIPITOR) 40 MG tablet TAKE 1 TABLET BY MOUTH EVERY DAY AT 6PM 15 tablet 0  Budeson-Glycopyrrol-Formoterol  (BREZTRI  AEROSPHERE) 160-9-4.8 MCG/ACT AERO Inhale 2 puffs into the lungs in the morning and at bedtime. 10.7 each 11   escitalopram  (LEXAPRO ) 10 MG tablet Take 10 mg by mouth daily.     Ferrous Sulfate  (IRON PO) Take 25 mg by mouth daily.     methocarbamol  (ROBAXIN ) 500 MG tablet Take 1 tablet (500 mg total) by mouth 4 (four) times daily. 30 tablet 0   montelukast  (SINGULAIR ) 10 MG tablet TAKE 1 TABLET BY MOUTH EVERYDAY AT BEDTIME 90 tablet 3   roflumilast  (DALIRESP ) 500 MCG TABS tablet Take 1 tablet (500 mcg total) by mouth daily. 90 tablet 3   traMADol  (ULTRAM ) 50 MG tablet Take 1 tablet (50 mg total) by mouth every 6 (six) hours as needed for severe pain (pain score 7-10). 20 tablet 0   No current facility-administered medications for this visit.    PHYSICAL EXAMINATION: ECOG PERFORMANCE STATUS: 1 - Symptomatic but completely ambulatory  Vitals:    06/29/23 1005  BP: 122/60  Pulse: 89  Resp: 13  Temp: (!) 97.5 F (36.4 C)  SpO2: 98%   Wt Readings from Last 3 Encounters:  06/29/23 139 lb 11.2 oz (63.4 kg)  06/16/23 150 lb 12.7 oz (68.4 kg)  06/08/23 138 lb (62.6 kg)     GENERAL:alert, no distress and comfortable SKIN: skin color, texture, turgor are normal, no rashes or significant lesions EYES: normal, Conjunctiva are pink and non-injected, sclera clear NECK: supple, thyroid normal size, non-tender, without nodularity LYMPH:  no palpable lymphadenopathy in the cervical, axillary  LUNGS: clear to auscultation and percussion with normal breathing effort HEART: regular rate & rhythm and no murmurs and no lower extremity edema ABDOMEN:abdomen soft, non-tender and normal bowel sounds, midline incision is healing well. Musculoskeletal:no cyanosis of digits and no clubbing  NEURO: alert & oriented x 3 with fluent speech, no focal motor/sensory deficits  Physical Exam   LABORATORY DATA:  I have reviewed the data as listed    Latest Ref Rng & Units 06/29/2023    9:53 AM 06/16/2023    5:23 AM 06/15/2023    4:24 AM  CBC  WBC 4.0 - 10.5 K/uL 9.8  12.3  10.7   Hemoglobin 12.0 - 15.0 g/dL 9.3  8.4  8.0   Hematocrit 36.0 - 46.0 % 32.4  29.9  28.4   Platelets 150 - 400 K/uL 398  498  434         Latest Ref Rng & Units 06/29/2023    9:53 AM 06/16/2023    5:23 AM 06/15/2023    4:24 AM  CMP  Glucose 70 - 99 mg/dL 409  93  81   BUN 8 - 23 mg/dL 16  8  9    Creatinine 0.44 - 1.00 mg/dL 8.11  9.14  7.82   Sodium 135 - 145 mmol/L 140  137  134   Potassium 3.5 - 5.1 mmol/L 3.9  3.6  3.7   Chloride 98 - 111 mmol/L 103  101  99   CO2 22 - 32 mmol/L 29  27  27    Calcium  8.9 - 10.3 mg/dL 9.8  9.2  8.3   Total Protein 6.5 - 8.1 g/dL 7.3     Total Bilirubin 0.0 - 1.2 mg/dL 0.6     Alkaline Phos 38 - 126 U/L 83     AST 15 - 41 U/L 15     ALT 0 - 44 U/L 14  RADIOGRAPHIC STUDIES: I have personally reviewed the radiological  images as listed and agreed with the findings in the report. No results found.    No orders of the defined types were placed in this encounter.  All questions were answered. The patient knows to call the clinic with any problems, questions or concerns. No barriers to learning was detected. The total time spent in the appointment was 30 minutes, including review of chart and various tests results, discussions about plan of care and coordination of care plan     Sonja , MD 06/29/2023

## 2023-06-29 NOTE — Assessment & Plan Note (Addendum)
 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  - She underwent right hemicolectomy on Jun 13, 2023. - Due to her advanced age and her medical, we decided to surveillance her rectal cancer. - I recommend a ctDNA guardianReveal, if negative will not offer adjuvant chemo

## 2023-07-01 ENCOUNTER — Ambulatory Visit: Payer: Self-pay | Admitting: Hematology

## 2023-07-02 ENCOUNTER — Ambulatory Visit: Payer: Medicare Other | Admitting: Primary Care

## 2023-07-02 ENCOUNTER — Telehealth: Payer: Self-pay

## 2023-07-02 NOTE — Telephone Encounter (Signed)
 Spoke with Erin Morrison and spouse regarding recent lab results.  Stated that Dr. Maryalice Smaller has reviewed her labs and her B12 is below normal; therefore, Dr. Maryalice Smaller would like for the Erin Morrison to take oral supplement B12 1,000mcg daily.  Stated the B12 can be purchased in the Vitamin Section of [employer] or any pharmacy.  Also, stated that the Erin Morrison's CEA results were slightly lower post surgery but are still higher than normal which is an indication of residual cancer.  Stated at time of office visit, Dr. Maryalice Smaller discussed ctDNA molecular testing, which the Erin Morrison declined, that assist in helping to monitor circulating tumor DNA in the blood.  Erin Morrison state she would discuss further with Dr. Maryalice Smaller later in clinic.  Stated Dr. Maryalice Smaller has reached out to both Dr. Rickford Charnley and Dr. Feliberto Hopping regarding doing a colonoscopy.  Erin Morrison's spouse stated they spoke with Dr. Camilo Cella today in clinic and he suggested they contact Dr. Shelle Devon office regarding scheduling the colonoscopy.  Stated that is the colonoscopy is negative, Dr. Maryalice Smaller would like to do a pelvic MRI.  Both Erin Morrison and spouse verbalized understanding and stated they with reach out to Dr. Shelle Devon office to get the colonoscopy scheduled.  Stated that Dr. Maryalice Smaller would like for the Erin Morrison to come in mid-July to have labs done at which time another CEA will be drawn and a f/u visit afterwards.  Stated Dr. Candise Chambers scheduler will contact them to get the Erin Morrison scheduled for the lab and f/u visit.  Erin Morrison and spouse verbalized understanding and had no further questions at this time.

## 2023-07-19 DIAGNOSIS — J9601 Acute respiratory failure with hypoxia: Secondary | ICD-10-CM | POA: Diagnosis not present

## 2023-07-19 DIAGNOSIS — J449 Chronic obstructive pulmonary disease, unspecified: Secondary | ICD-10-CM | POA: Diagnosis not present

## 2023-07-19 DIAGNOSIS — J410 Simple chronic bronchitis: Secondary | ICD-10-CM | POA: Diagnosis not present

## 2023-07-20 ENCOUNTER — Other Ambulatory Visit: Payer: Self-pay | Admitting: Primary Care

## 2023-07-20 DIAGNOSIS — C19 Malignant neoplasm of rectosigmoid junction: Secondary | ICD-10-CM | POA: Diagnosis not present

## 2023-07-20 DIAGNOSIS — I2510 Atherosclerotic heart disease of native coronary artery without angina pectoris: Secondary | ICD-10-CM | POA: Diagnosis not present

## 2023-07-20 DIAGNOSIS — F32A Depression, unspecified: Secondary | ICD-10-CM | POA: Diagnosis not present

## 2023-07-20 DIAGNOSIS — F419 Anxiety disorder, unspecified: Secondary | ICD-10-CM | POA: Diagnosis not present

## 2023-07-20 DIAGNOSIS — D509 Iron deficiency anemia, unspecified: Secondary | ICD-10-CM | POA: Diagnosis not present

## 2023-07-20 DIAGNOSIS — J449 Chronic obstructive pulmonary disease, unspecified: Secondary | ICD-10-CM | POA: Diagnosis not present

## 2023-07-29 ENCOUNTER — Other Ambulatory Visit: Payer: Self-pay | Admitting: Primary Care

## 2023-07-31 DIAGNOSIS — Z08 Encounter for follow-up examination after completed treatment for malignant neoplasm: Secondary | ICD-10-CM | POA: Diagnosis not present

## 2023-07-31 DIAGNOSIS — D123 Benign neoplasm of transverse colon: Secondary | ICD-10-CM | POA: Diagnosis not present

## 2023-07-31 DIAGNOSIS — K6289 Other specified diseases of anus and rectum: Secondary | ICD-10-CM | POA: Diagnosis not present

## 2023-07-31 DIAGNOSIS — K5730 Diverticulosis of large intestine without perforation or abscess without bleeding: Secondary | ICD-10-CM | POA: Diagnosis not present

## 2023-07-31 DIAGNOSIS — Z85038 Personal history of other malignant neoplasm of large intestine: Secondary | ICD-10-CM | POA: Diagnosis not present

## 2023-07-31 DIAGNOSIS — K648 Other hemorrhoids: Secondary | ICD-10-CM | POA: Diagnosis not present

## 2023-07-31 DIAGNOSIS — K6389 Other specified diseases of intestine: Secondary | ICD-10-CM | POA: Diagnosis not present

## 2023-07-31 DIAGNOSIS — K621 Rectal polyp: Secondary | ICD-10-CM | POA: Diagnosis not present

## 2023-07-31 DIAGNOSIS — Z980 Intestinal bypass and anastomosis status: Secondary | ICD-10-CM | POA: Diagnosis not present

## 2023-07-31 DIAGNOSIS — D128 Benign neoplasm of rectum: Secondary | ICD-10-CM | POA: Diagnosis not present

## 2023-08-01 ENCOUNTER — Other Ambulatory Visit: Payer: Self-pay

## 2023-08-02 DIAGNOSIS — K621 Rectal polyp: Secondary | ICD-10-CM | POA: Diagnosis not present

## 2023-08-02 DIAGNOSIS — D123 Benign neoplasm of transverse colon: Secondary | ICD-10-CM | POA: Diagnosis not present

## 2023-08-02 DIAGNOSIS — D128 Benign neoplasm of rectum: Secondary | ICD-10-CM | POA: Diagnosis not present

## 2023-08-09 ENCOUNTER — Other Ambulatory Visit: Payer: Self-pay

## 2023-08-13 ENCOUNTER — Encounter: Payer: Self-pay | Admitting: Gastroenterology

## 2023-08-14 ENCOUNTER — Inpatient Hospital Stay: Attending: Nurse Practitioner

## 2023-08-14 DIAGNOSIS — C20 Malignant neoplasm of rectum: Secondary | ICD-10-CM

## 2023-08-14 DIAGNOSIS — C183 Malignant neoplasm of hepatic flexure: Secondary | ICD-10-CM | POA: Insufficient documentation

## 2023-08-14 DIAGNOSIS — D509 Iron deficiency anemia, unspecified: Secondary | ICD-10-CM | POA: Insufficient documentation

## 2023-08-14 DIAGNOSIS — Z87891 Personal history of nicotine dependence: Secondary | ICD-10-CM | POA: Insufficient documentation

## 2023-08-14 DIAGNOSIS — I10 Essential (primary) hypertension: Secondary | ICD-10-CM | POA: Insufficient documentation

## 2023-08-14 DIAGNOSIS — I2510 Atherosclerotic heart disease of native coronary artery without angina pectoris: Secondary | ICD-10-CM | POA: Insufficient documentation

## 2023-08-14 DIAGNOSIS — E785 Hyperlipidemia, unspecified: Secondary | ICD-10-CM | POA: Diagnosis not present

## 2023-08-14 LAB — CBC WITH DIFFERENTIAL (CANCER CENTER ONLY)
Abs Immature Granulocytes: 0.03 K/uL (ref 0.00–0.07)
Basophils Absolute: 0.1 K/uL (ref 0.0–0.1)
Basophils Relative: 1 %
Eosinophils Absolute: 0.3 K/uL (ref 0.0–0.5)
Eosinophils Relative: 3 %
HCT: 31.9 % — ABNORMAL LOW (ref 36.0–46.0)
Hemoglobin: 9.4 g/dL — ABNORMAL LOW (ref 12.0–15.0)
Immature Granulocytes: 0 %
Lymphocytes Relative: 26 %
Lymphs Abs: 2 K/uL (ref 0.7–4.0)
MCH: 21.3 pg — ABNORMAL LOW (ref 26.0–34.0)
MCHC: 29.5 g/dL — ABNORMAL LOW (ref 30.0–36.0)
MCV: 72.3 fL — ABNORMAL LOW (ref 80.0–100.0)
Monocytes Absolute: 0.6 K/uL (ref 0.1–1.0)
Monocytes Relative: 8 %
Neutro Abs: 4.9 K/uL (ref 1.7–7.7)
Neutrophils Relative %: 62 %
Platelet Count: 332 K/uL (ref 150–400)
RBC: 4.41 MIL/uL (ref 3.87–5.11)
RDW: 18.5 % — ABNORMAL HIGH (ref 11.5–15.5)
WBC Count: 7.8 K/uL (ref 4.0–10.5)
nRBC: 0 % (ref 0.0–0.2)

## 2023-08-14 LAB — CMP (CANCER CENTER ONLY)
ALT: 17 U/L (ref 0–44)
AST: 15 U/L (ref 15–41)
Albumin: 4.2 g/dL (ref 3.5–5.0)
Alkaline Phosphatase: 77 U/L (ref 38–126)
Anion gap: 7 (ref 5–15)
BUN: 17 mg/dL (ref 8–23)
CO2: 29 mmol/L (ref 22–32)
Calcium: 9.5 mg/dL (ref 8.9–10.3)
Chloride: 105 mmol/L (ref 98–111)
Creatinine: 0.62 mg/dL (ref 0.44–1.00)
GFR, Estimated: >60 mL/min (ref >60)
Glucose, Bld: 95 mg/dL (ref 70–99)
Potassium: 4.1 mmol/L (ref 3.5–5.1)
Sodium: 141 mmol/L (ref 135–145)
Total Bilirubin: 0.8 mg/dL (ref 0.0–1.2)
Total Protein: 7.2 g/dL (ref 6.5–8.1)

## 2023-08-14 LAB — FERRITIN: Ferritin: 13 ng/mL (ref 11–307)

## 2023-08-14 LAB — IRON AND IRON BINDING CAPACITY (CC-WL,HP ONLY)
Iron: 19 ug/dL — ABNORMAL LOW (ref 28–170)
Saturation Ratios: 3 % — ABNORMAL LOW (ref 10.4–31.8)
TIBC: 575 ug/dL — ABNORMAL HIGH (ref 250–450)
UIBC: 556 ug/dL — ABNORMAL HIGH (ref 148–442)

## 2023-08-14 LAB — VITAMIN B12: Vitamin B-12: 506 pg/mL (ref 180–914)

## 2023-08-14 LAB — CEA (ACCESS): CEA (CHCC): 6.53 ng/mL — ABNORMAL HIGH (ref 0.00–5.00)

## 2023-08-16 ENCOUNTER — Inpatient Hospital Stay (HOSPITAL_BASED_OUTPATIENT_CLINIC_OR_DEPARTMENT_OTHER): Admitting: Hematology

## 2023-08-16 DIAGNOSIS — C19 Malignant neoplasm of rectosigmoid junction: Secondary | ICD-10-CM

## 2023-08-16 DIAGNOSIS — C20 Malignant neoplasm of rectum: Secondary | ICD-10-CM | POA: Diagnosis not present

## 2023-08-16 NOTE — Assessment & Plan Note (Signed)
 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  - She underwent right hemicolectomy on Jun 13, 2023. - Due to her advanced age and her medical, we decided to surveillance her rectal cancer. - I recommend a ctDNA guardianReveal, she declined

## 2023-08-16 NOTE — Progress Notes (Signed)
 [facility]   [phone] [phone]   Clinic Follow up Note   Patient Care Team: Montey Lot, PA-C as PCP - General (Physician Assistant) Ardis, Evalene CROME, RN as Oncology Nurse Navigator Lanny Callander, MD as Consulting Physician (Hematology and Oncology) Jude Harden GAILS, MD as Consulting Physician (Pulmonary Disease) 08/16/2023  I connected with Erin Morrison on 08/16/23 at  1:00 PM EDT by telephone and verified that I am speaking with the correct person using two identifiers.   I discussed the limitations, risks, security and privacy concerns of performing an evaluation and management service by telephone and the availability of in person appointments. I also discussed with the patient that there may be a patient responsible charge related to this service. The patient expressed understanding and agreed to proceed.   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  - She underwent right hemicolectomy on Jun 13, 2023. - Due to her advanced age and her medical, we decided to surveillance her rectal cancer. - I recommend a ctDNA guardianReveal, she declined    Assessment & Plan Rectal cancer Status post endoscopy resection.  Recent colonoscopy showed no evidence of residual cancer in the rectum. A tattoo and scar were seen, but no recurrence was noted. Given the history of rectal cancer and recent polyp removal, ongoing monitoring is necessary. - Order pelvic MRI in the next few months. - Repeat partial  colonoscopy in six months, likely in early January next year.  Colon cancer Colonoscopy on July 1st showed no signs of recurrence in the colon where surgery was previously performed. Two polyps were removed from the transverse colon, identified as benign tubular adenomas. Diverticulosis was noted, but no residual cancer was found. - Repeat colonoscopy in three years.  Iron deficiency anemia Iron levels have decreased again, with serum iron noted to be low at 13. Hemoglobin is 9.4, which is an improvement but still not normal. She previously stopped oral iron supplementation due to high levels but will restart it now. - Restart oral iron supplementation. - Monitor iron levels and blood counts. - Consider IV iron if oral supplementation does not resolve anemia.  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 Marked right-sided colonic wall thickening with relatively mild surrounding edema. Findings suspicious for colon carcinoma. Relatively focal infectious or ischemic colitis felt less likely. 2. Borderline ileocolic mesenteric adenopathy, suspicious for regional nodal metastasis. No evidence of distant metastasis. 3. Infrarenal abdominal aortic aneurysm with mild extension into the right common iliac. Recommend follow-up ultrasound every 3 years.  (Ref.: J Vasc Surg. 2018; 67:2-77 and J Am Coll Radiol 2013;10(10):789-794.) 4. 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 Differential considerations include liposarcoma, teratoma, or extramedullary hematopoiesis. This can be re-evaluated on follow-up CT at  6 months. 5. Incidental findings, including: Aortic atherosclerosis  (ICD10-I70.0), coronary artery atherosclerosis and emphysema (ICD10-J43.9). Fat containing ventral abdominopelvic and right inguinal hernias   04/20/2023 Procedure   Endoscopy  Normal esophagus with a widely patent Schatzki ring.  There is a 1 cm hiatal hernia.  She has erosive gastropathy and a normal duodenum.   --Biopsies taken from the duodenum show chronic peptic duodenitis, negative for dysplasia.  Biopsies from the stomach show chronic, inactive gastritis.  Biopsies were  negative for H. pylori and negative for dysplasia.     04/20/2023 Procedure   Colonoscopy  Malignant, partially obstructing tumor at the hepatic flexure.  There were three 6 to 9 mm polyps in the transverse colon.  A 5 cm polyp in the rectum (15 cm from the anus) was removed piecemeal.  It was resected and retrieved.  A 2 cm polyp in the rectum (10 cm from the anus) was removed piecemeal.  It was resected and retrieved.  There were nonbleeding, internal hemorrhoids and diverticulosis in the sigmoid colon.   --Biopsy of the tumor at hepatic flexure was positive for invasive adenocarcinoma, moderately differentiated.  MMR is abnormal.   --Biopsies of the polyps from transverse colon showed tubular adenoma, negative for high-grade dysplasia.   --Biopsy of 5 cm polyp in the rectum, 15 cm from the anus, showed invasive adenocarcinoma, moderately differentiated.  It was arising from tubular adenoma with high-grade dysplasia.  MMR protein is preserved.   --Biopsy of 2 cm polyp, 10 cm from the anus, showed tubular adenoma, negative for high-grade dysplasia or invasive carcinoma.   04/20/2023 Pathology Results   --Biopsy of the tumor at hepatic flexure was positive for invasive adenocarcinoma, moderately differentiated.  MMR is abnormal.   --Biopsies of the polyps from transverse colon showed tubular adenoma, negative for high-grade dysplasia.   --Biopsy of 5 cm polyp in the rectum, 15 cm from the anus, showed invasive adenocarcinoma,  moderately differentiated.  It was arising from tubular adenoma with high-grade dysplasia.  MMR protein is preserved.   --Biopsy of 2 cm polyp, 10 cm from the anus, showed tubular adenoma, negative for high-grade dysplasia or invasive carcinoma.   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  History of Present Illness Erin Morrison is a 73 year old female with colon and rectal cancer who presents for follow-up. She is accompanied by her husband.  Since her last visit, she has gained approximately eight pounds due to an increased appetite. She reports no bowel movement issues or bleeding.  A colonoscopy on July 1st revealed two benign tubular adenomas in the transverse colon. The rectum showed a tattoo and scar, and diverticulosis was diagnosed.  Her iron levels have decreased, necessitating a restart of oral iron supplements. Her blood count is 9.4, indicating improvement but persistent iron deficiency. She tolerates oral iron supplementation well.  Recent labs show normal kidney and liver function. Her tumor marker is  lower than before but remains slightly elevated.     REVIEW OF SYSTEMS:   Constitutional: Denies fevers, chills or abnormal weight loss Eyes: Denies blurriness of vision Ears, nose, mouth, throat, and face: Denies mucositis or sore throat Respiratory: Denies cough, dyspnea or wheezes Cardiovascular: Denies  palpitation, chest discomfort or lower extremity swelling Gastrointestinal:  Denies nausea, heartburn or change in bowel habits Skin: Denies abnormal skin rashes Lymphatics: Denies new lymphadenopathy or easy bruising Neurological:Denies numbness, tingling or new weaknesses Behavioral/Psych: Mood is stable, no new changes  All other systems were reviewed with the patient and are negative.  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   Seen on last chest x-ray 12/2015, not on x-ray from 12/2015     Essential hypertension 06/08/2015   Former smoker 02/24/2022   GERD (gastroesophageal reflux disease) 10/24/2016   H/O steroid therapy 06/08/2015   Hyperlipidemia 12/06/2015   Hypertension    Low back pain    Lower back pain 04/08/2018   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 Denies   On home oxygen  therapy    Pneumonia    2012   Pre-operative respiratory examination 08/31/2020   Umbilical hernia    UTI (urinary tract infection) 04/10/2018    SURGICAL HISTORY: Past Surgical History:  Procedure Laterality Date   arthroscopic knee surgery Right    CARDIAC  CATHETERIZATION N/A 03/03/2016   Procedure: Right/Left Heart Cath and Coronary Angiography;  Surgeon: Victory LELON Sharps, MD;  Location: [facility] INVASIVE CV LAB;  Service: Cardiovascular;  Laterality: N/A;   COLON RESECTION Right 06/13/2023   Procedure: COLECTOMY, HAND-ASSISTED, LAPAROSCOPIC;  Surgeon: Teresa Lonni HERO, MD;  Location: [facility] ORS;  Service: General;  Laterality: Right;  LAPAROSCOPIC RIGHT HEMICOLECTOMY   COLONOSCOPY N/A 04/20/2023   Procedure: COLONOSCOPY;  Surgeon: Saintclair Jasper, MD;  Location: [facility] ENDOSCOPY;  Service: Gastroenterology;  Laterality: N/A;   ESOPHAGOGASTRODUODENOSCOPY N/A 04/20/2023   Procedure: EGD (ESOPHAGOGASTRODUODENOSCOPY);  Surgeon: Saintclair Jasper, MD;  Location: [facility] ENDOSCOPY;  Service: Gastroenterology;  Laterality: N/A;   HEMOSTASIS CLIP PLACEMENT  04/20/2023   Procedure: CONTROL OF HEMORRHAGE, GI TRACT, ENDOSCOPIC, BY CLIPPING OR OVERSEWING;  Surgeon: Saintclair Jasper, MD;  Location: [facility] ENDOSCOPY;  Service: Gastroenterology;;   POLYPECTOMY  04/20/2023   Procedure: POLYPECTOMY;  Surgeon: Saintclair Jasper, MD;  Location: [facility] ENDOSCOPY;  Service: Gastroenterology;;   TUBAL LIGATION     VENTRAL HERNIA REPAIR N/A 06/13/2023   Procedure: REPAIR, HERNIA, VENTRAL;  Surgeon: Teresa Lonni HERO, MD;  Location: [facility] ORS;  Service: General;  Laterality: N/A;  VENTRAL HERNIA REPAIR  I have reviewed the social history and family history with the patient and they are unchanged from previous note.  ALLERGIES:  has no known allergies.  MEDICATIONS:  Current Outpatient Medications  Medication Sig Dispense Refill   acetaminophen  (TYLENOL ) 500 MG tablet Take 1,000 mg by mouth 2 (two) times daily as needed for moderate pain (pain score 4-6) or headache.     albuterol  (PROVENTIL ) (2.5 MG/3ML) 0.083% nebulizer solution Take 2.5 mg by nebulization every 6 (six) hours as needed for wheezing or shortness of breath.     albuterol  (VENTOLIN  HFA) 108 (90 Base) MCG/ACT inhaler TAKE 2 PUFFS BY MOUTH EVERY 6 HOURS  AS NEEDED FOR WHEEZE OR SHORTNESS OF BREATH 6.7 each 3   atorvastatin  (LIPITOR) 40 MG tablet TAKE 1 TABLET BY MOUTH EVERY DAY AT 6PM 15 tablet 0   Budeson-Glycopyrrol-Formoterol  (BREZTRI  AEROSPHERE) 160-9-4.8 MCG/ACT AERO Inhale 2 puffs into the lungs in the morning and at bedtime. 10.7 each 11   escitalopram  (LEXAPRO ) 10 MG tablet Take 10 mg by mouth daily.     Ferrous Sulfate  (IRON PO) Take 25 mg by mouth daily.     methocarbamol  (ROBAXIN ) 500 MG tablet Take 1 tablet (500 mg total) by mouth 4 (four) times daily. 30 tablet 0   montelukast  (SINGULAIR ) 10 MG tablet TAKE 1 TABLET BY MOUTH EVERYDAY AT BEDTIME 90 tablet 3   roflumilast  (DALIRESP ) 500 MCG TABS tablet Take 1 tablet (500 mcg total) by mouth daily. 90 tablet 3   traMADol  (ULTRAM ) 50 MG tablet Take 1 tablet (50 mg total) by mouth every 6 (six) hours as needed for severe pain (pain score 7-10). 20 tablet 0   No current facility-administered medications for this visit.    PHYSICAL EXAMINATION: Not performed   LABORATORY DATA:  I have reviewed the data as listed    Latest Ref Rng & Units 08/14/2023    1:29 PM 06/29/2023    9:53 AM 06/16/2023    5:23 AM  CBC  WBC 4.0 - 10.5 K/uL 7.8  9.8  12.3   Hemoglobin 12.0 - 15.0 g/dL 9.4  9.3  8.4   Hematocrit 36.0 - 46.0 % 31.9  32.4  29.9   Platelets 150 - 400 K/uL 332  398  498         Latest Ref Rng & Units 08/14/2023    1:29 PM 06/29/2023    9:53 AM 06/16/2023    5:23 AM  CMP  Glucose 70 - 99 mg/dL 95  897  93   BUN 8 - 23 mg/dL 17  16  8    Creatinine 0.44 - 1.00 mg/dL 9.37  9.35  9.47   Sodium 135 - 145 mmol/L 141  140  137   Potassium 3.5 - 5.1 mmol/L 4.1  3.9  3.6   Chloride 98 - 111 mmol/L 105  103  101   CO2 22 - 32 mmol/L 29  29  27    Calcium  8.9 - 10.3 mg/dL 9.5  9.8  9.2   Total Protein 6.5 - 8.1 g/dL 7.2  7.3    Total Bilirubin 0.0 - 1.2 mg/dL 0.8  0.6    Alkaline Phos 38 - 126 U/L 77  83    AST 15 - 41 U/L 15  15    ALT 0 - 44 U/L 17  14        RADIOGRAPHIC  STUDIES: I have personally reviewed the radiological images as listed and agreed with the findings in the report. No  results found.     I discussed the assessment and treatment plan with the patient. The patient was provided an opportunity to ask questions and all were answered. The patient agreed with the plan and demonstrated an understanding of the instructions.   The patient was advised to call back or seek an in-person evaluation if the symptoms worsen or if the condition fails to improve as anticipated.  I provided 25 minutes of non face-to-face telephone visit time during this encounter, including review of chart and various tests results, discussions about plan of care and coordination of care plan.    Onita Mattock, MD 08/16/23

## 2023-08-18 DIAGNOSIS — J410 Simple chronic bronchitis: Secondary | ICD-10-CM | POA: Diagnosis not present

## 2023-08-18 DIAGNOSIS — J9601 Acute respiratory failure with hypoxia: Secondary | ICD-10-CM | POA: Diagnosis not present

## 2023-08-18 DIAGNOSIS — J449 Chronic obstructive pulmonary disease, unspecified: Secondary | ICD-10-CM | POA: Diagnosis not present

## 2023-08-20 ENCOUNTER — Telehealth: Payer: Self-pay | Admitting: Hematology

## 2023-08-20 NOTE — Telephone Encounter (Signed)
 Rescheduled appointments per room/resource. Talked with the patients spouse and he is aware of the changes made to the patients upcoming appointments.

## 2023-09-18 DIAGNOSIS — J410 Simple chronic bronchitis: Secondary | ICD-10-CM | POA: Diagnosis not present

## 2023-09-18 DIAGNOSIS — J449 Chronic obstructive pulmonary disease, unspecified: Secondary | ICD-10-CM | POA: Diagnosis not present

## 2023-09-18 DIAGNOSIS — J9601 Acute respiratory failure with hypoxia: Secondary | ICD-10-CM | POA: Diagnosis not present

## 2023-09-20 ENCOUNTER — Ambulatory Visit: Admitting: Primary Care

## 2023-09-20 ENCOUNTER — Encounter: Payer: Self-pay | Admitting: Primary Care

## 2023-09-20 VITALS — BP 132/74 | HR 63 | Temp 97.4°F | Ht 62.0 in | Wt 154.4 lb

## 2023-09-20 DIAGNOSIS — J986 Disorders of diaphragm: Secondary | ICD-10-CM

## 2023-09-20 DIAGNOSIS — Z23 Encounter for immunization: Secondary | ICD-10-CM

## 2023-09-20 DIAGNOSIS — J9611 Chronic respiratory failure with hypoxia: Secondary | ICD-10-CM | POA: Diagnosis not present

## 2023-09-20 DIAGNOSIS — J449 Chronic obstructive pulmonary disease, unspecified: Secondary | ICD-10-CM

## 2023-09-20 MED ORDER — BREZTRI AEROSPHERE 160-9-4.8 MCG/ACT IN AERO: 2.0000 | INHALATION_SPRAY | Freq: Two times a day (BID) | RESPIRATORY_TRACT | 11 refills | Status: AC

## 2023-09-20 MED ORDER — ROFLUMILAST 500 MCG PO TABS: 500.0000 ug | ORAL_TABLET | Freq: Every day | ORAL | 3 refills | Status: AC

## 2023-09-20 MED ORDER — MONTELUKAST SODIUM 10 MG PO TABS: 10.0000 mg | ORAL_TABLET | Freq: Every day | ORAL | 3 refills | Status: AC

## 2023-09-20 NOTE — Patient Instructions (Signed)
  VISIT SUMMARY: Today, you came in for your six-month follow-up appointment to manage your COPD. You reported that your current medication regimen is effective, and you have not experienced any exacerbations or hospitalizations. You also mentioned that you have not needed to use your albuterol  inhaler recently. Additionally, we discussed your recent hernia repair and upcoming MRI. We reviewed your immunization history and identified some vaccines that are due.  YOUR PLAN: -CHRONIC OBSTRUCTIVE PULMONARY DISEASE (COPD): COPD is a chronic lung condition that makes it hard to breathe. Your COPD is well-managed with your current medications: Breztri , Daliresp , Singulair , and albuterol  as needed. You should continue with these medications and maintain your current oxygen  therapy at 2 liters. We will schedule a follow-up in six months unless your symptoms worsen. Staying active is encouraged.  -PNEUMOCOCCAL VACCINATION (PREVNAR 20): You are due for the Prevnar 20 vaccine, which protects against pneumococcal disease. You received the pneumococcal 23 vaccine in 2013 and the Prevnar 13 vaccine in 2015. You will receive the Prevnar 20 vaccine today, and no additional pneumococcal vaccinations will be needed after this.  -INFLUENZA VACCINATION: The flu vaccine is recommended to protect against the influenza virus, especially important for the upcoming flu season. You should get the flu shot starting September 1st. Please contact your pharmacy to receive the influenza vaccine.  -RSV VACCINATION: The RSV vaccine is recommended because you are over 65 and have COPD. RSV can cause severe respiratory infections. You should get a one-time RSV vaccine. Please contact your pharmacy to receive the RSV vaccine.  INSTRUCTIONS: Please schedule a follow-up appointment in six months unless your symptoms worsen. Contact your pharmacy to receive the influenza and RSV vaccines. Your next MRI is scheduled for September  10th.  Follow-up 6 months with Beth or Dr. Jude

## 2023-09-20 NOTE — Progress Notes (Signed)
 @Patient  ID: Erin Morrison, female    DOB: [birth year], 73 y.o.   MRN: [id number]  Chief Complaint  Patient presents with   COPD    Referring provider: Conroy, Nathan, PA-C  HPI: 73 year old female, former smoker 1.5-2ppd x 30+ years (quit 2012). PMH moderate COPD, chronic allergic rhinitis, elevated diaphragm, chronic resp failure, GERD, HTN, CAD, NSTEMI. Patient of Dr. Jude. She is maintained on Breztri  Aerosphere and Daliresp  .   Previous [place] encounter: 7/30/2021GLENWOOD Ferrari, NP Patient presents today for 6 month follow-up. Doing fairly well, no recent exacerbations in the last 6 months or hospital stays. She is currently taking STIOLTO and Pulmicort . Husband reports this cost them $300 dollars a month. Recently change insurance plans. They received a letter stating that Daliresp  will not be covered by [facility] medicare advantage. She has two weeks left of Daliresp  medication. She has noticed a big improvement in COPD symptoms since starting medication and she has had a decrease in COPD exacerbations and hospitalizations since being on it.   03/25/20- Dr. Jude She is on Stiolto 2 puffs daily + Pulmicort  nebulizer twice daily and Daliresp . Prednisone  was tapered to off She continues to complain of significant shortness of breath.  She has a sedentary lifestyle, she is short of breath walking around the house.  She is using oxygen  24/7 She has gained 20 pounds from 190 in 2019 to her current weight of 210 pounds  Denies pedal edema, orthopnea or proximal nocturnal dyspnea.  No hospitalization in the past year   Oxygen  saturation dropped to 87% on first lab and recovered to 92% on 2 L pulse   07/23/2020 Patient presents today for 4 month follow-up. She is at her baseline, no acute respiratory complaints today. No significant changes since last visit. She continues to experience moderate dyspnea with activity. She is using stationary bike and walking. She is also working on  weight loss. She is currently on Stiolto + Pulmicort  but is wondering if she should try Breztri  that Dr. Jude had previously recommended. She continues to take Daliresp . She is on 2-4L oxygen  24/7; she has had no new O2 requirements. She is having consult for hernia repair in the upcoming week. Denies cough, chest congestion, chest tightness or wheezing. CAT score 13.  11/23/20- 57-month follow-up visit She used to be on a regimen of Stiolto 2 puffs daily + Pulmicort  nebulizer twice daily and Daliresp . Prednisone  was tapered to off She was switched to Breztri  and this seems to work better for her, she needs refills as she is out of her medication She arrives accompanied by her husband who corroborates history, on oxygen  POC. Previous evaluation 06/2020 noted as preop evaluation for ventral hernia.  Surgeon was Dr. Rubin, he has decided against proceeding with surgery She admits to sedentary lifestyle, was using stationary bike but has stopped doing this Blood pressure documented low on left arm   05/24/2021 Patient presents today for 6 month follow-up. Dyspnea is worse. No acute cough. Some wheezing. She had an exacerbation in February 2023 which was treated with prednisone  and never fully recovered. She has not been consistently exercising at home, reluctant to start pulmonary rehab. She is using Albuterol  rescue inhaler 1-2 times a day. She remains on 3L supplemental oxygen    07/15/21- Dr. Jude 73 yo ex-smoker for follow-up of moderate COPD and chronic respiratory failure on O2 since 2017 smoker 1.5-2ppd x 30+ years (quit 2012 ) -dyspnea & hypoxia out of proportion  to her  lung function    PMH -noncritical CAD  She used to be on a regimen of Stiolto 2 puffs daily + Pulmicort  nebulizer twice daily and Daliresp  before switching to Breztri .  Ventral hernia surgery was felt to be high risk  10/07/21- Dr. Neysa - Acute visit - 71 yoF last seen by Dr Jude for moderate COPD 07/15/21 -Neb albuterol ,  Albuterol  hfa, Dymista , Breztri , Singulair , Daliresp , Aerochamber O2 3L 24/7- has POC from [facility] Pt called 9/5 c/o productive cough, yellow, wheeze, incr SOB x 2 weeks. CXR 05/25/21- IMPRESSION: Stable emphysema pattern. No superimposed acute process by plain radiography Aortic Atherosclerosis (ICD10-I70.0). PFT 07/15/21-Moderate obstruction, no response to dilator- emphysema pattern. -----States increase sob, wheezing, productive cough with clear to yellow sputum Took prednisone  in June. She reports gradually increasing dyspnea, productive cough w/o sore throat/ fever/ chills/GI/GU over past 2 weeks. Staying in home. No recognized sick exposure or acute event. She asks prednisone  and indicates an antibiotic like Zpak usually helps her.  Lab tech is out today.  10/24/2021 Patient presents today for follow-up. Seen earlier this month by Dr. Neysa for COPD exacerbation treated with Z-Pak and prednisone  taper. Ordered for CBC with dif and IgE, not done. She is feeling back to her baseline. No longer has productive cough or wheezing. She has occasional dyspnea symptom which do not occur daily. She is complaint with Breztri , Singulair  and daliresp . She wears 3L oxygen  24/7. CAT score 7.    02/24/2022  Patient presents today for 3-4 month follow-up. Hx moderate COPD/emphysema.  She is doing extremely well today without acute complaints. No recent hospitalizations or exacerbations. No recent abx or prednisone . Maintained on Breztri , Daliresp , Singulair  and prn albuterol . Wearing 2L pulsed oxygen  with exertion and at bedtime. Needs medication refilled today.   Started smoking age 62, quit age 13. She smoked on average 1.5 ppd. She is not intersted in lung cancer screening at this time.   06/29/22- Dr. Jude  63-month follow-up visit. Accompanied by her husband today who corroborates history.  Last office visit with me was 06/2021 and we gave her a longer course of prednisone . She had a follow-up visit  with APP in 01/2022 which I reviewed She has lost 20 pounds.  Breathing is stable. She remains on 2 L pulse     01/01/2023- Interim hx  Discussed the use of AI scribe software for clinical note transcription with the patient, who gave verbal consent to proceed.  History of Present Illness   The patient, followed for COPD and emphysema, reports no significant change in her breathing since the last visit six months ago. She continues to use Breztri  inhaler twice daily, Daliresp , and Singulair . She uses an Albuterol  rescue inhaler once or twice a week. She denies any recent flare-ups of her breathing or need for antibiotics since the last visit.  The patient has been on oxygen  therapy, using two to three liters as needed. She quit smoking at the age of 54. She also reports a significant weight loss, which she attributes to changes in her eating habits. She eats until she is full and then stops, without following a specific diet.  The patient had her flu shot and COVID booster in November. She has not yet received the one-time RSV vaccine but expresses interest in getting it. She has not been on prednisone  recently. She requests refills for her current medications.       09/20/2023- Interim hx  Discussed the use of AI scribe software for clinical note transcription  with the patient, who gave verbal consent to proceed.  History of Present Illness Mckena K Orton is a 73 year old female with COPD who presents for a six-month follow-up.  She is on a medication regimen that includes Breztri , Daliresp , Singulair , and albuterol  as needed, which she finds effective. She has not experienced any COPD exacerbations, hospitalizations, or increased oxygen  requirements. She uses two liters of oxygen , primarily when sitting, and has not noticed any low oxygen  readings. She has a portable concentrator and regularly monitors her oxygen  levels.  She underwent hernia repair in May without complications.  Additionally, she had a hemicolectomy and two colonoscopies, along with two CT scans, and is scheduled for an MRI on September 10th.  In terms of respiratory symptoms, she reports no shortness of breath, daily cough, or wheezing. She has not needed to use her albuterol  inhaler recently.  Her immunization history includes receiving the pneumococcal vaccine in 2013 and 2015. She has not received the RSV vaccine.  Significant tests/ events reviewed   03/2020 Oxygen  saturation dropped to 87% on first lap and recovered to 92% on 2 L pulse   HRCT 11/2017 moderate emphysema, mild scarring at lung bases, postinflammatory, no ILD   PFTs 06/2021 moderate airway obstruction, ratio 60, FEV1 61%, FVC 76%, no bronchodilator response, TLC 128% consistent with hyperinflation, DLCO 11.2/60%   PFTs 03/2020 ratio 63, FEV1 65%, TLC 100%, DLCO 83% -Stable compared to 2018   PFT 03/2016: FVC 2.28 L (80%),FEV1 1.49 L (69%), ratio 66 sig bronchodilator response NO DLCO 07/2015: FVC 2.34 L (82%) FEV1 1.46 L (67%) ratio 63 sig bronchodilator response DLCO uncorrected 62%   Alpha-1 antitrypsin: MM (169)  No Known Allergies  Immunization History  Administered Date(s) Administered   Fluad Quad(high Dose 65+) 11/25/2018, 10/29/2020, 10/24/2021   Fluad Trivalent(High Dose 65+) 12/18/2022   Influenza Split 10/18/2015   Influenza, High Dose Seasonal PF 10/24/2016   Influenza,inj,Quad PF,6+ Mos 09/19/2017   PFIZER(Purple Top)SARS-COV-2 Vaccination 08/26/2019, 09/16/2019   Pfizer(Comirnaty)Fall Seasonal Vaccine 12 years and older 12/18/2022   Pneumococcal Conjugate-13 01/30/2013   Pneumococcal Polysaccharide-23 02/27/2011    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/25/2023   Chest pain 01/13/2016   Chronic allergic rhinitis 01/13/2016    Chronic respiratory failure with hypoxia (HCC) 05/03/2017   COPD (chronic obstructive pulmonary disease) (HCC)    COPD exacerbation (HCC) 12/03/2015   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   Seen on last chest x-ray 12/2015, not on x-ray from 12/2015     Essential hypertension 06/08/2015   Former smoker 02/24/2022   GERD (gastroesophageal reflux disease) 10/24/2016   H/O steroid therapy 06/08/2015   Hyperlipidemia 12/06/2015   Hypertension    Low back pain    Lower back pain 04/08/2018   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 Denies   On  home oxygen  therapy    Pneumonia    2012   Pre-operative respiratory examination 08/31/2020   Umbilical hernia    UTI (urinary tract infection) 04/10/2018    Tobacco History: Social History   Tobacco Use  Smoking Status Former   Current packs/day: 0.00   Average packs/day: 1.5 packs/day for 46.0 years (69.0 ttl pk-yrs)   Types: Cigarettes   Start date: 01/31/1964   Quit date: 01/30/2010   Years since quitting: 13.6  Smokeless Tobacco Never   Counseling given: Not Answered   Outpatient Medications Prior to Visit  Medication Sig Dispense Refill   acetaminophen  (TYLENOL ) 500 MG tablet Take 1,000 mg by mouth 2 (two) times daily as needed for moderate pain (pain score 4-6) or headache.     albuterol  (PROVENTIL ) (2.5 MG/3ML) 0.083% nebulizer solution Take 2.5 mg by nebulization every 6 (six) hours as needed for wheezing or shortness of breath.     albuterol  (VENTOLIN  HFA) 108 (90 Base) MCG/ACT inhaler TAKE 2 PUFFS BY MOUTH EVERY 6 HOURS AS NEEDED FOR WHEEZE OR  SHORTNESS OF BREATH 6.7 each 3   atorvastatin  (LIPITOR) 40 MG tablet TAKE 1 TABLET BY MOUTH EVERY DAY AT 6PM 15 tablet 0   Budeson-Glycopyrrol-Formoterol  (BREZTRI  AEROSPHERE) 160-9-4.8 MCG/ACT AERO Inhale 2 puffs into the lungs in the morning and at bedtime. 10.7 each 11   cyanocobalamin  (VITAMIN B12) 500 MCG tablet 2 tablet Orally Once a day     escitalopram  (LEXAPRO ) 10 MG tablet Take 10 mg by mouth daily.     Ferrous Sulfate  (IRON PO) Take 25 mg by mouth daily.     methocarbamol  (ROBAXIN ) 500 MG tablet Take 1 tablet (500 mg total) by mouth 4 (four) times daily. 30 tablet 0   montelukast  (SINGULAIR ) 10 MG tablet TAKE 1 TABLET BY MOUTH EVERYDAY AT BEDTIME 90 tablet 3   roflumilast  (DALIRESP ) 500 MCG TABS tablet Take 1 tablet (500 mcg total) by mouth daily. 90 tablet 3   traMADol  (ULTRAM ) 50 MG tablet Take 1 tablet (50 mg total) by mouth every 6 (six) hours as needed for severe pain (pain score 7-10). 20 tablet 0   No facility-administered medications prior to visit.      Review of Systems  Review of Systems  Constitutional: Negative.   Respiratory: Negative.  Negative for cough, shortness of breath and wheezing.    Physical Exam  BP 132/74   Pulse 63   Temp (!) 97.4 F (36.3 C)   Ht 5' 2 (1.575 m)   Wt 154 lb 6.4 oz (70 kg)   SpO2 94% Comment: 2L O2 POC  BMI 28.24 kg/m  Physical Exam Constitutional:      Appearance: Normal appearance.  Cardiovascular:     Rate and Rhythm: Normal rate and regular rhythm.  Pulmonary:     Effort: Pulmonary effort is normal.     Breath sounds: Normal breath sounds. No wheezing, rhonchi or rales.     Comments: 2L POC Musculoskeletal:        General: Normal range of motion.  Skin:    General: Skin is warm and dry.  Neurological:     General: No focal deficit present.     Mental Status: She is alert and oriented to person, place, and time. Mental status is at baseline.  Psychiatric:        Mood and Affect: Mood normal.        Behavior:  Behavior normal.        Thought Content: Thought content  normal.        Judgment: Judgment normal.       Lab Results:  CBC    Component Value Date/Time   WBC 7.8 08/14/2023 1329   WBC 12.3 (H) 06/16/2023 0523   RBC 4.41 08/14/2023 1329   HGB 9.4 (L) 08/14/2023 1329   HGB 14.0 04/18/2017 1048   HCT 31.9 (L) 08/14/2023 1329   HCT 42.0 04/18/2017 1048   PLT 332 08/14/2023 1329   PLT 319 04/18/2017 1048   MCV 72.3 (L) 08/14/2023 1329   MCV 90 04/18/2017 1048   MCV 94 02/26/2011 0424   MCH 21.3 (L) 08/14/2023 1329   MCHC 29.5 (L) 08/14/2023 1329   RDW 18.5 (H) 08/14/2023 1329   RDW 13.1 04/18/2017 1048   RDW 13.5 02/26/2011 0424   LYMPHSABS 2.0 08/14/2023 1329   LYMPHSABS 1.6 02/24/2016 1230   LYMPHSABS 0.6 (L) 02/26/2011 0424   MONOABS 0.6 08/14/2023 1329   MONOABS 0.1 02/26/2011 0424   EOSABS 0.3 08/14/2023 1329   EOSABS 0.1 02/24/2016 1230   EOSABS 0.0 02/26/2011 0424   BASOSABS 0.1 08/14/2023 1329   BASOSABS 0.0 02/24/2016 1230   BASOSABS 0.0 02/26/2011 0424    BMET    Component Value Date/Time   NA 141 08/14/2023 1329   NA 141 04/18/2017 1048   NA 146 (H) 02/26/2011 0424   K 4.1 08/14/2023 1329   K 3.7 02/26/2011 0424   CL 105 08/14/2023 1329   CL 112 (H) 02/26/2011 0424   CO2 29 08/14/2023 1329   CO2 23 02/26/2011 0424   GLUCOSE 95 08/14/2023 1329   GLUCOSE 137 (H) 02/26/2011 0424   BUN 17 08/14/2023 1329   BUN 13 04/18/2017 1048   BUN 12 02/26/2011 0424   CREATININE 0.62 08/14/2023 1329   CREATININE 0.58 (L) 02/26/2011 0424   CALCIUM  9.5 08/14/2023 1329   CALCIUM  9.3 02/26/2011 0424   GFRNONAA >60 08/14/2023 1329   GFRNONAA >60 02/26/2011 0424   GFRAA 103 04/18/2017 1048   GFRAA >60 02/26/2011 0424    BNP No results found for: BNP  ProBNP    Component Value Date/Time   PROBNP 45 04/18/2017 1048    Imaging: No results found.   Assessment & Plan:   No problem-specific Assessment & Plan notes found for this encounter.   1.  Moderate COPD (chronic obstructive pulmonary disease) (HCC) (Primary)  2. Elevated diaphragm  3. Chronic respiratory failure with hypoxia (HCC)  4. Need for vaccination against Streptococcus pneumoniae  5. Immunization due - Pneumococcal conjugate vaccine 20-valent (Prevnar-20)   Assessment and Plan Assessment & Plan Chronic obstructive pulmonary disease (COPD) Well-managed COPD with no recent exacerbations or hospitalizations. Current regimen includes Breztri , Daliresp , Singulair , and as-needed albuterol . No SABA use. No increase in oxygen  requirements, currently using 2 liters of oxygen . No daily cough, wheezing, or shortness of breath with activities.  - Continue Breztri , Daliresp , and Singulair  - Maintain current oxygen  therapy at 2 liters - Ensure refills for Breztri , Daliresp , and Singulair  - Schedule follow-up in six months unless symptoms worsen - Encourage staying active  Pneumococcal vaccination (Prevnar 20) due Due for Prevnar 20 vaccination as she received pneumococcal 23 in 2013 and Prevnar 13 in 2015. No need for additional pneumococcal vaccination after Prevnar 20. - Administer Prevnar 20 vaccine today  Influenza vaccination recommended Influenza vaccination recommended for the upcoming season. She has not yet received the flu shot this year. - Advise to receive influenza vaccine starting after September 1st - Instruct to  contact pharmacy for influenza vaccine  RSV vaccination recommended RSV vaccine recommended due to age over 46 and underlying COPD. RSV can cause severe respiratory infections in vulnerable populations, potentially leading to hospitalizations or respiratory failure. - Recommend one-time RSV vaccine - Instruct to contact pharmacy for RSV vaccine   Almarie LELON Ferrari, NP 09/20/2023

## 2023-10-03 ENCOUNTER — Other Ambulatory Visit

## 2023-10-03 ENCOUNTER — Ambulatory Visit: Admitting: Hematology

## 2023-10-10 ENCOUNTER — Ambulatory Visit (HOSPITAL_COMMUNITY)
Admission: RE | Admit: 2023-10-10 | Discharge: 2023-10-10 | Disposition: A | Discharge status: Home / Self Care | Source: Ambulatory Visit | Attending: Hematology | Admitting: Hematology

## 2023-10-10 ENCOUNTER — Inpatient Hospital Stay: Attending: Nurse Practitioner

## 2023-10-10 DIAGNOSIS — R635 Abnormal weight gain: Secondary | ICD-10-CM | POA: Diagnosis not present

## 2023-10-10 DIAGNOSIS — D509 Iron deficiency anemia, unspecified: Secondary | ICD-10-CM | POA: Insufficient documentation

## 2023-10-10 DIAGNOSIS — R935 Abnormal findings on diagnostic imaging of other abdominal regions, including retroperitoneum: Secondary | ICD-10-CM | POA: Diagnosis not present

## 2023-10-10 DIAGNOSIS — C183 Malignant neoplasm of hepatic flexure: Secondary | ICD-10-CM | POA: Diagnosis not present

## 2023-10-10 DIAGNOSIS — C20 Malignant neoplasm of rectum: Secondary | ICD-10-CM | POA: Diagnosis not present

## 2023-10-10 DIAGNOSIS — I2510 Atherosclerotic heart disease of native coronary artery without angina pectoris: Secondary | ICD-10-CM | POA: Diagnosis not present

## 2023-10-10 DIAGNOSIS — Z87891 Personal history of nicotine dependence: Secondary | ICD-10-CM | POA: Diagnosis not present

## 2023-10-10 DIAGNOSIS — E785 Hyperlipidemia, unspecified: Secondary | ICD-10-CM | POA: Diagnosis not present

## 2023-10-10 DIAGNOSIS — K5730 Diverticulosis of large intestine without perforation or abscess without bleeding: Secondary | ICD-10-CM | POA: Diagnosis not present

## 2023-10-10 DIAGNOSIS — I10 Essential (primary) hypertension: Secondary | ICD-10-CM | POA: Diagnosis not present

## 2023-10-10 LAB — CMP (CANCER CENTER ONLY)
ALT: 15 U/L (ref 0–44)
AST: 14 U/L — ABNORMAL LOW (ref 15–41)
Albumin: 4.4 g/dL (ref 3.5–5.0)
Alkaline Phosphatase: 72 U/L (ref 38–126)
Anion gap: 5 (ref 5–15)
BUN: 14 mg/dL (ref 8–23)
CO2: 32 mmol/L (ref 22–32)
Calcium: 9.7 mg/dL (ref 8.9–10.3)
Chloride: 104 mmol/L (ref 98–111)
Creatinine: 0.61 mg/dL (ref 0.44–1.00)
GFR, Estimated: >60 mL/min (ref >60)
Glucose, Bld: 82 mg/dL (ref 70–99)
Potassium: 4.2 mmol/L (ref 3.5–5.1)
Sodium: 141 mmol/L (ref 135–145)
Total Bilirubin: 0.8 mg/dL (ref 0.0–1.2)
Total Protein: 7.3 g/dL (ref 6.5–8.1)

## 2023-10-10 LAB — IRON AND IRON BINDING CAPACITY (CC-WL,HP ONLY)
Iron: 52 ug/dL (ref 28–170)
Saturation Ratios: 9 % — ABNORMAL LOW (ref 10.4–31.8)
TIBC: 561 ug/dL — ABNORMAL HIGH (ref 250–450)
UIBC: 509 ug/dL — ABNORMAL HIGH (ref 148–442)

## 2023-10-10 LAB — CBC WITH DIFFERENTIAL (CANCER CENTER ONLY)
Abs Immature Granulocytes: 0.02 K/uL (ref 0.00–0.07)
Basophils Absolute: 0 K/uL (ref 0.0–0.1)
Basophils Relative: 1 %
Eosinophils Absolute: 0.3 K/uL (ref 0.0–0.5)
Eosinophils Relative: 4 %
HCT: 38.6 % (ref 36.0–46.0)
Hemoglobin: 11.8 g/dL — ABNORMAL LOW (ref 12.0–15.0)
Immature Granulocytes: 0 %
Lymphocytes Relative: 29 %
Lymphs Abs: 2.1 K/uL (ref 0.7–4.0)
MCH: 24.8 pg — ABNORMAL LOW (ref 26.0–34.0)
MCHC: 30.6 g/dL (ref 30.0–36.0)
MCV: 81.3 fL (ref 80.0–100.0)
Monocytes Absolute: 0.6 K/uL (ref 0.1–1.0)
Monocytes Relative: 8 %
Neutro Abs: 4.1 K/uL (ref 1.7–7.7)
Neutrophils Relative %: 58 %
Platelet Count: 255 K/uL (ref 150–400)
RBC: 4.75 MIL/uL (ref 3.87–5.11)
RDW: 20.3 % — ABNORMAL HIGH (ref 11.5–15.5)
WBC Count: 7.1 K/uL (ref 4.0–10.5)
nRBC: 0 % (ref 0.0–0.2)

## 2023-10-10 LAB — VITAMIN B12: Vitamin B-12: 873 pg/mL (ref 180–914)

## 2023-10-10 LAB — FERRITIN: Ferritin: 17 ng/mL (ref 11–307)

## 2023-10-10 LAB — CEA (ACCESS): CEA (CHCC): 3.37 ng/mL (ref 0.00–5.00)

## 2023-10-18 ENCOUNTER — Other Ambulatory Visit: Payer: Self-pay

## 2023-10-19 DIAGNOSIS — J410 Simple chronic bronchitis: Secondary | ICD-10-CM | POA: Diagnosis not present

## 2023-10-19 DIAGNOSIS — J9601 Acute respiratory failure with hypoxia: Secondary | ICD-10-CM | POA: Diagnosis not present

## 2023-10-19 DIAGNOSIS — J449 Chronic obstructive pulmonary disease, unspecified: Secondary | ICD-10-CM | POA: Diagnosis not present

## 2023-10-22 ENCOUNTER — Inpatient Hospital Stay: Admitting: Hematology

## 2023-10-22 VITALS — BP 136/76 | HR 67 | Temp 97.8°F | Resp 15 | Ht 62.0 in | Wt 160.2 lb

## 2023-10-22 DIAGNOSIS — C19 Malignant neoplasm of rectosigmoid junction: Secondary | ICD-10-CM | POA: Diagnosis not present

## 2023-10-22 DIAGNOSIS — C183 Malignant neoplasm of hepatic flexure: Secondary | ICD-10-CM | POA: Diagnosis not present

## 2023-10-22 DIAGNOSIS — C20 Malignant neoplasm of rectum: Secondary | ICD-10-CM | POA: Diagnosis not present

## 2023-10-22 DIAGNOSIS — I10 Essential (primary) hypertension: Secondary | ICD-10-CM | POA: Diagnosis not present

## 2023-10-22 NOTE — Assessment & Plan Note (Addendum)
 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  Colon cancer MMR deficient, and a rectal cancer MMR proficient.  - She underwent right hemicolectomy on Jun 13, 2023. - Due to her advanced age and her medical, we decided to surveillance her rectal cancer. - I recommend a ctDNA guardianReveal, she declined  - Repeated her colonoscopy on July 31, 2023 showed no evidence of local recurrence

## 2023-10-22 NOTE — Progress Notes (Signed)
 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  Colon cancer MMR deficient, and a rectal cancer MMR proficient.  - She underwent right hemicolectomy on Jun 13, 2023. - Due to her advanced age and her medical, we decided to surveillance her rectal cancer. - I recommend a ctDNA guardianReveal, she declined  - Repeated her colonoscopy on July 31, 2023 showed no evidence of local recurrence   Assessment & Plan Rectal cancer, status post endoscopy resection, under active surveillance Rectal cancer previously resected with no evidence of recurrence on recent colonoscopy. Scar tissue biopsied with no cancerous findings. Small chance of residual cancer cells in the rectal wall, necessitating close monitoring. - Coordinate with Dr. Eartha for endoscopy every 3-6 months - Schedule colonoscopy with Dr. Eartha at the end of the year  Colon cancer, status post resection, under active surveillance Colon cancer previously resected with no evidence of recurrence. Tumor marker levels have  normalized, indicating a positive response post-surgery. - Order CT scan in March 2026, a week before follow-up appointment - Perform lab tests on the same day as CT scan  Pelvic lesion under surveillance  MRI showed a fluid collection or soft tissue between the rectum and tailbone. Lesion was present in previous CT scans with no significant growth, favoring a benign lesion. Radiology recommends further evaluation with contrast MRI to rule out malignancy. She prefers to delay MRI until late November to coincide with surgical consultation. - Order contrast MRI at the end of November 2025 - Coordinate MRI scheduling with Dr. 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 Iron studies show slight deficiency in saturation levels. - Encourage increased dietary meat intake - Resume iron supplementation  Plan - She is clinically doing well, lab and pelvic MRI images reviewed with patient, no suspicion for residual disease or recurrence - Continue cancer surveillance - Repeat pelvic MRI with and without contrast in late November, and that she will see Dr. Teresa in earlier December - Lab and follow-up with me in 6 months with surveillance CT chest, abdomen pelvis 1 week before - Will reach out to her GI Dr. Herschell regarding sigmoidoscopy surveillance for rectal cancer   SUMMARY OF ONCOLOGIC HISTORY: Oncology History  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)  03/23/2023 Imaging   CT CAP without contrast IMPRESSION: 1. Marked right-sided colonic wall thickening with relatively mild surrounding edema. Findings suspicious for colon carcinoma. Relatively focal infectious or ischemic colitis felt less likely. 2. Borderline ileocolic mesenteric adenopathy, suspicious for regional nodal metastasis. No evidence of distant metastasis. 3.  Infrarenal abdominal aortic aneurysm with mild extension into the right  common iliac. Recommend follow-up ultrasound every 3 years.  (Ref.: J Vasc Surg. 2018; 67:2-77 and J Am Coll Radiol 2013;10(10):789-794.) 4. 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 Differential considerations include liposarcoma, teratoma, or extramedullary hematopoiesis. This can be re-evaluated on follow-up CT at 6 months. 5. Incidental findings, including: Aortic atherosclerosis (ICD10-I70.0), coronary artery atherosclerosis and emphysema (ICD10-J43.9). Fat containing ventral abdominopelvic and right inguinal hernias   04/20/2023 Procedure   Endoscopy  Normal esophagus with a widely patent Schatzki ring.  There is a 1 cm hiatal hernia.  She has erosive gastropathy and a normal duodenum.   --Biopsies taken from the duodenum show chronic peptic duodenitis, negative for dysplasia.  Biopsies from the stomach show chronic, inactive gastritis.  Biopsies were  negative for H. pylori and negative for dysplasia.     04/20/2023 Procedure   Colonoscopy  Malignant, partially obstructing tumor at the hepatic flexure.  There were three 6 to 9 mm polyps in the transverse colon.  A 5 cm polyp in the rectum (15 cm from the anus) was removed piecemeal.  It was resected and retrieved.  A 2 cm polyp in the rectum (10 cm from the anus) was removed piecemeal.  It was resected and retrieved.  There were nonbleeding, internal hemorrhoids and diverticulosis in the sigmoid colon.   --Biopsy of the tumor at hepatic flexure was positive for invasive adenocarcinoma, moderately differentiated.  MMR is abnormal.   --Biopsies of the polyps from transverse colon showed tubular adenoma, negative for high-grade dysplasia.   --Biopsy of 5 cm polyp in the rectum, 15 cm from the anus, showed invasive adenocarcinoma, moderately differentiated.  It was arising from tubular adenoma with high-grade dysplasia.  MMR protein is preserved.   --Biopsy of 2 cm polyp, 10 cm from the anus, showed tubular  adenoma, negative for high-grade dysplasia or invasive carcinoma.   04/20/2023 Pathology Results   --Biopsy of the tumor at hepatic flexure was positive for invasive adenocarcinoma, moderately differentiated.  MMR is abnormal.   --Biopsies of the polyps from transverse colon showed tubular adenoma, negative for high-grade dysplasia.   --Biopsy of 5 cm polyp in the rectum, 15 cm from the anus, showed invasive adenocarcinoma, moderately differentiated.  It was arising from tubular adenoma with high-grade dysplasia.  MMR protein is preserved.   --Biopsy of 2 cm polyp, 10 cm from the anus, showed tubular adenoma, negative for high-grade dysplasia or invasive carcinoma.   04/25/2023 Initial Diagnosis   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)   Colorectal cancer, stage II (HCC)  04/25/2023 Initial Diagnosis   Cancer of rectum (HCC)   06/13/2023 Cancer Staging   Staging form: Colon and Rectum, AJCC 8th Edition - Pathologic stage from 06/13/2023: Stage IIA (pT3, pN0, cM0) - Signed by Lanny Callander, MD on 06/29/2023 Total positive nodes: 0 Histologic grading system: 4 grade system Histologic grade (G): G2 Residual tumor (R): R0      Discussed the use of AI scribe software for clinical note transcription with the patient, who gave verbal consent to proceed.  History of Present Illness Erin Morrison is a 73 year old female with rectal and colon cancer who presents for follow-up.  She has gained 20 pounds since her last visit, with a current weight of 160 pounds. Her appetite has increased, and she has no bowel movement issues.  A colonoscopy on July 1st showed scar tissue  without cancerous cells, and no recurrence of the rectal tumor was noted. An MRI on September 10th identified a fluid collection or soft tissue between the tailbone and rectum, not definitive for cancer. A follow-up MRI with contrast is recommended. This area was also noted in an April CT scan, measuring 6.3 to 6.4 cm,  with no significant growth.  Lab results indicate tumor markers have normalized from 207 to 3 over six months. Kidney and liver functions are normal. Anemia has improved, with hemoglobin at 11.8, and iron studies are mostly normal, though saturation is slightly low.     All other systems were reviewed with the patient and are negative.  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   Seen on last chest x-ray 12/2015, not on x-ray from 12/2015     Essential hypertension 06/08/2015   Former smoker 02/24/2022   GERD (gastroesophageal reflux disease) 10/24/2016   H/O steroid therapy 06/08/2015   Hyperlipidemia 12/06/2015   Hypertension    Low back pain    Lower back pain 04/08/2018   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 Denies   On home oxygen  therapy    Pneumonia    2012   Pre-operative respiratory examination 08/31/2020   Umbilical hernia    UTI (urinary tract infection) 04/10/2018    SURGICAL HISTORY: Past Surgical History:  Procedure Laterality Date   arthroscopic knee surgery Right    CARDIAC CATHETERIZATION N/A 03/03/2016   Procedure: Right/Left Heart  Cath and Coronary Angiography;  Surgeon: Victory LELON Sharps, MD;  Location: [facility] INVASIVE CV LAB;  Service: Cardiovascular;  Laterality: N/A;   COLON RESECTION Right 06/13/2023   Procedure: COLECTOMY, HAND-ASSISTED, LAPAROSCOPIC;  Surgeon: Teresa Lonni HERO, MD;  Location: [facility] ORS;  Service: General;  Laterality: Right;  LAPAROSCOPIC RIGHT HEMICOLECTOMY   COLONOSCOPY N/A 04/20/2023   Procedure: COLONOSCOPY;  Surgeon: Saintclair Jasper, MD;  Location: [facility] ENDOSCOPY;  Service: Gastroenterology;  Laterality: N/A;   ESOPHAGOGASTRODUODENOSCOPY N/A 04/20/2023   Procedure: EGD (ESOPHAGOGASTRODUODENOSCOPY);  Surgeon: Saintclair Jasper, MD;  Location: [facility] ENDOSCOPY;  Service: Gastroenterology;  Laterality: N/A;   HEMOSTASIS CLIP PLACEMENT  04/20/2023   Procedure: CONTROL OF HEMORRHAGE, GI TRACT, ENDOSCOPIC, BY CLIPPING OR OVERSEWING;  Surgeon: Saintclair Jasper, MD;  Location: [facility] ENDOSCOPY;  Service: Gastroenterology;;   POLYPECTOMY  04/20/2023   Procedure: POLYPECTOMY;  Surgeon: Saintclair Jasper, MD;  Location: [facility] ENDOSCOPY;  Service: Gastroenterology;;   TUBAL LIGATION     VENTRAL HERNIA REPAIR N/A 06/13/2023   Procedure: REPAIR, HERNIA, VENTRAL;  Surgeon: Teresa Lonni HERO, MD;  Location: [facility] ORS;  Service: General;  Laterality: N/A;  VENTRAL HERNIA REPAIR    I have reviewed the social history and family history with the patient and they are unchanged from previous note.  ALLERGIES:  has no known allergies.  MEDICATIONS:  Current Outpatient Medications  Medication Sig Dispense Refill   acetaminophen  (TYLENOL ) 500 MG tablet Take 1,000 mg by mouth 2 (two) times daily as needed for moderate pain (pain score 4-6) or headache.     albuterol  (PROVENTIL ) (2.5 MG/3ML) 0.083% nebulizer solution Take 2.5 mg by nebulization every 6 (six) hours as needed for wheezing or shortness of breath.     albuterol  (VENTOLIN  HFA) 108 (90 Base) MCG/ACT inhaler TAKE 2 PUFFS BY MOUTH EVERY 6 HOURS AS NEEDED FOR WHEEZE OR SHORTNESS OF BREATH 6.7 each 3    atorvastatin  (LIPITOR) 40 MG tablet TAKE 1 TABLET BY MOUTH EVERY DAY AT 6PM 15 tablet 0   budesonide -glycopyrrolate -formoterol  (BREZTRI  AEROSPHERE) 160-9-4.8 MCG/ACT AERO inhaler Inhale 2 puffs into the lungs in the morning and at bedtime. 10.7 each 11   cyanocobalamin  (VITAMIN B12) 500 MCG tablet 2 tablet Orally Once a day     escitalopram  (LEXAPRO ) 10 MG tablet Take 10 mg by mouth daily.     Ferrous Sulfate  (IRON PO) Take 25 mg by mouth daily.     methocarbamol  (ROBAXIN ) 500 MG tablet Take 1 tablet (500 mg total) by mouth 4 (four) times daily. 30 tablet 0   montelukast  (SINGULAIR ) 10 MG tablet Take 1 tablet (10 mg total) by mouth at bedtime. 90 tablet 3   roflumilast  (DALIRESP ) 500 MCG TABS tablet Take 1 tablet (500 mcg total) by mouth daily. 90 tablet 3   traMADol  (ULTRAM ) 50 MG tablet Take 1 tablet (50 mg total) by mouth every 6 (six) hours as needed for severe pain (pain score 7-10). 20 tablet 0   No current facility-administered medications for this visit.    PHYSICAL EXAMINATION: ECOG PERFORMANCE STATUS: 1 - Symptomatic but completely ambulatory  Vitals:   10/22/23 1302  BP: 136/76  Pulse: 67  Resp: 15  Temp: 97.8 F (36.6 C)  SpO2: 93%   Wt Readings from Last 3 Encounters:  10/22/23 160 lb 3.2 oz (72.7 kg)  09/20/23 154 lb 6.4 oz (70 kg)  06/29/23 139 lb 11.2 oz (63.4 kg)     GENERAL:alert, no distress and comfortable SKIN: skin color, texture, turgor are normal, no rashes or significant lesions EYES: normal, Conjunctiva are pink and non-injected, sclera clear NECK: supple, thyroid normal size, non-tender, without nodularity LYMPH:  no palpable lymphadenopathy in the cervical, axillary  ABDOMEN:abdomen soft, non-tender and normal bowel sounds Musculoskeletal:no cyanosis of digits and no clubbing  NEURO: alert & oriented x 3 with fluent speech, no focal motor/sensory deficits  Physical Exam   LABORATORY DATA:  I have reviewed the data as listed    Latest Ref Rng &  Units 10/10/2023   12:05 PM 08/14/2023    1:29 PM 06/29/2023    9:53 AM  CBC  WBC 4.0 - 10.5 K/uL 7.1  7.8  9.8   Hemoglobin 12.0 - 15.0 g/dL 88.1  9.4  9.3   Hematocrit 36.0 - 46.0 % 38.6  31.9  32.4   Platelets 150 - 400 K/uL 255  332  398         Latest Ref Rng & Units 10/10/2023   12:05 PM 08/14/2023    1:29 PM 06/29/2023    9:53 AM  CMP  Glucose 70 - 99 mg/dL 82  95  102   BUN 8 - 23 mg/dL 14  17  16    Creatinine 0.44 - 1.00 mg/dL 9.38  9.37  9.35   Sodium 135 - 145 mmol/L 141  141  140   Potassium 3.5 - 5.1 mmol/L 4.2  4.1  3.9   Chloride 98 - 111 mmol/L 104  105  103   CO2 22 - 32 mmol/L 32  29  29   Calcium  8.9 - 10.3 mg/dL 9.7  9.5  9.8   Total Protein 6.5 - 8.1 g/dL 7.3  7.2  7.3   Total Bilirubin 0.0 - 1.2 mg/dL 0.8  0.8  0.6   Alkaline Phos 38 - 126 U/L 72  77  83   AST 15 - 41 U/L 14  15  15    ALT 0 - 44 U/L 15  17  14        RADIOGRAPHIC STUDIES: I have personally reviewed the radiological images as listed and agreed with the findings in the report. No results found.    Orders Placed This Encounter  Procedures   CT CHEST ABDOMEN PELVIS W CONTRAST    Standing Status:   Future    Expected Date:   04/14/2024    Expiration Date:   10/21/2024    If indicated for the ordered procedure, I authorize the administration of contrast media per Radiology protocol:   Yes    Does the patient have a contrast media/X-ray dye allergy ?:   No    Preferred imaging location?:   [facility]    If indicated for the ordered procedure, I authorize the administration of oral contrast media per Radiology protocol:   Yes   MR Pelvis W Wo Contrast    Standing Status:   Future    Expected Date:   12/28/2023    Expiration Date:   10/21/2024    If indicated for the ordered procedure, I authorize the administration of contrast media per Radiology protocol:   Yes    What is the patient's sedation requirement?:   No Sedation    Does the patient have a pacemaker or implanted devices?:    No    Preferred imaging location?:   [facility] (table limit - 500lbs)   All questions were answered. The patient knows to call the clinic with any problems, questions or concerns. No barriers to learning was detected. The total time spent in the appointment was 30 minutes, including review of chart and various tests results, discussions about plan of care and coordination of care plan     Onita Mattock, MD 10/22/2023

## 2023-10-26 ENCOUNTER — Other Ambulatory Visit: Payer: Self-pay

## 2023-11-12 ENCOUNTER — Telehealth: Payer: Self-pay | Admitting: Primary Care

## 2023-11-12 NOTE — Telephone Encounter (Signed)
 Fax received from Dr. Estelita with [facility] to perform a colonoscopy on patient.  Patient needs surgery clearance. Surgery is pending. Patient was seen on 09/20/23. Office protocol is a risk assessment can be sent to surgeon if patient has been seen in 60 days or less.   Sending to [facility] for risk assessment or recommendations if patient needs to be seen in office prior to surgical procedure.

## 2023-11-15 NOTE — Telephone Encounter (Signed)
 Copy of this encounter faxed to [facility]

## 2023-11-15 NOTE — Telephone Encounter (Signed)
 She would be low-intermediate risk due to age, COPD and chronic respiratory failure which was well controlled during her visit in August. She is on oxygen  at 2L continuously. Cleared from a pulmonary standpoint.

## 2023-11-18 DIAGNOSIS — J9601 Acute respiratory failure with hypoxia: Secondary | ICD-10-CM | POA: Diagnosis not present

## 2023-11-18 DIAGNOSIS — J410 Simple chronic bronchitis: Secondary | ICD-10-CM | POA: Diagnosis not present

## 2023-12-19 DIAGNOSIS — J9601 Acute respiratory failure with hypoxia: Secondary | ICD-10-CM | POA: Diagnosis not present

## 2023-12-19 DIAGNOSIS — J410 Simple chronic bronchitis: Secondary | ICD-10-CM | POA: Diagnosis not present

## 2023-12-19 DIAGNOSIS — J449 Chronic obstructive pulmonary disease, unspecified: Secondary | ICD-10-CM | POA: Diagnosis not present

## 2023-12-26 ENCOUNTER — Other Ambulatory Visit: Payer: Self-pay

## 2023-12-26 ENCOUNTER — Telehealth: Payer: Self-pay | Admitting: Primary Care

## 2023-12-26 DIAGNOSIS — C19 Malignant neoplasm of rectosigmoid junction: Secondary | ICD-10-CM

## 2023-12-26 DIAGNOSIS — C20 Malignant neoplasm of rectum: Secondary | ICD-10-CM

## 2023-12-26 DIAGNOSIS — C183 Malignant neoplasm of hepatic flexure: Secondary | ICD-10-CM

## 2023-12-26 NOTE — Telephone Encounter (Signed)
 Needs OV, see if joan as openings in [place] for pre-op risk or you can use one of my same day apt slots

## 2023-12-26 NOTE — Telephone Encounter (Signed)
 Fax received from Dr. Saintclair Jasper with [facility] to perform a colonoscopy with propofol  on patient.  Patient needs surgery clearance. Procedure is scheduled for 01/07/24. Patient was seen on 09/20/23. Office protocol is a risk assessment can be sent to surgeon if patient has been seen in 60 days or less.   Sending to [facility] for risk assessment or recommendations if patient needs to be seen in office prior to surgical procedure.

## 2023-12-28 ENCOUNTER — Ambulatory Visit (HOSPITAL_COMMUNITY)
Admission: RE | Admit: 2023-12-28 | Discharge: 2023-12-28 | Disposition: A | Source: Ambulatory Visit | Attending: Hematology

## 2023-12-28 DIAGNOSIS — C183 Malignant neoplasm of hepatic flexure: Secondary | ICD-10-CM | POA: Diagnosis not present

## 2023-12-28 DIAGNOSIS — C20 Malignant neoplasm of rectum: Secondary | ICD-10-CM | POA: Insufficient documentation

## 2023-12-28 DIAGNOSIS — C19 Malignant neoplasm of rectosigmoid junction: Secondary | ICD-10-CM | POA: Diagnosis not present

## 2023-12-28 DIAGNOSIS — K5730 Diverticulosis of large intestine without perforation or abscess without bleeding: Secondary | ICD-10-CM | POA: Diagnosis not present

## 2024-01-01 NOTE — Telephone Encounter (Signed)
 I called and spoke with the pt and got her scheduled with [facility] for 12/1023  Her procedure has been moved to 02/06/24 Routing back to clearance pool for now

## 2024-01-09 ENCOUNTER — Encounter (HOSPITAL_BASED_OUTPATIENT_CLINIC_OR_DEPARTMENT_OTHER): Payer: Self-pay

## 2024-01-09 ENCOUNTER — Ambulatory Visit (HOSPITAL_BASED_OUTPATIENT_CLINIC_OR_DEPARTMENT_OTHER)

## 2024-01-09 VITALS — BP 177/75 | HR 70 | Ht 62.0 in | Wt 173.0 lb

## 2024-01-09 DIAGNOSIS — Z01811 Encounter for preprocedural respiratory examination: Secondary | ICD-10-CM | POA: Diagnosis not present

## 2024-01-09 DIAGNOSIS — Z87891 Personal history of nicotine dependence: Secondary | ICD-10-CM

## 2024-01-09 DIAGNOSIS — J9611 Chronic respiratory failure with hypoxia: Secondary | ICD-10-CM | POA: Diagnosis not present

## 2024-01-09 DIAGNOSIS — J449 Chronic obstructive pulmonary disease, unspecified: Secondary | ICD-10-CM | POA: Diagnosis not present

## 2024-01-09 MED ORDER — ALBUTEROL SULFATE HFA 108 (90 BASE) MCG/ACT IN AERS: 2.0000 | INHALATION_SPRAY | RESPIRATORY_TRACT | 3 refills | Status: AC | PRN

## 2024-01-09 NOTE — Progress Notes (Signed)
 @Patient  ID: Erin Morrison, female    DOB: 1950/10/17, 73 y.o.   MRN: [id number]  Chief Complaint  Patient presents with   surgical clearance    Referring provider: Montey Lot, PA-C  HPI: Discussed the use of AI scribe software for clinical note transcription with the patient, who gave verbal consent to proceed.  History of Present Illness Erin Morrison is a 73 year old female who presents for pre-colonoscopy evaluation. She is accompanied by her husband, [name].  She is scheduled for a colonoscopy on January 7th and is here for a pre-procedure evaluation. She feels good overall with no recent respiratory infections or changes in her ability to perform regular activities.  She has a history of respiratory issues and uses several medications to manage her condition. Her current medications include Breztri , two puffs twice a day, Singulair  10 mg at bedtime, and Daliresp  500 mg in the morning. She also has albuterol  available in both a nebulizer and an inhaler form, but she no longer uses the nebulizer and seldom uses the inhaler. She requested a refill for the albuterol  inhaler today.  No fever, chills, worsening shortness of breath, pain, palpitations, or any new symptoms. Her last lab work was done in September 2025.  Last OV 09/20/2023: Erin Morrison is a 73 year old female with COPD who presents for a six-month follow-up.   She is on a medication regimen that includes Breztri , Daliresp , Singulair , and albuterol  as needed, which she finds effective. She has not experienced any COPD exacerbations, hospitalizations, or increased oxygen  requirements. She uses two liters of oxygen , primarily when sitting, and has not noticed any low oxygen  readings. She has a portable concentrator and regularly monitors her oxygen  levels.   She underwent hernia repair in May without complications. Additionally, she had a hemicolectomy and two colonoscopies, along with two CT scans, and is  scheduled for an MRI on September 10th.   In terms of respiratory symptoms, she reports no shortness of breath, daily cough, or wheezing. She has not needed to use her albuterol  inhaler recently.   Her immunization history includes receiving the pneumococcal vaccine in 2013 and 2015. She has not received the RSV vaccine.   Significant tests/ events reviewed   03/2020 Oxygen  saturation dropped to 87% on first lap and recovered to 92% on 2 L pulse   HRCT 11/2017 moderate emphysema, mild scarring at lung bases, postinflammatory, no ILD   PFTs 06/2021 moderate airway obstruction, ratio 60, FEV1 61%, FVC 76%, no bronchodilator response, TLC 128% consistent with hyperinflation, DLCO 11.2/60%   PFTs 03/2020 ratio 63, FEV1 65%, TLC 100%, DLCO 83% -Stable compared to 2018   PFT 03/2016: FVC 2.28 L (80%),FEV1 1.49 L (69%), ratio 66 sig bronchodilator response NO DLCO 07/2015: FVC 2.34 L (82%) FEV1 1.46 L (67%) ratio 63 sig bronchodilator response DLCO uncorrected 62%   Alpha-1 antitrypsin: MM (169)    No Known Allergies  Immunization History  Administered Date(s) Administered   Fluad Quad(high Dose 65+) 11/25/2018, 10/29/2020, 10/24/2021   Fluad Trivalent(High Dose 65+) 12/18/2022   INFLUENZA, HIGH DOSE SEASONAL PF 10/24/2016   Influenza Split 10/18/2015   Influenza,inj,Quad PF,6+ Mos 09/19/2017   PFIZER(Purple Top)SARS-COV-2 Vaccination 08/26/2019, 09/16/2019   PNEUMOCOCCAL CONJUGATE-20 09/20/2023   Pfizer(Comirnaty)Fall Seasonal Vaccine 12 years and older 12/18/2022   Pneumococcal Conjugate-13 01/30/2013   Pneumococcal Polysaccharide-23 02/27/2011    Past Medical History:  Diagnosis Date   Acute DVT (deep venous thrombosis) (HCC)    Allergic rhinitis  Anemia    Anxiety    Atrophic vaginitis    CAD in native artery 04/17/2017   Cancer of rectum (HCC) 04/25/2023   Chest pain 01/13/2016   Chronic allergic rhinitis 01/13/2016   Chronic respiratory failure with hypoxia (HCC)  05/03/2017   COPD (chronic obstructive pulmonary disease) (HCC)    COPD exacerbation (HCC) 12/03/2015   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   Seen on last chest x-ray 12/2015, not on x-ray from 12/2015     Essential hypertension 06/08/2015   Former smoker 02/24/2022   GERD (gastroesophageal reflux disease) 10/24/2016   H/O steroid therapy 06/08/2015   Hyperlipidemia 12/06/2015   Hypertension    Low back pain    Lower back pain 04/08/2018   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 Denies   On home oxygen  therapy    Pneumonia    2012   Pre-operative respiratory examination 08/31/2020   Umbilical hernia    UTI (urinary tract infection) 04/10/2018    Tobacco History: Social History   Tobacco Use  Smoking Status Former   Current packs/day: 0.00   Average packs/day: 1.5 packs/day for 46.0 years (69.0 ttl pk-yrs)   Types: Cigarettes   Start date: 01/31/1964   Quit date: 01/30/2010   Years since quitting: 13.9  Smokeless Tobacco Never   Counseling given: Not Answered   Outpatient Medications Prior to Visit  Medication Sig Dispense Refill   acetaminophen  (TYLENOL ) 500 MG tablet Take 1,000 mg by mouth 2 (two) times daily as needed for moderate pain (pain score 4-6) or headache.     albuterol  (PROVENTIL ) (2.5 MG/3ML) 0.083% nebulizer solution Take 2.5 mg by nebulization every 6 (six) hours as needed for wheezing or shortness of breath.     atorvastatin  (LIPITOR) 40 MG tablet TAKE 1 TABLET BY MOUTH EVERY DAY AT 6PM 15 tablet 0   budesonide -glycopyrrolate -formoterol  (BREZTRI  AEROSPHERE) 160-9-4.8 MCG/ACT  AERO inhaler Inhale 2 puffs into the lungs in the morning and at bedtime. 10.7 each 11   cyanocobalamin  (VITAMIN B12) 500 MCG tablet 2 tablet Orally Once a day     escitalopram  (LEXAPRO ) 10 MG tablet Take 10 mg by mouth daily.     Ferrous Sulfate  (IRON PO) Take 25 mg by mouth daily.     montelukast  (SINGULAIR ) 10 MG tablet Take 1 tablet (10 mg total) by mouth at bedtime. 90 tablet 3   roflumilast  (DALIRESP ) 500 MCG TABS tablet Take 1 tablet (500 mcg total) by mouth daily. 90 tablet 3   albuterol  (VENTOLIN  HFA) 108 (90 Base) MCG/ACT inhaler TAKE 2 PUFFS BY MOUTH EVERY 6 HOURS AS NEEDED FOR WHEEZE OR SHORTNESS OF BREATH 6.7 each 3   methocarbamol  (ROBAXIN ) 500 MG tablet Take 1 tablet (500 mg total) by mouth 4 (four) times daily. 30 tablet 0   traMADol  (ULTRAM ) 50 MG tablet Take 1 tablet (50 mg total) by mouth every 6 (six) hours as needed for severe pain (pain score 7-10). 20 tablet 0   No facility-administered medications prior to visit.     Review of Systems: as  per HPI  Constitutional:   No  weight loss, night sweats,  Fevers, chills, fatigue, or  lassitude.  HEENT:   No headaches,  Difficulty swallowing,  Tooth/dental problems, or  Sore throat,                No sneezing, itching, ear ache, nasal congestion, post nasal drip,   CV:  No chest pain,  Orthopnea, PND, swelling in lower extremities, anasarca, dizziness, palpitations, syncope.   GI  No heartburn, indigestion, abdominal pain, nausea, vomiting, diarrhea, change in bowel habits, loss of appetite, bloody stools.   Resp: No shortness of breath with exertion or at rest.  No excess mucus, no productive cough,  No non-productive cough,  No coughing up of blood.  No change in color of mucus.  No wheezing.  No chest wall deformity  Skin: no rash or lesions.  GU: no dysuria, change in color of urine, no urgency or frequency.  No flank pain, no hematuria   MS:  No joint pain or swelling.  No decreased range of motion.  No back  pain.    Physical Exam  BP (!) 177/75   Pulse 70   Ht 5' 2 (1.575 m)   Wt 173 lb (78.5 kg)   SpO2 90% Comment: 2 liters  BMI 31.64 kg/m   Repeat SpO2 93% on 2 lpm [place]  GEN: A/Ox3; pleasant , NAD, well nourished, wearing oxygen  via nasal cannula   HEENT:  [place]/AT,  EACs-clear, TMs-wnl, NOSE-clear, THROAT-clear, no lesions, no postnasal drip or exudate noted.   NECK:  Supple w/ fair ROM; no JVD; normal carotid impulses w/o bruits; no thyromegaly or nodules palpated; no lymphadenopathy.    RESP  Clear  P & A; w/o, wheezes/ rales/ or rhonchi. no accessory muscle use, no dullness to percussion  CARD:  RRR, no m/r/g, no peripheral edema, pulses intact, no cyanosis or clubbing.  GI:   Soft & nt; nml bowel sounds; no organomegaly or masses detected.   Musco: Warm bil, no deformities or joint swelling noted.   Neuro: alert, no focal deficits noted.    Skin: Warm, no lesions or rashes    Lab Results:  CBC    Component Value Date/Time   WBC 7.1 10/10/2023 1205   WBC 12.3 (H) 06/16/2023 0523   RBC 4.75 10/10/2023 1205   HGB 11.8 (L) 10/10/2023 1205   HGB 14.0 04/18/2017 1048   HCT 38.6 10/10/2023 1205   HCT 42.0 04/18/2017 1048   PLT 255 10/10/2023 1205   PLT 319 04/18/2017 1048   MCV 81.3 10/10/2023 1205   MCV 90 04/18/2017 1048   MCV 94 02/26/2011 0424   MCH 24.8 (L) 10/10/2023 1205   MCHC 30.6 10/10/2023 1205   RDW 20.3 (H) 10/10/2023 1205   RDW 13.1 04/18/2017 1048   RDW 13.5 02/26/2011 0424   LYMPHSABS 2.1 10/10/2023 1205   LYMPHSABS 1.6 02/24/2016 1230   LYMPHSABS 0.6 (L) 02/26/2011 0424   MONOABS 0.6 10/10/2023 1205   MONOABS 0.1 02/26/2011 0424   EOSABS 0.3 10/10/2023 1205   EOSABS 0.1 02/24/2016 1230   EOSABS 0.0 02/26/2011 0424   BASOSABS 0.0 10/10/2023 1205   BASOSABS 0.0 02/24/2016 1230   BASOSABS 0.0 02/26/2011 0424    BMET    Component Value Date/Time   NA 141 10/10/2023 1205   NA 141 04/18/2017 1048   NA 146 (H) 02/26/2011 0424   K 4.2  10/10/2023 1205   K 3.7 02/26/2011 0424   CL  104 10/10/2023 1205   CL 112 (H) 02/26/2011 0424   CO2 32 10/10/2023 1205   CO2 23 02/26/2011 0424   GLUCOSE 82 10/10/2023 1205   GLUCOSE 137 (H) 02/26/2011 0424   BUN 14 10/10/2023 1205   BUN 13 04/18/2017 1048   BUN 12 02/26/2011 0424   CREATININE 0.61 10/10/2023 1205   CREATININE 0.58 (L) 02/26/2011 0424   CALCIUM  9.7 10/10/2023 1205   CALCIUM  9.3 02/26/2011 0424   GFRNONAA >60 10/10/2023 1205   GFRNONAA >60 02/26/2011 0424   GFRAA 103 04/18/2017 1048   GFRAA >60 02/26/2011 0424    BNP No results found for: BNP  ProBNP    Component Value Date/Time   PROBNP 45 04/18/2017 1048    Imaging: MR PELVIS WO CM RECTAL CA STAGING Result Date: 12/28/2023 EXAM: MR RECTAL TECHNIQUE: Multiplanar multisequence MR imaging of the pelvis was performed. No intravenous contrast was administered. Ultrasound gel was administered per rectum to optimize tumor evaluation. COMPARISON: None Available. CLINICAL HISTORY: Rectal cancer, stage II/III/IV, monitor * Tracking Code: RSE *. FINDINGS: TUMOR LOCATION No residual or recurrent tumor is identified within the rectum. TUMOR DESCRIPTION No residual or recurrent tumor is identified within the rectum. T - CATEGORY No residual or recurrent tumor is identified within the rectum. No evidence of extension of tumor beyond the muscularis propria. N - CATEGORY There are no suspicious lymph nodes in the rectum mesocolon. No iliac lymphadenopathy. Other: The uterus and bladder are normal. Incidental finding of sigmoid colon diverticulosis. IMPRESSION: 1. No evidence of rectal carcinoma recurrence. 2. No evidence of metastatic adenopathy in the anterior pelvis. Electronically signed by: Norleen Boxer MD 12/28/2023 02:42 PM EST RP Workstation: [id number]    Administration History     None          Latest Ref Rng & Units 07/15/2021    1:48 PM 03/25/2020    1:51 PM 03/23/2016    8:37 AM 07/13/2015   10:47 AM  PFT  Results  FVC-Pre L 2.03  2.13  1.90  2.09   FVC-Predicted Pre % 76  78  67  73   FVC-Post L 2.08  2.23  2.28  2.34   FVC-Predicted Post % 78  81  80  82   Pre FEV1/FVC % % 60  63  65  69   Post FEV1/FCV % % 63  64  66  63   FEV1-Pre L 1.22  1.34  1.23  1.45   FEV1-Predicted Pre % 61  65  57  66   FEV1-Post L 1.31  1.43  1.49  1.46   DLCO uncorrected ml/min/mmHg 11.17  15.10   13.52   DLCO UNC% % 62  83   62   DLCO corrected ml/min/mmHg 11.17  15.10     DLCO COR %Predicted % 62  83     DLVA Predicted % 74  93   69   TLC L 6.10  4.76   5.56   TLC % Predicted % 128  100   117   RV % Predicted % 164  122   153     No results found for: NITRICOXIDE   Assessment & Plan:   Assessment & Plan Pre-operative respiratory examination  Moderate COPD (chronic obstructive pulmonary disease) (HCC)  Chronic respiratory failure with hypoxia (HCC)  Former smoker  Assessment and Plan Assessment & Plan Preoperative pulmonary risk assessment for colonoscopy Low risk for pulmonary complications post-colonoscopy due to well-managed respiratory status and absence  of recent respiratory infections. Anesthesia for colonoscopy is light, minimizing risk. - Provided preoperative pulmonary risk assessment for colonoscopy. - Ensure oxygen  support during the procedure. - Encourage early mobilization and deep breathing post-procedure.  Chronic obstructive pulmonary disease Well-managed with no recent exacerbations or respiratory infections. Current medications include Breztri , albuterol , Singulair , and Daliresp , which are effective in maintaining respiratory function. - Continue Breztri  two puffs twice a day. - Continue albuterol  as needed. - Continue Singulair  10 mg at bedtime. - Continue Daliresp  500 mg in the morning. - Refilled albuterol  inhaler.  ARISCAT scoring for postoperative pulmonary complications:  Low risk 1.6% risk of in-hospital post-op pulmonary complications (composite including  respiratory failure, respiratory infection, pleural effusion, atelectasis, pneumothorax, bronchospasm, aspiration pneumonitis)   Plan for supportive oxygen  as per baseline, early ambulation, coughing and deep breathing to minimize risk of postoperative pulmonary complications.  Continue Breztri  twice daily, Singulair  daily, Daliresp  daily; use Albuterol  2 puffs every 4-6 hours as needed.  Refill for Albuterol  MDI sent to pharmacy of choice. Return in about 6 months (around 07/09/2024) for COPD.  Candis Dandy, PA-C 01/09/2024

## 2024-01-09 NOTE — Assessment & Plan Note (Signed)
°  Assessment and Plan Assessment & Plan Preoperative pulmonary risk assessment for colonoscopy Low risk for pulmonary complications post-colonoscopy due to well-managed respiratory status and absence of recent respiratory infections. Anesthesia for colonoscopy is light, minimizing risk. - Provided preoperative pulmonary risk assessment for colonoscopy. - Ensure oxygen  support during the procedure. - Encourage early mobilization and deep breathing post-procedure.  Chronic obstructive pulmonary disease Well-managed with no recent exacerbations or respiratory infections. Current medications include Breztri , albuterol , Singulair , and Daliresp , which are effective in maintaining respiratory function. - Continue Breztri  two puffs twice a day. - Continue albuterol  as needed. - Continue Singulair  10 mg at bedtime. - Continue Daliresp  500 mg in the morning. - Refilled albuterol  inhaler.

## 2024-01-09 NOTE — Patient Instructions (Addendum)
 ARISCAT scoring for postoperative pulmonary complications:  Low risk 1.6% risk of in-hospital post-op pulmonary complications (composite including respiratory failure, respiratory infection, pleural effusion, atelectasis, pneumothorax, bronchospasm, aspiration pneumonitis)   Plan for supportive oxygen  as per baseline, early ambulation, coughing and deep breathing to minimize risk of postoperative pulmonary complications.  Continue Breztri  twice daily, Singulair  daily, Daliresp  daily; use Albuterol  2 puffs every 4-6 hours as needed.  Refill for Albuterol  MDI sent to pharmacy of choice.

## 2024-01-11 NOTE — Telephone Encounter (Signed)
 [name]'s note from 01/09/24 has been faxed to [facility].

## 2024-01-12 ENCOUNTER — Ambulatory Visit: Payer: Self-pay | Admitting: Hematology

## 2024-01-17 NOTE — Telephone Encounter (Addendum)
 Called patient to relay the below test result as per Dr. Lanny, patient voiced full understanding and had no further questions at this time.  ----- Message from Onita Lanny, MD sent at 01/12/2024  7:36 PM EST ----- Erin Morrison/Erin Morrison, please let pt know her MRI result, NED.  [place], I think she is due to see you in early Dec. But I do not see her appointment with you.   Thanks  Onita

## 2024-01-18 DIAGNOSIS — J449 Chronic obstructive pulmonary disease, unspecified: Secondary | ICD-10-CM | POA: Diagnosis not present

## 2024-01-18 DIAGNOSIS — J9601 Acute respiratory failure with hypoxia: Secondary | ICD-10-CM | POA: Diagnosis not present

## 2024-01-18 DIAGNOSIS — J410 Simple chronic bronchitis: Secondary | ICD-10-CM | POA: Diagnosis not present

## 2024-03-05 LAB — MOLECULAR PATHOLOGY

## 2024-04-21 ENCOUNTER — Inpatient Hospital Stay: Admitting: Hematology

## 2024-04-21 ENCOUNTER — Inpatient Hospital Stay
